# Patient Record
Sex: Male | Born: 1940
Health system: Southern US, Community
[De-identification: ages and names within clinical notes are randomized; demographics above are authoritative.]

## PROBLEM LIST (undated history)

## (undated) DIAGNOSIS — I509 Heart failure, unspecified: Secondary | ICD-10-CM

## (undated) DIAGNOSIS — F329 Major depressive disorder, single episode, unspecified: Secondary | ICD-10-CM

## (undated) DIAGNOSIS — B019 Varicella without complication: Secondary | ICD-10-CM

## (undated) DIAGNOSIS — I10 Essential (primary) hypertension: Secondary | ICD-10-CM

## (undated) DIAGNOSIS — Z9189 Other specified personal risk factors, not elsewhere classified: Secondary | ICD-10-CM

## (undated) DIAGNOSIS — R32 Unspecified urinary incontinence: Secondary | ICD-10-CM

## (undated) DIAGNOSIS — E785 Hyperlipidemia, unspecified: Secondary | ICD-10-CM

## (undated) DIAGNOSIS — M1990 Unspecified osteoarthritis, unspecified site: Secondary | ICD-10-CM

## (undated) DIAGNOSIS — G709 Myoneural disorder, unspecified: Secondary | ICD-10-CM

## (undated) DIAGNOSIS — R011 Cardiac murmur, unspecified: Secondary | ICD-10-CM

## (undated) DIAGNOSIS — I350 Nonrheumatic aortic (valve) stenosis: Secondary | ICD-10-CM

## (undated) DIAGNOSIS — E119 Type 2 diabetes mellitus without complications: Secondary | ICD-10-CM

## (undated) DIAGNOSIS — F32A Depression, unspecified: Secondary | ICD-10-CM

## (undated) HISTORY — DX: Major depressive disorder, single episode, unspecified: F32.9

## (undated) HISTORY — DX: Unspecified urinary incontinence: R32

## (undated) HISTORY — DX: Depression, unspecified: F32.A

## (undated) HISTORY — DX: Myoneural disorder, unspecified: G70.9

## (undated) HISTORY — DX: Type 2 diabetes mellitus without complications: E11.9

## (undated) HISTORY — DX: Unspecified osteoarthritis, unspecified site: M19.90

## (undated) HISTORY — DX: Hyperlipidemia, unspecified: E78.5

## (undated) HISTORY — DX: Other specified personal risk factors, not elsewhere classified: Z91.89

## (undated) HISTORY — DX: Essential (primary) hypertension: I10

## (undated) HISTORY — DX: Varicella without complication: B01.9

## (undated) HISTORY — DX: Cardiac murmur, unspecified: R01.1

---

## 2006-03-15 HISTORY — PX: OTHER SURGICAL HISTORY: SHX169

## 2013-03-09 ENCOUNTER — Emergency Department: Payer: Self-pay | Admitting: Emergency Medicine

## 2013-05-31 ENCOUNTER — Telehealth: Payer: Self-pay

## 2013-05-31 NOTE — Telephone Encounter (Signed)
I typically don't prescribe oxycodone. I would refer him to pain management if he wanted to see me

## 2013-05-31 NOTE — Telephone Encounter (Signed)
Pt left v/m; pt has scheduled new pt appt on 06/19/13 with Nicki Reaperegina Baity NP and pt wants to make sure that she will prescribe oxycodone; pt has been on oxycodone for 7 years for ruptured disc in back, pain in lt leg and the doctor that used to prescribe med is too far for pt to travel to. Pt  Request cb.

## 2013-06-01 NOTE — Telephone Encounter (Signed)
I have to see him before I can refer him

## 2013-06-01 NOTE — Telephone Encounter (Signed)
Pt states that he would like a referral and I did let pt know that someone will be calling him to make appt

## 2013-06-01 NOTE — Telephone Encounter (Signed)
Left message on voicemail for pt to return call  

## 2013-06-16 ENCOUNTER — Emergency Department: Payer: Self-pay | Admitting: Emergency Medicine

## 2013-06-19 ENCOUNTER — Ambulatory Visit: Payer: Self-pay | Admitting: Internal Medicine

## 2013-09-12 ENCOUNTER — Emergency Department: Payer: Self-pay | Admitting: Emergency Medicine

## 2013-09-12 LAB — URINALYSIS, COMPLETE
BILIRUBIN, UR: NEGATIVE
Bacteria: NONE SEEN
Blood: NEGATIVE
Glucose,UR: NEGATIVE mg/dL (ref 0–75)
Hyaline Cast: 2
Leukocyte Esterase: NEGATIVE
Nitrite: NEGATIVE
PH: 5 (ref 4.5–8.0)
Protein: 30
SPECIFIC GRAVITY: 1.026 (ref 1.003–1.030)
Squamous Epithelial: 1

## 2013-09-12 LAB — COMPREHENSIVE METABOLIC PANEL
Albumin: 4.2 g/dL (ref 3.4–5.0)
Alkaline Phosphatase: 75 U/L
Anion Gap: 8 (ref 7–16)
BUN: 15 mg/dL (ref 7–18)
Bilirubin,Total: 0.6 mg/dL (ref 0.2–1.0)
CO2: 27 mmol/L (ref 21–32)
Calcium, Total: 9.4 mg/dL (ref 8.5–10.1)
Chloride: 105 mmol/L (ref 98–107)
Creatinine: 1.01 mg/dL (ref 0.60–1.30)
EGFR (Non-African Amer.): >60
Glucose: 144 mg/dL — ABNORMAL HIGH (ref 65–99)
Osmolality: 283 (ref 275–301)
Potassium: 3.6 mmol/L (ref 3.5–5.1)
SGOT(AST): 35 U/L (ref 15–37)
SGPT (ALT): 26 U/L (ref 12–78)
SODIUM: 140 mmol/L (ref 136–145)
TOTAL PROTEIN: 8.2 g/dL (ref 6.4–8.2)

## 2013-09-12 LAB — CBC
HCT: 38 % — ABNORMAL LOW (ref 40.0–52.0)
HGB: 12.8 g/dL — AB (ref 13.0–18.0)
MCH: 30.6 pg (ref 26.0–34.0)
MCHC: 33.7 g/dL (ref 32.0–36.0)
MCV: 91 fL (ref 80–100)
Platelet: 160 10*3/uL (ref 150–440)
RBC: 4.19 10*6/uL — ABNORMAL LOW (ref 4.40–5.90)
RDW: 14 % (ref 11.5–14.5)
WBC: 8.5 10*3/uL (ref 3.8–10.6)

## 2013-09-12 LAB — TROPONIN I: Troponin-I: 0.05 ng/mL

## 2013-09-12 LAB — PROTIME-INR
INR: 0.9
PROTHROMBIN TIME: 12.5 s (ref 11.5–14.7)

## 2013-09-12 LAB — CK: CK, TOTAL: 270 U/L

## 2013-09-28 ENCOUNTER — Encounter: Payer: Self-pay | Admitting: Internal Medicine

## 2013-09-28 ENCOUNTER — Ambulatory Visit (INDEPENDENT_AMBULATORY_CARE_PROVIDER_SITE_OTHER): Payer: Medicare HMO | Admitting: Internal Medicine

## 2013-09-28 VITALS — BP 132/84 | HR 76 | Temp 97.5°F | Ht 70.0 in | Wt 256.2 lb

## 2013-09-28 DIAGNOSIS — G3184 Mild cognitive impairment, so stated: Secondary | ICD-10-CM | POA: Insufficient documentation

## 2013-09-28 DIAGNOSIS — I10 Essential (primary) hypertension: Secondary | ICD-10-CM | POA: Insufficient documentation

## 2013-09-28 DIAGNOSIS — E785 Hyperlipidemia, unspecified: Secondary | ICD-10-CM | POA: Insufficient documentation

## 2013-09-28 DIAGNOSIS — K219 Gastro-esophageal reflux disease without esophagitis: Secondary | ICD-10-CM | POA: Insufficient documentation

## 2013-09-28 DIAGNOSIS — G8929 Other chronic pain: Secondary | ICD-10-CM | POA: Insufficient documentation

## 2013-09-28 DIAGNOSIS — M549 Dorsalgia, unspecified: Secondary | ICD-10-CM

## 2013-09-28 DIAGNOSIS — E1129 Type 2 diabetes mellitus with other diabetic kidney complication: Secondary | ICD-10-CM | POA: Insufficient documentation

## 2013-09-28 DIAGNOSIS — E119 Type 2 diabetes mellitus without complications: Secondary | ICD-10-CM

## 2013-09-28 MED ORDER — OXYCODONE HCL 10 MG PO TABS: 10.0000 mg | ORAL_TABLET | Freq: Three times a day (TID) | ORAL | Status: DC

## 2013-09-28 NOTE — Assessment & Plan Note (Signed)
On aricept Would like to get records from previous provider to review before changing medications

## 2013-09-28 NOTE — Assessment & Plan Note (Signed)
No issues on prilosec 

## 2013-09-28 NOTE — Progress Notes (Signed)
HPI                            Pt presents to the clinic today to establish care. He is transferring care but can't remember the name of the practice. He reports that he is transferring care because he no longer drives and he needs to be closer to the place where he gets his pain medication from. He reports he has been taking the oxycodone for the last 7 years for his back and his knees. He is out and does need a refill today.  Additionally, he reports to me that he does have diabetes but that it is diet controlled. However, he is on Metformin BID. He also reports that no one has ever told him that he has high blood pressure or high cholesterol, despite taking multiple medications for the two above conditions.   His biggest concern today is his memory impairment. It seems to be getting worse. He is aricept daily. He reports one doctor told him he had alzheimer's, one doctor told him he did not. He is not sure why his memory is so bad at his age. He would like some clearer answers.   Past Medical History  Diagnosis Date  . Arthritis   . Chicken pox   . Diabetes mellitus without complication   . Heart murmur   . Hypertension   . Hyperlipidemia   . Urinary incontinence     Current Outpatient Prescriptions  Medication Sig Dispense Refill  . amLODipine (NORVASC) 5 MG tablet Take 5 mg by mouth daily.      Marland Kitchen B-Complex CAPS Take 1 capsule by mouth daily.      Marland Kitchen donepezil (ARICEPT) 10 MG tablet Take 10 mg by mouth at bedtime.      . gabapentin (NEURONTIN) 400 MG capsule Take 400 mg by mouth 3 (three) times daily.      Marland Kitchen losartan (COZAAR) 50 MG tablet Take 50 mg by mouth daily.      . metFORMIN (GLUCOPHAGE) 1000 MG tablet Take 1,000 mg by mouth 2 (two) times daily with a meal.      . metoprolol succinate (TOPROL-XL) 50 MG 24 hr tablet Take 50 mg by mouth daily. Take with or immediately following a meal.      . Multiple Vitamin (MULTIVITAMIN) capsule Take 1 capsule by mouth daily.      Marland Kitchen  omeprazole (PRILOSEC) 20 MG capsule Take 20 mg by mouth daily.      . Oxycodone HCl 10 MG TABS Take 10 mg by mouth 3 (three) times daily.      . simvastatin (ZOCOR) 20 MG tablet Take 20 mg by mouth daily.      Marland Kitchen venlafaxine XR (EFFEXOR-XR) 150 MG 24 hr capsule Take 150 mg by mouth daily with breakfast.      . furosemide (LASIX) 40 MG tablet Take 40 mg by mouth.       No current facility-administered medications for this visit.    No Known Allergies  Family History  Problem Relation Age of Onset  . Alcohol abuse Mother   . Cancer Mother   . Heart disease Mother   . Diabetes Mother   . Alcohol abuse Father   . Cancer Father   . Heart disease Father   . Diabetes Father     History   Social History  . Marital Status: Single    Spouse Name: N/A    Number  of Children: N/A  . Years of Education: N/A   Occupational History  . Not on file.   Social History Main Topics  . Smoking status: Never Smoker   . Smokeless tobacco: Not on file  . Alcohol Use: No  . Drug Use: No  . Sexual Activity: No   Other Topics Concern  . Not on file   Social History Narrative  . No narrative on file    ROS:  Constitutional: Pt reports fatigue. Denies fever, malaise,  headache or abrupt weight changes.  Respiratory: Denies difficulty breathing, shortness of breath, cough or sputum production.   Cardiovascular: Denies chest pain, chest tightness, palpitations or swelling in the hands or feet.  Musculoskeletal: Pt reports chronic back pain, left knee pain. Denies decrease in range of motion, difficulty with gait, muscle pain.   Neurological: Pt reports difficulty with memory. Denies dizziness, difficulty with speech or problems with balance and coordination.   No other specific complaints in a complete review of systems (except as listed in HPI above).  PE:  BP 132/84  Pulse 76  Temp(Src) 97.5 F (36.4 C) (Oral)  Ht 5\' 10"  (1.778 m)  Wt 256 lb 4 oz (116.234 kg)  BMI 36.77 kg/m2  SpO2  93% Wt Readings from Last 3 Encounters:  09/28/13 256 lb 4 oz (116.234 kg)    General: Appears his stated age,  in NAD. Cardiovascular: Normal rate and rhythm. S1,S2 noted.  No murmur, rubs or gallops noted. No JVD or BLE edema. No carotid bruits noted. Pulmonary/Chest: Normal effort and positive vesicular breath sounds. No respiratory distress. No wheezes, rales or ronchi noted.  Neurological: Alert and oriented.  Psychiatric: Mood and affect normal. Behavior is normal. Judgment and thought content normal.     Assessment and Plan:

## 2013-09-28 NOTE — Assessment & Plan Note (Signed)
He reports this is diet controlled He recently had labs drawn by previous provider Will request records

## 2013-09-28 NOTE — Assessment & Plan Note (Signed)
We spent most of the visit talking about this I explained to him that I do not do chronic pain management I would be happy to refer him to pain managament. He reports that he does not have any transportation to get there. He would like to see if any other provider in this office would take him on as a patient- I advised him that I would try to facilitate this.

## 2013-09-28 NOTE — Assessment & Plan Note (Signed)
Well controlled on current therapy Will request records from previous provider

## 2013-09-28 NOTE — Progress Notes (Signed)
Pre visit review using our clinic review tool, if applicable. No additional management support is needed unless otherwise documented below in the visit note. 

## 2013-09-28 NOTE — Patient Instructions (Addendum)

## 2013-09-28 NOTE — Assessment & Plan Note (Signed)
No issues on zocor He reports that he recently had blood work- will request from previous provider

## 2013-10-03 ENCOUNTER — Other Ambulatory Visit: Payer: Self-pay | Admitting: Internal Medicine

## 2013-10-03 ENCOUNTER — Telehealth: Payer: Self-pay | Admitting: Internal Medicine

## 2013-10-03 DIAGNOSIS — M549 Dorsalgia, unspecified: Principal | ICD-10-CM

## 2013-10-03 DIAGNOSIS — G8929 Other chronic pain: Secondary | ICD-10-CM

## 2013-10-03 NOTE — Telephone Encounter (Signed)
Pt is aware that he will need to be referred to pain mgmt--pt is okay with that and states he would like to stay as close to this area as possible due to him not having transportation

## 2013-10-03 NOTE — Telephone Encounter (Signed)
Pt says you were trying to find a dr w/in our practice that would prescribe his Oxycodone since you normally wouldn't prescribe them.  He's calling to check the status of that. Could you please call patient back to advise? Thank you.

## 2013-10-03 NOTE — Telephone Encounter (Signed)
Referral placed.

## 2013-10-04 ENCOUNTER — Telehealth: Payer: Self-pay | Admitting: Internal Medicine

## 2013-10-04 NOTE — Telephone Encounter (Signed)
Pt called back and states that he is out of all the rest of his medications and was unclear why he had to be referred to a pain management clinic.  He wants to only go to a PCP for his medication refills.  Pt was also questioning whether we had received his records from previous PCP.  Best number to call pt is [phone]

## 2013-10-04 NOTE — Telephone Encounter (Signed)
Spoke to pt letting him know referral had been placed for pain mgmt--pt asked why he had to have more than 1 doctor--i explained to pt prescribing controlled substances are up to the discretion of provider-- pt asked about refills on his other medications and what he needed to do to get those filled--I said no problem and asked what he would like refilled--he started to ask if he needed to even be on the medications--i advised pt that i was not qualified to give that information and he would need to f/u with Rene Kocheregina to discuss--pt also was inquiring about his medical records--i told patient i would look into that and get back to him--in the middle of me explaining what i will check on and getting back to him, he interrupted and said "Will you just shut up and listen". I went on to tell pt that i would request for his medications to be filled and take care of that and give him a call--pt stated he was not sure what he needed refills on--I asked patient if he would call back and let me know what was needed--pt proceeded to interrupt and said "shut up and listen, do not interrupt me" pt then went on to tell me that i better call [facility] confirm, i told pt that i would be able to do that but suggested he maybe able to get to it sooner than i would if he was needing medications soon as i still had patients at this time--pt said "that is your job to do that and i will not, you can do it now and call me back" i told patient that i currently had a pt and that i would get to it as soon as i could and call him back--pt then went on to say "you are ridiculous, and Dr Sampson SiBaity has not been helpful. I cannot have all of these doctors" i then told patient that i would check into all we had discussed and call him back as soon as i could get all of the information requested and refill medications

## 2013-10-04 NOTE — Telephone Encounter (Signed)
Peter Starlingdrienne- This is the patient that I was talking to you about the pain management referral. No one in the office accepted him as a patient. He is being very rude to the staff. I don't what your thoughts are about handling his behavior.

## 2013-10-05 NOTE — Telephone Encounter (Signed)
Pt called back this morning around 11:00 asking if his medications have been refilled yet. After looking in chart, reading over notes, and asking Shawna OrleansMelanie if she has had time to call [employer] yet, I reccommended pt call [facility] and have them request any medications. I gave pt our fax number and explained any refills may take 48-72 hours to be completed. Pt asked if he needed to make a follow up with Rene Kocheregina because his oxycodone needs to be refilled at the beginning of every month. Pt was very frustrated because at his initial visit he gave all of the names of his medications to us and why should he have to tell us what he needs when we have it on file. At the end of the conversation he stated this was a "crappy company" and he will just find another company to deal with then hung up.

## 2013-10-05 NOTE — Telephone Encounter (Signed)
Great... I agree that he should be seen elsewhere

## 2013-10-08 ENCOUNTER — Encounter: Payer: Self-pay | Admitting: Internal Medicine

## 2013-10-08 NOTE — Telephone Encounter (Signed)
Pt called asking about his referral to pain management. I told him when I spoke with him on Friday, before he hung up he stated he was going to find another doctors office that would handle his medications so I was confused to why he was calling back. He replied "Oh, so you're the one I don't like b/c you never called me back?" I told him I wasn't sure if he didn't like me but we would call him soon about everything b/c I wasn't sure about his plan of care here at [facility]. All the patient wants is a general practitioner who can prescribe him all of his meds. He didn't want to go back to [facility] b/c it is 25 minutes away and he doesn't have a vehicle and   [phone]

## 2013-10-09 ENCOUNTER — Telehealth: Payer: Self-pay | Admitting: Internal Medicine

## 2013-10-09 NOTE — Telephone Encounter (Signed)
Patient dismissed [facility]C, effective 10/08/13. Dismissal letter sent out by certified / registered mail. rmf

## 2013-10-11 ENCOUNTER — Ambulatory Visit: Payer: Self-pay | Admitting: Internal Medicine

## 2013-10-11 ENCOUNTER — Ambulatory Visit: Payer: Self-pay | Admitting: Adult Health

## 2013-10-15 NOTE — Telephone Encounter (Signed)
°  Received signed domestic return receipt verifying delivery of certified letter on October 15, 2013. Article number [id number]

## 2014-03-15 HISTORY — PX: CARDIAC CATHETERIZATION: SHX172

## 2014-03-19 ENCOUNTER — Inpatient Hospital Stay: Payer: Self-pay | Admitting: Specialist

## 2014-03-19 DIAGNOSIS — F329 Major depressive disorder, single episode, unspecified: Secondary | ICD-10-CM | POA: Diagnosis not present

## 2014-03-19 DIAGNOSIS — M6281 Muscle weakness (generalized): Secondary | ICD-10-CM | POA: Diagnosis not present

## 2014-03-19 DIAGNOSIS — R296 Repeated falls: Secondary | ICD-10-CM | POA: Diagnosis not present

## 2014-03-19 DIAGNOSIS — I517 Cardiomegaly: Secondary | ICD-10-CM | POA: Diagnosis not present

## 2014-03-19 DIAGNOSIS — G3184 Mild cognitive impairment, so stated: Secondary | ICD-10-CM | POA: Diagnosis not present

## 2014-03-19 DIAGNOSIS — R4182 Altered mental status, unspecified: Secondary | ICD-10-CM | POA: Diagnosis not present

## 2014-03-19 DIAGNOSIS — I482 Chronic atrial fibrillation: Secondary | ICD-10-CM | POA: Diagnosis not present

## 2014-03-19 DIAGNOSIS — I10 Essential (primary) hypertension: Secondary | ICD-10-CM | POA: Diagnosis not present

## 2014-03-19 DIAGNOSIS — I4891 Unspecified atrial fibrillation: Secondary | ICD-10-CM | POA: Diagnosis not present

## 2014-03-19 DIAGNOSIS — E785 Hyperlipidemia, unspecified: Secondary | ICD-10-CM | POA: Diagnosis not present

## 2014-03-19 DIAGNOSIS — F1910 Other psychoactive substance abuse, uncomplicated: Secondary | ICD-10-CM | POA: Diagnosis not present

## 2014-03-19 DIAGNOSIS — I214 Non-ST elevation (NSTEMI) myocardial infarction: Secondary | ICD-10-CM | POA: Diagnosis not present

## 2014-03-19 DIAGNOSIS — G8929 Other chronic pain: Secondary | ICD-10-CM | POA: Diagnosis not present

## 2014-03-19 DIAGNOSIS — R778 Other specified abnormalities of plasma proteins: Secondary | ICD-10-CM | POA: Diagnosis not present

## 2014-03-19 DIAGNOSIS — I248 Other forms of acute ischemic heart disease: Secondary | ICD-10-CM | POA: Diagnosis not present

## 2014-03-19 DIAGNOSIS — Z8249 Family history of ischemic heart disease and other diseases of the circulatory system: Secondary | ICD-10-CM | POA: Diagnosis not present

## 2014-03-19 DIAGNOSIS — I350 Nonrheumatic aortic (valve) stenosis: Secondary | ICD-10-CM | POA: Diagnosis not present

## 2014-03-19 DIAGNOSIS — Z9889 Other specified postprocedural states: Secondary | ICD-10-CM | POA: Diagnosis not present

## 2014-03-19 DIAGNOSIS — M545 Low back pain: Secondary | ICD-10-CM | POA: Diagnosis not present

## 2014-03-19 DIAGNOSIS — Z720 Tobacco use: Secondary | ICD-10-CM | POA: Diagnosis not present

## 2014-03-19 DIAGNOSIS — K219 Gastro-esophageal reflux disease without esophagitis: Secondary | ICD-10-CM | POA: Diagnosis not present

## 2014-03-19 DIAGNOSIS — E119 Type 2 diabetes mellitus without complications: Secondary | ICD-10-CM | POA: Diagnosis not present

## 2014-03-19 DIAGNOSIS — F0390 Unspecified dementia without behavioral disturbance: Secondary | ICD-10-CM | POA: Diagnosis not present

## 2014-03-19 DIAGNOSIS — Z79899 Other long term (current) drug therapy: Secondary | ICD-10-CM | POA: Diagnosis not present

## 2014-03-19 LAB — TROPONIN I
TROPONIN-I: 0.14 ng/mL — AB
TROPONIN-I: 0.11 ng/mL — AB
Troponin-I: 0.12 ng/mL — ABNORMAL HIGH

## 2014-03-19 LAB — COMPREHENSIVE METABOLIC PANEL
ALK PHOS: 71 U/L
ALT: 21 U/L
Albumin: 3.6 g/dL (ref 3.4–5.0)
Anion Gap: 4 — ABNORMAL LOW (ref 7–16)
BUN: 17 mg/dL (ref 7–18)
Bilirubin,Total: 0.6 mg/dL (ref 0.2–1.0)
CO2: 31 mmol/L (ref 21–32)
Calcium, Total: 8.8 mg/dL (ref 8.5–10.1)
Chloride: 102 mmol/L (ref 98–107)
Creatinine: 1.37 mg/dL — ABNORMAL HIGH (ref 0.60–1.30)
EGFR (African American): >60
GFR CALC NON AF AMER: 54 — AB
Glucose: 164 mg/dL — ABNORMAL HIGH (ref 65–99)
OSMOLALITY: 279 (ref 275–301)
Potassium: 4.3 mmol/L (ref 3.5–5.1)
SGOT(AST): 16 U/L (ref 15–37)
SODIUM: 137 mmol/L (ref 136–145)
Total Protein: 7 g/dL (ref 6.4–8.2)

## 2014-03-19 LAB — DRUG SCREEN, URINE
Amphetamines, Ur Screen: NEGATIVE (ref ?–1000)
BARBITURATES, UR SCREEN: NEGATIVE (ref ?–200)
Benzodiazepine, Ur Scrn: NEGATIVE (ref ?–200)
CANNABINOID 50 NG, UR ~~LOC~~: NEGATIVE (ref ?–50)
Cocaine Metabolite,Ur ~~LOC~~: NEGATIVE (ref ?–300)
MDMA (Ecstasy)Ur Screen: NEGATIVE (ref ?–500)
Methadone, Ur Screen: NEGATIVE (ref ?–300)
Opiate, Ur Screen: POSITIVE (ref ?–300)
Phencyclidine (PCP) Ur S: NEGATIVE (ref ?–25)
TRICYCLIC, UR SCREEN: NEGATIVE (ref ?–1000)

## 2014-03-19 LAB — CBC
HCT: 34.7 % — ABNORMAL LOW (ref 40.0–52.0)
HGB: 11.4 g/dL — ABNORMAL LOW (ref 13.0–18.0)
MCH: 30.3 pg (ref 26.0–34.0)
MCHC: 32.9 g/dL (ref 32.0–36.0)
MCV: 92 fL (ref 80–100)
PLATELETS: 191 10*3/uL (ref 150–440)
RBC: 3.76 10*6/uL — ABNORMAL LOW (ref 4.40–5.90)
RDW: 14.9 % — ABNORMAL HIGH (ref 11.5–14.5)
WBC: 7.7 10*3/uL (ref 3.8–10.6)

## 2014-03-19 LAB — URINALYSIS, COMPLETE
BACTERIA: NONE SEEN
Bilirubin,UR: NEGATIVE
GLUCOSE, UR: NEGATIVE mg/dL (ref 0–75)
Hyaline Cast: 17
Ketone: NEGATIVE
Leukocyte Esterase: NEGATIVE
Nitrite: NEGATIVE
PROTEIN: NEGATIVE
Ph: 5 (ref 4.5–8.0)
RBC,UR: 1 /HPF (ref 0–5)
SPECIFIC GRAVITY: 1.009 (ref 1.003–1.030)
Squamous Epithelial: <1
WBC UR: <1 /HPF (ref 0–5)

## 2014-03-19 LAB — PROTIME-INR
INR: 1
PROTHROMBIN TIME: 12.9 s (ref 11.5–14.7)

## 2014-03-19 LAB — CK TOTAL AND CKMB (NOT AT ARMC)
CK, TOTAL: 217 U/L (ref 39–308)
CK, Total: 249 U/L (ref 39–308)
CK-MB: 3.5 ng/mL (ref 0.5–3.6)
CK-MB: 3.9 ng/mL — AB (ref 0.5–3.6)

## 2014-03-19 LAB — HEMOGLOBIN A1C: Hemoglobin A1C: 6.7 % — ABNORMAL HIGH (ref 4.2–6.3)

## 2014-03-19 LAB — LIPID PANEL
Cholesterol: 164 mg/dL (ref 0–200)
HDL Cholesterol: 55 mg/dL (ref 40–60)
Ldl Cholesterol, Calc: 94 mg/dL (ref 0–100)
Triglycerides: 77 mg/dL (ref 0–200)
VLDL CHOLESTEROL, CALC: 15 mg/dL (ref 5–40)

## 2014-03-19 LAB — CK: CK, Total: 163 U/L (ref 39–308)

## 2014-03-19 LAB — CK-MB: CK-MB: 2.2 ng/mL (ref 0.5–3.6)

## 2014-03-19 LAB — MAGNESIUM: MAGNESIUM: 1.8 mg/dL

## 2014-03-19 LAB — TSH: THYROID STIMULATING HORM: 1.27 u[IU]/mL

## 2014-03-20 LAB — CBC WITH DIFFERENTIAL/PLATELET
BASOS ABS: 0 10*3/uL (ref 0.0–0.1)
BASOS PCT: 0.4 %
Eosinophil #: 0.1 10*3/uL (ref 0.0–0.7)
Eosinophil %: 1.5 %
HCT: 32.7 % — AB (ref 40.0–52.0)
HGB: 10.6 g/dL — AB (ref 13.0–18.0)
LYMPHS ABS: 0.7 10*3/uL — AB (ref 1.0–3.6)
Lymphocyte %: 7.9 %
MCH: 30.2 pg (ref 26.0–34.0)
MCHC: 32.5 g/dL (ref 32.0–36.0)
MCV: 93 fL (ref 80–100)
MONOS PCT: 7.3 %
Monocyte #: 0.6 x10 3/mm (ref 0.2–1.0)
NEUTROS ABS: 6.9 10*3/uL — AB (ref 1.4–6.5)
Neutrophil %: 82.9 %
Platelet: 162 10*3/uL (ref 150–440)
RBC: 3.52 10*6/uL — ABNORMAL LOW (ref 4.40–5.90)
RDW: 14.3 % (ref 11.5–14.5)
WBC: 8.3 10*3/uL (ref 3.8–10.6)

## 2014-03-20 LAB — BASIC METABOLIC PANEL
ANION GAP: 9 (ref 7–16)
BUN: 20 mg/dL — AB (ref 7–18)
CO2: 27 mmol/L (ref 21–32)
CREATININE: 0.97 mg/dL (ref 0.60–1.30)
Calcium, Total: 8.6 mg/dL (ref 8.5–10.1)
Chloride: 103 mmol/L (ref 98–107)
Glucose: 161 mg/dL — ABNORMAL HIGH (ref 65–99)
Osmolality: 284 (ref 275–301)
Potassium: 4.1 mmol/L (ref 3.5–5.1)
SODIUM: 139 mmol/L (ref 136–145)

## 2014-03-21 ENCOUNTER — Ambulatory Visit (INDEPENDENT_AMBULATORY_CARE_PROVIDER_SITE_OTHER): Payer: Medicare Other | Admitting: Nurse Practitioner

## 2014-03-21 ENCOUNTER — Encounter: Payer: Self-pay | Admitting: Nurse Practitioner

## 2014-03-21 ENCOUNTER — Inpatient Hospital Stay: Payer: Self-pay | Admitting: Specialist

## 2014-03-21 VITALS — BP 85/65 | HR 86 | Temp 97.9°F | Resp 14 | Ht 67.0 in | Wt 256.0 lb

## 2014-03-21 DIAGNOSIS — I4891 Unspecified atrial fibrillation: Secondary | ICD-10-CM | POA: Diagnosis not present

## 2014-03-21 DIAGNOSIS — F329 Major depressive disorder, single episode, unspecified: Secondary | ICD-10-CM | POA: Diagnosis not present

## 2014-03-21 DIAGNOSIS — I959 Hypotension, unspecified: Secondary | ICD-10-CM

## 2014-03-21 DIAGNOSIS — R0602 Shortness of breath: Secondary | ICD-10-CM | POA: Diagnosis not present

## 2014-03-21 DIAGNOSIS — R5383 Other fatigue: Secondary | ICD-10-CM | POA: Diagnosis not present

## 2014-03-21 DIAGNOSIS — G8929 Other chronic pain: Secondary | ICD-10-CM | POA: Diagnosis not present

## 2014-03-21 DIAGNOSIS — F4323 Adjustment disorder with mixed anxiety and depressed mood: Secondary | ICD-10-CM

## 2014-03-21 DIAGNOSIS — E785 Hyperlipidemia, unspecified: Secondary | ICD-10-CM | POA: Diagnosis not present

## 2014-03-21 DIAGNOSIS — F17290 Nicotine dependence, other tobacco product, uncomplicated: Secondary | ICD-10-CM | POA: Diagnosis not present

## 2014-03-21 DIAGNOSIS — R7989 Other specified abnormal findings of blood chemistry: Secondary | ICD-10-CM | POA: Diagnosis not present

## 2014-03-21 DIAGNOSIS — F0390 Unspecified dementia without behavioral disturbance: Secondary | ICD-10-CM | POA: Diagnosis not present

## 2014-03-21 DIAGNOSIS — R42 Dizziness and giddiness: Secondary | ICD-10-CM | POA: Diagnosis not present

## 2014-03-21 DIAGNOSIS — E860 Dehydration: Secondary | ICD-10-CM | POA: Diagnosis not present

## 2014-03-21 DIAGNOSIS — I9589 Other hypotension: Secondary | ICD-10-CM | POA: Diagnosis not present

## 2014-03-21 DIAGNOSIS — I350 Nonrheumatic aortic (valve) stenosis: Secondary | ICD-10-CM | POA: Diagnosis not present

## 2014-03-21 DIAGNOSIS — M545 Low back pain: Secondary | ICD-10-CM | POA: Diagnosis not present

## 2014-03-21 DIAGNOSIS — I10 Essential (primary) hypertension: Secondary | ICD-10-CM | POA: Diagnosis not present

## 2014-03-21 DIAGNOSIS — G3184 Mild cognitive impairment, so stated: Secondary | ICD-10-CM | POA: Diagnosis not present

## 2014-03-21 DIAGNOSIS — N179 Acute kidney failure, unspecified: Secondary | ICD-10-CM | POA: Diagnosis not present

## 2014-03-21 DIAGNOSIS — N289 Disorder of kidney and ureter, unspecified: Secondary | ICD-10-CM | POA: Diagnosis not present

## 2014-03-21 DIAGNOSIS — K219 Gastro-esophageal reflux disease without esophagitis: Secondary | ICD-10-CM | POA: Diagnosis not present

## 2014-03-21 DIAGNOSIS — I2510 Atherosclerotic heart disease of native coronary artery without angina pectoris: Secondary | ICD-10-CM | POA: Diagnosis not present

## 2014-03-21 DIAGNOSIS — E119 Type 2 diabetes mellitus without complications: Secondary | ICD-10-CM | POA: Diagnosis not present

## 2014-03-21 LAB — BASIC METABOLIC PANEL
Anion Gap: 8 (ref 7–16)
BUN: 26 mg/dL — AB (ref 7–18)
Calcium, Total: 8.1 mg/dL — ABNORMAL LOW (ref 8.5–10.1)
Chloride: 102 mmol/L (ref 98–107)
Co2: 27 mmol/L (ref 21–32)
Creatinine: 1.46 mg/dL — ABNORMAL HIGH (ref 0.60–1.30)
EGFR (African American): >60
GFR CALC NON AF AMER: 51 — AB
Glucose: 158 mg/dL — ABNORMAL HIGH (ref 65–99)
OSMOLALITY: 282 (ref 275–301)
POTASSIUM: 4 mmol/L (ref 3.5–5.1)
Sodium: 137 mmol/L (ref 136–145)

## 2014-03-21 LAB — CBC WITH DIFFERENTIAL/PLATELET
BASOS ABS: 0 10*3/uL (ref 0.0–0.1)
BASOS PCT: 0.7 %
EOS ABS: 0.2 10*3/uL (ref 0.0–0.7)
EOS PCT: 3.1 %
HCT: 31.2 % — AB (ref 40.0–52.0)
HGB: 9.9 g/dL — AB (ref 13.0–18.0)
LYMPHS PCT: 19.1 %
Lymphocyte #: 1.3 10*3/uL (ref 1.0–3.6)
MCH: 29.8 pg (ref 26.0–34.0)
MCHC: 31.7 g/dL — AB (ref 32.0–36.0)
MCV: 94 fL (ref 80–100)
MONO ABS: 0.5 x10 3/mm (ref 0.2–1.0)
Monocyte %: 8.2 %
NEUTROS ABS: 4.6 10*3/uL (ref 1.4–6.5)
Neutrophil %: 68.9 %
Platelet: 164 10*3/uL (ref 150–440)
RBC: 3.32 10*6/uL — AB (ref 4.40–5.90)
RDW: 14.4 % (ref 11.5–14.5)
WBC: 6.6 10*3/uL (ref 3.8–10.6)

## 2014-03-21 LAB — TROPONIN I: TROPONIN-I: 0.08 ng/mL — AB

## 2014-03-21 NOTE — Patient Instructions (Signed)
Please go to the Emergency room for care for you blood pressure.

## 2014-03-21 NOTE — Progress Notes (Signed)
Pre visit review using our clinic review tool, if applicable. No additional management support is needed unless otherwise documented below in the visit note. 

## 2014-03-21 NOTE — Progress Notes (Signed)
Subjective:    Patient ID: Peter Weiss, male    DOB: November 05, 1940, 74 y.o.   MRN: [id number]  HPI  Mr. Cordrey is a 74 yo male with a CC of depression and other concerns. Pt is a poor historian and falling asleep during interview.   1) Dr. Tyler Deis- Pain management   Dr. Brock Bad (spelling?)   45 years of pain, accident when a unit fell on him. Back issues  2) Heart murmurs- was told by ED  3) He has had thoughts of harming himself in past, no plan, no thoughts recently. He feels that he needs to live for his dogs and he misses his family.  4) Diabetic Type II  Light headed today and tired   5) BP does not check daily  Was low yesterday- was given IV drip at hospital   Stopped interview at this point because he was actively falling asleep during our talk. He is a poor historian and he is having flight of ideas.   Review of Systems  Constitutional: Positive for fatigue. Negative for fever, chills, diaphoresis and unexpected weight change.  Eyes: Negative for visual disturbance.  Respiratory: Negative for cough, chest tightness and wheezing.   Cardiovascular: Negative for chest pain, palpitations and leg swelling.  Skin: Negative for rash.  Psychiatric/Behavioral: Positive for sleep disturbance and decreased concentration. Negative for suicidal ideas. The patient is nervous/anxious.        Depressed    Past Medical History  Diagnosis Date  . History of fainting spells of unknown cause   . Diabetes mellitus without complication   . Depression     History   Social History  . Marital Status: Married    Spouse Name: N/A    Number of Children: N/A  . Years of Education: N/A   Occupational History  . Not on file.   Social History Main Topics  . Smoking status: Never Smoker   . Smokeless tobacco: Never Used  . Alcohol Use: No  . Drug Use: No  . Sexual Activity: Not Currently   Other Topics Concern  . Not on file   Social History Narrative  . No narrative on file     Past Surgical History  Procedure Laterality Date  . Goiter  2008    Family History  Problem Relation Age of Onset  . Cancer Mother   . Cancer Father   . Alcohol abuse Father   . Alcohol abuse Brother     No Known Allergies  No current outpatient prescriptions on file prior to visit.   No current facility-administered medications on file prior to visit.       Objective:   Physical Exam  Constitutional: He appears well-developed and well-nourished. No distress.  HENT:  Head: Normocephalic and atraumatic.  Cardiovascular: Normal rate and regular rhythm.   Pulmonary/Chest: Effort normal and breath sounds normal.  Neurological: Coordination normal.  Ambulating with cane, oriented to person, lethargic  Skin: Skin is warm and dry. No rash noted. He is not diaphoretic.  Psychiatric: His mood appears anxious. His affect is inappropriate. His affect is not angry, not blunt and not labile. His speech is delayed. His speech is not rapid and/or pressured, not tangential and not slurred. He is slowed. He is not agitated, not aggressive, not hyperactive, not withdrawn, not actively hallucinating and not combative. Thought content is not paranoid and not delusional. Cognition and memory are impaired. He does not express impulsivity or inappropriate judgment. He exhibits a depressed mood.  He expresses no homicidal and no suicidal ideation. He expresses no suicidal plans and no homicidal plans. He is communicative. He exhibits abnormal recent memory. He exhibits normal remote memory. He is attentive.    BP 85/65 mmHg  Pulse 86  Temp(Src) 97.9 F (36.6 C) (Oral)  Resp 14  Ht 5\' 7"  (1.702 m)  Wt 256 lb (116.121 kg)  BMI 40.09 kg/m2  SpO2 94%     Assessment & Plan:

## 2014-03-22 ENCOUNTER — Telehealth: Payer: Self-pay

## 2014-03-22 DIAGNOSIS — I2510 Atherosclerotic heart disease of native coronary artery without angina pectoris: Secondary | ICD-10-CM

## 2014-03-22 DIAGNOSIS — R7989 Other specified abnormal findings of blood chemistry: Secondary | ICD-10-CM

## 2014-03-22 DIAGNOSIS — I350 Nonrheumatic aortic (valve) stenosis: Secondary | ICD-10-CM

## 2014-03-22 DIAGNOSIS — I4891 Unspecified atrial fibrillation: Secondary | ICD-10-CM

## 2014-03-22 LAB — COMPREHENSIVE METABOLIC PANEL
ANION GAP: 6 — AB (ref 7–16)
Albumin: 2.9 g/dL — ABNORMAL LOW (ref 3.4–5.0)
Alkaline Phosphatase: 61 U/L
BUN: 26 mg/dL — ABNORMAL HIGH (ref 7–18)
Bilirubin,Total: 0.3 mg/dL (ref 0.2–1.0)
CALCIUM: 8.6 mg/dL (ref 8.5–10.1)
CHLORIDE: 106 mmol/L (ref 98–107)
Co2: 28 mmol/L (ref 21–32)
Creatinine: 1.21 mg/dL (ref 0.60–1.30)
EGFR (Non-African Amer.): >60
Glucose: 138 mg/dL — ABNORMAL HIGH (ref 65–99)
OSMOLALITY: 286 (ref 275–301)
POTASSIUM: 4.1 mmol/L (ref 3.5–5.1)
SGOT(AST): 22 U/L (ref 15–37)
SGPT (ALT): 20 U/L
SODIUM: 140 mmol/L (ref 136–145)
TOTAL PROTEIN: 6.2 g/dL — AB (ref 6.4–8.2)

## 2014-03-22 LAB — CBC WITH DIFFERENTIAL/PLATELET
BASOS ABS: 0 10*3/uL (ref 0.0–0.1)
Basophil %: 0.8 %
EOS ABS: 0.3 10*3/uL (ref 0.0–0.7)
Eosinophil %: 4.5 %
HCT: 30.6 % — ABNORMAL LOW (ref 40.0–52.0)
HGB: 10 g/dL — AB (ref 13.0–18.0)
LYMPHS PCT: 20.3 %
Lymphocyte #: 1.2 10*3/uL (ref 1.0–3.6)
MCH: 30.1 pg (ref 26.0–34.0)
MCHC: 32.5 g/dL (ref 32.0–36.0)
MCV: 93 fL (ref 80–100)
MONOS PCT: 9.3 %
Monocyte #: 0.6 x10 3/mm (ref 0.2–1.0)
NEUTROS PCT: 65.1 %
Neutrophil #: 4 10*3/uL (ref 1.4–6.5)
Platelet: 160 10*3/uL (ref 150–440)
RBC: 3.31 10*6/uL — ABNORMAL LOW (ref 4.40–5.90)
RDW: 14.4 % (ref 11.5–14.5)
WBC: 6.1 10*3/uL (ref 3.8–10.6)

## 2014-03-22 LAB — PROTIME-INR
INR: 1
Prothrombin Time: 13 secs (ref 11.5–14.7)

## 2014-03-22 NOTE — Telephone Encounter (Signed)
l mom to call and schedule hosp f./u (TCM./PH) Pt [facility]

## 2014-03-23 DIAGNOSIS — E119 Type 2 diabetes mellitus without complications: Secondary | ICD-10-CM | POA: Diagnosis not present

## 2014-03-23 DIAGNOSIS — M5136 Other intervertebral disc degeneration, lumbar region: Secondary | ICD-10-CM | POA: Diagnosis not present

## 2014-03-23 DIAGNOSIS — R413 Other amnesia: Secondary | ICD-10-CM | POA: Diagnosis not present

## 2014-03-23 DIAGNOSIS — I4891 Unspecified atrial fibrillation: Secondary | ICD-10-CM | POA: Diagnosis not present

## 2014-03-23 DIAGNOSIS — I10 Essential (primary) hypertension: Secondary | ICD-10-CM | POA: Diagnosis not present

## 2014-03-23 DIAGNOSIS — I350 Nonrheumatic aortic (valve) stenosis: Secondary | ICD-10-CM | POA: Diagnosis not present

## 2014-03-24 LAB — CULTURE, BLOOD (SINGLE)

## 2014-03-25 ENCOUNTER — Encounter: Payer: Self-pay | Admitting: Cardiovascular Disease

## 2014-03-25 DIAGNOSIS — F4323 Adjustment disorder with mixed anxiety and depressed mood: Secondary | ICD-10-CM | POA: Insufficient documentation

## 2014-03-25 DIAGNOSIS — I959 Hypotension, unspecified: Secondary | ICD-10-CM | POA: Insufficient documentation

## 2014-03-25 DIAGNOSIS — R5383 Other fatigue: Secondary | ICD-10-CM | POA: Insufficient documentation

## 2014-03-25 NOTE — Assessment & Plan Note (Signed)
Will follow. Stopped assessment for acute issues.

## 2014-03-25 NOTE — Assessment & Plan Note (Signed)
Pt was lethargic and hypotensive, could walk and was driven by a neighbor. He was instructed to be taken to the ED by this neighbor immediately and the neighbor stated that [name] [facility].

## 2014-03-25 NOTE — Assessment & Plan Note (Signed)
Worsening, pt actively nodding off during the interview. Sent to ED for hypotension.

## 2014-03-26 DIAGNOSIS — E119 Type 2 diabetes mellitus without complications: Secondary | ICD-10-CM | POA: Diagnosis not present

## 2014-03-26 DIAGNOSIS — I350 Nonrheumatic aortic (valve) stenosis: Secondary | ICD-10-CM | POA: Diagnosis not present

## 2014-03-26 DIAGNOSIS — R413 Other amnesia: Secondary | ICD-10-CM | POA: Diagnosis not present

## 2014-03-26 DIAGNOSIS — I10 Essential (primary) hypertension: Secondary | ICD-10-CM | POA: Diagnosis not present

## 2014-03-26 DIAGNOSIS — M5136 Other intervertebral disc degeneration, lumbar region: Secondary | ICD-10-CM | POA: Diagnosis not present

## 2014-03-26 DIAGNOSIS — I4891 Unspecified atrial fibrillation: Secondary | ICD-10-CM | POA: Diagnosis not present

## 2014-03-27 ENCOUNTER — Telehealth: Payer: Self-pay

## 2014-03-27 NOTE — Telephone Encounter (Signed)
Received voice mail from Peter Weiss with [facility] with a request for an order for Social Services for [employer].  Fax# [phone].

## 2014-03-27 NOTE — Progress Notes (Signed)
Script written. Please fax.

## 2014-03-27 NOTE — Progress Notes (Signed)
Script faxed.

## 2014-03-28 ENCOUNTER — Encounter: Payer: Self-pay | Admitting: Internal Medicine

## 2014-03-29 DIAGNOSIS — E119 Type 2 diabetes mellitus without complications: Secondary | ICD-10-CM | POA: Diagnosis not present

## 2014-03-29 DIAGNOSIS — I350 Nonrheumatic aortic (valve) stenosis: Secondary | ICD-10-CM | POA: Diagnosis not present

## 2014-03-29 DIAGNOSIS — R413 Other amnesia: Secondary | ICD-10-CM | POA: Diagnosis not present

## 2014-03-29 DIAGNOSIS — M5136 Other intervertebral disc degeneration, lumbar region: Secondary | ICD-10-CM | POA: Diagnosis not present

## 2014-03-29 DIAGNOSIS — I4891 Unspecified atrial fibrillation: Secondary | ICD-10-CM | POA: Diagnosis not present

## 2014-03-29 DIAGNOSIS — I10 Essential (primary) hypertension: Secondary | ICD-10-CM | POA: Diagnosis not present

## 2014-04-02 ENCOUNTER — Encounter: Payer: Self-pay | Admitting: Cardiovascular Disease

## 2014-04-02 DIAGNOSIS — M5136 Other intervertebral disc degeneration, lumbar region: Secondary | ICD-10-CM | POA: Diagnosis not present

## 2014-04-02 DIAGNOSIS — I4891 Unspecified atrial fibrillation: Secondary | ICD-10-CM | POA: Diagnosis not present

## 2014-04-02 DIAGNOSIS — E119 Type 2 diabetes mellitus without complications: Secondary | ICD-10-CM | POA: Diagnosis not present

## 2014-04-02 DIAGNOSIS — R413 Other amnesia: Secondary | ICD-10-CM | POA: Diagnosis not present

## 2014-04-02 DIAGNOSIS — I350 Nonrheumatic aortic (valve) stenosis: Secondary | ICD-10-CM | POA: Diagnosis not present

## 2014-04-02 DIAGNOSIS — I10 Essential (primary) hypertension: Secondary | ICD-10-CM | POA: Diagnosis not present

## 2014-04-04 DIAGNOSIS — I10 Essential (primary) hypertension: Secondary | ICD-10-CM | POA: Diagnosis not present

## 2014-04-04 DIAGNOSIS — M5136 Other intervertebral disc degeneration, lumbar region: Secondary | ICD-10-CM | POA: Diagnosis not present

## 2014-04-04 DIAGNOSIS — R413 Other amnesia: Secondary | ICD-10-CM | POA: Diagnosis not present

## 2014-04-04 DIAGNOSIS — I4891 Unspecified atrial fibrillation: Secondary | ICD-10-CM | POA: Diagnosis not present

## 2014-04-04 DIAGNOSIS — E119 Type 2 diabetes mellitus without complications: Secondary | ICD-10-CM | POA: Diagnosis not present

## 2014-04-04 DIAGNOSIS — I350 Nonrheumatic aortic (valve) stenosis: Secondary | ICD-10-CM | POA: Diagnosis not present

## 2014-04-09 DIAGNOSIS — I350 Nonrheumatic aortic (valve) stenosis: Secondary | ICD-10-CM | POA: Diagnosis not present

## 2014-04-09 DIAGNOSIS — M5136 Other intervertebral disc degeneration, lumbar region: Secondary | ICD-10-CM | POA: Diagnosis not present

## 2014-04-09 DIAGNOSIS — I10 Essential (primary) hypertension: Secondary | ICD-10-CM | POA: Diagnosis not present

## 2014-04-09 DIAGNOSIS — I4891 Unspecified atrial fibrillation: Secondary | ICD-10-CM | POA: Diagnosis not present

## 2014-04-09 DIAGNOSIS — E119 Type 2 diabetes mellitus without complications: Secondary | ICD-10-CM | POA: Diagnosis not present

## 2014-04-09 DIAGNOSIS — R413 Other amnesia: Secondary | ICD-10-CM | POA: Diagnosis not present

## 2014-04-17 ENCOUNTER — Encounter: Payer: Self-pay | Admitting: Cardiovascular Disease

## 2014-04-17 DIAGNOSIS — I10 Essential (primary) hypertension: Secondary | ICD-10-CM | POA: Diagnosis not present

## 2014-04-17 DIAGNOSIS — N400 Enlarged prostate without lower urinary tract symptoms: Secondary | ICD-10-CM | POA: Diagnosis not present

## 2014-04-17 DIAGNOSIS — E119 Type 2 diabetes mellitus without complications: Secondary | ICD-10-CM | POA: Diagnosis not present

## 2014-04-17 DIAGNOSIS — G8929 Other chronic pain: Secondary | ICD-10-CM | POA: Diagnosis not present

## 2014-04-18 ENCOUNTER — Emergency Department (HOSPITAL_COMMUNITY): Payer: Medicare Other

## 2014-04-18 ENCOUNTER — Inpatient Hospital Stay (HOSPITAL_COMMUNITY): Payer: Medicare Other

## 2014-04-18 ENCOUNTER — Encounter (HOSPITAL_COMMUNITY): Payer: Self-pay | Admitting: Emergency Medicine

## 2014-04-18 ENCOUNTER — Inpatient Hospital Stay (HOSPITAL_COMMUNITY)
Admission: EM | Admit: 2014-04-18 | Discharge: 2014-05-02 | DRG: 220 | Disposition: A | Discharge status: Home Health | Payer: Medicare Other | Attending: Thoracic Surgery (Cardiothoracic Vascular Surgery) | Admitting: Thoracic Surgery (Cardiothoracic Vascular Surgery)

## 2014-04-18 DIAGNOSIS — Z452 Encounter for adjustment and management of vascular access device: Secondary | ICD-10-CM | POA: Diagnosis not present

## 2014-04-18 DIAGNOSIS — R0600 Dyspnea, unspecified: Secondary | ICD-10-CM

## 2014-04-18 DIAGNOSIS — I959 Hypotension, unspecified: Secondary | ICD-10-CM

## 2014-04-18 DIAGNOSIS — R509 Fever, unspecified: Secondary | ICD-10-CM

## 2014-04-18 DIAGNOSIS — Z01818 Encounter for other preprocedural examination: Secondary | ICD-10-CM | POA: Diagnosis not present

## 2014-04-18 DIAGNOSIS — E119 Type 2 diabetes mellitus without complications: Secondary | ICD-10-CM | POA: Diagnosis present

## 2014-04-18 DIAGNOSIS — I4891 Unspecified atrial fibrillation: Secondary | ICD-10-CM | POA: Diagnosis not present

## 2014-04-18 DIAGNOSIS — I482 Chronic atrial fibrillation, unspecified: Secondary | ICD-10-CM | POA: Insufficient documentation

## 2014-04-18 DIAGNOSIS — Z9181 History of falling: Secondary | ICD-10-CM | POA: Diagnosis not present

## 2014-04-18 DIAGNOSIS — G8929 Other chronic pain: Secondary | ICD-10-CM | POA: Diagnosis present

## 2014-04-18 DIAGNOSIS — N179 Acute kidney failure, unspecified: Secondary | ICD-10-CM

## 2014-04-18 DIAGNOSIS — E1129 Type 2 diabetes mellitus with other diabetic kidney complication: Secondary | ICD-10-CM

## 2014-04-18 DIAGNOSIS — F0390 Unspecified dementia without behavioral disturbance: Secondary | ICD-10-CM | POA: Diagnosis not present

## 2014-04-18 DIAGNOSIS — I252 Old myocardial infarction: Secondary | ICD-10-CM

## 2014-04-18 DIAGNOSIS — E860 Dehydration: Secondary | ICD-10-CM | POA: Diagnosis not present

## 2014-04-18 DIAGNOSIS — I351 Nonrheumatic aortic (valve) insufficiency: Secondary | ICD-10-CM | POA: Diagnosis not present

## 2014-04-18 DIAGNOSIS — I350 Nonrheumatic aortic (valve) stenosis: Secondary | ICD-10-CM | POA: Diagnosis not present

## 2014-04-18 DIAGNOSIS — R011 Cardiac murmur, unspecified: Secondary | ICD-10-CM | POA: Diagnosis not present

## 2014-04-18 DIAGNOSIS — E785 Hyperlipidemia, unspecified: Secondary | ICD-10-CM | POA: Diagnosis present

## 2014-04-18 DIAGNOSIS — Z9889 Other specified postprocedural states: Secondary | ICD-10-CM | POA: Diagnosis not present

## 2014-04-18 DIAGNOSIS — E8770 Fluid overload, unspecified: Secondary | ICD-10-CM | POA: Diagnosis not present

## 2014-04-18 DIAGNOSIS — R0602 Shortness of breath: Secondary | ICD-10-CM | POA: Diagnosis not present

## 2014-04-18 DIAGNOSIS — K036 Deposits [accretions] on teeth: Secondary | ICD-10-CM | POA: Diagnosis not present

## 2014-04-18 DIAGNOSIS — M1990 Unspecified osteoarthritis, unspecified site: Secondary | ICD-10-CM | POA: Diagnosis not present

## 2014-04-18 DIAGNOSIS — Z952 Presence of prosthetic heart valve: Secondary | ICD-10-CM

## 2014-04-18 DIAGNOSIS — K045 Chronic apical periodontitis: Secondary | ICD-10-CM | POA: Diagnosis present

## 2014-04-18 DIAGNOSIS — R413 Other amnesia: Secondary | ICD-10-CM | POA: Diagnosis not present

## 2014-04-18 DIAGNOSIS — K029 Dental caries, unspecified: Secondary | ICD-10-CM | POA: Diagnosis not present

## 2014-04-18 DIAGNOSIS — R918 Other nonspecific abnormal finding of lung field: Secondary | ICD-10-CM | POA: Diagnosis not present

## 2014-04-18 DIAGNOSIS — J9811 Atelectasis: Secondary | ICD-10-CM | POA: Diagnosis not present

## 2014-04-18 DIAGNOSIS — K0530 Chronic periodontitis, unspecified: Secondary | ICD-10-CM | POA: Diagnosis not present

## 2014-04-18 DIAGNOSIS — Z954 Presence of other heart-valve replacement: Secondary | ICD-10-CM | POA: Diagnosis not present

## 2014-04-18 DIAGNOSIS — I10 Essential (primary) hypertension: Secondary | ICD-10-CM | POA: Diagnosis not present

## 2014-04-18 DIAGNOSIS — I481 Persistent atrial fibrillation: Secondary | ICD-10-CM | POA: Diagnosis not present

## 2014-04-18 DIAGNOSIS — I358 Other nonrheumatic aortic valve disorders: Secondary | ICD-10-CM | POA: Diagnosis not present

## 2014-04-18 DIAGNOSIS — R55 Syncope and collapse: Secondary | ICD-10-CM | POA: Diagnosis not present

## 2014-04-18 DIAGNOSIS — F329 Major depressive disorder, single episode, unspecified: Secondary | ICD-10-CM | POA: Diagnosis not present

## 2014-04-18 DIAGNOSIS — J90 Pleural effusion, not elsewhere classified: Secondary | ICD-10-CM | POA: Diagnosis not present

## 2014-04-18 DIAGNOSIS — I517 Cardiomegaly: Secondary | ICD-10-CM | POA: Diagnosis not present

## 2014-04-18 LAB — COMPREHENSIVE METABOLIC PANEL
ALK PHOS: 72 U/L (ref 39–117)
ALT: 16 U/L (ref 0–53)
AST: 25 U/L (ref 0–37)
Albumin: 4.1 g/dL (ref 3.5–5.2)
Anion gap: 11 (ref 5–15)
BUN: 30 mg/dL — ABNORMAL HIGH (ref 6–23)
CO2: 28 mmol/L (ref 19–32)
CREATININE: 2.49 mg/dL — AB (ref 0.50–1.35)
Calcium: 9.5 mg/dL (ref 8.4–10.5)
Chloride: 99 mmol/L (ref 96–112)
GFR calc Af Amer: 28 mL/min — ABNORMAL LOW (ref >90)
GFR, EST NON AFRICAN AMERICAN: 24 mL/min — AB (ref >90)
Glucose, Bld: 132 mg/dL — ABNORMAL HIGH (ref 70–99)
Potassium: 3.8 mmol/L (ref 3.5–5.1)
SODIUM: 138 mmol/L (ref 135–145)
TOTAL PROTEIN: 6.9 g/dL (ref 6.0–8.3)
Total Bilirubin: 0.6 mg/dL (ref 0.3–1.2)

## 2014-04-18 LAB — URINALYSIS, ROUTINE W REFLEX MICROSCOPIC
Glucose, UA: NEGATIVE mg/dL
Hgb urine dipstick: NEGATIVE
Ketones, ur: NEGATIVE mg/dL
Leukocytes, UA: NEGATIVE
Nitrite: NEGATIVE
PH: 5 (ref 5.0–8.0)
Protein, ur: NEGATIVE mg/dL
Specific Gravity, Urine: 1.021 (ref 1.005–1.030)
Urobilinogen, UA: 1 mg/dL (ref 0.0–1.0)

## 2014-04-18 LAB — CBC WITH DIFFERENTIAL/PLATELET
Basophils Absolute: 0 10*3/uL (ref 0.0–0.1)
Basophils Relative: 0 % (ref 0–1)
EOS ABS: 0.3 10*3/uL (ref 0.0–0.7)
Eosinophils Relative: 4 % (ref 0–5)
HEMATOCRIT: 38.2 % — AB (ref 39.0–52.0)
Hemoglobin: 11.8 g/dL — ABNORMAL LOW (ref 13.0–17.0)
LYMPHS ABS: 1.1 10*3/uL (ref 0.7–4.0)
LYMPHS PCT: 14 % (ref 12–46)
MCH: 28.8 pg (ref 26.0–34.0)
MCHC: 30.9 g/dL (ref 30.0–36.0)
MCV: 93.2 fL (ref 78.0–100.0)
MONOS PCT: 7 % (ref 3–12)
Monocytes Absolute: 0.6 10*3/uL (ref 0.1–1.0)
NEUTROS ABS: 5.9 10*3/uL (ref 1.7–7.7)
NEUTROS PCT: 75 % (ref 43–77)
Platelets: 203 10*3/uL (ref 150–400)
RBC: 4.1 MIL/uL — AB (ref 4.22–5.81)
RDW: 13.7 % (ref 11.5–15.5)
WBC: 8 10*3/uL (ref 4.0–10.5)

## 2014-04-18 LAB — CARBOXYHEMOGLOBIN
Carboxyhemoglobin: 1.6 % — ABNORMAL HIGH (ref 0.5–1.5)
METHEMOGLOBIN: 0.9 % (ref 0.0–1.5)
O2 Saturation: 67.5 %
TOTAL HEMOGLOBIN: 12.2 g/dL — AB (ref 13.5–18.0)

## 2014-04-18 LAB — AMMONIA: Ammonia: 16 umol/L (ref 11–32)

## 2014-04-18 LAB — BRAIN NATRIURETIC PEPTIDE: B NATRIURETIC PEPTIDE 5: 235.4 pg/mL — AB (ref 0.0–100.0)

## 2014-04-18 LAB — RAPID URINE DRUG SCREEN, HOSP PERFORMED
Amphetamines: NOT DETECTED
BENZODIAZEPINES: POSITIVE — AB
Barbiturates: NOT DETECTED
Cocaine: NOT DETECTED
Opiates: POSITIVE — AB
TETRAHYDROCANNABINOL: NOT DETECTED

## 2014-04-18 LAB — D-DIMER, QUANTITATIVE: D-Dimer, Quant: 0.73 ug/mL-FEU — ABNORMAL HIGH (ref 0.00–0.48)

## 2014-04-18 LAB — ETHANOL

## 2014-04-18 LAB — TSH: TSH: 0.897 u[IU]/mL (ref 0.350–4.500)

## 2014-04-18 LAB — GLUCOSE, CAPILLARY: GLUCOSE-CAPILLARY: 208 mg/dL — AB (ref 70–99)

## 2014-04-18 LAB — I-STAT TROPONIN, ED: TROPONIN I, POC: 0.03 ng/mL (ref 0.00–0.08)

## 2014-04-18 LAB — TROPONIN I: Troponin I: 0.06 ng/mL — ABNORMAL HIGH (ref <0.031)

## 2014-04-18 LAB — MRSA PCR SCREENING: MRSA by PCR: NEGATIVE

## 2014-04-18 LAB — CBG MONITORING, ED: Glucose-Capillary: 109 mg/dL — ABNORMAL HIGH (ref 70–99)

## 2014-04-18 LAB — I-STAT CG4 LACTIC ACID, ED: Lactic Acid, Venous: 1.24 mmol/L (ref 0.5–2.0)

## 2014-04-18 MED ORDER — ACETAMINOPHEN 650 MG RE SUPP: 650.0000 mg | Freq: Four times a day (QID) | RECTAL | Status: DC | PRN

## 2014-04-18 MED ORDER — FOLIC ACID 1 MG PO TABS
1.0000 mg | ORAL_TABLET | Freq: Every day | ORAL | Status: DC
  Administered 2014-04-18 – 2014-04-25 (×7): 1 mg via ORAL
  Filled 2014-04-18 (×9): qty 1

## 2014-04-18 MED ORDER — TECHNETIUM TO 99M ALBUMIN AGGREGATED
6.0000 | Freq: Once | INTRAVENOUS | Status: AC | PRN
  Administered 2014-04-18: 6 via INTRAVENOUS

## 2014-04-18 MED ORDER — IPRATROPIUM-ALBUTEROL 0.5-2.5 (3) MG/3ML IN SOLN
3.0000 mL | Freq: Once | RESPIRATORY_TRACT | Status: AC
  Administered 2014-04-18: 3 mL via RESPIRATORY_TRACT
  Filled 2014-04-18: qty 3

## 2014-04-18 MED ORDER — SODIUM CHLORIDE 0.9 % IV BOLUS (SEPSIS)
500.0000 mL | Freq: Once | INTRAVENOUS | Status: AC
  Administered 2014-04-18: 500 mL via INTRAVENOUS

## 2014-04-18 MED ORDER — ASPIRIN EC 325 MG PO TBEC
325.0000 mg | DELAYED_RELEASE_TABLET | Freq: Every day | ORAL | Status: DC
  Administered 2014-04-18 – 2014-04-25 (×7): 325 mg via ORAL
  Filled 2014-04-18 (×9): qty 1

## 2014-04-18 MED ORDER — ADULT MULTIVITAMIN W/MINERALS CH
1.0000 | ORAL_TABLET | Freq: Every day | ORAL | Status: DC
  Administered 2014-04-18 – 2014-04-25 (×7): 1 via ORAL
  Filled 2014-04-18 (×9): qty 1

## 2014-04-18 MED ORDER — ONDANSETRON HCL 4 MG/2ML IJ SOLN
4.0000 mg | Freq: Four times a day (QID) | INTRAMUSCULAR | Status: DC | PRN
  Filled 2014-04-18: qty 2

## 2014-04-18 MED ORDER — ONDANSETRON HCL 4 MG PO TABS: 4.0000 mg | ORAL_TABLET | Freq: Four times a day (QID) | ORAL | Status: DC | PRN

## 2014-04-18 MED ORDER — HEPARIN SODIUM (PORCINE) 5000 UNIT/ML IJ SOLN
5000.0000 [IU] | Freq: Three times a day (TID) | INTRAMUSCULAR | Status: DC
  Administered 2014-04-18 – 2014-04-22 (×13): 5000 [IU] via SUBCUTANEOUS
  Filled 2014-04-18 (×17): qty 1

## 2014-04-18 MED ORDER — DONEPEZIL HCL 10 MG PO TABS
10.0000 mg | ORAL_TABLET | Freq: Every day | ORAL | Status: DC
  Administered 2014-04-18 – 2014-04-29 (×11): 10 mg via ORAL
  Filled 2014-04-18 (×14): qty 1

## 2014-04-18 MED ORDER — SIMVASTATIN 20 MG PO TABS
20.0000 mg | ORAL_TABLET | Freq: Every day | ORAL | Status: DC
  Administered 2014-04-18 – 2014-05-02 (×13): 20 mg via ORAL
  Filled 2014-04-18 (×15): qty 1

## 2014-04-18 MED ORDER — OXYCODONE HCL 5 MG PO TABS: 5.0000 mg | ORAL_TABLET | ORAL | Status: DC | PRN

## 2014-04-18 MED ORDER — TECHNETIUM TC 99M DIETHYLENETRIAME-PENTAACETIC ACID: 40.0000 | Freq: Once | INTRAVENOUS | Status: AC | PRN

## 2014-04-18 MED ORDER — METHYLPREDNISOLONE SODIUM SUCC 125 MG IJ SOLR
125.0000 mg | Freq: Once | INTRAMUSCULAR | Status: AC
  Administered 2014-04-18: 125 mg via INTRAVENOUS
  Filled 2014-04-18: qty 2

## 2014-04-18 MED ORDER — ACETAMINOPHEN 325 MG PO TABS: 650.0000 mg | ORAL_TABLET | Freq: Four times a day (QID) | ORAL | Status: DC | PRN

## 2014-04-18 MED ORDER — PANTOPRAZOLE SODIUM 40 MG PO TBEC
40.0000 mg | DELAYED_RELEASE_TABLET | Freq: Every day | ORAL | Status: DC
  Administered 2014-04-18 – 2014-04-25 (×7): 40 mg via ORAL
  Filled 2014-04-18 (×7): qty 1

## 2014-04-18 MED ORDER — DEXTROSE 5 % IV SOLN
1.0000 g | INTRAVENOUS | Status: DC
  Administered 2014-04-18: 1 g via INTRAVENOUS
  Filled 2014-04-18 (×2): qty 10

## 2014-04-18 MED ORDER — VENLAFAXINE HCL ER 150 MG PO CP24
150.0000 mg | ORAL_CAPSULE | Freq: Every day | ORAL | Status: DC
  Administered 2014-04-19 – 2014-05-02 (×12): 150 mg via ORAL
  Filled 2014-04-18 (×16): qty 1

## 2014-04-18 MED ORDER — SODIUM CHLORIDE 0.9 % IV SOLN
INTRAVENOUS | Status: DC
  Administered 2014-04-18 – 2014-04-19 (×2): via INTRAVENOUS

## 2014-04-18 MED ORDER — INSULIN ASPART 100 UNIT/ML ~~LOC~~ SOLN
0.0000 [IU] | SUBCUTANEOUS | Status: DC
  Administered 2014-04-18: 5 [IU] via SUBCUTANEOUS
  Administered 2014-04-19 (×4): 2 [IU] via SUBCUTANEOUS

## 2014-04-18 MED ORDER — GABAPENTIN 400 MG PO CAPS
400.0000 mg | ORAL_CAPSULE | Freq: Three times a day (TID) | ORAL | Status: DC
Start: 2014-04-18 — End: 2014-05-02
  Administered 2014-04-18 – 2014-05-02 (×37): 400 mg via ORAL
  Filled 2014-04-18 (×44): qty 1

## 2014-04-18 MED ORDER — SODIUM CHLORIDE 0.9 % IJ SOLN
3.0000 mL | Freq: Two times a day (BID) | INTRAMUSCULAR | Status: DC
  Administered 2014-04-18 – 2014-04-25 (×12): 3 mL via INTRAVENOUS

## 2014-04-18 MED ORDER — VITAMIN B-1 100 MG PO TABS
100.0000 mg | ORAL_TABLET | Freq: Every day | ORAL | Status: DC
  Administered 2014-04-18 – 2014-04-25 (×7): 100 mg via ORAL
  Filled 2014-04-18 (×10): qty 1

## 2014-04-18 MED ORDER — SODIUM CHLORIDE 0.9 % IV SOLN
Freq: Once | INTRAVENOUS | Status: AC
  Administered 2014-04-18: 17:00:00 via INTRAVENOUS

## 2014-04-18 NOTE — H&P (Signed)
[facility] History and Physical  Layn Kye VWU:[id number] DOB: 1940-07-12 DOA: 04/18/2014  Referring physician: Arthor Captain, PA PCP: Carollee Leitz, NP   Chief Complaint: Syncope  HPI: Peter Weiss is a 74 y.o. male presents with a syncopal episode. He is a little confused with his days. He is not sure if he passed out today or yesterday but friends noted he had a syncopal episode today. He states that his memory has been poor. Patient states that he was going to stand up and he passed out. He had no chest pain. He states that he has noted dizziness when he stands up. He states that he had a recent cardiac cath which he thinks was a few days ago but in reality he had it done in January. He has noted some edema. He states he was at [facility] when he had the initial cardiac event. Patient also has a significant history of aortic stenosis. Patient is on multiple medications for his blood pressure and has been noted to have a low BP in the ED. He has not had orthostatics checked   Review of Systems:  Constitutional:  No weight loss, night sweats, ++Fevers, chills, ++fatigue.  HEENT:  No headaches, Difficulty swallowing,Tooth/dental problems,Sore throat Cardio-vascular:  No chest pain, Orthopnea, PND, swelling in lower extremities, anasarca, ++dizziness, ++palpitations  GI:  No heartburn, indigestion, abdominal pain, nausea, vomiting Resp:  No shortness of breath with exertion or at rest. No excess mucus, no productive cough, No non-productive cough, No coughing up of blood Skin:  no rash or lesions.  GU:  no dysuria, change in color of urine, no urgency or frequency. No flank pain.  Musculoskeletal:  No joint pain or swelling. No decreased range of motion Psych:  No change in mood or affect. No depression or anxiety. ++memory loss.   Past Medical History  Diagnosis Date  . Arthritis   . Chicken pox   . Heart murmur   . Hypertension   . Hyperlipidemia   .  Urinary incontinence   . History of fainting spells of unknown cause   . Diabetes mellitus without complication   . Depression    Past Surgical History  Procedure Laterality Date  . Goiter  2008   Social History:  reports that he has never smoked. He has never used smokeless tobacco. He reports that he does not drink alcohol or use illicit drugs.  No Known Allergies  Family History  Problem Relation Age of Onset  . Alcohol abuse Mother   . Heart disease Mother   . Diabetes Mother   . Heart disease Father   . Diabetes Father   . Cancer Mother   . Cancer Father   . Alcohol abuse Father   . Alcohol abuse Brother      Prior to Admission medications   Medication Sig Start Date End Date Taking? Authorizing Provider  amLODipine (NORVASC) 5 MG tablet Take 5 mg by mouth daily.    Historical Provider, MD  B-Complex CAPS Take 1 capsule by mouth daily.    Historical Provider, MD  donepezil (ARICEPT) 10 MG tablet Take 10 mg by mouth at bedtime.    Historical Provider, MD  donepezil (ARICEPT) 10 MG tablet Take 10 mg by mouth at bedtime.    Historical Provider, MD  furosemide (LASIX) 40 MG tablet Take 40 mg by mouth.    Historical Provider, MD  furosemide (LASIX) 40 MG tablet Take 40 mg by mouth.    Historical Provider,  MD  gabapentin (NEURONTIN) 400 MG capsule Take 400 mg by mouth 3 (three) times daily.    Historical Provider, MD  gabapentin (NEURONTIN) 400 MG capsule Take 400 mg by mouth 3 (three) times daily.    Historical Provider, MD  losartan (COZAAR) 50 MG tablet Take 50 mg by mouth daily.    Historical Provider, MD  losartan (COZAAR) 50 MG tablet Take 50 mg by mouth daily.    Historical Provider, MD  metFORMIN (GLUCOPHAGE) 1000 MG tablet Take 1,000 mg by mouth 2 (two) times daily with a meal.    Historical Provider, MD  metoprolol succinate (TOPROL-XL) 50 MG 24 hr tablet Take 50 mg by mouth daily. Take with or immediately following a meal.    Historical Provider, MD  metoprolol  succinate (TOPROL-XL) 50 MG 24 hr tablet Take 50 mg by mouth daily. Take with or immediately following a meal.    Historical Provider, MD  Multiple Vitamin (MULTIVITAMIN) capsule Take 1 capsule by mouth daily.    Historical Provider, MD  omeprazole (PRILOSEC) 20 MG capsule Take 20 mg by mouth daily.    Historical Provider, MD  omeprazole (PRILOSEC) 20 MG capsule Take 20 mg by mouth daily.    Historical Provider, MD  Oxycodone HCl 10 MG TABS Take 1 tablet (10 mg total) by mouth 3 (three) times daily. 09/28/13   Lorre Munroeegina W Baity, NP  simvastatin (ZOCOR) 20 MG tablet Take 20 mg by mouth daily.    Historical Provider, MD  venlafaxine XR (EFFEXOR-XR) 150 MG 24 hr capsule Take 150 mg by mouth daily with breakfast.    Historical Provider, MD  venlafaxine XR (EFFEXOR-XR) 150 MG 24 hr capsule Take 150 mg by mouth daily with breakfast.    Historical Provider, MD   Physical Exam: Filed Vitals:   04/18/14 1505 04/18/14 1645 04/18/14 1730 04/18/14 1800  BP:  94/52 94/60 100/55  Pulse:  90 87 93  Temp:      TempSrc:      Resp:  18 13 17   Height: 5\' 11"  (1.803 m)     Weight: 110.224 kg (243 lb)     SpO2:  99% 99% 97%    Wt Readings from Last 3 Encounters:  04/18/14 110.224 kg (243 lb)  03/21/14 116.121 kg (256 lb)  09/28/13 116.234 kg (256 lb 4 oz)    General:  Appears calm and comfortable confused Eyes: PERRL, normal lids, irises & conjunctiva ENT: grossly normal hearing, lips & tongue Neck: no LAD, masses or thyromegaly Cardiovascular: IRR, no m/r/g. ++ LE edema. Telemetry: atrial Fibrillation Respiratory: CTA bilaterally, no w/r/r. Normal respiratory effort. Abdomen: soft, ntnd Skin: ++erythema noted on anterior shins with edema Musculoskeletal: grossly normal tone BUE/BLE Psychiatric: grossly normal mood and affect, speech fluent and appropriate Neurologic: grossly non-focal.          Labs on Admission:  Basic Metabolic Panel:  Recent Labs Lab 04/18/14 1404  NA 138  K 3.8  CL 99    CO2 28  GLUCOSE 132*  BUN 30*  CREATININE 2.49*  CALCIUM 9.5   Liver Function Tests:  Recent Labs Lab 04/18/14 1404  AST 25  ALT 16  ALKPHOS 72  BILITOT 0.6  PROT 6.9  ALBUMIN 4.1   No results for input(s): LIPASE, AMYLASE in the last 168 hours.  Recent Labs Lab 04/18/14 1522  AMMONIA 16   CBC:  Recent Labs Lab 04/18/14 1404  WBC 8.0  NEUTROABS 5.9  HGB 11.8*  HCT 38.2*  MCV  93.2  PLT 203   Cardiac Enzymes: No results for input(s): CKTOTAL, CKMB, CKMBINDEX, TROPONINI in the last 168 hours.  BNP (last 3 results)  Recent Labs  04/18/14 1404  BNP 235.4*    ProBNP (last 3 results) No results for input(s): PROBNP in the last 8760 hours.  CBG:  Recent Labs Lab 04/18/14 1421  GLUCAP 109*    Radiological Exams on Admission: Dg Chest 2 View  04/18/2014   CLINICAL DATA:  Shortness of breath for 1 day  EXAM: CHEST  2 VIEW  COMPARISON:  Chest x-ray of 03/21/2014  FINDINGS: No active infiltrate or effusion is seen. Cardiomegaly is stable. Mild eventration at the right lung base is unchanged. The bones are osteopenic.  IMPRESSION: Stable cardiomegaly.  No active lung disease.   Electronically Signed   By: Dwyane Dee M.D.   On: 04/18/2014 17:14    EKG: Independently reviewed. Atrial fib with RBBB  Assessment/Plan Active Problems:   DM2 (diabetes mellitus, type 2)   Syncope and collapse   Dehydration   Hypotension   Syncope   1. Syncope -he is hypotensive in the ED not clear if this is just due to dehydration or combination with his medications -will hold all BP medications for now -monitor pressures -will check serial enzymes -check carotid doppler -he has been orderd and VQ scan to look for PE by the ED -also ordered a D-Dimer  2. DM2 -will hold metformin due to renal function -will check FSBS -will place on sliding scale  3. Hypotension -hydrate now -hold BP medications -will monitor pressures  4. AKI -likely due to hypotension  dehydration -monior labs  5. Fever -emperically started on antibiotics   Code Status: Full Code (must indicate code status--if unknown or must be presumed, indicate so) DVT Prophylaxis:Heparin Family Communication: None (indicate person spoken with, if applicable, with phone number if by telephone) Disposition Plan: Home (indicate anticipated LOS)  Time spent:62min  [facility] A [facility] Pager [phone]

## 2014-04-18 NOTE — Progress Notes (Signed)
Pt arrived from ED via stretcher. CHG bath given and MRSA swab sent to lab. Pt oriented to room and is resting comfortably. Will continue to monitor.  Alba DestineMisty L Ennis, RN, BSN

## 2014-04-18 NOTE — Progress Notes (Signed)
ED CM reviewed record, Patient gives history of being at [facility] 3 weeks ago and receiving possible stent according to A. Paragon PA- C  Note.  PCP listed as Lorane Gell and [facility] Medicare insurance. ED CM met with patient  at bedside to confirm information in record, patient very SOB and dyspneic unable to answer questions. ED SW assisting with securing care for patient's animals.  Explained to patient that CM/SW  will f/u with patient later.

## 2014-04-18 NOTE — ED Notes (Signed)
Per EMS: 3 weeks ago went to [place] [facility], went to have cath, may or may not have been stented.  Today started feeling sob, weakness, dizziness, lungs clear, sats in the low 90s, 96% on 2 L.  ECG afib, hx of the same, cbg 169.  House has 3-4 heaters with propane tanks with black coats of black carbon covering everything in the house included a white dog that is now darker.  Also has a bed ridden cancer patient in the house.

## 2014-04-18 NOTE — Progress Notes (Signed)
ANTIBIOTIC CONSULT NOTE - INITIAL  Pharmacy Consult for Ceftriaxone Indication: Fever?  No Known Allergies  Patient Measurements: Height: 5\' 11"  (180.3 cm) Weight: 243 lb (110.224 kg) IBW/kg (Calculated) : 75.3  Vital Signs: Temp: 100.4 F (38 C) (02/04 1433) Temp Source: Rectal (02/04 1433) BP: 100/55 mmHg (02/04 1800) Pulse Rate: 93 (02/04 1800) Intake/Output from previous day:   Intake/Output from this shift:    Labs:  Recent Labs  04/18/14 1404  WBC 8.0  HGB 11.8*  PLT 203  CREATININE 2.49*   Estimated Creatinine Clearance: 34.4 mL/min (by C-G formula based on Cr of 2.49). No results for input(s): VANCOTROUGH, VANCOPEAK, VANCORANDOM, GENTTROUGH, GENTPEAK, GENTRANDOM, TOBRATROUGH, TOBRAPEAK, TOBRARND, AMIKACINPEAK, AMIKACINTROU, AMIKACIN in the last 72 hours.   Microbiology: No results found for this or any previous visit (from the past 720 hour(s)).  Medical History: Past Medical History  Diagnosis Date  . Arthritis   . Chicken pox   . Heart murmur   . Hypertension   . Hyperlipidemia   . Urinary incontinence   . History of fainting spells of unknown cause   . Diabetes mellitus without complication   . Depression    Assessment: 74 yo M presents on 2/4 with SOB. Pt had a fever on admission and pharmacy was asked to dose Rocephin.  Goal of Therapy:  Resolution of infection  Plan:  Start ceftriaxone 1g IV Q24 Monitor clinical picture F/U need for abx?  Rx will sign off as ceftriaxone does not require any renal dose adjustments. Thanks.  Peter Weiss,Peter Weiss 04/18/2014,6:31 PM

## 2014-04-18 NOTE — ED Notes (Signed)
States he fell down last night.   Alert and oriented x4

## 2014-04-18 NOTE — ED Notes (Signed)
Patient transported to X-ray 

## 2014-04-18 NOTE — Progress Notes (Signed)
Asked by PA to assist patient with securing care for his animals while he is in hospital.  CSW called pt's roommate, June, who confirms that she has been, and will continue to, care for pt's dogs.  June also confirmed that the dogs have enough food for this pm and that she would have money to buy extra food tomorrow, should the dogs run out.

## 2014-04-18 NOTE — ED Provider Notes (Signed)
CSN: [id number]     Arrival date & time 04/18/14  1326 History   First MD Initiated Contact with Patient 04/18/14 1351     Chief Complaint  Patient presents with  . Shortness of Breath     (Consider location/radiation/quality/duration/timing/severity/associated sxs/prior Treatment) HPI Sherron Mummert is a 74 y.o. male with history of diabetes, depression, hypertension, coronary disease, syncopal episodes, presents to emergency department after a syncopal episode. Patient states he was feeling dizzy, and [place], states he passed out. The incident happened at home, friends in his house called EMS. Patient admits to shortness of breath. Admits to weakness. Patient gives history of being at [facility] 3 weeks ago and receiving possible stent. He is unable to give [place] more history. EMS concerned about multiple propane tanks in the house with black coating over walls and furniture. Patient denies any pain at this time. He denies any headache, nausea, vomiting, chest pain, abdominal pain, pain in his extremities. He does admit taking his "pain pill" after his syncopal episode. States pain is generalized and chronic   Past Medical History  Diagnosis Date  . Arthritis   . Chicken pox   . Heart murmur   . Hypertension   . Hyperlipidemia   . Urinary incontinence   . History of fainting spells of unknown cause   . Diabetes mellitus without complication   . Depression    Past Surgical History  Procedure Laterality Date  . Goiter  2008   Family History  Problem Relation Age of Onset  . Alcohol abuse Mother   . Heart disease Mother   . Diabetes Mother   . Heart disease Father   . Diabetes Father   . Cancer Mother   . Cancer Father   . Alcohol abuse Father   . Alcohol abuse Brother    History  Substance Use Topics  . Smoking status: Never Smoker   . Smokeless tobacco: Never Used  . Alcohol Use: No    Review of Systems  Constitutional: Negative for fever and chills.   Respiratory: Positive for cough and shortness of breath. Negative for chest tightness.   Cardiovascular: Negative for chest pain, palpitations and leg swelling.  Gastrointestinal: Negative for nausea, vomiting, abdominal pain, diarrhea and abdominal distention.  Genitourinary: Negative for dysuria, urgency, frequency and hematuria.  Musculoskeletal: Negative for myalgias, arthralgias, neck pain and neck stiffness.  Skin: Negative for rash.  Allergic/Immunologic: Negative for immunocompromised state.  Neurological: Positive for dizziness, syncope and light-headedness. Negative for weakness, numbness and headaches.      Allergies  Review of patient's allergies indicates no known allergies.  Home Medications   Prior to Admission medications   Medication Sig Start Date End Date Taking? Authorizing Provider  amLODipine (NORVASC) 5 MG tablet Take 5 mg by mouth daily.    Historical Provider, MD  B-Complex CAPS Take 1 capsule by mouth daily.    Historical Provider, MD  donepezil (ARICEPT) 10 MG tablet Take 10 mg by mouth at bedtime.    Historical Provider, MD  donepezil (ARICEPT) 10 MG tablet Take 10 mg by mouth at bedtime.    Historical Provider, MD  furosemide (LASIX) 40 MG tablet Take 40 mg by mouth.    Historical Provider, MD  furosemide (LASIX) 40 MG tablet Take 40 mg by mouth.    Historical Provider, MD  gabapentin (NEURONTIN) 400 MG capsule Take 400 mg by mouth 3 (three) times daily.    Historical Provider, MD  gabapentin (NEURONTIN) 400 MG capsule  Take 400 mg by mouth 3 (three) times daily.    Historical Provider, MD  losartan (COZAAR) 50 MG tablet Take 50 mg by mouth daily.    Historical Provider, MD  losartan (COZAAR) 50 MG tablet Take 50 mg by mouth daily.    Historical Provider, MD  metFORMIN (GLUCOPHAGE) 1000 MG tablet Take 1,000 mg by mouth 2 (two) times daily with a meal.    Historical Provider, MD  metoprolol succinate (TOPROL-XL) 50 MG 24 hr tablet Take 50 mg by mouth daily.  Take with or immediately following a meal.    Historical Provider, MD  metoprolol succinate (TOPROL-XL) 50 MG 24 hr tablet Take 50 mg by mouth daily. Take with or immediately following a meal.    Historical Provider, MD  Multiple Vitamin (MULTIVITAMIN) capsule Take 1 capsule by mouth daily.    Historical Provider, MD  omeprazole (PRILOSEC) 20 MG capsule Take 20 mg by mouth daily.    Historical Provider, MD  omeprazole (PRILOSEC) 20 MG capsule Take 20 mg by mouth daily.    Historical Provider, MD  Oxycodone HCl 10 MG TABS Take 1 tablet (10 mg total) by mouth 3 (three) times daily. 09/28/13   Lorre Munroeegina W Baity, NP  simvastatin (ZOCOR) 20 MG tablet Take 20 mg by mouth daily.    Historical Provider, MD  venlafaxine XR (EFFEXOR-XR) 150 MG 24 hr capsule Take 150 mg by mouth daily with breakfast.    Historical Provider, MD  venlafaxine XR (EFFEXOR-XR) 150 MG 24 hr capsule Take 150 mg by mouth daily with breakfast.    Historical Provider, MD   Pulse 91  Temp(Src) 98.9 F (37.2 C) (Oral)  Resp 20  Ht 5\' 11"  (1.803 m)  Wt 250 lb (113.399 kg)  BMI 34.88 kg/m2  SpO2 92% Physical Exam  Constitutional: He is oriented to person, place, and time. He appears well-developed and well-nourished. No distress.  HENT:  Head: Normocephalic and atraumatic.  Eyes: Conjunctivae and EOM are normal. Pupils are equal, round, and reactive to light.  Neck: Normal range of motion. Neck supple.  Cardiovascular: Normal rate, regular rhythm and normal heart sounds.   Pulmonary/Chest: Effort normal. No respiratory distress. He has wheezes. He has rales.  Abdominal: Soft. Bowel sounds are normal. He exhibits no distension. There is no tenderness. There is no rebound and no guarding.  Musculoskeletal: He exhibits no edema.  Neurological: He is alert and oriented to person, place, and time. No cranial nerve deficit. Coordination normal.  5/5 and equal upper and lower extremity strength bilaterally. Equal grip strength bilaterally.  Normal finger to nose and heel to shin. No pronator drift.   Skin: Skin is warm and dry. There is pallor.  Psychiatric: His affect is inappropriate. His speech is slurred. He expresses inappropriate judgment.  Nursing note and vitals reviewed.   ED Course  Procedures (including critical care time) Labs Review Labs Reviewed  CBC WITH DIFFERENTIAL/PLATELET  COMPREHENSIVE METABOLIC PANEL  BRAIN NATRIURETIC PEPTIDE  CARBOXYHEMOGLOBIN  I-STAT TROPOININ, ED    Imaging Review No results found.   EKG Interpretation None      MDM   Final diagnoses:  None    Patient seen and examined, patient with a syncopal episode at home, confusion although he is alert and oriented 3, weakness, shortness of breath. On exam patient is pale-appearing, rales or wheezes auscultated. There was some concern about CO poisoning, labs drawn. Patient's blood pressure is low, will start IV fluids, check rectal temperature, check lactic acid.  Pt's rectal temp 100.4. Waiting on outside records from prior hospitalization. BPup to 90s systolic. Pt in no distress. Labs and CXR pending. At this time, normal lactic acid, normal WBC. ecg showing afib, hx of the same.   Pt signed out at shift change to PA  Harris. New renal insufficiency. Records received, patient admitted a month ago for similar presentation. Patient was admitted for an NSTEMI,   had cardiac cath, no stenting. Patient was in A. fib at that time, is not anticoagulated due to history of multiple falls. He was also hypotensive at time of the admission, and they believe that was from dehydration and A. fib. Today patient has new renal insufficiency. Still awaiting on the chest x-ray. PE considered, PA Tiburcio Pea will order VQ scan, and admitted to the hospital.    Lottie Mussel, PA-C 04/19/14 1610  Hilario Quarry, MD 04/19/14 [phone]

## 2014-04-18 NOTE — ED Notes (Signed)
Attempted to call report on pt. Floor will call.

## 2014-04-18 NOTE — ED Provider Notes (Signed)
4:12 PM BP 96/57 mmHg  Pulse 84  Temp(Src) 100.4 F (38 C) (Rectal)  Resp 13  Ht 5\' 11"  (1.803 m)  Wt 243 lb (110.224 kg)  BMI 33.91 kg/m2  SpO2 93%   Syncope today. House full of soot/ Kerosene heaters Hypotension, rectal temp 104. +febrile, syncope, AKI, (last CR 1.2 on 1/8)  +Afib today. Fluids, - lactate. Patient c/o SOB.      [facility]  Admitted forNstemi,Hypotension,severe aortic stensosis Cardiac cath- after NSTEMI,  no sig CAD on cath. Patient + afib. No anticoagulation b/c of frequent falls.  also thought the tropnin elevation due to afib and demand ischemia b/c of his aortic stenosis.  Awaiting cxr.   6:18 PM Filed Vitals:   04/18/14 1500 04/18/14 1505 04/18/14 1645 04/18/14 1730  BP: 96/57  94/52 94/60  Pulse:   90 87  Temp:      TempSrc:      Resp: 13  18 13   Height:  5\' 11"  (1.803 m)    Weight:  243 lb (110.224 kg)    SpO2:   99% 99%   Chest x-ray is without acute abnormality. He has a large hiatal hernia which appears to have made his heart shaped irregular. The patient has a persistent soft systolic pressure. I believe his syncopal events, shortness of breath are likely secondary to his dehydration and severe aortic stenosis. The patient is clearly dehydrated today with an acute kidney injury. As the patient is also tachycardic and complaining of shortness of breath along with atrial fibrillation and his lack of anticoagulation. Pulmonary embolus is also on the differential. I ordered a VQ scan as the patient's kidney function is poor today secondary to his dehydration. I spoken with Dr. Park BreedKahn, who will admit the patient. I have asked the case managers to speak with the patient's roommates about caring for his dogs as he was very concerned about this and did not want to be admitted.  I explained the seriousness of his condition and he agrees to admission. Pt stable in ED with no significant deterioration in condition.  The  patient appears reasonably stabilized for admission considering the current resources, flow, and capabilities available in the ED at this time, and I doubt any other [facility] requiring further screening and/or treatment in the ED prior to admission.   Arthor Captainbigail Candia Kingsbury, PA-C 04/19/14 1401  Richardean Canalavid H Yao, MD 04/21/14 2012

## 2014-04-18 NOTE — Progress Notes (Signed)
Attempted report X1

## 2014-04-18 NOTE — ED Notes (Signed)
Pt. Transported to nuclear med.

## 2014-04-18 NOTE — ED Notes (Signed)
Notified RN of CBG 109

## 2014-04-19 DIAGNOSIS — R55 Syncope and collapse: Secondary | ICD-10-CM

## 2014-04-19 LAB — COMPREHENSIVE METABOLIC PANEL
ALK PHOS: 54 U/L (ref 39–117)
ALT: 14 U/L (ref 0–53)
AST: 22 U/L (ref 0–37)
Albumin: 3.3 g/dL — ABNORMAL LOW (ref 3.5–5.2)
Anion gap: 10 (ref 5–15)
BUN: 34 mg/dL — ABNORMAL HIGH (ref 6–23)
CALCIUM: 8.5 mg/dL (ref 8.4–10.5)
CO2: 25 mmol/L (ref 19–32)
CREATININE: 1.69 mg/dL — AB (ref 0.50–1.35)
Chloride: 104 mmol/L (ref 96–112)
GFR calc Af Amer: 45 mL/min — ABNORMAL LOW (ref >90)
GFR calc non Af Amer: 39 mL/min — ABNORMAL LOW (ref >90)
GLUCOSE: 127 mg/dL — AB (ref 70–99)
POTASSIUM: 3.8 mmol/L (ref 3.5–5.1)
Sodium: 139 mmol/L (ref 135–145)
Total Bilirubin: 0.7 mg/dL (ref 0.3–1.2)
Total Protein: 6 g/dL (ref 6.0–8.3)

## 2014-04-19 LAB — CBC
HCT: 31.8 % — ABNORMAL LOW (ref 39.0–52.0)
HEMOGLOBIN: 10 g/dL — AB (ref 13.0–17.0)
MCH: 29.1 pg (ref 26.0–34.0)
MCHC: 31.4 g/dL (ref 30.0–36.0)
MCV: 92.4 fL (ref 78.0–100.0)
Platelets: 182 10*3/uL (ref 150–400)
RBC: 3.44 MIL/uL — ABNORMAL LOW (ref 4.22–5.81)
RDW: 13.6 % (ref 11.5–15.5)
WBC: 5.9 10*3/uL (ref 4.0–10.5)

## 2014-04-19 LAB — GLUCOSE, CAPILLARY
GLUCOSE-CAPILLARY: 125 mg/dL — AB (ref 70–99)
GLUCOSE-CAPILLARY: 122 mg/dL — AB (ref 70–99)
GLUCOSE-CAPILLARY: 122 mg/dL — AB (ref 70–99)
Glucose-Capillary: 146 mg/dL — ABNORMAL HIGH (ref 70–99)
Glucose-Capillary: 139 mg/dL — ABNORMAL HIGH (ref 70–99)
Glucose-Capillary: 100 mg/dL — ABNORMAL HIGH (ref 70–99)

## 2014-04-19 LAB — PROTIME-INR
INR: 1.2 (ref 0.00–1.49)
PROTHROMBIN TIME: 15.3 s — AB (ref 11.6–15.2)

## 2014-04-19 LAB — TROPONIN I
TROPONIN I: 0.06 ng/mL — AB (ref <0.031)
Troponin I: 0.05 ng/mL — ABNORMAL HIGH (ref <0.031)

## 2014-04-19 MED ORDER — TAMSULOSIN HCL 0.4 MG PO CAPS
0.4000 mg | ORAL_CAPSULE | Freq: Every day | ORAL | Status: DC
  Administered 2014-04-19 – 2014-05-02 (×12): 0.4 mg via ORAL
  Filled 2014-04-19 (×14): qty 1

## 2014-04-19 MED ORDER — OXYCODONE HCL 5 MG PO TABS: 5.0000 mg | ORAL_TABLET | ORAL | Status: DC | PRN

## 2014-04-19 MED ORDER — OXYCODONE HCL 5 MG PO TABS: 15.0000 mg | ORAL_TABLET | Freq: Four times a day (QID) | ORAL | Status: DC | PRN

## 2014-04-19 MED ORDER — INSULIN ASPART 100 UNIT/ML ~~LOC~~ SOLN
0.0000 [IU] | Freq: Three times a day (TID) | SUBCUTANEOUS | Status: DC
  Administered 2014-04-19 – 2014-04-21 (×3): 2 [IU] via SUBCUTANEOUS
  Administered 2014-04-21 – 2014-04-22 (×2): 3 [IU] via SUBCUTANEOUS
  Administered 2014-04-23: 5 [IU] via SUBCUTANEOUS
  Administered 2014-04-24 – 2014-04-25 (×3): 2 [IU] via SUBCUTANEOUS

## 2014-04-19 NOTE — Progress Notes (Signed)
VASCULAR LAB PRELIMINARY  PRELIMINARY  PRELIMINARY  PRELIMINARY  Carotid duplex  completed.    Preliminary report:  Right:  40-59% internal carotid artery stenosis by velocity.  Plaque morphology suggests a higher degree of stenosis. Cardiac issues may be a factor.   Left:  1-39% ICA stenosis.  Bilateral:  Vertebral artery flow is antegrade.     Peter Weiss, RVT 04/19/2014, 12:27 PM

## 2014-04-19 NOTE — Progress Notes (Signed)
Patient refused CPAP for tonight.  He said that he was resting comfortably and would try tomorrow night.

## 2014-04-19 NOTE — Progress Notes (Signed)
Peter Peter  Peter Peter:[id number] DOB: [birth year] DOA: 04/18/2014 PCP: Peter Leitz, NP  Admit HPI / Brief Narrative: 74 y.o. male who presented after a syncopal episode. He was confused with his days. He stated his memory has been poor. Patient stated he was going to stand up and he passed out. He had no chest pain. He had noted dizziness when he stands up. He stated he had a recent cardiac cath which he thought was "a few days ago" but in reality he had it done in January. He was at [facility] when he had the initial cardiac event. Patient has a significant history of aortic stenosis. Patient was noted to have a low BP in the ED.   HPI/Subjective: Pt is alert, but slightly confused.  He is conversant, but at times disoriented to place or time.  He c/o his chronic back pain, but denies cp, sob, n/v, or abdom pain.    Assessment/Plan:  Syncope  Appears to at least have been due to [facility] + BP meds - need to determine if AoS playing a role as well  AoS Unclear how severe, but cardiac cath report from [facility] Jan 2016 suggests very tight valve w/ ?need for TAVR - TTE pending - will consult Cards if results warrant   Hypotension w/ a hx of HTN Suspect this is due to [facility] + AoS   Acute kidney injury  Hydrate and follow - appears to be pre-renal azotemia   Modestly elevated temp Tmax 100.4 since admit - no leukocytosis - UA unrevealing - CXR w/o infiltrate - no clear indication for abx at this time   HLD  DM CBG well controlled - follow trend   Code Status: FULL Family Communication: no family present at time of exam Disposition Plan: SDU   Consultants: none  Procedures: 2/5 carotid dopplers - 40-59% R ICA stenosis - Peter-39% L ICA stenosis - vertebral flow antegrade  2/5 TTE - pending   Antibiotics: Rocephin 2/4 > 2/5  DVT prophylaxis: SQ heparin  Objective: Blood pressure 106/81, pulse 88, temperature 98.3 F (36.8 C), temperature  source Oral, resp. rate 12, height  (Peter.803 m), weight 116.4 kg (256 lb 9.9 oz), SpO2 99 %.  Intake/Output Summary (Last 24 hours) at 04/19/14 1340 Last data filed at 04/19/14 1300  Gross per 24 hour  Intake 948.75 ml  Output    401 ml  Net 547.75 ml   Exam: General: No acute respiratory distress Lungs: Clear to auscultation bilaterally without wheezes or crackles Cardiovascular: frequent ectopic beats - prominent blowing 4/6 holosystolic M Abdomen: Nontender, nondistended, soft, bowel sounds positive, no rebound, no ascites, no appreciable mass Extremities: No significant cyanosis, clubbing, or edema bilateral lower extremities  Data Reviewed: Basic Metabolic Panel:  Recent Labs Lab 04/18/14 1404 04/19/14 0750  NA 138 139  K 3.8 3.8  CL 99 104  CO2 28 25  GLUCOSE 132* 127*  BUN 30* 34*  CREATININE 2.49* Peter.69*  CALCIUM 9.5 8.5    Liver Function Tests:  Recent Labs Lab 04/18/14 1404 04/19/14 0750  AST 25 22  ALT 16 14  ALKPHOS 72 54  BILITOT 0.6 0.7  PROT 6.9 6.0  ALBUMIN 4.Peter 3.3*    Recent Labs Lab 04/18/14 1522  AMMONIA 16   Coags:  Recent Labs Lab 04/19/14 0750  INR Peter.20   CBC:  Recent Labs Lab 04/18/14 1404 04/19/14 0750  WBC 8.0 5.9  NEUTROABS 5.9  --  HGB 11.8* 10.0*  HCT 38.2* 31.8*  MCV 93.2 92.4  PLT 203 182    Cardiac Enzymes:  Recent Labs Lab 04/18/14 2114 04/19/14 0327 04/19/14 0750  TROPONINI 0.06* 0.06* 0.05*   BNP (last 3 results)  Recent Labs  04/18/14 1404  BNP 235.4*    CBG:  Recent Labs Lab 04/18/14 2041 04/19/14 0129 04/19/14 0420 04/19/14 0752 04/19/14 1306  GLUCAP 208* 146* 125* 122* 122*    Recent Results (from the past 240 hour(s))  Blood Culture (routine x 2)     Status: None (Preliminary result)   Collection Time: 04/18/14  3:20 PM  Result Value Ref Range Status   Specimen Description BLOOD LEFT ANTECUBITAL  Final   Special Requests BOTTLES DRAWN AEROBIC AND ANAEROBIC 10MLS  Final    Culture   Final           BLOOD CULTURE RECEIVED NO GROWTH TO DATE CULTURE WILL BE HELD FOR 5 DAYS BEFORE ISSUING A FINAL NEGATIVE REPORT Performed at [facility]    Report Status PENDING  Incomplete  Blood Culture (routine x 2)     Status: None (Preliminary result)   Collection Time: 04/18/14  3:30 PM  Result Value Ref Range Status   Specimen Description BLOOD LEFT ARM  Final   Special Requests BOTTLES DRAWN AEROBIC AND ANAEROBIC 10MLS  Final   Culture   Final           BLOOD CULTURE RECEIVED NO GROWTH TO DATE CULTURE WILL BE HELD FOR 5 DAYS BEFORE ISSUING A FINAL NEGATIVE REPORT Performed at [facility]    Report Status PENDING  Incomplete  MRSA PCR Screening     Status: None   Collection Time: 04/18/14  8:51 PM  Result Value Ref Range Status   MRSA by PCR NEGATIVE NEGATIVE Final    Comment:        The GeneXpert MRSA Assay (FDA approved for NASAL specimens only), is one component of a comprehensive MRSA colonization surveillance program. It is not intended to diagnose MRSA infection nor to guide or monitor treatment for MRSA infections.      Studies:  Recent x-ray studies have been reviewed in detail by the Attending Physician  Scheduled Meds:  Scheduled Meds: . aspirin EC  325 mg Oral Daily  . cefTRIAXone (ROCEPHIN)  IV  Peter Peter Intravenous Q24H  . donepezil  10 mg Oral QHS  . folic acid  Peter mg Oral Daily  . gabapentin  400 mg Oral TID  . heparin  5,000 Units Subcutaneous 3 times per day  . insulin aspart  0-15 Units Subcutaneous 6 times per day  . multivitamin with minerals  Peter tablet Oral Daily  . pantoprazole  40 mg Oral Daily  . simvastatin  20 mg Oral Daily  . sodium chloride  3 mL Intravenous Q12H  . thiamine  100 mg Oral Daily  . venlafaxine XR  150 mg Oral Q breakfast    Time spent on care of this patient: 35 mins   Tayonna Bacha T , MD   [facility] Office  [phone] Pager - Text Page per Loretha StaplerAmion as per  below:  On-Call/Text Page:      [email or web address]      password [email or web address]  If 7PM-7AM, please contact night-coverage [email or web address] Password [email or web address] 04/19/2014, Peter:40 PM   LOS: Peter day

## 2014-04-19 NOTE — Care Management Note (Signed)
    Page 1 of 1   04/19/2014     10:49:17 AM CARE MANAGEMENT NOTE 04/19/2014  Patient:  Peter Weiss,Peter Weiss   Account Number:  [id number]  Date Initiated:  04/19/2014  Documentation initiated by:  Tylena Prisk  Subjective/Objective Assessment:   dx syncope/hypotension; lives with friend    PCP  Naomie Deanarrie Doss     DC Planning Services  CM consult      Status of service:  In process, will continue to follow  Per UR Regulation:  Reviewed for med. necessity/level of care/duration of stay

## 2014-04-19 NOTE — Progress Notes (Signed)
  Echocardiogram 2D Echocardiogram has been performed.  Janalyn HarderWest, Sybel Standish R 04/19/2014, 1:05 PM

## 2014-04-20 ENCOUNTER — Inpatient Hospital Stay (HOSPITAL_COMMUNITY): Payer: Medicare Other

## 2014-04-20 DIAGNOSIS — I350 Nonrheumatic aortic (valve) stenosis: Secondary | ICD-10-CM

## 2014-04-20 LAB — URINE CULTURE

## 2014-04-20 LAB — CBC
HCT: 30.4 % — ABNORMAL LOW (ref 39.0–52.0)
Hemoglobin: 9.7 g/dL — ABNORMAL LOW (ref 13.0–17.0)
MCH: 29.7 pg (ref 26.0–34.0)
MCHC: 31.9 g/dL (ref 30.0–36.0)
MCV: 93 fL (ref 78.0–100.0)
Platelets: 159 10*3/uL (ref 150–400)
RBC: 3.27 MIL/uL — AB (ref 4.22–5.81)
RDW: 13.6 % (ref 11.5–15.5)
WBC: 7.7 10*3/uL (ref 4.0–10.5)

## 2014-04-20 LAB — BASIC METABOLIC PANEL
Anion gap: 6 (ref 5–15)
BUN: 27 mg/dL — ABNORMAL HIGH (ref 6–23)
CALCIUM: 8.5 mg/dL (ref 8.4–10.5)
CO2: 25 mmol/L (ref 19–32)
CREATININE: 1.22 mg/dL (ref 0.50–1.35)
Chloride: 109 mmol/L (ref 96–112)
GFR calc Af Amer: 67 mL/min — ABNORMAL LOW (ref >90)
GFR, EST NON AFRICAN AMERICAN: 58 mL/min — AB (ref >90)
Glucose, Bld: 139 mg/dL — ABNORMAL HIGH (ref 70–99)
POTASSIUM: 3.4 mmol/L — AB (ref 3.5–5.1)
Sodium: 140 mmol/L (ref 135–145)

## 2014-04-20 LAB — GLUCOSE, CAPILLARY
Glucose-Capillary: 113 mg/dL — ABNORMAL HIGH (ref 70–99)
Glucose-Capillary: 142 mg/dL — ABNORMAL HIGH (ref 70–99)
Glucose-Capillary: 118 mg/dL — ABNORMAL HIGH (ref 70–99)
Glucose-Capillary: 175 mg/dL — ABNORMAL HIGH (ref 70–99)

## 2014-04-20 LAB — HEMOGLOBIN A1C
Hgb A1c MFr Bld: 7 % — ABNORMAL HIGH (ref 4.8–5.6)
Mean Plasma Glucose: 154 mg/dL

## 2014-04-20 LAB — OCCULT BLOOD X 1 CARD TO LAB, STOOL: Fecal Occult Bld: NEGATIVE

## 2014-04-20 MED ORDER — OXYCODONE HCL 5 MG PO TABS
10.0000 mg | ORAL_TABLET | ORAL | Status: DC | PRN
  Administered 2014-04-20 – 2014-04-22 (×5): 15 mg via ORAL
  Administered 2014-04-22: 10 mg via ORAL
  Administered 2014-04-22 – 2014-04-24 (×5): 15 mg via ORAL
  Administered 2014-04-24 – 2014-04-25 (×3): 10 mg via ORAL
  Filled 2014-04-20 (×4): qty 3
  Filled 2014-04-20: qty 2
  Filled 2014-04-20 (×2): qty 3
  Filled 2014-04-20 (×3): qty 2
  Filled 2014-04-20: qty 3
  Filled 2014-04-20: qty 2
  Filled 2014-04-20 (×2): qty 3

## 2014-04-20 MED ORDER — PNEUMOCOCCAL VAC POLYVALENT 25 MCG/0.5ML IJ INJ
0.5000 mL | INJECTION | INTRAMUSCULAR | Status: AC
  Administered 2014-04-21: 0.5 mL via INTRAMUSCULAR
  Filled 2014-04-20: qty 0.5

## 2014-04-20 MED ORDER — POLYETHYLENE GLYCOL 3350 17 G PO PACK
17.0000 g | PACK | Freq: Every day | ORAL | Status: DC
  Administered 2014-04-20 – 2014-04-22 (×4): 17 g via ORAL
  Filled 2014-04-20 (×8): qty 1

## 2014-04-20 MED ORDER — CETYLPYRIDINIUM CHLORIDE 0.05 % MT LIQD
7.0000 mL | Freq: Two times a day (BID) | OROMUCOSAL | Status: DC
  Administered 2014-04-20 – 2014-04-25 (×8): 7 mL via OROMUCOSAL

## 2014-04-20 MED ORDER — POTASSIUM CHLORIDE CRYS ER 20 MEQ PO TBCR
40.0000 meq | EXTENDED_RELEASE_TABLET | Freq: Once | ORAL | Status: AC
  Administered 2014-04-20: 40 meq via ORAL
  Filled 2014-04-20: qty 2

## 2014-04-20 MED ORDER — INFLUENZA VAC SPLIT QUAD 0.5 ML IM SUSY
0.5000 mL | PREFILLED_SYRINGE | INTRAMUSCULAR | Status: AC
  Administered 2014-04-21: 0.5 mL via INTRAMUSCULAR
  Filled 2014-04-20: qty 0.5

## 2014-04-20 NOTE — Consult Note (Signed)
Reason for Consult:Aortic stenosis Referring Physician: Dr. Thereasa Solo, Dr. Gollan(Cardiology)  Peter Weiss is an 74 y.o. male.  HPI: 74 yo man admitted after a syncopal spell  Peter Weiss is a 74 yo man with multiple medical problems, including known AS. He has memory loss and is a poor historian. Apparently he has been having issues with falls for some time. He "blacked out" on the day of admission. He was brought to the ED where he was noted to altered mental status, was febrile to 100.4, and was dehydrated. Troponin was elevated. He was also noted to be in atrial fibrillation.  He had an echo which showed critical aortic stenosis. He had catheterization in January which showed no significant coronary artery disease.  Past Medical History  Diagnosis Date  . Arthritis   . Chicken pox   . Heart murmur   . Hypertension   . Hyperlipidemia   . Urinary incontinence   . History of fainting spells of unknown cause   . Diabetes mellitus without complication   . Depression     Past Surgical History  Procedure Laterality Date  . Goiter  2008    Family History  Problem Relation Age of Onset  . Alcohol abuse Mother   . Heart disease Mother   . Diabetes Mother   . Heart disease Father   . Diabetes Father   . Cancer Mother   . Cancer Father   . Alcohol abuse Father   . Alcohol abuse Brother     Social History:  reports that he has never smoked. He has never used smokeless tobacco. He reports that he does not drink alcohol or use illicit drugs.  Allergies: No Known Allergies  Medications:  Scheduled: . antiseptic oral rinse  7 mL Mouth Rinse BID  . aspirin EC  325 mg Oral Daily  . donepezil  10 mg Oral QHS  . folic acid  1 mg Oral Daily  . gabapentin  400 mg Oral TID  . heparin  5,000 Units Subcutaneous 3 times per day  . [START ON 04/21/2014] Influenza vac split quadrivalent PF  0.5 mL Intramuscular Tomorrow-1000  . insulin aspart  0-15 Units Subcutaneous TID WC  .  multivitamin with minerals  1 tablet Oral Daily  . pantoprazole  40 mg Oral Daily  . [START ON 04/21/2014] pneumococcal 23 valent vaccine  0.5 mL Intramuscular Tomorrow-1000  . polyethylene glycol  17 g Oral Daily  . potassium chloride  40 mEq Oral Once  . simvastatin  20 mg Oral Daily  . sodium chloride  3 mL Intravenous Q12H  . tamsulosin  0.4 mg Oral Daily  . thiamine  100 mg Oral Daily  . venlafaxine XR  150 mg Oral Q breakfast    Results for orders placed or performed during the hospital encounter of 04/18/14 (from the past 48 hour(s))  Blood Culture (routine x 2)     Status: None (Preliminary result)   Collection Time: 04/18/14  3:20 PM  Result Value Ref Range   Specimen Description BLOOD LEFT ANTECUBITAL    Special Requests BOTTLES DRAWN AEROBIC AND ANAEROBIC 10MLS    Culture             BLOOD CULTURE RECEIVED NO GROWTH TO DATE CULTURE WILL BE HELD FOR 5 DAYS BEFORE ISSUING A FINAL NEGATIVE REPORT Performed at [facility]    Report Status PENDING   Ammonia     Status: None   Collection Time: 04/18/14  3:22 PM  Result Value Ref Range   Ammonia 16 11 - 32 umol/L  Blood Culture (routine x 2)     Status: None (Preliminary result)   Collection Time: 04/18/14  3:30 PM  Result Value Ref Range   Specimen Description BLOOD LEFT ARM    Special Requests BOTTLES DRAWN AEROBIC AND ANAEROBIC 10MLS    Culture             BLOOD CULTURE RECEIVED NO GROWTH TO DATE CULTURE WILL BE HELD FOR 5 DAYS BEFORE ISSUING A FINAL NEGATIVE REPORT Performed at [facility]    Report Status PENDING   Ethanol     Status: None   Collection Time: 04/18/14  3:30 PM  Result Value Ref Range   Alcohol, Ethyl (B) <5 0 - 9 mg/dL    Comment:        LOWEST DETECTABLE LIMIT FOR SERUM ALCOHOL IS 11 mg/dL FOR MEDICAL PURPOSES ONLY   Urinalysis, Routine w reflex microscopic     Status: Abnormal   Collection Time: 04/18/14  4:00 PM  Result Value Ref Range   Color, Urine YELLOW YELLOW    APPearance CLEAR CLEAR   Specific Gravity, Urine 1.021 1.005 - 1.030   pH 5.0 5.0 - 8.0   Glucose, UA NEGATIVE NEGATIVE mg/dL   Hgb urine dipstick NEGATIVE NEGATIVE   Bilirubin Urine SMALL (A) NEGATIVE   Ketones, ur NEGATIVE NEGATIVE mg/dL   Protein, ur NEGATIVE NEGATIVE mg/dL   Urobilinogen, UA 1.0 0.0 - 1.0 mg/dL   Nitrite NEGATIVE NEGATIVE   Leukocytes, UA NEGATIVE NEGATIVE    Comment: MICROSCOPIC NOT DONE ON URINES WITH NEGATIVE PROTEIN, BLOOD, LEUKOCYTES, NITRITE, OR GLUCOSE <1000 mg/dL.  Urine culture     Status: None   Collection Time: 04/18/14  4:00 PM  Result Value Ref Range   Specimen Description URINE, CLEAN CATCH    Special Requests NONE    Colony Count      6,000 COLONIES/ML Performed at [facility]    Culture      INSIGNIFICANT GROWTH Performed at [facility]    Report Status 04/20/2014 FINAL   Drug screen panel, emergency     Status: Abnormal   Collection Time: 04/18/14  4:00 PM  Result Value Ref Range   Opiates POSITIVE (A) NONE DETECTED   Cocaine NONE DETECTED NONE DETECTED   Benzodiazepines POSITIVE (A) NONE DETECTED   Amphetamines NONE DETECTED NONE DETECTED   Tetrahydrocannabinol NONE DETECTED NONE DETECTED   Barbiturates NONE DETECTED NONE DETECTED    Comment:        DRUG SCREEN FOR MEDICAL PURPOSES ONLY.  IF CONFIRMATION IS NEEDED FOR ANY PURPOSE, NOTIFY LAB WITHIN 5 DAYS.        LOWEST DETECTABLE LIMITS FOR URINE DRUG SCREEN Drug Class       Cutoff (ng/mL) Amphetamine      1000 Barbiturate      200 Benzodiazepine   902 Tricyclics       409 Opiates          300 Cocaine          300 THC              50   Glucose, capillary     Status: Abnormal   Collection Time: 04/18/14  8:41 PM  Result Value Ref Range   Glucose-Capillary 208 (H) 70 - 99 mg/dL  MRSA PCR Screening     Status: None   Collection Time: 04/18/14  8:51 PM  Result Value  Ref Range   MRSA by PCR NEGATIVE NEGATIVE    Comment:        The GeneXpert MRSA Assay  (FDA approved for NASAL specimens only), is one component of a comprehensive MRSA colonization surveillance program. It is not intended to diagnose MRSA infection nor to guide or monitor treatment for MRSA infections.   TSH     Status: None   Collection Time: 04/18/14  9:14 PM  Result Value Ref Range   TSH 0.897 0.350 - 4.500 uIU/mL  Troponin I     Status: Abnormal   Collection Time: 04/18/14  9:14 PM  Result Value Ref Range   Troponin I 0.06 (H) <0.031 ng/mL    Comment:        PERSISTENTLY INCREASED TROPONIN VALUES IN THE RANGE OF 0.04-0.49 ng/mL CAN BE SEEN IN:       -UNSTABLE ANGINA       -CONGESTIVE HEART FAILURE       -MYOCARDITIS       -CHEST TRAUMA       -ARRYHTHMIAS       -LATE PRESENTING MYOCARDIAL INFARCTION       -COPD   CLINICAL FOLLOW-UP RECOMMENDED.   Hemoglobin A1c     Status: Abnormal   Collection Time: 04/18/14  9:14 PM  Result Value Ref Range   Hgb A1c MFr Bld 7.0 (H) 4.8 - 5.6 %    Comment: (NOTE)         Pre-diabetes: 5.7 - 6.4         Diabetes: >6.4         Glycemic control for adults with diabetes: <7.0    Mean Plasma Glucose 154 mg/dL    Comment: (NOTE) Performed At: [facility] [place], [place] [id number] Lindon Romp MD VZ:[id number]   Glucose, capillary     Status: Abnormal   Collection Time: 04/19/14  1:29 AM  Result Value Ref Range   Glucose-Capillary 146 (H) 70 - 99 mg/dL  Troponin I     Status: Abnormal   Collection Time: 04/19/14  3:27 AM  Result Value Ref Range   Troponin I 0.06 (H) <0.031 ng/mL    Comment:        PERSISTENTLY INCREASED TROPONIN VALUES IN THE RANGE OF 0.04-0.49 ng/mL CAN BE SEEN IN:       -UNSTABLE ANGINA       -CONGESTIVE HEART FAILURE       -MYOCARDITIS       -CHEST TRAUMA       -ARRYHTHMIAS       -LATE PRESENTING MYOCARDIAL INFARCTION       -COPD   CLINICAL FOLLOW-UP RECOMMENDED.   Glucose, capillary     Status: Abnormal   Collection Time: 04/19/14  4:20 AM  Result  Value Ref Range   Glucose-Capillary 125 (H) 70 - 99 mg/dL  Troponin I     Status: Abnormal   Collection Time: 04/19/14  7:50 AM  Result Value Ref Range   Troponin I 0.05 (H) <0.031 ng/mL    Comment:        PERSISTENTLY INCREASED TROPONIN VALUES IN THE RANGE OF 0.04-0.49 ng/mL CAN BE SEEN IN:       -UNSTABLE ANGINA       -CONGESTIVE HEART FAILURE       -MYOCARDITIS       -CHEST TRAUMA       -ARRYHTHMIAS       -LATE PRESENTING MYOCARDIAL INFARCTION       -  COPD   CLINICAL FOLLOW-UP RECOMMENDED.   Comprehensive metabolic panel     Status: Abnormal   Collection Time: 04/19/14  7:50 AM  Result Value Ref Range   Sodium 139 135 - 145 mmol/L   Potassium 3.8 3.5 - 5.1 mmol/L   Chloride 104 96 - 112 mmol/L   CO2 25 19 - 32 mmol/L   Glucose, Bld 127 (H) 70 - 99 mg/dL   BUN 34 (H) 6 - 23 mg/dL   Creatinine, Ser 1.69 (H) 0.50 - 1.35 mg/dL   Calcium 8.5 8.4 - 10.5 mg/dL   Total Protein 6.0 6.0 - 8.3 g/dL   Albumin 3.3 (L) 3.5 - 5.2 g/dL   AST 22 0 - 37 U/L   ALT 14 0 - 53 U/L   Alkaline Phosphatase 54 39 - 117 U/L   Total Bilirubin 0.7 0.3 - 1.2 mg/dL   GFR calc non Af Amer 39 (L) >90 mL/min   GFR calc Af Amer 45 (L) >90 mL/min    Comment: (NOTE) The eGFR has been calculated using the CKD EPI equation. This calculation has not been validated in all clinical situations. eGFR's persistently <90 mL/min signify possible Chronic Kidney Disease.    Anion gap 10 5 - 15  CBC     Status: Abnormal   Collection Time: 04/19/14  7:50 AM  Result Value Ref Range   WBC 5.9 4.0 - 10.5 K/uL   RBC 3.44 (L) 4.22 - 5.81 MIL/uL   Hemoglobin 10.0 (L) 13.0 - 17.0 g/dL   HCT 31.8 (L) 39.0 - 52.0 %   MCV 92.4 78.0 - 100.0 fL   MCH 29.1 26.0 - 34.0 pg   MCHC 31.4 30.0 - 36.0 g/dL   RDW 13.6 11.5 - 15.5 %   Platelets 182 150 - 400 K/uL  Protime-INR     Status: Abnormal   Collection Time: 04/19/14  7:50 AM  Result Value Ref Range   Prothrombin Time 15.3 (H) 11.6 - 15.2 seconds   INR 1.20 0.00 - 1.49   Glucose, capillary     Status: Abnormal   Collection Time: 04/19/14  7:52 AM  Result Value Ref Range   Glucose-Capillary 122 (H) 70 - 99 mg/dL  Glucose, capillary     Status: Abnormal   Collection Time: 04/19/14  1:06 PM  Result Value Ref Range   Glucose-Capillary 122 (H) 70 - 99 mg/dL   Comment 1 Notify RN   Glucose, capillary     Status: Abnormal   Collection Time: 04/19/14  5:22 PM  Result Value Ref Range   Glucose-Capillary 139 (H) 70 - 99 mg/dL  Glucose, capillary     Status: Abnormal   Collection Time: 04/19/14  8:09 PM  Result Value Ref Range   Glucose-Capillary 100 (H) 70 - 99 mg/dL  Basic metabolic panel     Status: Abnormal   Collection Time: 04/20/14  3:27 AM  Result Value Ref Range   Sodium 140 135 - 145 mmol/L   Potassium 3.4 (L) 3.5 - 5.1 mmol/L   Chloride 109 96 - 112 mmol/L   CO2 25 19 - 32 mmol/L   Glucose, Bld 139 (H) 70 - 99 mg/dL   BUN 27 (H) 6 - 23 mg/dL   Creatinine, Ser 1.22 0.50 - 1.35 mg/dL   Calcium 8.5 8.4 - 10.5 mg/dL   GFR calc non Af Amer 58 (L) >90 mL/min   GFR calc Af Amer 67 (L) >90 mL/min    Comment: (NOTE) The  eGFR has been calculated using the CKD EPI equation. This calculation has not been validated in all clinical situations. eGFR's persistently <90 mL/min signify possible Chronic Kidney Disease.    Anion gap 6 5 - 15  CBC     Status: Abnormal   Collection Time: 04/20/14  3:27 AM  Result Value Ref Range   WBC 7.7 4.0 - 10.5 K/uL   RBC 3.27 (L) 4.22 - 5.81 MIL/uL   Hemoglobin 9.7 (L) 13.0 - 17.0 g/dL   HCT 30.4 (L) 39.0 - 52.0 %   MCV 93.0 78.0 - 100.0 fL   MCH 29.7 26.0 - 34.0 pg   MCHC 31.9 30.0 - 36.0 g/dL   RDW 13.6 11.5 - 15.5 %   Platelets 159 150 - 400 K/uL  Glucose, capillary     Status: Abnormal   Collection Time: 04/20/14  8:04 AM  Result Value Ref Range   Glucose-Capillary 113 (H) 70 - 99 mg/dL  Occult blood card to lab, stool     Status: None   Collection Time: 04/20/14  1:03 PM  Result Value Ref Range   Fecal  Occult Bld NEGATIVE NEGATIVE  Glucose, capillary     Status: Abnormal   Collection Time: 04/20/14  1:12 PM  Result Value Ref Range   Glucose-Capillary 142 (H) 70 - 99 mg/dL    Dg Chest 2 View  04/18/2014   CLINICAL DATA:  Shortness of breath for 1 day  EXAM: CHEST  2 VIEW  COMPARISON:  Chest x-ray of 03/21/2014  FINDINGS: No active infiltrate or effusion is seen. Cardiomegaly is stable. Mild eventration at the right lung base is unchanged. The bones are osteopenic.  IMPRESSION: Stable cardiomegaly.  No active lung disease.   Electronically Signed   By: Ivar Drape M.D.   On: 04/18/2014 17:14   Nm Pulmonary Perf And Vent  04/18/2014   CLINICAL DATA:  Dyspnea, history hypertension, diabetes, heart murmur  EXAM: NUCLEAR MEDICINE VENTILATION - PERFUSION LUNG SCAN  TECHNIQUE: Ventilation images were obtained in multiple projections using inhaled aerosol technetium 99 M DTPA. Perfusion images were obtained in multiple projections after intravenous injection of Tc-25mMAA.  RADIOPHARMACEUTICALS:  40 mCi Tc-950mTPA aerosol and 6 mCi Tc-9961mA  COMPARISON:  None; correlation chest radiograph 04/18/2014  FINDINGS: Ventilation: Enlargement of cardiac silhouette.  Otherwise normal.  Perfusion: Enlargement of cardiac silhouette.  Otherwise normal.  Chest radiograph: Enlargement of cardiac silhouette  IMPRESSION: Cardiomegaly.  Otherwise normal ventilation and perfusion lung scan.   Electronically Signed   By: MarLavonia DanaD.   On: 04/18/2014 19:55    Review of Systems  Constitutional: Positive for malaise/fatigue. Negative for fever.  HENT: Positive for congestion and sore throat.        Poor dentition  Respiratory: Positive for shortness of breath.   Cardiovascular: Positive for orthopnea and leg swelling. Negative for chest pain.  Musculoskeletal: Positive for back pain and joint pain.       Chronic pain requiring narcotics  Neurological: Positive for dizziness (+syncope) and loss of consciousness.        Memory loss  Psychiatric/Behavioral: The patient is nervous/anxious.   All other systems reviewed and are negative.  Blood pressure 141/92, pulse 92, temperature 98.7 F (37.1 C), temperature source Oral, resp. rate 17, height 5' 11"  (1.803 m), weight 256 lb 9.9 oz (116.4 kg), SpO2 94 %. Physical Exam  Vitals reviewed. Constitutional:  obese  HENT:  Head: Normocephalic and atraumatic.  Poor dentition with multiple  missing teeth  Eyes: EOM are normal.  Neck: Neck supple. No thyromegaly present.  Transmitted murmur B carotid  Cardiovascular:  Murmur (3/6 crescendo/ decrescendo) heard. Irregularly irregular  Respiratory: He has no wheezes. He has rales.  Diminished BS both bases  GI: Soft. There is no tenderness.  Musculoskeletal: He exhibits edema (1+ bilateral).  Lymphadenopathy:    He has no cervical adenopathy.  Neurological: He is alert. No cranial nerve deficit.  Oriented to person,place, not date  Skin: Skin is warm and dry. Rash (left ankle) noted.   Echocardiogram Study Conclusions  - Left ventricle: The cavity size was normal. Wall thickness was increased in a pattern of moderate LVH. Systolic function was normal. The estimated ejection fraction was in the range of 55% to 60%. Wall motion was normal; there were no regional wall motion abnormalities. Doppler parameters are consistent with high ventricular filling pressure. - Aortic valve: Valve mobility was restricted. There was severe stenosis. There was trivial regurgitation. Valve area (VTI): 0.56 cm^2. Valve area (Vmax): 0.61 cm^2. Valve area (Vmean): 0.56 cm^2. - Aortic root: The aortic root was moderately dilated. - Mitral valve: Calcified annulus. - Left atrium: The atrium was severely dilated. - Right atrium: The atrium was mildly dilated.  I reviewed his  Echo personally   Assessment/Plan: 74 yo man with aortic stenosis who presented after a syncopal spell. He has symptomatic critical  aortic stenosis and aortic valve replacement is indicated for survival benefit and relief of symptoms. In addition, he is in atrial fibrillation. He is a poor candidate for anticoagulation given his memory issues and falls.  He has multiple other medical problems including diabetes and early dementia(? Underlying cause). He also has chronic pain for which he takes high doses of oxycodone. He apparently has some difficulties with his gait, which I was not able to evaluate today. His creatinine was elevated on admission but improved with hydration.  He also has very poor dental hygiene. He has not seen a dentist in recent memory. He needs an orthopantogram and a dental consult. I will order those.  I did discuss possible aortic valve replacement +/- maze procedure with Peter Weiss. I reviewed the indications, risks, benefits and alternatives. I informed him of the risks of death, MI, stroke, worsened memory loss, bleeding, possible need for transfusion, infection, heart block, renal or respiratory failure, as well as the possibility of other unforeseeable complications. He is not a candidate for a mechanical valve but a tissue valve is appropriate given his age.  Will follow with you.  Revonda Standard Roxan Hockey, [place] C 04/20/2014, 2:56 PM

## 2014-04-20 NOTE — Progress Notes (Signed)
[place] TEAM 1 - Stepdown/ICU TEAM Progress Note  Peter Weiss [id number]:[id number] DOB: [birth year] DOA: 04/18/2014 PCP: Carollee Leitzoss, Carrie M, NP  Admit HPI / Brief Narrative: 74 y.o. male who presented after a syncopal episode. He was confused with his days. He stated his memory has been poor. Patient stated he was going to stand up and he passed out. He had no chest pain. He had noted dizziness when he stands up. He stated he had a recent cardiac cath which he thought was "a few days ago" but in reality he had it done in January. [facility] he had the initial cardiac event. Patient has a significant history of aortic stenosis. Patient was noted to have a low BP in the ED.   HPI/Subjective: Pt c/o ongoing chronic pain in his legs and back.  Asks that his pain meds be increased "I was taking 20mg  4x a day at home."  He denies cp, n/v, sob, or dizziness.    Assessment/Plan:  Syncope  Due to critical AoS + DH + BP meds   Critical AoS [facility] suggests very tight valve / inability to cross w/ catheter - TTE notes valve area of ~0.6cm2 and gradient of ~6860mmHg - I discussed w/ Dr. Excell Seltzerooper, who states he is likely a better candidate for open surgical valve than TAVR - consult called to TCTS for evaluation   Hypot[facility] w/ a h[facility]p meds in the setting of critical AoS   Acute kidney injury  Resolved w/ volume resuscitation - [facility]   Modestly elevated temp Tmax 100.4 since admit - no leukocytosis - UA unrevealing - CXR w/o infiltrate - no clear indication for abx at this time   HLD Cont Zocor   DM CBG well controlled - follow trend  Dementia  Appears to cary a diagnosis of dementia as he is on aricept chronically -    Code Status: FULL Family Communication: no family present at time of exam Disposition Plan: SDU   Consultants: TCTS  Procedures: 2/5 carotid dopplers - 40-59% R ICA stenosis - 1-39% L ICA  stenosis - vertebral flow antegrade  2/5 TTE - EF 55-60% - moderate LVH - severe AS (gradient 62mmHg)  Antibiotics: Rocephin 2/4 > 2/5  DVT prophylaxis: SQ heparin  Objective: Blood pressure 141/92, pulse 92, temperature 98.7 F (37.1 C), temperature source Oral, resp. rate 17, height 5\' 11"  (1.803 m), weight 116.4 kg (256 lb 9.9 oz), SpO2 94 %.  Intake/Output Summary (Last 24 hours) at 04/20/14 1411 Last data filed at 04/20/14 1400  Gross per 24 hour  Intake   1800 ml  Output   1201 ml  Net    599 ml   Exam: General: No acute respiratory distress Lungs: Clear to auscultation bilaterally without wheezes or crackles Cardiovascular: frequent ectopic beats - 4/6 holosystolic M Abdomen: Nontender, nondistended, soft, bowel sounds positive, no rebound, no ascites, no appreciable mass Extremities: No significant cyanosis, clubbing, or edema bilateral lower extremities  Data Reviewed: Basic Metabolic Panel:  Recent Labs Lab 04/18/14 1404 04/19/14 0750 04/20/14 0327  NA 138 139 140  K 3.8 3.8 3.4*  CL 99 104 109  CO2 28 25 25   GLUCOSE 132* 127* 139*  BUN 30* 34* 27*  CREATININE 2.49* 1.69* 1.22  CALCIUM 9.5 8.5 8.5    Liver Function Tests:  Recent Labs Lab 04/18/14 1404 04/19/14 0750  AST 25 22  ALT 16 14  ALKPHOS 72 54  BILITOT 0.6 0.7  PROT 6.9 6.0  ALBUMIN 4.1 3.3*    Recent Labs Lab 04/18/14 1522  AMMONIA 16   Coags:  Recent Labs Lab 04/19/14 0750  INR 1.20   CBC:  Recent Labs Lab 04/18/14 1404 04/19/14 0750 04/20/14 0327  WBC 8.0 5.9 7.7  NEUTROABS 5.9  --   --   HGB 11.8* 10.0* 9.7*  HCT 38.2* 31.8* 30.4*  MCV 93.2 92.4 93.0  PLT 203 182 159    Cardiac Enzymes:  Recent Labs Lab 04/18/14 2114 04/19/14 0327 04/19/14 0750  TROPONINI 0.06* 0.06* 0.05*   BNP (last 3 results)  Recent Labs  04/18/14 1404  BNP 235.4*    CBG:  Recent Labs Lab 04/19/14 1306 04/19/14 1722 04/19/14 2009 04/20/14 0804 04/20/14 1312    GLUCAP 122* 139* 100* 113* 142*    Recent Results (from the past 240 hour(s))  Blood Culture (routine x 2)     Status: None (Preliminary result)   Collection Time: 04/18/14  3:20 PM  Result Value Ref Range Status   Specimen Description BLOOD LEFT ANTECUBITAL  Final   Special Requests BOTTLES DRAWN AEROBIC AND ANAEROBIC  Final   Culture   Final           BLOOD CULTURE RECEIVED NO GROWTH TO DATE CULTURE WILL BE HELD FOR 5 DAYS BEFORE ISSUING A FINAL NEGATIVE REPORT Performed at [facility]    Report Status PENDING  Incomplete  Blood Culture (routine x 2)     Status: None (Preliminary result)   Collection Time: 04/18/14  3:30 PM  Result Value Ref Range Status   Specimen Description BLOOD LEFT ARM  Final   Special Requests BOTTLES DRAWN AEROBIC AND ANAEROBIC  Final   Culture   Final           BLOOD CULTURE RECEIVED NO GROWTH TO DATE CULTURE WILL BE HELD FOR 5 DAYS BEFORE ISSUING A FINAL NEGATIVE REPORT Performed at [facility]    Report Status PENDING  Incomplete  Urine culture     Status: None   Collection Time: 04/18/14  4:00 PM  Result Value Ref Range Status   Specimen Description URINE, CLEAN CATCH  Final   Special Requests NONE  Final   Colony Count   Final    6,000 COLONIES/ML Performed at [facility]    Culture   Final    INSIGNIFICANT GROWTH Performed at [facility]    Report Status 04/20/2014 FINAL  Final  MRSA PCR Screening     Status: None   Collection Time: 04/18/14  8:51 PM  Result Value Ref Range Status   MRSA by PCR NEGATIVE NEGATIVE Final    Comment:        The GeneXpert MRSA Assay (FDA approved for NASAL specimens only), is one component of a comprehensive MRSA colonization surveillance program. It is not intended to diagnose MRSA infection nor to guide or monitor treatment for MRSA infections.      Studies:  Recent x-ray studies have been reviewed in detail by the Attending Physician  Scheduled  Meds:  Scheduled Meds: . antiseptic oral rinse  7 mL Mouth Rinse BID  . aspirin EC  325 mg Oral Daily  . donepezil  10 mg Oral QHS  . folic acid  1 mg Oral Daily  . gabapentin  400 mg Oral TID  . heparin  5,000 Units Subcutaneous  3 times per day  . [START ON 04/21/2014] Influenza vac split quadrivalent PF  0.5 mL Intramuscular Tomorrow-1000  . insulin aspart  0-15 Units Subcutaneous TID WC  . multivitamin with minerals  1 tablet Oral Daily  . pantoprazole  40 mg Oral Daily  . [START ON 04/21/2014] pneumococcal 23 valent vaccine  0.5 mL Intramuscular Tomorrow-1000  . polyethylene glycol  17 g Oral Daily  . simvastatin  20 mg Oral Daily  . sodium chloride  3 mL Intravenous Q12H  . tamsulosin  0.4 mg Oral Daily  . thiamine  100 mg Oral Daily  . venlafaxine XR  150 mg Oral Q breakfast    Time spent on care of this patient: 35 mins   Diem Dicocco T , MD   [facility] Office  [phone] Pager - Text Page per Loretha Stapler as per below:  On-Call/Text Page:      [email or web address]      password [email or web address]  If 7PM-7AM, please contact night-coverage [email or web address] Password [email or web address] 04/20/2014, 2:11 PM   LOS: 2 days

## 2014-04-20 NOTE — Progress Notes (Signed)
Patient is refusing to wear CPAP at this time. RT informed patient that if he changed his mind to have nurse contact RT and we would place him on it.

## 2014-04-21 DIAGNOSIS — E860 Dehydration: Secondary | ICD-10-CM | POA: Diagnosis not present

## 2014-04-21 DIAGNOSIS — F0390 Unspecified dementia without behavioral disturbance: Secondary | ICD-10-CM | POA: Diagnosis not present

## 2014-04-21 DIAGNOSIS — I252 Old myocardial infarction: Secondary | ICD-10-CM | POA: Diagnosis not present

## 2014-04-21 DIAGNOSIS — E8770 Fluid overload, unspecified: Secondary | ICD-10-CM | POA: Diagnosis not present

## 2014-04-21 DIAGNOSIS — E785 Hyperlipidemia, unspecified: Secondary | ICD-10-CM | POA: Diagnosis not present

## 2014-04-21 DIAGNOSIS — N179 Acute kidney failure, unspecified: Secondary | ICD-10-CM | POA: Diagnosis not present

## 2014-04-21 DIAGNOSIS — G8929 Other chronic pain: Secondary | ICD-10-CM | POA: Diagnosis not present

## 2014-04-21 DIAGNOSIS — K045 Chronic apical periodontitis: Secondary | ICD-10-CM | POA: Diagnosis not present

## 2014-04-21 DIAGNOSIS — I10 Essential (primary) hypertension: Secondary | ICD-10-CM | POA: Diagnosis not present

## 2014-04-21 DIAGNOSIS — I482 Chronic atrial fibrillation: Secondary | ICD-10-CM | POA: Diagnosis not present

## 2014-04-21 DIAGNOSIS — F329 Major depressive disorder, single episode, unspecified: Secondary | ICD-10-CM | POA: Diagnosis not present

## 2014-04-21 DIAGNOSIS — E119 Type 2 diabetes mellitus without complications: Secondary | ICD-10-CM | POA: Diagnosis not present

## 2014-04-21 DIAGNOSIS — Z9181 History of falling: Secondary | ICD-10-CM | POA: Diagnosis not present

## 2014-04-21 DIAGNOSIS — K029 Dental caries, unspecified: Secondary | ICD-10-CM | POA: Diagnosis not present

## 2014-04-21 DIAGNOSIS — I350 Nonrheumatic aortic (valve) stenosis: Secondary | ICD-10-CM | POA: Diagnosis not present

## 2014-04-21 LAB — BASIC METABOLIC PANEL
Anion gap: 4 — ABNORMAL LOW (ref 5–15)
BUN: 18 mg/dL (ref 6–23)
CO2: 27 mmol/L (ref 19–32)
Calcium: 8.5 mg/dL (ref 8.4–10.5)
Chloride: 109 mmol/L (ref 96–112)
Creatinine, Ser: 1.1 mg/dL (ref 0.50–1.35)
GFR calc Af Amer: 76 mL/min — ABNORMAL LOW (ref >90)
GFR calc non Af Amer: 66 mL/min — ABNORMAL LOW (ref >90)
GLUCOSE: 128 mg/dL — AB (ref 70–99)
POTASSIUM: 4.2 mmol/L (ref 3.5–5.1)
Sodium: 140 mmol/L (ref 135–145)

## 2014-04-21 LAB — CBC
HEMATOCRIT: 31 % — AB (ref 39.0–52.0)
HEMOGLOBIN: 9.7 g/dL — AB (ref 13.0–17.0)
MCH: 29.2 pg (ref 26.0–34.0)
MCHC: 31.3 g/dL (ref 30.0–36.0)
MCV: 93.4 fL (ref 78.0–100.0)
Platelets: 138 10*3/uL — ABNORMAL LOW (ref 150–400)
RBC: 3.32 MIL/uL — ABNORMAL LOW (ref 4.22–5.81)
RDW: 13.5 % (ref 11.5–15.5)
WBC: 5.9 10*3/uL (ref 4.0–10.5)

## 2014-04-21 LAB — GLUCOSE, CAPILLARY
GLUCOSE-CAPILLARY: 129 mg/dL — AB (ref 70–99)
Glucose-Capillary: 111 mg/dL — ABNORMAL HIGH (ref 70–99)
Glucose-Capillary: 159 mg/dL — ABNORMAL HIGH (ref 70–99)
Glucose-Capillary: 130 mg/dL — ABNORMAL HIGH (ref 70–99)

## 2014-04-21 NOTE — Progress Notes (Signed)
Peter Weiss  Peter Weiss ZOX:[id number] DOB: June 15, 1940 DOA: 04/18/2014 PCP: Carollee Leitz, NP  Admit HPI / Brief Narrative: 74 y.o. male who presented after a syncopal episode. He was confused with his days. He stated his memory has been poor. Patient stated he was going to stand up and he passed out. He had no chest pain. He had noted dizziness when he stands up. He stated he had a recent cardiac cath which he thought was "a few days ago" but in reality he had it done in January. He was at [facility] when he had the initial cardiac event. Patient has a significant history of aortic stenosis. Patient was noted to have a low BP in the ED.   HPI/Subjective: Pt has no new complaints today.  He tells me he lives with a roommate, but that his roommate is in very poor health and battling CA.  He denies cp, sob, n/v, or abdom pain.  No HA or dizziness.      Assessment/Plan:  Syncope  Due to critical AoS + DH + BP meds   Critical AoS cardiac cath report from [facility] Jan 2016 suggests very tight valve / inability to cross w/ catheter - TTE notes valve area of ~0.6cm2 and gradient of ~64mmHg - TCTS following and planning for probable tissue AoVR  Newly diagnosed but likely subacute or chronic Afib Appears to date to at least January 2016 as is mentioned in an EKG from [facility] - agree w/ TCTS that he is not safe for anticoag given his apparent chronic cognitive impairment and high fall risk   Hypotension w/ a hx of HTN due to [facility] + BP meds in the setting of critical AoS - BP has now normalized   Acute kidney injury  Resolved w/ volume resuscitation - pre-renal due to [facility] and low CO   Modestly elevated temp Tmax 100.4 since admit - no leukocytosis - UA unrevealing - CXR w/o infiltrate - no clear indication for abx at this time - no recurrence   HLD Cont Zocor   DM CBG well controlled - follow trend  Dementia  Appears to cary a diagnosis of  dementia as he is on aricept chronically - unclear extent of prior w/u - will attempt to obtain records from his PCP   Code Status: FULL Family Communication: no family present at time of exam Disposition Plan: SDU   Consultants: TCTS  Procedures: 2/5 carotid dopplers - 40-59% R ICA stenosis - 1-39% L ICA stenosis - vertebral flow antegrade  2/5 TTE - EF 55-60% - moderate LVH - severe AS (gradient )  Antibiotics: Rocephin 2/4 > 2/5  DVT prophylaxis: SQ heparin  Objective: Blood pressure 128/98, pulse 72, temperature 97.8 F (36.6 C), temperature source Oral, resp. rate 12, height  (1.803 m), weight 117.4 kg (258 lb 13.1 oz), SpO2 98 %.  Intake/Output Summary (Last 24 hours) at 04/21/14 1306 Last data filed at 04/21/14 1200  Gross per 24 hour  Intake 1354.25 ml  Output    800 ml  Net 554.25 ml   Exam: General: No acute respiratory distress Lungs: Clear to auscultation bilaterally without wheezes or crackles Cardiovascular: irreg irreg - rate controlled - 4/6 holosystolic M Abdomen: Nontender, nondistended, soft, bowel sounds positive, no rebound, no ascites, no appreciable mass Extremities: No significant cyanosis, clubbing, or edema bilateral lower extremities  Data Reviewed: Basic Metabolic Panel:  Recent Labs Lab 04/18/14 1404 04/19/14 0750 04/20/14  0327 04/21/14 0405  NA 138 139 140 140  K 3.8 3.8 3.4* 4.2  CL 99 104 109 109  CO2 28 25 25 27   GLUCOSE 132* 127* 139* 128*  BUN 30* 34* 27* 18  CREATININE 2.49* 1.69* 1.22 1.10  CALCIUM 9.5 8.5 8.5 8.5    Liver Function Tests:  Recent Labs Lab 04/18/14 1404 04/19/14 0750  AST 25 22  ALT 16 14  ALKPHOS 72 54  BILITOT 0.6 0.7  PROT 6.9 6.0  ALBUMIN 4.1 3.3*    Recent Labs Lab 04/18/14 1522  AMMONIA 16   Coags:  Recent Labs Lab 04/19/14 0750  INR 1.20   CBC:  Recent Labs Lab 04/18/14 1404 04/19/14 0750 04/20/14 0327 04/21/14 0405  WBC 8.0 5.9 7.7 5.9  NEUTROABS 5.9  --    --   --   HGB 11.8* 10.0* 9.7* 9.7*  HCT 38.2* 31.8* 30.4* 31.0*  MCV 93.2 92.4 93.0 93.4  PLT 203 182 159 138*    Cardiac Enzymes:  Recent Labs Lab 04/18/14 2114 04/19/14 0327 04/19/14 0750  TROPONINI 0.06* 0.06* 0.05*   BNP (last 3 results)  Recent Labs  04/18/14 1404  BNP 235.4*    CBG:  Recent Labs Lab 04/20/14 1312 04/20/14 1624 04/20/14 2154 04/21/14 0729 04/21/14 1240  GLUCAP 142* 118* 175* 111* 129*    Recent Results (from the past 240 hour(s))  Blood Culture (routine x 2)     Status: None (Preliminary result)   Collection Time: 04/18/14  3:20 PM  Result Value Ref Range Status   Specimen Description BLOOD LEFT ANTECUBITAL  Final   Special Requests BOTTLES DRAWN AEROBIC AND ANAEROBIC 10MLS  Final   Culture   Final           BLOOD CULTURE RECEIVED NO GROWTH TO DATE CULTURE WILL BE HELD FOR 5 DAYS BEFORE ISSUING A FINAL NEGATIVE REPORT Performed at [facility]    Report Status PENDING  Incomplete  Blood Culture (routine x 2)     Status: None (Preliminary result)   Collection Time: 04/18/14  3:30 PM  Result Value Ref Range Status   Specimen Description BLOOD LEFT ARM  Final   Special Requests BOTTLES DRAWN AEROBIC AND ANAEROBIC 10MLS  Final   Culture   Final           BLOOD CULTURE RECEIVED NO GROWTH TO DATE CULTURE WILL BE HELD FOR 5 DAYS BEFORE ISSUING A FINAL NEGATIVE REPORT Performed at [facility]    Report Status PENDING  Incomplete  Urine culture     Status: None   Collection Time: 04/18/14  4:00 PM  Result Value Ref Range Status   Specimen Description URINE, CLEAN CATCH  Final   Special Requests NONE  Final   Colony Count   Final    6,000 COLONIES/ML Performed at [facility]    Culture   Final    INSIGNIFICANT GROWTH Performed at [facility]    Report Status 04/20/2014 FINAL  Final  MRSA PCR Screening     Status: None   Collection Time: 04/18/14  8:51 PM  Result Value Ref Range Status   MRSA by  PCR NEGATIVE NEGATIVE Final    Comment:        The GeneXpert MRSA Assay (FDA approved for NASAL specimens only), is one component of a comprehensive MRSA colonization surveillance program. It is not intended to diagnose MRSA infection nor to guide or monitor treatment for MRSA infections.  Studies:  Recent x-ray studies have been reviewed in detail by the Attending Physician  Scheduled Meds:  Scheduled Meds: . antiseptic oral rinse  7 mL Mouth Rinse BID  . aspirin EC  325 mg Oral Daily  . donepezil  10 mg Oral QHS  . folic acid  1 mg Oral Daily  . gabapentin  400 mg Oral TID  . heparin  5,000 Units Subcutaneous 3 times per day  . insulin aspart  0-15 Units Subcutaneous TID WC  . multivitamin with minerals  1 tablet Oral Daily  . pantoprazole  40 mg Oral Daily  . polyethylene glycol  17 g Oral Daily  . simvastatin  20 mg Oral Daily  . sodium chloride  3 mL Intravenous Q12H  . tamsulosin  0.4 mg Oral Daily  . thiamine  100 mg Oral Daily  . venlafaxine XR  150 mg Oral Q breakfast    Time spent on care of this patient: 35 mins   Lemarcus Baggerly T , MD   [facility] Office  [phone] Pager - Text Page per Loretha Stapler as per below:  On-Call/Text Page:      [email or web address]      password [email or web address]  If 7PM-7AM, please contact night-coverage [email or web address] Password [email or web address] 04/21/2014, 1:06 PM   LOS: 3 days

## 2014-04-21 NOTE — Progress Notes (Signed)
Patient continues to refuse wearing CPAP at this time. RT informed patient to let us know if he changes his mind.

## 2014-04-22 ENCOUNTER — Encounter (HOSPITAL_COMMUNITY): Payer: Self-pay

## 2014-04-22 ENCOUNTER — Other Ambulatory Visit: Payer: Self-pay | Admitting: *Deleted

## 2014-04-22 DIAGNOSIS — I350 Nonrheumatic aortic (valve) stenosis: Secondary | ICD-10-CM

## 2014-04-22 NOTE — Progress Notes (Signed)
[place] TEAM 1 - Stepdown/ICU TEAM Progress Note  Trisha Morandi ZOX:[id number] DOB: 03/23/1940 DOA: 04/18/2014 PCP: Carollee Leitz, NP  Admit HPI / Brief Narrative: 74 y.o. male who presented after a syncopal episode. He was confused with his days. He stated his memory has been poor. Patient stated he was going to stand up and he passed out. He had no chest pain. He had noted dizziness when he stands up. He stated he had a recent cardiac cath which he thought was "a few days ago" but in reality he had it done in January. He was at [facility] when he had the initial cardiac event. Patient has a significant history of aortic stenosis. Patient was noted to have a low BP in the ED.   His inpatient w/u has revealed critical AoS.  He is currently undergoing evaluation/preop planning for an AoVR.  He will require the extraction of multiple teeth before this procedure, for which TCTS has consulted Dental Medicine.    HPI/Subjective: Pt has no new complaints today.  He denies cp, sob, n/v, or abdom pain.  No HA or dizziness.      Assessment/Plan:  Critical AoS cardiac cath report from [facility] Jan 2016 suggests very tight valve / inability to cross w/ catheter - TTE notes valve area of ~0.6cm2 and gradient of ~42mmHg - TCTS following and planning for probable tissue AoVR  Syncope  Due to critical AoS + DH + BP meds - no recurrence since admit - begin PT/OT and follow  Multiple carious teeth  Dental Medicine to see pt  Newly diagnosed but likely subacute or chronic Afib Appears to date to at least January 2016 as is mentioned in an EKG from [facility] - agree w/ TCTS that he is not safe for anticoag given his apparent chronic cognitive impairment and high fall risk - rate controlled w/o difficulty   Hypotension w/ a hx of HTN due to [facility] + BP meds in the setting of critical AoS - BP has now normalized   Acute kidney injury  Resolved w/ volume resuscitation - pre-renal due to [facility] and low CO    Modestly elevated temp Tmax 100.4 since admit - no leukocytosis - UA unrevealing - CXR w/o infiltrate - no clear indication for abx at this time - no recurrence   HLD Cont Zocor   DM CBG well controlled - follow trend  Dementia  Appears to cary a diagnosis of dementia as he is on aricept chronically - unclear extent of prior w/u - will attempt to obtain records from his PCP   Code Status: FULL Family Communication: no family present at time of exam Disposition Plan: stable for transfer to cardiac tele bed (unit 2000 pref) - awaiting dental extraction, w/ eventual AoVR   Consultants: TCTS Dental Medicine   Procedures: 2/5 carotid dopplers - 40-59% R ICA stenosis - 1-39% L ICA stenosis - vertebral flow antegrade  2/5 TTE - EF 55-60% - moderate LVH - severe AS (gradient )  Antibiotics: Rocephin 2/4 > 2/5  DVT prophylaxis: SQ heparin  Objective: Blood pressure 136/100, pulse 93, temperature 98.6 F (37 C), temperature source Oral, resp. rate 17, height  (1.803 m), weight 118 kg (260 lb 2.3 oz), SpO2 98 %.  Intake/Output Summary (Last 24 hours) at 04/22/14 1343 Last data filed at 04/22/14 0806  Gross per 24 hour  Intake    920 ml  Output   1550 ml  Net   -630 ml  Exam: General: No acute respiratory distress Lungs: Clear to auscultation bilaterally without wheezes or crackles Cardiovascular: irreg irreg - rate controlled - 4/6 holosystolic M w/o rub  Abdomen: Nontender, nondistended, soft, bowel sounds positive, no rebound, no ascites, no appreciable mass Extremities: No significant cyanosis, clubbing, or edema bilateral lower extremities  Data Reviewed: Basic Metabolic Panel:  Recent Labs Lab 04/18/14 1404 04/19/14 0750 04/20/14 0327 04/21/14 0405  NA 138 139 140 140  K 3.8 3.8 3.4* 4.2  CL 99 104 109 109  CO2 GLUCOSE 132* 127* 139* 128*  BUN 30* 34* 27* 18  CREATININE 2.49* 1.69* 1.22 1.10  CALCIUM 9.5 8.5 8.5 8.5    Liver  Function Tests:  Recent Labs Lab 04/18/14 1404 04/19/14 0750  AST 25 22  ALT 16 14  ALKPHOS 72 54  BILITOT 0.6 0.7  PROT 6.9 6.0  ALBUMIN 4.1 3.3*    Recent Labs Lab 04/18/14 1522  AMMONIA 16   Coags:  Recent Labs Lab 04/19/14 0750  INR 1.20   CBC:  Recent Labs Lab 04/18/14 1404 04/19/14 0750 04/20/14 0327 04/21/14 0405  WBC 8.0 5.9 7.7 5.9  NEUTROABS 5.9  --   --   --   HGB 11.8* 10.0* 9.7* 9.7*  HCT 38.2* 31.8* 30.4* 31.0*  MCV 93.2 92.4 93.0 93.4  PLT 203 182 159 138*    Cardiac Enzymes:  Recent Labs Lab 04/18/14 2114 04/19/14 0327 04/19/14 0750  TROPONINI 0.06* 0.06* 0.05*   BNP (last 3 results)  Recent Labs  04/18/14 1404  BNP 235.4*    CBG:  Recent Labs Lab 04/20/14 2154 04/21/14 0729 04/21/14 1240 04/21/14 1754 04/21/14 2304  GLUCAP 175* 111* 129* 159* 130*    Recent Results (from the past 240 hour(s))  Blood Culture (routine x 2)     Status: None (Preliminary result)   Collection Time: 04/18/14  3:20 PM  Result Value Ref Range Status   Specimen Description BLOOD LEFT ANTECUBITAL  Final   Special Requests BOTTLES DRAWN AEROBIC AND ANAEROBIC  Final   Culture   Final           BLOOD CULTURE RECEIVED NO GROWTH TO DATE CULTURE WILL BE HELD FOR 5 DAYS BEFORE ISSUING A FINAL NEGATIVE REPORT Performed at [facility]    Report Status PENDING  Incomplete  Blood Culture (routine x 2)     Status: None (Preliminary result)   Collection Time: 04/18/14  3:30 PM  Result Value Ref Range Status   Specimen Description BLOOD LEFT ARM  Final   Special Requests BOTTLES DRAWN AEROBIC AND ANAEROBIC  Final   Culture   Final           BLOOD CULTURE RECEIVED NO GROWTH TO DATE CULTURE WILL BE HELD FOR 5 DAYS BEFORE ISSUING A FINAL NEGATIVE REPORT Performed at [facility]    Report Status PENDING  Incomplete  Urine culture     Status: None   Collection Time: 04/18/14  4:00 PM  Result Value Ref Range Status    Specimen Description URINE, CLEAN CATCH  Final   Special Requests NONE  Final   Colony Count   Final    6,000 COLONIES/ML Performed at [facility]    Culture   Final    INSIGNIFICANT GROWTH Performed at [facility]    Report Status 04/20/2014 FINAL  Final  MRSA PCR Screening     Status: None   Collection Time:  04/18/14  8:51 PM  Result Value Ref Range Status   MRSA by PCR NEGATIVE NEGATIVE Final    Comment:        The GeneXpert MRSA Assay (FDA approved for NASAL specimens only), is one component of a comprehensive MRSA colonization surveillance program. It is not intended to diagnose MRSA infection nor to guide or monitor treatment for MRSA infections.      Studies:  Recent x-ray studies have been reviewed in detail by the Attending Physician  Scheduled Meds:  Scheduled Meds: . antiseptic oral rinse  7 mL Mouth Rinse BID  . aspirin EC  325 mg Oral Daily  . donepezil  10 mg Oral QHS  . folic acid  1 mg Oral Daily  . gabapentin  400 mg Oral TID  . heparin  5,000 Units Subcutaneous 3 times per day  . insulin aspart  0-15 Units Subcutaneous TID WC  . multivitamin with minerals  1 tablet Oral Daily  . pantoprazole  40 mg Oral Daily  . polyethylene glycol  17 g Oral Daily  . simvastatin  20 mg Oral Daily  . sodium chloride  3 mL Intravenous Q12H  . tamsulosin  0.4 mg Oral Daily  . thiamine  100 mg Oral Daily  . venlafaxine XR  150 mg Oral Q breakfast    Time spent on care of this patient: 35 mins   Tamani Durney T , MD   [facility] Office  [phone] Pager - Text Page per Loretha StaplerAmion as per below:  On-Call/Text Page:      [email or web address]      password [email or web address]  If 7PM-7AM, please contact night-coverage [email or web address] Password [email or web address] 04/22/2014, 1:43 PM   LOS: 4 days

## 2014-04-22 NOTE — Progress Notes (Signed)
No complaints at present  He wants to get out of bed  BP 144/97 mmHg  Pulse 85  Temp(Src) 97.6 F (36.4 C) (Oral)  Resp 18  Ht 5\' 11"  (1.803 m)  Wt 260 lb 2.3 oz (118 kg)  BMI 36.30 kg/m2  SpO2 97%   Intake/Output Summary (Last 24 hours) at 04/22/14 1824 Last data filed at 04/22/14 1737  Gross per 24 hour  Intake    580 ml  Output   1300 ml  Net   -720 ml    Dr. Kristin BruinsKulinski called to evaluate for dental extractions

## 2014-04-22 NOTE — Progress Notes (Signed)
Pt placed on cpap for the night with nasal mask. tol well at this time.

## 2014-04-23 ENCOUNTER — Inpatient Hospital Stay (HOSPITAL_COMMUNITY): Payer: Medicare Other

## 2014-04-23 ENCOUNTER — Encounter (HOSPITAL_COMMUNITY): Payer: Self-pay | Admitting: Dentistry

## 2014-04-23 ENCOUNTER — Encounter (HOSPITAL_COMMUNITY): Payer: Self-pay

## 2014-04-23 DIAGNOSIS — I4891 Unspecified atrial fibrillation: Secondary | ICD-10-CM

## 2014-04-23 DIAGNOSIS — R55 Syncope and collapse: Secondary | ICD-10-CM

## 2014-04-23 DIAGNOSIS — R413 Other amnesia: Secondary | ICD-10-CM

## 2014-04-23 DIAGNOSIS — N179 Acute kidney failure, unspecified: Secondary | ICD-10-CM

## 2014-04-23 DIAGNOSIS — I350 Nonrheumatic aortic (valve) stenosis: Principal | ICD-10-CM

## 2014-04-23 DIAGNOSIS — Z01818 Encounter for other preprocedural examination: Secondary | ICD-10-CM

## 2014-04-23 LAB — GLUCOSE, CAPILLARY
GLUCOSE-CAPILLARY: 115 mg/dL — AB (ref 70–99)
GLUCOSE-CAPILLARY: 203 mg/dL — AB (ref 70–99)
Glucose-Capillary: 111 mg/dL — ABNORMAL HIGH (ref 70–99)
Glucose-Capillary: 161 mg/dL — ABNORMAL HIGH (ref 70–99)
Glucose-Capillary: 132 mg/dL — ABNORMAL HIGH (ref 70–99)
Glucose-Capillary: 109 mg/dL — ABNORMAL HIGH (ref 70–99)
Glucose-Capillary: 146 mg/dL — ABNORMAL HIGH (ref 70–99)

## 2014-04-23 LAB — PULMONARY FUNCTION TEST
DL/VA % PRED: 110 %
DL/VA: 5.15 ml/min/mmHg/L
DLCO cor % pred: 74 %
DLCO cor: 25.06 ml/min/mmHg
DLCO unc % pred: 61 %
DLCO unc: 20.75 ml/min/mmHg
FEF 25-75 PRE: 2.58 L/s
FEF 25-75 Post: 2.48 L/sec
FEF2575-%CHANGE-POST: -4 %
FEF2575-%PRED-POST: 100 %
FEF2575-%Pred-Pre: 104 %
FEV1-%Change-Post: 0 %
FEV1-%PRED-PRE: 83 %
FEV1-%Pred-Post: 83 %
FEV1-Post: 2.76 L
FEV1-Pre: 2.77 L
FEV1FVC-%Change-Post: 3 %
FEV1FVC-%Pred-Pre: 108 %
FEV6-%CHANGE-POST: -5 %
FEV6-%PRED-POST: 77 %
FEV6-%PRED-PRE: 82 %
FEV6-PRE: 3.5 L
FEV6-Post: 3.32 L
FEV6FVC-%Change-Post: 0 %
FEV6FVC-%PRED-PRE: 106 %
FEV6FVC-%Pred-Post: 106 %
FVC-%Change-Post: -3 %
FVC-%Pred-Post: 74 %
FVC-%Pred-Pre: 77 %
FVC-POST: 3.38 L
FVC-PRE: 3.5 L
POST FEV1/FVC RATIO: 81 %
Post FEV6/FVC ratio: 100 %
Pre FEV1/FVC ratio: 79 %
Pre FEV6/FVC Ratio: 100 %
RV % pred: 108 %
RV: 2.76 L
TLC % pred: 87 %
TLC: 6.33 L

## 2014-04-23 MED ORDER — ALBUTEROL SULFATE (2.5 MG/3ML) 0.083% IN NEBU
INHALATION_SOLUTION | RESPIRATORY_TRACT | Status: AC
  Filled 2014-04-23: qty 3

## 2014-04-23 MED ORDER — ALBUTEROL SULFATE (2.5 MG/3ML) 0.083% IN NEBU
2.5000 mg | INHALATION_SOLUTION | Freq: Once | RESPIRATORY_TRACT | Status: AC
  Administered 2014-04-23: 2.5 mg via RESPIRATORY_TRACT

## 2014-04-23 MED ORDER — CEFAZOLIN SODIUM-DEXTROSE 2-3 GM-% IV SOLR
2.0000 g | Freq: Once | INTRAVENOUS | Status: AC
  Administered 2014-04-24: 2 g via INTRAVENOUS
  Filled 2014-04-23: qty 50

## 2014-04-23 MED ORDER — HEPARIN SODIUM (PORCINE) 5000 UNIT/ML IJ SOLN: 5000.0000 [IU] | Freq: Three times a day (TID) | INTRAMUSCULAR | Status: DC

## 2014-04-23 NOTE — Progress Notes (Signed)
PT Cancellation Note  Patient Details Name: Peter Weiss MRN: [id number] DOB: 1940-06-01   Cancelled Treatment:     PT evaluation not completed. Pt was unavailable due to testing or procedure. Will attempt again at a later time today or tomorrow.    [name], Beuford Garcilazo Kerstine 04/23/2014, 8:21 AM

## 2014-04-23 NOTE — Progress Notes (Signed)
Progress Note  Peter Weiss ZOX:[id number] DOB: 09/27/1940 DOA: 04/18/2014 PCP: Carollee Leitz, NP  Admit HPI / Brief Narrative: 75 y.o. male who presented after a syncopal episode. He was confused with his days. He stated his memory has been poor. Patient stated he was going to stand up and he passed out. He had no chest pain. He had noted dizziness when he stands up. He stated he had a recent cardiac cath which he thought was "a few days ago" but in reality he had it done in January. He was at [facility] when he had the initial cardiac event. Patient has a significant history of aortic stenosis. Patient was noted to have a low BP in the ED.   His inpatient w/u has revealed critical AoS.  He is currently undergoing evaluation/preop planning for an AoVR.  Patient was seen by dental medicine for extraction of multiple teeth before this procedure.  HPI/Subjective: Pt has no new complaints today.  He denies cp, sob, n/v, or abdom pain.  No HA or dizziness.      Assessment/Plan:  Critical AoS cardiac cath report from [facility] Jan 2016 suggests very tight valve / inability to cross w/ catheter - TTE notes valve area of ~0.6cm2 and gradient of ~76mmHg - TCTS following and planning for probable tissue AoVR  Syncope  Due to critical AoS + DH + BP meds - no recurrence since admit - begin PT/OT and follow  Multiple carious teeth  Seen by dental medicine  Newly diagnosed but likely subacute or chronic Afib Appears to date to at least January 2016 as is mentioned in an EKG from [facility] - agree w/ TCTS that he is not safe for anticoag given his apparent chronic cognitive impairment and high fall risk - rate controlled w/o difficulty  - Will consult cardiology  Hypotension w/ a hx of HTN due to [facility] + BP meds in the setting of critical AoS - BP has now normalized   Acute kidney injury  Resolved w/ volume resuscitation - pre-renal due to [facility] and low CO   Modestly elevated temp Tmax 100.4  since admit - no leukocytosis - UA unrevealing - CXR w/o infiltrate - no clear indication for abx at this time - no recurrence   HLD Cont Zocor   DM CBG well controlled - follow trend  Dementia  Appears to cary a diagnosis of dementia as he is on aricept chronically - unclear extent of prior w/u - will attempt to obtain records from his PCP   Code Status: FULL Family Communication: no family present at time of exam Disposition Plan: Continue on telemetry floor - awaiting dental extraction, w/ eventual AoVR   Consultants: TCTS Dental Medicine   Procedures: 2/5 carotid dopplers - 40-59% R ICA stenosis - 1-39% L ICA stenosis - vertebral flow antegrade  2/5 TTE - EF 55-60% - moderate LVH - severe AS (gradient )  Antibiotics: Rocephin 2/4 > 2/5  DVT prophylaxis: SQ heparin  Objective: Blood pressure 121/78, pulse 93, temperature 97.3 F (36.3 C), temperature source Oral, resp. rate 19, height  (1.803 m), weight 118 kg (260 lb 2.3 oz), SpO2 97 %.  Intake/Output Summary (Last 24 hours) at 04/23/14 1339 Last data filed at 04/23/14 1311  Gross per 24 hour  Intake    995 ml  Output   1800 ml  Net   -805 ml   Exam: General: No acute respiratory distress Lungs: Clear to auscultation bilaterally without wheezes or  crackles Cardiovascular: irreg irreg - rate controlled - 4/6 holosystolic M w/o rub  Abdomen: Nontender, nondistended, soft, bowel sounds positive, no rebound, no ascites, no appreciable mass Extremities: No significant cyanosis, clubbing, or edema bilateral lower extremities  Data Reviewed: Basic Metabolic Panel:  Recent Labs Lab 04/18/14 1404 04/19/14 0750 04/20/14 0327 04/21/14 0405  NA 138 139 140 140  K 3.8 3.8 3.4* 4.2  CL 99 104 109 109  CO2 28 25 25 27   GLUCOSE 132* 127* 139* 128*  BUN 30* 34* 27* 18  CREATININE 2.49* 1.69* 1.22 1.10  CALCIUM 9.5 8.5 8.5 8.5    Liver Function Tests:  Recent Labs Lab 04/18/14 1404 04/19/14 0750    AST 25 22  ALT 16 14  ALKPHOS 72 54  BILITOT 0.6 0.7  PROT 6.9 6.0  ALBUMIN 4.1 3.3*    Recent Labs Lab 04/18/14 1522  AMMONIA 16   Coags:  Recent Labs Lab 04/19/14 0750  INR 1.20   CBC:  Recent Labs Lab 04/18/14 1404 04/19/14 0750 04/20/14 0327 04/21/14 0405  WBC 8.0 5.9 7.7 5.9  NEUTROABS 5.9  --   --   --   HGB 11.8* 10.0* 9.7* 9.7*  HCT 38.2* 31.8* 30.4* 31.0*  MCV 93.2 92.4 93.0 93.4  PLT 203 182 159 138*    Cardiac Enzymes:  Recent Labs Lab 04/18/14 2114 04/19/14 0327 04/19/14 0750  TROPONINI 0.06* 0.06* 0.05*   BNP (last 3 results)  Recent Labs  04/18/14 1404  BNP 235.4*    CBG:  Recent Labs Lab 04/21/14 0729 04/21/14 1240 04/21/14 1754 04/21/14 2304 04/23/14 1129  GLUCAP 111* 129* 159* 130* 203*    Recent Results (from the past 240 hour(s))  Blood Culture (routine x 2)     Status: None (Preliminary result)   Collection Time: 04/18/14  3:20 PM  Result Value Ref Range Status   Specimen Description BLOOD LEFT ANTECUBITAL  Final   Special Requests BOTTLES DRAWN AEROBIC AND ANAEROBIC 10MLS  Final   Culture   Final           BLOOD CULTURE RECEIVED NO GROWTH TO DATE CULTURE WILL BE HELD FOR 5 DAYS BEFORE ISSUING A FINAL NEGATIVE REPORT Performed at [facility]    Report Status PENDING  Incomplete  Blood Culture (routine x 2)     Status: None (Preliminary result)   Collection Time: 04/18/14  3:30 PM  Result Value Ref Range Status   Specimen Description BLOOD LEFT ARM  Final   Special Requests BOTTLES DRAWN AEROBIC AND ANAEROBIC 10MLS  Final   Culture   Final           BLOOD CULTURE RECEIVED NO GROWTH TO DATE CULTURE WILL BE HELD FOR 5 DAYS BEFORE ISSUING A FINAL NEGATIVE REPORT Performed at [facility]    Report Status PENDING  Incomplete  Urine culture     Status: None   Collection Time: 04/18/14  4:00 PM  Result Value Ref Range Status   Specimen Description URINE, CLEAN CATCH  Final   Special Requests NONE   Final   Colony Count   Final    6,000 COLONIES/ML Performed at [facility]    Culture   Final    INSIGNIFICANT GROWTH Performed at [facility]    Report Status 04/20/2014 FINAL  Final  MRSA PCR Screening     Status: None   Collection Time: 04/18/14  8:51 PM  Result Value Ref Range Status   MRSA  by PCR NEGATIVE NEGATIVE Final    Comment:        The GeneXpert MRSA Assay (FDA approved for NASAL specimens only), is one component of a comprehensive MRSA colonization surveillance program. It is not intended to diagnose MRSA infection nor to guide or monitor treatment for MRSA infections.      Studies:  Recent x-ray studies have been reviewed in detail by the Attending Physician  Scheduled Meds:  Scheduled Meds: . albuterol      . antiseptic oral rinse  7 mL Mouth Rinse BID  . aspirin EC  325 mg Oral Daily  . donepezil  10 mg Oral QHS  . folic acid  1 mg Oral Daily  . gabapentin  400 mg Oral TID  . heparin  5,000 Units Subcutaneous 3 times per day  . insulin aspart  0-15 Units Subcutaneous TID WC  . multivitamin with minerals  1 tablet Oral Daily  . pantoprazole  40 mg Oral Daily  . polyethylene glycol  17 g Oral Daily  . simvastatin  20 mg Oral Daily  . sodium chloride  3 mL Intravenous Q12H  . tamsulosin  0.4 mg Oral Daily  . thiamine  100 mg Oral Daily  . venlafaxine XR  150 mg Oral Q breakfast    Time spent on care of this patient: 30 mins   ELGERGAWY, DAWOOD , MD  [phone]  [facility] Pager - Text Page per Loretha Stapler as per below:   If 7PM-7AM, please contact night-coverage [email or web address] Password [email or web address] 04/23/2014, 1:39 PM   LOS: 5 days

## 2014-04-23 NOTE — Progress Notes (Signed)
Medicare Important Message given? YES  (If response is "NO", the following Medicare IM given date fields will be blank)  Date Medicare IM given: 04/23/14 Medicare IM given by:  Hajime Asfaw  

## 2014-04-23 NOTE — Progress Notes (Signed)
Placed patient on CPAP with nasal mask.   Patient is tolerating well at this time.

## 2014-04-23 NOTE — Evaluation (Signed)
Occupational Therapy Evaluation Patient Details Name: Peter Weiss MRN: [id number] DOB: 07-Mar-1941 Today's Date: 04/23/2014    History of Present Illness Peter Weiss is a 74 y.o. male presents with a syncopal episode. He is a little confused with his days. He is not sure if he passed out today or yesterday but friends noted he had a syncopal episode today. He states that his memory has been poor. Patient states that he was going to stand up and he passed out. He had no chest pain. He states that he has noted dizziness when he stands up. He states that he had a recent cardiac cath which he thinks was a few days ago but in reality he had it done in January. He has noted some edema. He states he was at [facility] when he had the initial cardiac event. Patient also has a significant history of aortic stenosis. Patient is on multiple medications for his blood pressure and has been noted to have a low BP in the ED.    Clinical Impression   Pt was independent in self care and ambulated with a cane prior to admission.  He has a hx of dementia. Pt frustrated with decline in his overall general health and mentation.  Required supervision for ADL and ADL transfers assessed today.  Could benefit from DME for showering and toileting, but not initially agreeable to these recommendations. Will follow.    Follow Up Recommendations  Home health OT    Equipment Recommendations  3 in 1 bedside comode (if pt is agreeable)    Recommendations for Other Services       Precautions / Restrictions Precautions Precautions: Fall Restrictions Weight Bearing Restrictions: No      Mobility Bed Mobility             General bed mobility comments: pt received in chair  Transfers Overall transfer level: Needs assistance Equipment used: Straight cane Transfers: Sit to/from Stand Sit to Stand: Supervision         General transfer comment: wide BOS, use of UE to assist with rise up, increased time  and unsteady although was able to complete transfer without therapist assisting.    Balance Overall balance assessment: Needs assistance Sitting-balance support: Bilateral upper extremity supported Sitting balance-Leahy Scale: Good     Standing balance support: During functional activity Standing balance-Leahy Scale: Fair                              ADL Overall ADL's : Needs assistance/impaired Eating/Feeding: Independent;Sitting   Grooming: Min guard;Standing;Wash/dry hands   Upper Body Bathing: Set up;Sitting   Lower Body Bathing: Min guard;Sit to/from stand   Upper Body Dressing : Set up;Sitting   Lower Body Dressing: Sit to/from stand;Supervision/safety   Toilet Transfer: Ambulation;Regular Toilet;Grab bars;Supervision/safety   Toileting- Clothing Manipulation and Hygiene: Sit to/from stand;Supervision/safety       Functional mobility during ADLs: Cane;Rolling walker;Supervision/safety General ADL Comments: Able to cross foot over opposite knee to donn and doff socks.     Vision                     Perception     Praxis      Pertinent Vitals/Pain Pain Assessment: Faces Faces Pain Scale: Hurts a little bit Pain Location: L knee--chronic Pain Intervention(s): Repositioned;Monitored during session;Limited activity within patient's tolerance     Hand Dominance Right   Extremity/Trunk Assessment Upper Extremity Assessment  Upper Extremity Assessment: Overall WFL for tasks assessed   Lower Extremity Assessment Lower Extremity Assessment: Defer to PT evaluation       Communication Communication Communication: No difficulties   Cognition Arousal/Alertness: Awake/alert Behavior During Therapy: WFL for tasks assessed/performed Overall Cognitive Status: No family/caregiver present to determine baseline cognitive functioning (hx of dementia)       Memory: Decreased short-term memory             General Comments        Exercises       Shoulder Instructions      Home Living Family/patient expects to be discharged to:: Private residence Living Arrangements: Non-relatives/Friends Available Help at Discharge: Friend(s) Type of Home: Mobile home Home Access: Stairs to enter Entrance Stairs-Number of Steps: 5 Entrance Stairs-Rails: Can reach both;Right;Left Home Layout: One level     Bathroom Shower/Tub: Producer, television/film/videoWalk-in shower   Bathroom Toilet: Standard     Home Equipment: Environmental consultantWalker - 2 wheels;Cane - single point          Prior Functioning/Environment Level of Independence: Independent with assistive device(s)             OT Diagnosis: Generalized weakness;Cognitive deficits   OT Problem List: Decreased strength;Decreased activity tolerance;Impaired balance (sitting and/or standing);Decreased cognition;Decreased coordination;Decreased safety awareness;Decreased knowledge of use of DME or AE;Obesity;Pain   OT Treatment/Interventions: Self-care/ADL training;DME and/or AE instruction;Therapeutic activities;Patient/family education;Balance training    OT Goals(Current goals can be found in the care plan section) Acute Rehab OT Goals Patient Stated Goal: none stated OT Goal Formulation: With patient Time For Goal Achievement: 05/07/14 Potential to Achieve Goals: Good ADL Goals Pt Will Perform Grooming: with modified independence;standing Pt Will Perform Lower Body Bathing: with modified independence;sit to/from stand Pt Will Perform Lower Body Dressing: with modified independence;sit to/from stand Pt Will Transfer to Toilet: with modified independence;ambulating;bedside commode (over toilet) Pt Will Perform Toileting - Clothing Manipulation and hygiene: with modified independence;sit to/from stand Pt Will Perform Tub/Shower Transfer: Shower transfer;ambulating;shower seat;rolling walker Additional ADL Goal #1: Pt will be knowledgeable in use of DME to enhance safety with showering.  OT Frequency:  Min 2X/week   Barriers to D/C:            Co-evaluation              End of Session Equipment Utilized During Treatment: Gait belt;Rolling walker  Activity Tolerance: Patient tolerated treatment well Patient left: Other (comment) (in w/c for test)   Time: 1155-1222 OT Time Calculation (min): 27 min Charges:  OT General Charges $OT Visit: 1 Procedure OT Evaluation $Initial OT Evaluation Tier I: 1 Procedure OT Treatments $Self Care/Home Management : 8-22 mins G-Codes:    Evern BioMayberry, Naveen Lorusso Lynn 04/23/2014, 1:09 PM

## 2014-04-23 NOTE — Evaluation (Signed)
Physical Therapy Evaluation Patient Details Name: Peter Weiss MRN: [id number] DOB: 1941-02-04 Today's Date: 04/23/2014   History of Present Illness  Peter Weiss is a 74 y.o. male presents with a syncopal episode. He is a little confused with his days. He is not sure if he passed out today or yesterday but friends noted he had a syncopal episode today. He states that his memory has been poor. Patient states that he was going to stand up and he passed out. He had no chest pain. He states that he has noted dizziness when he stands up. He states that he had a recent cardiac cath which he thinks was a few days ago but in reality he had it done in January. He has noted some edema. He states he was at [facility] when he had the initial cardiac event. Patient also has a significant history of aortic stenosis. Patient is on multiple medications for his blood pressure and has been noted to have a low BP in the ED.   Clinical Impression  Pt presents with dependencies in mobility and balance. Pt was not oriented at times and became agitated one minute and then joking the next. Pt does have a history of dementia. Pt was a little resistant to my therapy recommendations to improve his balance and gait. Pt did agree to use a RW for improved safety and balance. Pt reports he lives with roommates who can assist as needed. I would recommend 24 hr Supervision at this time due to decreased balance without a RW and a history of dementia. Orthostats checked prior to walking. Pt would benefit from continued skilled PT until d/c to improve safety and independence.     Follow Up Recommendations Supervision/Assistance - 24 hour;No PT follow up    Equipment Recommendations  None recommended by PT    Recommendations for Other Services       Precautions / Restrictions Precautions Precautions: Fall Restrictions Weight Bearing Restrictions: No      Mobility  Bed Mobility Overal bed mobility: Modified  Independent             General bed mobility comments: use of bed rails and increased time  Transfers Overall transfer level: Needs assistance Equipment used: Straight cane Transfers: Sit to/from Stand Sit to Stand: Supervision         General transfer comment: wide BOS, use of UE to assist with rise up, increased time and unsteady although was able to complete transfer without therapist assisting.  Ambulation/Gait Ambulation/Gait assistance: Min guard Ambulation Distance (Feet): 110 Feet Assistive device: Rolling walker (2 wheeled) Gait Pattern/deviations: Step-through pattern;Decreased stride length;Trunk flexed Gait velocity: decreased   General Gait Details: Pt was min assist with cane and c/o knee pain. Pt then ambulated with RW for improved safety and balance. I tried to cue him on increasing his step length and posture, but the pt was resistant to listening.  Stairs            Wheelchair Mobility    Modified Rankin (Stroke Patients Only)       Balance Overall balance assessment: Needs assistance Sitting-balance support: Bilateral upper extremity supported Sitting balance-Leahy Scale: Good     Standing balance support: During functional activity Standing balance-Leahy Scale: Fair                               Pertinent Vitals/Pain Pain Assessment: Faces Faces Pain Scale: Hurts a little bit  Pain Location: left knee Pain Intervention(s): Limited activity within patient's tolerance    Home Living Family/patient expects to be discharged to:: Private residence Living Arrangements: Non-relatives/Friends Available Help at Discharge: Friend(s) Type of Home: Mobile home Home Access: Stairs to enter Entrance Stairs-Rails: Can reach both;Right;Left Entrance Stairs-Number of Steps: 5 Home Layout: One level Home Equipment: Walker - 2 wheels;Cane - single point      Prior Function Level of Independence: Independent with assistive device(s)                Hand Dominance        Extremity/Trunk Assessment   Upper Extremity Assessment: Defer to OT evaluation           Lower Extremity Assessment: Overall WFL for tasks assessed         Communication   Communication: No difficulties  Cognition Arousal/Alertness: Awake/alert Behavior During Therapy: Agitated Overall Cognitive Status: No family/caregiver present to determine baseline cognitive functioning (pt joked at times and then became agitated)                      General Comments      Exercises        Assessment/Plan    PT Assessment Patient needs continued PT services  PT Diagnosis Difficulty walking   PT Problem List Decreased activity tolerance;Decreased strength;Decreased mobility;Decreased knowledge of use of DME;Decreased safety awareness;Decreased knowledge of precautions;Decreased cognition;Decreased balance  PT Treatment Interventions DME instruction;Gait training;Functional mobility training;Therapeutic activities;Therapeutic exercise;Balance training;Cognitive remediation;Patient/family education   PT Goals (Current goals can be found in the Care Plan section) Acute Rehab PT Goals Patient Stated Goal: none stated PT Goal Formulation: With patient Time For Goal Achievement: 05/07/14 Potential to Achieve Goals: Good    Frequency Min 3X/week   Barriers to discharge        Co-evaluation               End of Session Equipment Utilized During Treatment: Gait belt Activity Tolerance: Patient tolerated treatment well Patient left: in chair;with chair alarm set Nurse Communication: Mobility status         Time: 1136-1201 PT Time Calculation (min) (ACUTE ONLY): 25 min   Charges:   PT Evaluation $Initial PT Evaluation Tier I: 1 Procedure PT Treatments $Gait Training: 8-22 mins   PT G Codes:        Greggory Stallion 04/23/2014, 12:12 PM

## 2014-04-23 NOTE — Progress Notes (Signed)
1000 We were ordered pre op . Will let PT evaluate and we will continue to follow. Luetta Nuttingharlene Ayerim Berquist RN BSN 04/23/2014 9:59 AM

## 2014-04-23 NOTE — Consult Note (Signed)
DENTAL CONSULTATION  Date of Consultation:  04/23/2014 Patient Name:   Peter Weiss Date of Birth:   23-Dec-1940 Medical Record Number: [id number]  VITALS: BP 147/92 mmHg  Pulse 99  Temp(Src) 99.8 F (37.7 C) (Oral)  Resp 18  Ht 5\' 11"  (1.803 m)  Wt 260 lb 2.3 oz (118 kg)  BMI 36.30 kg/m2  SpO2 98%  CHIEF COMPLAINT: Patient referred by Dr. Dorris FetchHendrickson for a pre-heart valve surgery dental protocol examination.   HPI: Peter Weiss is a 74 year old male recently diagnosed with critical aortic stenosis. Patient with anticipated aortic valve replacement with Dr. Dorris FetchHendrickson. Patient is now seen as part of a medically necessary pre-heart valve dental protocol examination to rule out dental infection that may affect the patient's systemic health and anticipated heart valve surgery.  The patient currently denies acute toothaches, swellings, or abscesses. The patient indicates that  he has not seen a dentist for at least 30 years. The patient denies having any partial dentures.  PROBLEM LIST: Patient Active Problem List   Diagnosis Date Noted  . Aortic valve stenosis, critical 04/20/2014    Priority: High  . Syncope and collapse 04/18/2014  . Dehydration 04/18/2014  . Hypotension 04/18/2014  . Syncope 04/18/2014  . Arterial hypotension 03/25/2014  . Lethargic 03/25/2014  . Adjustment disorder with mixed anxiety and depressed mood 03/25/2014  . Chronic back pain 09/28/2013  . Mild cognitive impairment 09/28/2013  . DM2 (diabetes mellitus, type 2) 09/28/2013  . HLD (hyperlipidemia) 09/28/2013  . HTN (hypertension) 09/28/2013  . GERD (gastroesophageal reflux disease) 09/28/2013    PMH: Past Medical History  Diagnosis Date  . Arthritis   . Chicken pox   . Heart murmur   . Hypertension   . Hyperlipidemia   . Urinary incontinence   . History of fainting spells of unknown cause   . Diabetes mellitus without complication   . Depression     PSH: Past Surgical  History  Procedure Laterality Date  . Goiter  2008    ALLERGIES: No Known Allergies  MEDICATIONS: Current Facility-Administered Medications  Medication Dose Route Frequency Provider Last Rate Last Dose  . acetaminophen (TYLENOL) tablet 650 mg  650 mg Oral Q6H PRN Yevonne PaxSaadat A Khan, MD       Or  . acetaminophen (TYLENOL) suppository 650 mg  650 mg Rectal Q6H PRN Yevonne PaxSaadat A Khan, MD      . antiseptic oral rinse (CPC / CETYLPYRIDINIUM CHLORIDE 0.05%) solution 7 mL  7 mL Mouth Rinse BID Lonia BloodJeffrey T McClung, MD   7 mL at 04/22/14 2138  . aspirin EC tablet 325 mg  325 mg Oral Daily Yevonne PaxSaadat A Khan, MD   325 mg at 04/22/14 0943  . donepezil (ARICEPT) tablet 10 mg  10 mg Oral QHS Yevonne PaxSaadat A Khan, MD   10 mg at 04/22/14 2133  . folic acid (FOLVITE) tablet 1 mg  1 mg Oral Daily Yevonne PaxSaadat A Khan, MD   1 mg at 04/22/14 0943  . gabapentin (NEURONTIN) capsule 400 mg  400 mg Oral TID Yevonne PaxSaadat A Khan, MD   400 mg at 04/22/14 2133  . heparin injection 5,000 Units  5,000 Units Subcutaneous 3 times per day Yevonne PaxSaadat A Khan, MD   5,000 Units at 04/22/14 2134  . insulin aspart (novoLOG) injection 0-15 Units  0-15 Units Subcutaneous TID WC Lonia BloodJeffrey T McClung, MD   3 Units at 04/22/14 1307  . multivitamin with minerals tablet 1 tablet  1 tablet Oral Daily Yevonne PaxSaadat A Khan,  MD   1 tablet at 04/22/14 0943  . ondansetron (ZOFRAN) tablet 4 mg  4 mg Oral Q6H PRN Yevonne Pax, MD       Or  . ondansetron [facility]) injection 4 mg  4 mg Intravenous Q6H PRN Yevonne Pax, MD      . oxyCODONE (Oxy IR/ROXICODONE) immediate release tablet 10-15 mg  10-15 mg Oral Q4H PRN Lonia Blood, MD   10 mg at 04/22/14 2139  . pantoprazole (PROTONIX) EC tablet 40 mg  40 mg Oral Daily Yevonne Pax, MD   40 mg at 04/22/14 0944  . polyethylene glycol (MIRALAX / GLYCOLAX) packet 17 g  17 g Oral Daily Rolan Lipa, NP   17 g at 04/22/14 0943  . simvastatin (ZOCOR) tablet 20 mg  20 mg Oral Daily Yevonne Pax, MD   20 mg at 04/22/14 0943  . sodium  chloride 0.9 % injection 3 mL  3 mL Intravenous Q12H Yevonne Pax, MD   3 mL at 04/22/14 2138  . tamsulosin (FLOMAX) capsule 0.4 mg  0.4 mg Oral Daily Lonia Blood, MD   0.4 mg at 04/22/14 0943  . thiamine (VITAMIN B-1) tablet 100 mg  100 mg Oral Daily Yevonne Pax, MD   100 mg at 04/22/14 0943  . venlafaxine XR (EFFEXOR-XR) 24 hr capsule 150 mg  150 mg Oral Q breakfast Yevonne Pax, MD   150 mg at 04/22/14 1610    LABS: Lab Results  Component Value Date   WBC 5.9 04/21/2014   HGB 9.7* 04/21/2014   HCT 31.0* 04/21/2014   MCV 93.4 04/21/2014   PLT 138* 04/21/2014      Component Value Date/Time   NA 140 04/21/2014 0405   K 4.2 04/21/2014 0405   CL 109 04/21/2014 0405   CO2 27 04/21/2014 0405   GLUCOSE 128* 04/21/2014 0405   BUN 18 04/21/2014 0405   CREATININE 1.10 04/21/2014 0405   CALCIUM 8.5 04/21/2014 0405   GFRNONAA 66* 04/21/2014 0405   GFRAA 76* 04/21/2014 0405   Lab Results  Component Value Date   INR 1.20 04/19/2014   No results found for: PTT  SOCIAL HISTORY: History   Social History  . Marital Status: Married    Spouse Name: N/A    Number of Children: N/A  . Years of Education: N/A   Occupational History  . Not on file.   Social History Main Topics  . Smoking status: Never Smoker   . Smokeless tobacco: Never Used  . Alcohol Use: No  . Drug Use: No  . Sexual Activity: Not Currently   Other Topics Concern  . Not on file   Social History Narrative   ** Merged History Encounter **        FAMILY HISTORY: Family History  Problem Relation Age of Onset  . Alcohol abuse Mother   . Heart disease Mother   . Diabetes Mother   . Heart disease Father   . Diabetes Father   . Cancer Mother   . Cancer Father   . Alcohol abuse Father   . Alcohol abuse Brother     REVIEW OF SYSTEMS: Reviewed from chart for this admission.   DENTAL HISTORY: CHIEF COMPLAINT: Patient referred by Dr. Dorris Fetch for a pre-heart valve surgery dental protocol  examination.   HPI: Peter Weiss is a 74 year old male recently diagnosed with critical aortic stenosis. Patient with anticipated aortic valve replacement with Dr. Dorris Fetch. Patient is  now seen as part of a medically necessary pre-heart valve dental protocol examination to rule out dental infection that may affect the patient's systemic health and anticipated heart valve surgery.  The patient currently denies acute toothaches, swellings, or abscesses. The patient indicates that  he has not seen a dentist for at least 30 years. The patient denies having any partial dentures.  DENTAL EXAMINATION: GENERAL: Patient is a well-nourished, well-developed male in no acute distress. HEAD AND NECK: There is no submandibular lymphadenopathy. Patient denies acute TMJ symptoms. INTRAORAL EXAM: The patient has normal saliva. I do not see any evidence of intraoral abscess formation. DENTITION: The patient has multiple missing teeth. The patient has multiple retained root segments. PERIODONTAL: Patient has chronic periodontal disease with plaque and calculus accumulations, gingival recession, and incipient tooth mobility. DENTAL CARIES/SUBOPTIMAL RESTORATIONS: Multiple dental caries are noted. ENDODONTIC: The patient denies acute pulpitis symptoms. The patient does have periapical radiolucency associated with retained roots numbers 12, 14, and 15. CROWN AND BRIDGE: Patient has multiple crown qand bridge restorations. The patient has a bridge involving abutment tooth #31 with pontics 30 and 29. This will benefit from sectioning at the distal abutment. PROSTHODONTIC: Patient denies having any partial dentures. OCCLUSION: Patient has a poor occlusal scheme secondary to multiple missing teeth, multiple retained root segments, and lack of replacement of the missing teeth with clinically acceptable dental prostheses  RADIOGRAPHIC INTERPRETATION: Orthopantogram was taken on 04/22/14. The orthopantogram a  suboptimal secondary to lack of patient cooperation and positioning problems. There are multiple missing teeth. There are multiple retained root segments. Dental caries are noted. There is incipient to moderate bone loss. Multiple crown and bridge restorations are noted.   ASSESSMENTS: 1. Aortic stenosis with anticipated aortic valve replacement 2. Pre-heart valve surgery dental protocol examination 3. Chronic periodontitis of bone loss 4. Accretions 5. Multiple retained root segments 6. Dental caries and suboptimal dental restorations 7. Multiple missing teeth 8. Poor occlusal scheme and malocclusion 9. Risk for bleeding with invasive dental procedures 10. Potential for cardiovascular complications up to and including death with anticipated invasive dental procedures. 11. Chronic apical periodontitis  PLAN/RECOMMENDATIONS: 1. I discussed the risks, benefits, and complications of various treatment options with the patient in relationship to the medical and dental conditions, anticipated heart valve surgery, and risk for endocarditis. We discussed various treatment options to include no treatment, multiple extractions with alveoloplasty, pre-prosthetic surgery as indicated, periodontal therapy, dental restorations, root canal therapy, crown and bridge therapy, implant therapy, and replacement of missing teeth as indicated. The patient currently wishes to proceed with extraction of indicated teeth with alveoloplasty, gross debridement of remaining dentition, and sectioning of the bridge at the distal abutment in the operating room with general anesthesia.  The patient will then follow-up with a dentist of his choice for future dental care as indicated once he is medically stable. Patient should be acceptable to proceed with aortic valve replacement on this Friday barring any complications from that dental procedures.  2. Discussion of findings with medical team and coordination of future medical  and dental ca in the operating or general anesthesia. re as needed.  Charlynne Pander, DDS

## 2014-04-23 NOTE — Progress Notes (Addendum)
VASCULAR LAB PRELIMINARY  PRELIMINARY  PRELIMINARY  PRELIMINARY  Pre-op Cardiac Surgery  Carotid Findings:  Bilateral carotid artery duplex completed 04-19-14.  Results are 40-59% ICA stenosis on the right and 1-39% on the left.  Upper Extremity Right Left  Brachial Pressures 131 triphasic 134 triphasic  Radial Waveforms triphasic triphasic  Ulnar Waveforms triphasic triphasic  Palmar Arch (Allen's Test) WNL WNL   Findings:  Doppler waveforms remain normal with ulnar and radial compressions bilaterally.    Artesia Berkey, RVT 04/23/2014, 12:59 PM

## 2014-04-23 NOTE — Progress Notes (Signed)
OT Cancellation Note  Patient Details Name: Peter Weiss MRN: [id number] DOB: Jun 21, 1940   Cancelled Treatment:    Reason Eval/Treat Not Completed: Patient at procedure or test/ unavailable (Will continue to follow.)  Evern BioMayberry, Mehak Roskelley Lynn 04/23/2014, 9:46 AM

## 2014-04-23 NOTE — Consult Note (Signed)
Admit date: 04/18/2014 Referring Physician: Dr. Randol Kern Primary Physician Doss, Oleh Genin, NP Primary Cardiologist  Dr. Julien Nordmann Reason for Consultation  atrial fibrillation, critical aortic stenosis  HPI: 74 year old male with severe aortic stenosis, valve area 0.6 cm with a gradient of 60 mmHg currently being followed by cardiothoracic surgery for possible aortic valve replacement who had syncope likely secondary to critical aortic stenosis, dehydration as well as blood pressure medication. Since adjusting his medications he has not had any recurrent syncope. Atrial fibrillation has also been discovered but apparently this dates back to at least January 2016 as mentioned in EKG from [facility]. It was felt as though he was not safe for anticoagulation given his chronic cognitive impairment and high fall risk. Currently rate controlled.  He also had a temperature of 100.4 with no clear etiology.  Dr. Andrey Spearman has seen patient during this admission on 04/22/14. Dental consult activated.  He's going to undergo dental procedure tomorrow. He states that possible surgery time would be Friday for aortic valve replacement.  Currently he is not having any chest pain, no shortness of breath, no palpitations. Atrial fibrillation is under good control from a rate perspective.    PMH:   Past Medical History  Diagnosis Date  . Arthritis   . Chicken pox   . Heart murmur   . Hypertension   . Hyperlipidemia   . Urinary incontinence   . History of fainting spells of unknown cause   . Diabetes mellitus without complication   . Depression     PSH:   Past Surgical History  Procedure Laterality Date  . Goiter  2008   Allergies:  Review of patient's allergies indicates no known allergies. Prior to Admit Meds:   Prior to Admission medications   Medication Sig Start Date End Date Taking? Authorizing Provider  donepezil (ARICEPT) 10 MG tablet Take 10 mg by mouth at bedtime.    Yes Historical Provider, MD  furosemide (LASIX) 20 MG tablet Take 20 mg by mouth daily.   Yes Historical Provider, MD  gabapentin (NEURONTIN) 400 MG capsule Take 400 mg by mouth 3 (three) times daily.   Yes Historical Provider, MD  losartan (COZAAR) 50 MG tablet Take 50 mg by mouth daily.   Yes Historical Provider, MD  metoprolol succinate (TOPROL-XL) 50 MG 24 hr tablet Take 50 mg by mouth daily. Take with or immediately following a meal.   Yes Historical Provider, MD  Multiple Vitamin (MULTIVITAMIN) capsule Take 1 capsule by mouth daily.   Yes Historical Provider, MD  omeprazole (PRILOSEC) 20 MG capsule Take 20 mg by mouth daily.   Yes Historical Provider, MD  oxycodone (ROXICODONE) 30 MG immediate release tablet Take 30 mg by mouth every 6 (six) hours as needed for pain.   Yes Historical Provider, MD  simvastatin (ZOCOR) 20 MG tablet Take 20 mg by mouth daily.   Yes Historical Provider, MD  tamsulosin (FLOMAX) 0.4 MG CAPS capsule Take 0.4 mg by mouth daily.   Yes Historical Provider, MD  venlafaxine XR (EFFEXOR-XR) 150 MG 24 hr capsule Take 150 mg by mouth daily with breakfast.   Yes Historical Provider, MD  Oxycodone HCl 10 MG TABS Take 1 tablet (10 mg total) by mouth 3 (three) times daily. Patient not taking: Reported on 04/19/2014 09/28/13   Lorre Munroe, NP   Fam HX:    Family History  Problem Relation Age of Onset  . Alcohol abuse Mother   . Heart disease Mother   .  Diabetes Mother   . Heart disease Father   . Diabetes Father   . Cancer Mother   . Cancer Father   . Alcohol abuse Father   . Alcohol abuse Brother    Social HX:    History   Social History  . Marital Status: Married    Spouse Name: N/A    Number of Children: N/A  . Years of Education: N/A   Occupational History  . Not on file.   Social History Main Topics  . Smoking status: Never Smoker   . Smokeless tobacco: Never Used  . Alcohol Use: No  . Drug Use: No  . Sexual Activity: Not Currently   Other  Topics Concern  . Not on file   Social History Narrative   ** Merged History Encounter **         ROS:  Positive for syncope. Difficulty with memory. No orthopnea. All 11 ROS were addressed and are negative except what is stated in the HPI   Physical Exam: Blood pressure 121/78, pulse 93, temperature 97.3 F (36.3 C), temperature source Oral, resp. rate 19, height  (1.803 m), weight 260 lb 2.3 oz (118 kg), SpO2 97 %.   General: Well developed, well nourished, in no acute distress Head: Eyes PERRLA, No xanthomas.   Normal cephalic and atramatic  Lungs:   Clear bilaterally to auscultation and percussion. Normal respiratory effort. No wheezes, no rales. Heart:   HRRR S1 S2 Pulses are 2+ & equal. 3/6 crescendo decrescendo late peaking systolic murmur, no rubs, gallops. Distant heart sounds Radiation of murmur to carotids. No JVD.  No abdominal bruits.  Abdomen: Bowel sounds are positive, abdomen soft and non-tender without masses. No hepatosplenomegaly. Obese Msk:  Back normal. Normal strength and tone for age. Extremities:  No clubbing, cyanosis or edema.  DP +1 Neuro: Alert, non-focal, MAE x 4 GU: Deferred Rectal: Deferred Psych:  Good affect, responds appropriately      Labs: Lab Results  Component Value Date   WBC 5.9 04/21/2014   HGB 9.7* 04/21/2014   HCT 31.0* 04/21/2014   MCV 93.4 04/21/2014   PLT 138* 04/21/2014     Recent Labs Lab 04/19/14 0750  04/21/14 0405  NA 139  < > 140  K 3.8  < > 4.2  CL 104  < > 109  CO2 25  < > 27  BUN 34*  < > 18  CREATININE 1.69*  < > 1.10  CALCIUM 8.5  < > 8.5  PROT 6.0  --   --   BILITOT 0.7  --   --   ALKPHOS 54  --   --   ALT 14  --   --   AST 22  --   --   GLUCOSE 127*  < > 128*  < > = values in this interval not displayed. No results for input(s): CKTOTAL, CKMB, TROPONINI in the last 72 hours. No results found for: CHOL, HDL, LDLCALC, TRIG Lab Results  Component Value Date   DDIMER 0.73* 04/18/2014       EKG: 04/20/14- Atrial fibrillation, 80 bpm, right bundle branch block, inferior infarct pattern Personally viewed.   Cardiac catheterization: 04/02/14-mild proximal LAD disease of 30%-unable to cross aortic valve due to severe aortic stenosis.  Echocardiogram: 04/19/14 - Technically difficult; normal LV function; moderate LVH; severe LAE; mild RAE; severe AS (mean gradient 62 mmHg); mild to moderately dilated aortic root; suggest CTA or MRA to further assess.  VQ scan  04/18/14-normal   Scheduled Meds: . albuterol      . antiseptic oral rinse  7 mL Mouth Rinse BID  . aspirin EC  325 mg Oral Daily  . [START ON 04/24/2014]  ceFAZolin (ANCEF) IV  2 g Intravenous Once  . donepezil  10 mg Oral QHS  . folic acid  1 mg Oral Daily  . gabapentin  400 mg Oral TID  . heparin  5,000 Units Subcutaneous 3 times per day  . insulin aspart  0-15 Units Subcutaneous TID WC  . multivitamin with minerals  1 tablet Oral Daily  . pantoprazole  40 mg Oral Daily  . polyethylene glycol  17 g Oral Daily  . simvastatin  20 mg Oral Daily  . sodium chloride  3 mL Intravenous Q12H  . tamsulosin  0.4 mg Oral Daily  . thiamine  100 mg Oral Daily  . venlafaxine XR  150 mg Oral Q breakfast   Continuous Infusions:  PRN Meds:.acetaminophen **OR** acetaminophen, ondansetron **OR** ondansetron (ZOFRAN) IV, oxyCODONE   ASSESSMENT/PLAN:    74 year old male with severe aortic stenosis, symptomatic with syncopal episode.  1. Aortic stenosis-severe by echocardiogram. Awaiting possible surgery. Cardiothoracic surgery has been present on this admission. Syncope with severe aortic stenosis increases likelihood of mortality over the next year if not sooner. He states that he is having dental work performed tomorrow. He may be going for surgery on Friday he states. 30% proximal LAD disease.  2. Atrial fibrillation-currently rate controlled however he does have periods of rapid ventricular response. If heart rate becomes  an issue, low-dose metoprolol may be helpful. Be careful with hypotension however.  3. Acute kidney injury-improved since admission. Current creatinine 1.1, previous 1.7  4. Syncope-severe aortic stenosis as culprit. Agree with stopping antihypertensives. Hydration has helped as well.  5. Memory impairment-continue with current medication.  6. Hyperlipidemia-on low-dose simvastatin. Continue.  Awaiting surgery. We will follow along with you.   Donato SchultzSKAINS, Narek Kniss, MD  04/23/2014  6:26 PM

## 2014-04-24 ENCOUNTER — Encounter (HOSPITAL_COMMUNITY)
Admission: EM | Disposition: A | Discharge status: Home Health | Payer: Self-pay | Source: Home / Self Care | Attending: Thoracic Surgery (Cardiothoracic Vascular Surgery)

## 2014-04-24 ENCOUNTER — Inpatient Hospital Stay (HOSPITAL_COMMUNITY): Payer: Medicare Other | Admitting: Anesthesiology

## 2014-04-24 DIAGNOSIS — I482 Chronic atrial fibrillation, unspecified: Secondary | ICD-10-CM | POA: Insufficient documentation

## 2014-04-24 DIAGNOSIS — N179 Acute kidney failure, unspecified: Secondary | ICD-10-CM | POA: Insufficient documentation

## 2014-04-24 DIAGNOSIS — I350 Nonrheumatic aortic (valve) stenosis: Secondary | ICD-10-CM | POA: Insufficient documentation

## 2014-04-24 DIAGNOSIS — R509 Fever, unspecified: Secondary | ICD-10-CM

## 2014-04-24 DIAGNOSIS — K0530 Chronic periodontitis, unspecified: Secondary | ICD-10-CM

## 2014-04-24 DIAGNOSIS — K083 Retained dental root: Secondary | ICD-10-CM

## 2014-04-24 DIAGNOSIS — K029 Dental caries, unspecified: Secondary | ICD-10-CM

## 2014-04-24 HISTORY — PX: MULTIPLE EXTRACTIONS WITH ALVEOLOPLASTY: SHX5342

## 2014-04-24 LAB — CBC
HEMATOCRIT: 32.8 % — AB (ref 39.0–52.0)
HEMATOCRIT: 34.8 % — AB (ref 39.0–52.0)
HEMOGLOBIN: 10.2 g/dL — AB (ref 13.0–17.0)
HEMOGLOBIN: 10.9 g/dL — AB (ref 13.0–17.0)
MCH: 29.3 pg (ref 26.0–34.0)
MCH: 29 pg (ref 26.0–34.0)
MCHC: 31.1 g/dL (ref 30.0–36.0)
MCHC: 31.3 g/dL (ref 30.0–36.0)
MCV: 94.3 fL (ref 78.0–100.0)
MCV: 92.6 fL (ref 78.0–100.0)
Platelets: 152 10*3/uL (ref 150–400)
Platelets: 163 10*3/uL (ref 150–400)
RBC: 3.48 MIL/uL — ABNORMAL LOW (ref 4.22–5.81)
RBC: 3.76 MIL/uL — ABNORMAL LOW (ref 4.22–5.81)
RDW: 13.6 % (ref 11.5–15.5)
RDW: 13.6 % (ref 11.5–15.5)
WBC: 6.2 10*3/uL (ref 4.0–10.5)
WBC: 8.9 10*3/uL (ref 4.0–10.5)

## 2014-04-24 LAB — CULTURE, BLOOD (ROUTINE X 2)
CULTURE: NO GROWTH
Culture: NO GROWTH

## 2014-04-24 LAB — BASIC METABOLIC PANEL
Anion gap: 6 (ref 5–15)
BUN: 15 mg/dL (ref 6–23)
CO2: 27 mmol/L (ref 19–32)
Calcium: 8.6 mg/dL (ref 8.4–10.5)
Chloride: 105 mmol/L (ref 96–112)
Creatinine, Ser: 0.96 mg/dL (ref 0.50–1.35)
GFR, EST NON AFRICAN AMERICAN: 81 mL/min — AB (ref >90)
Glucose, Bld: 115 mg/dL — ABNORMAL HIGH (ref 70–99)
POTASSIUM: 4.3 mmol/L (ref 3.5–5.1)
Sodium: 138 mmol/L (ref 135–145)

## 2014-04-24 LAB — GLUCOSE, CAPILLARY
GLUCOSE-CAPILLARY: 115 mg/dL — AB (ref 70–99)
GLUCOSE-CAPILLARY: 105 mg/dL — AB (ref 70–99)
GLUCOSE-CAPILLARY: 135 mg/dL — AB (ref 70–99)
Glucose-Capillary: 138 mg/dL — ABNORMAL HIGH (ref 70–99)
Glucose-Capillary: 117 mg/dL — ABNORMAL HIGH (ref 70–99)
Glucose-Capillary: 143 mg/dL — ABNORMAL HIGH (ref 70–99)

## 2014-04-24 SURGERY — MULTIPLE EXTRACTION WITH ALVEOLOPLASTY
Anesthesia: General | Site: Mouth

## 2014-04-24 MED ORDER — DEXTROSE 5 % IV SOLN
10.0000 mg | INTRAVENOUS | Status: DC | PRN
  Administered 2014-04-24: 20 ug/min via INTRAVENOUS

## 2014-04-24 MED ORDER — SODIUM CHLORIDE 0.9 % IR SOLN
200.0000 mL | Status: DC
  Administered 2014-04-24 – 2014-04-25 (×2): 200 mL

## 2014-04-24 MED ORDER — 0.9 % SODIUM CHLORIDE (POUR BTL) OPTIME
TOPICAL | Status: DC | PRN
Start: 2014-04-24 — End: 2014-04-24
  Administered 2014-04-24: 1000 mL

## 2014-04-24 MED ORDER — PROMETHAZINE HCL 25 MG/ML IJ SOLN: 6.2500 mg | INTRAMUSCULAR | Status: DC | PRN

## 2014-04-24 MED ORDER — FENTANYL CITRATE 0.05 MG/ML IJ SOLN
INTRAMUSCULAR | Status: AC
  Filled 2014-04-24: qty 5

## 2014-04-24 MED ORDER — HEMOSTATIC AGENTS (NO CHARGE) OPTIME
TOPICAL | Status: DC | PRN
  Administered 2014-04-24: 1 via TOPICAL

## 2014-04-24 MED ORDER — LIDOCAINE-EPINEPHRINE 2 %-1:100000 IJ SOLN
INTRAMUSCULAR | Status: AC
  Filled 2014-04-24: qty 10.2

## 2014-04-24 MED ORDER — LIDOCAINE-EPINEPHRINE 2 %-1:100000 IJ SOLN
INTRAMUSCULAR | Status: DC | PRN
  Administered 2014-04-24: 7.2 mL

## 2014-04-24 MED ORDER — LIDOCAINE HCL 4 % MT SOLN
OROMUCOSAL | Status: DC | PRN
  Administered 2014-04-24: 4 mL via TOPICAL

## 2014-04-24 MED ORDER — PROPOFOL 10 MG/ML IV BOLUS
INTRAVENOUS | Status: AC
  Filled 2014-04-24: qty 20

## 2014-04-24 MED ORDER — HYDROMORPHONE HCL 1 MG/ML IJ SOLN: 0.2500 mg | INTRAMUSCULAR | Status: DC | PRN

## 2014-04-24 MED ORDER — OXYCODONE HCL 5 MG PO TABS
ORAL_TABLET | ORAL | Status: AC
  Filled 2014-04-24: qty 3

## 2014-04-24 MED ORDER — BUPIVACAINE-EPINEPHRINE 0.5% -1:200000 IJ SOLN
INTRAMUSCULAR | Status: DC | PRN
Start: 2014-04-24 — End: 2014-04-24
  Administered 2014-04-24: 1.8 mL

## 2014-04-24 MED ORDER — LACTATED RINGERS IV SOLN
INTRAVENOUS | Status: DC | PRN
  Administered 2014-04-24: 08:00:00 via INTRAVENOUS

## 2014-04-24 MED ORDER — ONDANSETRON HCL 4 MG/2ML IJ SOLN
INTRAMUSCULAR | Status: DC | PRN
  Administered 2014-04-24: 4 mg via INTRAVENOUS

## 2014-04-24 MED ORDER — AMINOCAPROIC ACID SOLUTION 5% (50 MG/ML)
10.0000 mL | ORAL | Status: DC
  Filled 2014-04-24: qty 100

## 2014-04-24 MED ORDER — SUCCINYLCHOLINE CHLORIDE 20 MG/ML IJ SOLN
INTRAMUSCULAR | Status: DC | PRN
  Administered 2014-04-24: 120 mg via INTRAVENOUS

## 2014-04-24 MED ORDER — PROPOFOL 10 MG/ML IV BOLUS
INTRAVENOUS | Status: DC | PRN
  Administered 2014-04-24: 100 mg via INTRAVENOUS

## 2014-04-24 MED ORDER — FENTANYL CITRATE 0.05 MG/ML IJ SOLN
INTRAMUSCULAR | Status: DC | PRN
  Administered 2014-04-24: 50 ug via INTRAVENOUS
  Administered 2014-04-24: 100 ug via INTRAVENOUS
  Administered 2014-04-24: 50 ug via INTRAVENOUS

## 2014-04-24 MED ORDER — MORPHINE SULFATE 2 MG/ML IJ SOLN
2.0000 mg | INTRAMUSCULAR | Status: DC | PRN
  Administered 2014-04-24: 2 mg via INTRAVENOUS
  Filled 2014-04-24: qty 1

## 2014-04-24 MED ORDER — BUPIVACAINE-EPINEPHRINE (PF) 0.5% -1:200000 IJ SOLN
INTRAMUSCULAR | Status: AC
  Filled 2014-04-24: qty 3.6

## 2014-04-24 MED ORDER — LACTATED RINGERS IV SOLN
INTRAVENOUS | Status: DC
  Administered 2014-04-24: 14:00:00 via INTRAVENOUS

## 2014-04-24 SURGICAL SUPPLY — 33 items
ALCOHOL 70% 16 OZ (MISCELLANEOUS) ×2 IMPLANT
ATTRACTOMAT 16X20 MAGNETIC DRP (DRAPES) ×2 IMPLANT
BLADE SURG 15 STRL LF DISP TIS (BLADE) ×2 IMPLANT
BLADE SURG 15 STRL SS (BLADE) ×2
COVER SURGICAL LIGHT HANDLE (MISCELLANEOUS) ×2 IMPLANT
GAUZE PACKING FOLDED 2  STR (GAUZE/BANDAGES/DRESSINGS) ×1
GAUZE PACKING FOLDED 2 STR (GAUZE/BANDAGES/DRESSINGS) ×1 IMPLANT
GAUZE SPONGE 4X4 12PLY STRL (GAUZE/BANDAGES/DRESSINGS) ×2 IMPLANT
GAUZE SPONGE 4X4 16PLY XRAY LF (GAUZE/BANDAGES/DRESSINGS) ×2 IMPLANT
GLOVE BIOGEL PI IND STRL 6 (GLOVE) ×1 IMPLANT
GLOVE BIOGEL PI INDICATOR 6 (GLOVE) ×1
GLOVE SURG ORTHO 8.0 STRL STRW (GLOVE) ×2 IMPLANT
GLOVE SURG SS PI 6.0 STRL IVOR (GLOVE) ×2 IMPLANT
GOWN STRL REUS W/ TWL LRG LVL3 (GOWN DISPOSABLE) ×1 IMPLANT
GOWN STRL REUS W/TWL 2XL LVL3 (GOWN DISPOSABLE) ×2 IMPLANT
GOWN STRL REUS W/TWL LRG LVL3 (GOWN DISPOSABLE) ×1
HEMOSTAT SURGICEL 2X14 (HEMOSTASIS) IMPLANT
KIT BASIN OR (CUSTOM PROCEDURE TRAY) ×2 IMPLANT
KIT ROOM TURNOVER OR (KITS) ×2 IMPLANT
MANIFOLD NEPTUNE WASTE (CANNULA) ×2 IMPLANT
NEEDLE BLUNT 16X1.5 OR ONLY (NEEDLE) ×2 IMPLANT
NS IRRIG 1000ML POUR BTL (IV SOLUTION) ×2 IMPLANT
PACK EENT II TURBAN DRAPE (CUSTOM PROCEDURE TRAY) ×2 IMPLANT
PAD ARMBOARD 7.5X6 YLW CONV (MISCELLANEOUS) ×4 IMPLANT
SPONGE SURGIFOAM ABS GEL 100 (HEMOSTASIS) ×2 IMPLANT
SPONGE SURGIFOAM ABS GEL 12-7 (HEMOSTASIS) IMPLANT
SPONGE SURGIFOAM ABS GEL SZ50 (HEMOSTASIS) IMPLANT
SUCTION FRAZIER TIP 10 FR DISP (SUCTIONS) ×2 IMPLANT
SUT CHROMIC 3 0 PS 2 (SUTURE) ×6 IMPLANT
SYR 50ML SLIP (SYRINGE) ×2 IMPLANT
TOWEL OR 17X26 10 PK STRL BLUE (TOWEL DISPOSABLE) ×2 IMPLANT
TUBE CONNECTING 12X1/4 (SUCTIONS) ×2 IMPLANT
YANKAUER SUCT BULB TIP NO VENT (SUCTIONS) ×2 IMPLANT

## 2014-04-24 NOTE — Progress Notes (Signed)
PRE-OPERATIVE NOTE:  04/24/2014 Peter Weiss [id number]  VITALS: BP 122/75 mmHg  Pulse 86  Temp(Src) 98.3 F (36.8 C) (Oral)  Resp 18  Ht 5\' 11"  (1.803 m)  Wt 256 lb 11.2 oz (116.438 kg)  BMI 35.82 kg/m2  SpO2 95%  Lab Results  Component Value Date   WBC 6.2 04/24/2014   HGB 10.2* 04/24/2014   HCT 32.8* 04/24/2014   MCV 94.3 04/24/2014   PLT 152 04/24/2014   BMET    Component Value Date/Time   NA 138 04/24/2014 0446   K 4.3 04/24/2014 0446   CL 105 04/24/2014 0446   CO2 27 04/24/2014 0446   GLUCOSE 115* 04/24/2014 0446   BUN 15 04/24/2014 0446   CREATININE 0.96 04/24/2014 0446   CALCIUM 8.6 04/24/2014 0446   GFRNONAA 81* 04/24/2014 0446   GFRAA >90 04/24/2014 0446    Lab Results  Component Value Date   INR 1.20 04/19/2014   No results found for: PTT   Peter Weiss presents for multiple dental extractions with alveoloplasty and gross debridement of remaining dentition as well as sectioning of the lower right bridge from its distal abutment in the operating room.   SUBJECTIVE: The patient denies any acute medical or dental changes and agrees to proceed with treatment as planned.  EXAM: No sign of acute dental changes.  ASSESSMENT: Patient is affected by chronic apical periodontitis, multiple retained root segments, dental caries, chronic periodontitis, accretions, and suboptimal dental restorations.  PLAN: Patient agrees to proceed with treatment as planned in the operating room as previously discussed and accepts the risks, benefits, and complications of the proposed treatment. Patient is aware of the risk for bleeding, bruising, swelling, infection, pain, nerve damage, soft tissue damage, sinus involvement, root tip fracture, mandible fracture, and the risks of complications associated with the anesthesia. Patient is also aware of the potential for other complications up to and including death due to overall cardiovascular compromise.     Charlynne Panderonald F. Kulinski, DDS

## 2014-04-24 NOTE — Transfer of Care (Signed)
Immediate Anesthesia Transfer of Care Note  Patient: Peter Weiss  Procedure(s) Performed: Procedure(s): Extraction of tooth #'s 2,3,5,12,13,14,15,20 with allveoloplasty and gross debridement of remaining dentition and sectioning of bridge at mesial of abutment tooth #32. (N/A)  Patient Location: PACU  Anesthesia Type:General  Level of Consciousness: awake, alert  and oriented  Airway & Oxygen Therapy: Patient Spontanous Breathing and Patient connected to nasal cannula oxygen  Post-op Assessment: Report given to RN and Post -op Vital signs reviewed and stable  Post vital signs: Reviewed and stable  Last Vitals:  Filed Vitals:   04/24/14 1018  BP: 103/57  Pulse: 86  Temp:   Resp: 12    Complications: No apparent anesthesia complications

## 2014-04-24 NOTE — Anesthesia Postprocedure Evaluation (Signed)
  Anesthesia Post-op Note  Patient: Peter Weiss  Procedure(s) Performed: Procedure(s): Extraction of tooth #'s 2,3,5,12,13,14,15,20 with allveoloplasty and gross debridement of remaining dentition and sectioning of bridge at mesial of abutment tooth #32. (N/A)  Patient Location: PACU  Anesthesia Type:General  Level of Consciousness: awake and alert   Airway and Oxygen Therapy: Patient Spontanous Breathing  Post-op Pain: none  Post-op Assessment: Post-op Vital signs reviewed  Post-op Vital Signs: Reviewed  Last Vitals:  Filed Vitals:   04/24/14 1145  BP: 135/81  Pulse: 78  Temp:   Resp: 14    Complications: No apparent anesthesia complications

## 2014-04-24 NOTE — Progress Notes (Signed)
RT placed patient on CPAP via nasal mask with 3 L of O2 bled in.  Patient is tolerating at this time. RT will continue to monitor.

## 2014-04-24 NOTE — Anesthesia Preprocedure Evaluation (Addendum)
Anesthesia Evaluation  Patient identified by MRN, date of birth, ID band Patient awake    Reviewed: Allergy & Precautions, NPO status , Patient's Chart, lab work & pertinent test results  Airway Mallampati: III  TM Distance: >3 FB Neck ROM: Full    Dental   Pulmonary neg pulmonary ROS,  breath sounds clear to auscultation        Cardiovascular hypertension, Pt. on medications and Pt. on home beta blockers + Valvular Problems/Murmurs AS Rhythm:Irregular Rate:Normal + Systolic murmurs NL LV function. Critical Aortic stenosis. Moderate LVH   Neuro/Psych negative neurological ROS     GI/Hepatic Neg liver ROS, GERD-  ,  Endo/Other  diabetes, Type obesity  Renal/GU negative Renal ROS     Musculoskeletal  (+) Arthritis -,   Abdominal   Peds  Hematology  (+) anemia ,   Anesthesia Other Findings   Reproductive/Obstetrics                            Anesthesia Physical Anesthesia Plan  ASA: IV  Anesthesia Plan: General   Post-op Pain Management:    Induction: Intravenous  Airway Management Planned: Nasal ETT and Oral ETT  Additional Equipment: Arterial line  Intra-op Plan:   Post-operative Plan: Extubation in OR  Informed Consent: I have reviewed the patients History and Physical, chart, labs and discussed the procedure including the risks, benefits and alternatives for the proposed anesthesia with the patient or authorized representative who has indicated his/her understanding and acceptance.   Dental advisory given  Plan Discussed with: CRNA  Anesthesia Plan Comments:         Anesthesia Quick Evaluation

## 2014-04-24 NOTE — Progress Notes (Signed)
[facility]                                                                              Patient Demographics  Peter Weiss, is a 74 y.o. male, DOB - 1940/04/01, NWG:[id number]  Admit date - 04/18/2014   Admitting Physician Yevonne PaxSaadat A Khan, MD  Outpatient Primary MD for the patient is Doss, Oleh Geninarrie M, NP  LOS - 6   Chief Complaint  Patient presents with  . Shortness of Breath      Admit HPI/Brief Narrative 74 y.o. male who presented after a syncopal episode. He was confused with his days. He stated his memory has been poor. Patient stated he was going to stand up and he passed out. He had no chest pain. He had noted dizziness when he stands up. He stated he had a recent cardiac cath which he thought was "a few days ago" but in reality he had it done in January. [facility] had the initial cardiac event. Patient has a significant history of aortic stenosis. Patient was noted to have a low BP in the ED.   His inpatient w/u has revealed critical AoS. He is currently undergoing evaluation/preop planning for an AoVR. Patient was seen by dental medicine for extraction of multiple teeth before this procedure.  Assessment & Plan   Critical Aortic Stenosis -[facility]iac [facility] suggests very tight valve / inability to cross w/ catheter  -TTE notes valve area of ~0.6cm2 and gradient of ~8560mmHg -Cardiology and cardiothoracic surgery consulted and appreciated -Plan is for tissue AoVR on Friday, 04/26/2014  Syncope  -Likely secondary to aortic stenosis vs medications -PT and OT consulted  Multiple carious teeth  -Seen by dental medicine -patient had multiple teeth extractions today  Newly diagnosed but likely subacute or chronic Afib -Appears to date to at least January 2016 as is mentioned in an EKG from [facility] -agree w/ TCTS that he is not safe for anticoag given his apparent chronic cognitive impairment and high fall risk -Cardiology  consulted and appreciated, recommended low dose metoprolol if rate control becomes an issue  Hypotension w/ a hx of HTN -[facility] + BP meds in the setting of critical AoS - BP has now normalized   Acute kidney injury  -Resolved with IVF  Modestly elevated temp -Resolved, has been afebrile for over 24hours -No leukocytosis, UA/CXR no infection  Hyperlipidemia -Continue statin  Diabetes Mellitus, type 2 -Continue ISS with CBG monitoring -Hemoglobin A1c 7  Dementia  -Appears to cary a diagnosis of dementia as he is on aricept chronically  -unclear extent of prior workup  Code Status: Full   Family Communication: None at bedside  Disposition Plan: Admitted.  Will likely have surgery on 04/26/2014  Time Spent in minutes   30 minutes  Procedures  Procedures: 2/5 carotid dopplers - 40-59% R ICA stenosis - 1-39% L ICA stenosis - vertebral flow antegrade  2/5 TTE - EF 55-60% - moderate LVH - severe AS (gradient 62mmHg)  Consults   Cardiology Cardiothoracic surgery Dental medicine  DVT Prophylaxis  SCDs  Lab Results  Component Value Date   PLT 152 04/24/2014  Medications  Scheduled Meds: . antiseptic oral rinse  7 mL Mouth Rinse BID  . aspirin EC  325 mg Oral Daily  . donepezil  10 mg Oral QHS  . folic acid  1 mg Oral Daily  . gabapentin  400 mg Oral TID  . insulin aspart  0-15 Units Subcutaneous TID WC  . multivitamin with minerals  1 tablet Oral Daily  . pantoprazole  40 mg Oral Daily  . polyethylene glycol  17 g Oral Daily  . simvastatin  20 mg Oral Daily  . sodium chloride  3 mL Intravenous Q12H  . tamsulosin  0.4 mg Oral Daily  . thiamine  100 mg Oral Daily  . venlafaxine XR  150 mg Oral Q breakfast   Continuous Infusions: . lactated ringers 10 mL/hr at 04/24/14 1352  . sodium chloride irrigation     PRN Meds:.acetaminophen **OR** acetaminophen, ondansetron **OR** ondansetron (ZOFRAN) IV, oxyCODONE  Antibiotics   Anti-infectives    Start      Dose/Rate Route Frequency Ordered Stop   04/24/14 0600  ceFAZolin (ANCEF) IVPB 2 g/50 mL premix     2 g 100 mL/hr over 30 Minutes Intravenous  Once 04/23/14 1402 04/24/14 0837   04/18/14 1830  cefTRIAXone (ROCEPHIN) 1 g in dextrose 5 % 50 mL IVPB  Status:  Discontinued     1 g 100 mL/hr over 30 Minutes Intravenous Every 24 hours 04/18/14 1830 04/19/14 1432        Subjective:   Peter Old seen and examined today.  Patient recently returned from teeth extraction.  He states he is in pain.  Denies SOB, chest pain.    Objective:   Filed Vitals:   04/24/14 1102 04/24/14 1117 04/24/14 1124 04/24/14 1145  BP: 106/62 119/69  135/81  Pulse: 81 82  78  Temp:   97.9 F (36.6 C)   TempSrc:      Resp: Height:      Weight:      SpO2: 96% 99%  98%    Wt Readings from Last 3 Encounters:  04/24/14 116.438 kg (256 lb 11.2 oz)  03/21/14 116.121 kg (256 lb)  09/28/13 116.234 kg (256 lb 4 oz)     Intake/Output Summary (Last 24 hours) at 04/24/14 1518 Last data filed at 04/24/14 1500  Gross per 24 hour  Intake 1401.33 ml  Output    100 ml  Net 1301.33 ml    Exam  General: Well developed, well nourished, NAD, appears stated age  HEENT: NCAT,  mucous membranes moist. Mouth packed with gauze  Cardiovascular: S1 S2 auscultated, 3/6 SEM  Respiratory: Clear to auscultation bilaterally with equal chest rise  Abdomen: Soft, obese, nontender, nondistended, + bowel sounds  Extremities: warm dry without cyanosis clubbing or edema  Neuro: AAOx3, nonfocal  Psych: Normal affect and demeanor with intact judgement and insight  Data Review   Micro Results Recent Results (from the past 240 hour(s))  Blood Culture (routine x 2)     Status: None   Collection Time: 04/18/14  3:20 PM  Result Value Ref Range Status   Specimen Description BLOOD LEFT ANTECUBITAL  Final   Special Requests BOTTLES DRAWN AEROBIC AND ANAEROBIC  Final   Culture   Final    NO GROWTH 5  DAYS Performed at [facility]    Report Status 04/24/2014 FINAL  Final  Blood Culture (routine x 2)     Status: None   Collection Time:  04/18/14  3:30 PM  Result Value Ref Range Status   Specimen Description BLOOD LEFT ARM  Final   Special Requests BOTTLES DRAWN AEROBIC AND ANAEROBIC  Final   Culture   Final    NO GROWTH 5 DAYS Performed at [facility]    Report Status 04/24/2014 FINAL  Final  Urine culture     Status: None   Collection Time: 04/18/14  4:00 PM  Result Value Ref Range Status   Specimen Description URINE, CLEAN CATCH  Final   Special Requests NONE  Final   Colony Count   Final    6,000 COLONIES/ML Performed at [facility]    Culture   Final    INSIGNIFICANT GROWTH Performed at [facility]    Report Status 04/20/2014 FINAL  Final  MRSA PCR Screening     Status: None   Collection Time: 04/18/14  8:51 PM  Result Value Ref Range Status   MRSA by PCR NEGATIVE NEGATIVE Final    Comment:        The GeneXpert MRSA Assay (FDA approved for NASAL specimens only), is one component of a comprehensive MRSA colonization surveillance program. It is not intended to diagnose MRSA infection nor to guide or monitor treatment for MRSA infections.     Radiology Reports Dg Orthopantogram  04/22/2014   ADDENDUM REPORT: 04/22/2014 14:33  ADDENDUM: An additional view has been performed. The right mandible is better demonstrated. Left-sided dental caries are again demonstrated. No obvious bone abscess. The right mandible appears normal. No bone abscess. Most of the maxillary teeth are missing. The right maxillary teeth medially demonstrate dental caries.   Electronically Signed   By: Loralie Champagne M.D.   On: 04/22/2014 14:33   04/22/2014   CLINICAL DATA:  Dental caries.  EXAM: ORTHOPANTOGRAM/PANORAMIC  COMPARISON:  None.  FINDINGS: The mandibular condyles are normally located. No mandible fracture. Left-sided dental caries are noted. The  right-sided maxillary and mandibular teeth are not well seen due to blurring.  IMPRESSION: Left-sided dental caries.  Electronically Signed: By: Loralie Champagne M.D. On: 04/20/2014 22:44   Dg Chest 2 View  04/18/2014   CLINICAL DATA:  Shortness of breath for 1 day  EXAM: CHEST  2 VIEW  COMPARISON:  Chest x-ray of 03/21/2014  FINDINGS: No active infiltrate or effusion is seen. Cardiomegaly is stable. Mild eventration at the right lung base is unchanged. The bones are osteopenic.  IMPRESSION: Stable cardiomegaly.  No active lung disease.   Electronically Signed   By: Dwyane Dee M.D.   On: 04/18/2014 17:14   Nm Pulmonary Perf And Vent  04/18/2014   CLINICAL DATA:  Dyspnea, history hypertension, diabetes, heart murmur  EXAM: NUCLEAR MEDICINE VENTILATION - PERFUSION LUNG SCAN  TECHNIQUE: Ventilation images were obtained in multiple projections using inhaled aerosol technetium 99 M DTPA. Perfusion images were obtained in multiple projections after intravenous injection of Tc-73m MAA.  RADIOPHARMACEUTICALS:  40 mCi Tc-93m DTPA aerosol and 6 mCi Tc-71m MAA  COMPARISON:  None; correlation chest radiograph 04/18/2014  FINDINGS: Ventilation: Enlargement of cardiac silhouette.  Otherwise normal.  Perfusion: Enlargement of cardiac silhouette.  Otherwise normal.  Chest radiograph: Enlargement of cardiac silhouette  IMPRESSION: Cardiomegaly.  Otherwise normal ventilation and perfusion lung scan.   Electronically Signed   By: Ulyses Southward M.D.   On: 04/18/2014 19:55    CBC  Recent Labs Lab 04/18/14 1404 04/19/14 0750 04/20/14 0327 04/21/14 0405 04/24/14 0446  WBC 8.0 5.9 7.7 5.9  6.2  HGB 11.8* 10.0* 9.7* 9.7* 10.2*  HCT 38.2* 31.8* 30.4* 31.0* 32.8*  PLT 203 182 159 138* 152  MCV 93.2 92.4 93.0 93.4 94.3  MCH 28.8 29.1 29.7 29.2 29.3  MCHC 30.9 31.4 31.9 31.3 31.1  RDW 13.7 13.6 13.6 13.5 13.6  LYMPHSABS 1.1  --   --   --   --   MONOABS 0.6  --   --   --   --   EOSABS 0.3  --   --   --   --   BASOSABS 0.0   --   --   --   --     Chemistries   Recent Labs Lab 04/18/14 1404 04/19/14 0750 04/20/14 0327 04/21/14 0405 04/24/14 0446  NA 138 139 140 140 138  K 3.8 3.8 3.4* 4.2 4.3  CL 99 104 109 109 105  CO2 28 25 25 27 27   GLUCOSE 132* 127* 139* 128* 115*  BUN 30* 34* 27* 18 15  CREATININE 2.49* 1.69* 1.22 1.10 0.96  CALCIUM 9.5 8.5 8.5 8.5 8.6  AST 25 22  --   --   --   ALT 16 14  --   --   --   ALKPHOS 72 54  --   --   --   BILITOT 0.6 0.7  --   --   --    ------------------------------------------------------------------------------------------------------------------ estimated creatinine clearance is 91.5 mL/min (by C-G formula based on Cr of 0.96). ------------------------------------------------------------------------------------------------------------------ No results for input(s): HGBA1C in the last 72 hours. ------------------------------------------------------------------------------------------------------------------ No results for input(s): CHOL, HDL, LDLCALC, TRIG, CHOLHDL, LDLDIRECT in the last 72 hours. ------------------------------------------------------------------------------------------------------------------ No results for input(s): TSH, T4TOTAL, T3FREE, THYROIDAB in the last 72 hours.  Invalid input(s): FREET3 ------------------------------------------------------------------------------------------------------------------ No results for input(s): VITAMINB12, FOLATE, FERRITIN, TIBC, IRON, RETICCTPCT in the last 72 hours.  Coagulation profile  Recent Labs Lab 04/19/14 0750  INR 1.20    No results for input(s): DDIMER in the last 72 hours.  Cardiac Enzymes  Recent Labs Lab 04/18/14 2114 04/19/14 0327 04/19/14 0750  TROPONINI 0.06* 0.06* 0.05*   ------------------------------------------------------------------------------------------------------------------ Invalid input(s): POCBNP    Alea Ryer D.O. on 04/24/2014 at 3:18  PM  Between 7am to 7pm - Pager - [phone]  After 7pm go to [email or web address] - password [email or web address]  And look for the night coverage person covering for me after hours  [facility] Office  [phone]

## 2014-04-24 NOTE — Op Note (Signed)
OPERATIVE REPORT  Patient:            Peter Weiss Date of Birth:  September 07, 1940 MRN:                [id number]   DATE OF PROCEDURE:  04/24/2014  PREOPERATIVE DIAGNOSES: 1. Aortic stenosis 2. Pre-aortic valve replacement dental protocol 3. Chronic apical periodontitis 4. Multiple retained root segments 5. Dental caries 6. Suboptimal dental restorations 7. Chronic periodontitis 8. Accretions   POSTOPERATIVE DIAGNOSES: 1. Aortic stenosis 2. Pre-aortic valve replacement dental protocol 3. Chronic apical periodontitis 4. Multiple retained root segments 5. Dental caries 6. Suboptimal dental restorations 7. Chronic periodontitis 8. Accretions  OPERATIONS: 1. Multiple extraction of tooth numbers 2, 3, 5, 12, 13, 14, 15, 20 2. 3 Quadrants of alveoloplasty 3. Gross debridement of remaining dentition 4. Sectioning of bridge at mesial of abutment teeth #32    SURGEON: Charlynne Pander, DDS  ASSISTANT: Rory Percy, (dental assistant)  ANESTHESIA: General anesthesia via oral endotracheal tube.  MEDICATIONS: 1. Ancef 2 g IV prior to invasive dental procedures. 2. Local anesthesia with a total utilization of 4 carpules each containing 34 mg of lidocaine with 0.017 mg of epinephrine as well as 1 carpules each containing 9 mg of bupivacaine with 0.009 mg of epinephrine.  SPECIMENS: There are 8 teeth that were discarded.  DRAINS: None  CULTURES: None  COMPLICATIONS: None   ESTIMATED BLOOD LOSS: 100  mLs.  INTRAVENOUS FLUIDS: 900 mLs of Lactated ringers solution.  INDICATIONS: The patient was recently diagnosed with  severe aortic stenosis.  A dental consultation was then requested to evaluate poor dentition as part of a pre-heart valve surgery dental protocol.  The patient was examined and treatment planned for multiple extractions with alveoloplasty and gross debridement of remaining dentition with sectioning of the bridge at abutment teeth #32 to remove pontics  30 and 31.  This treatment plan was formulated to decrease the risks and complications associated with dental infection from affecting the patient's systemic health and the anticipated aortic valve replacement .  OPERATIVE FINDINGS: Patient was examined operating room number 7.  The teeth were identified for extraction.  Please note: Retained roots in the area of tooth numbers 2, 3, 5, 12, 14, 15 were noted at this time. Defective restoration on associated tooth numbers 14 and 20 were noted and also had 1+ tooth mobility. Therefore, tooth numbers 2, 3, 5, 12, 13, 14, 15, 20 were planned for extraction at this time. The patient was noted to be affected by chronic periodontitis,  accretions, dental caries, chronic apical periodontitis, multiple retained root segments, suboptimal dental restorations, and tooth mobility.   DESCRIPTION OF PROCEDURE: Patient was brought to the main operating room number 7. Patient was then placed in the supine position on the operating table.  General anesthesia was then induced per the anesthesia team. The patient was then prepped and draped in the usual manner for dental medicine procedure. A timeout was performed. The patient was identified and procedures were verified. A throat pack was placed at this time. The oral cavity was then thoroughly examined with the findings noted above. The patient was then ready for dental medicine procedure as follows:  Local anesthesia was then administered sequentially with a total utilization of 4 carpules each containing 34 mg of lidocaine with 0.017 mg of epinephrine as well as 1 carpules  each containing 9 mg bupivacaine with 0.009 mg of epinephrine.  The Maxillary left and right quadrants first approached. Anesthesia  was then delivered utilizing infiltration with lidocaine with epinephrine. A #15 blade incision was then made from the mesial of #1 and extended to the distal of #6.  A  surgical flap was then carefully reflected. The teeth  were then subluxated with a series of straight elevators. Retained roots in the area of numbers 2, 3, 5  were then removed with a 150 forceps and rongeur as indicated without complications. Alveoloplasty was then performed utilizing a ronguers and bone file. The surgical site was then irrigated with copious amounts of sterile saline. The tissues were approximated and trimmed appropriately. Surgifoam was placed the extraction socket 2-3 and #5. The surgical site was then closed from the mesial #1 and extended to the distal of #4 utilizing 3-0 chromic gut suture in a continuous interrupted suture technique 1. One figure-of-eight suture was then used to close the surgical site in the area of #5 .  At this point time, the maxillary left quadrant was approached. A 15 blade incision was made from the maxillary left tuberosity and extended to the distal of #11. A surgical flap was then carefully reflected. Appropriate amounts of buccal and interseptal bone was removed at this time with a rongeur. The teeth were then subluxated with a series of straight elevators. Tooth numbers 12, 13, 14, 15 were then removed with a 150 forceps and rongeur without complications. Alveoloplasty was performed utilizing a rongeur and bone file. The tissues were approximated and trimmed appropriately. The surgical site was then irrigated with copious amounts of sterile saline. The surgical site was then closed from the maxillary left tuberosity and extended to the distal of #11 utilizing 3-0 chromic gut suture in a continuous interrupted suture technique 1. A piece of Surgifoam was then placed in the extraction socket area numbers 15.  At this point time the mandibular left quadrant was approached. The patient was given a left inferior alveolar nerve blocks and long buccal nerve blocks utilizing the bupivacaine with epinephrine. Further infiltration was then achieved utilizing the lidocaine with epinephrine. A 15 blade incision was then  made from the distal of number 18 and extended to the mesial of #20.  A surgical flap was then carefully reflected. Appropriate amounts of buccal and interseptal bone were then removed utilizing a surgical handpiece and copious amount of sterile water around tooth #20. Tooth number 20 was then subluxated with a series of straight elevators. Tooth number 20 was then removed utilizing a 151 forceps. Alveoloplasty was then performed utilizing a rongeurs and bone file. The tissues were approximated and trimmed appropriately. The surgical sites were then irrigated with copious amounts of sterile saline. A piece of Surgifoam was placed the extraction socket #20. The mandibular left surgical site was then closed from the distal of  18 and extended to the distal of #21 utilizing 3-0 chromic gut suture in a continuous interrupted suture technique 1.  At this point time the remaining dentition was approached. A gross debridement procedure was performed utilizing a sonic scaler and a series of hand curettes. A sonic scaler was then again used to refine removal of accretions as needed. At this point time the bridge on the lower right quadrant was approached. The patient was sectioned on the mesial of abutment tooth #32 with surgical burs.. This allowed for removal of pontics in the area of tooth numbers 30 and 31. The metal on the mesial of #32 was then smoothened and polished appropriately. No retained root tips weres noted in the area of #  30.  At this point time, the entire mouth was irrigated with copious amounts of sterile saline. The patient was examined for complications, seeing none, the dental medicine procedure was deemed to be complete. The throat pack was removed at this time. An oral airway was then placed at the request of the anesthesia team. A series of 4 x 4 gauze were placed in the mouth to aid hemostasis. The patient was then handed over to the anesthesia team for final disposition. After an appropriate  amount of time, the patient was extubated and taken to the postanesthsia care unit in good condition. All counts were correct for the dental medicine procedure. The patient is to use Amicar 5% oral rinse. Patient is rinse with 10 mL area of the next 10 hours to aid hemostasis. Heparin subcutaneously is to be withheld until tomorrow. SCDs will be used in the interim as indicated. Patient will proceed with aortic valve replacement with Dr. Dorris FetchHendrickson as per his discretion.   Charlynne Panderonald F. Kulinski, DDS.

## 2014-04-24 NOTE — Progress Notes (Signed)
TELEMETRY: Reviewed telemetry pt in Atrial fibrillation with rate 86 bpm.: Filed Vitals:   04/24/14 1102 04/24/14 1117 04/24/14 1124 04/24/14 1145  BP: 106/62 119/69  135/81  Pulse: 81 82  78  Temp:   97.9 F (36.6 C)   TempSrc:      Resp: Height:      Weight:      SpO2: 96% 99%  98%    Intake/Output Summary (Last 24 hours) at 04/24/14 1251 Last data filed at 04/24/14 1013  Gross per 24 hour  Intake   1582 ml  Output    100 ml  Net   1482 ml   Filed Weights   04/21/14 0500 04/22/14 0500 04/24/14 0519  Weight: 258 lb 13.1 oz (117.4 kg) 260 lb 2.3 oz (118 kg) 256 lb 11.2 oz (116.438 kg)    Subjective Mouth packed with gauze. Sleepy from sedation and pain meds. No cardiac complaints.  Marland Kitchen antiseptic oral rinse  7 mL Mouth Rinse BID  . aspirin EC  325 mg Oral Daily  . donepezil  10 mg Oral QHS  . folic acid  1 mg Oral Daily  . gabapentin  400 mg Oral TID  . insulin aspart  0-15 Units Subcutaneous TID WC  . multivitamin with minerals  1 tablet Oral Daily  . pantoprazole  40 mg Oral Daily  . polyethylene glycol  17 g Oral Daily  . simvastatin  20 mg Oral Daily  . sodium chloride  3 mL Intravenous Q12H  . tamsulosin  0.4 mg Oral Daily  . thiamine  100 mg Oral Daily  . venlafaxine XR  150 mg Oral Q breakfast   . lactated ringers    . sodium chloride irrigation      LABS: Basic Metabolic Panel:  Recent Labs  50/27/74 0446  NA 138  K 4.3  CL 105  CO2 27  GLUCOSE 115*  BUN 15  CREATININE 0.96  CALCIUM 8.6   Liver Function Tests: No results for input(s): AST, ALT, ALKPHOS, BILITOT, PROT, ALBUMIN in the last 72 hours. No results for input(s): LIPASE, AMYLASE in the last 72 hours. CBC:  Recent Labs  04/24/14 0446  WBC 6.2  HGB 10.2*  HCT 32.8*  MCV 94.3  PLT 152   Cardiac Enzymes: No results for input(s): CKTOTAL, CKMB, CKMBINDEX, TROPONINI in the last 72 hours. BNP: No results for input(s): PROBNP in the last 72 hours. D-Dimer: No  results for input(s): DDIMER in the last 72 hours. Hemoglobin A1C: No results for input(s): HGBA1C in the last 72 hours. Fasting Lipid Panel: No results for input(s): CHOL, HDL, LDLCALC, TRIG, CHOLHDL, LDLDIRECT in the last 72 hours. Thyroid Function Tests: No results for input(s): TSH, T4TOTAL, T3FREE, THYROIDAB in the last 72 hours.  Invalid input(s): FREET3   Radiology/Studies:  No results found.  PHYSICAL EXAM General: Well developed, well nourished, in no acute distress. Head: Normocephalic, atraumatic, dental work done earlier today with gauze packing.  Neck: Negative for carotid bruits. JVD not elevated. No adenopathy Lungs: Clear bilaterally to auscultation without wheezes, rales, or rhonchi. Breathing is unlabored. Heart: RRR S1 S2 with 3/6 murmur of severe AS Abdomen: Soft, non-tender, non-distended with normoactive bowel sounds. No hepatomegaly. No rebound/guarding. No obvious abdominal masses. Msk:  Strength and tone appears normal for age. Extremities: No clubbing, cyanosis or edema.  Distal pedal pulses are 2+ and equal bilaterally. Neuro: Alert and oriented X 3. Moves all extremities spontaneously. Psych:  Responds to questions appropriately with a normal affect.  ASSESSMENT AND PLAN: 74 year old male with severe aortic stenosis, symptomatic with syncopal episode.  1. Aortic stenosis-severe by echocardiogram. Awaiting possible surgery. Cardiothoracic surgery- Dr. Dorris FetchHendrickson has been present on this admission. Syncope with severe aortic stenosis increases likelihood of mortality over the next year if not sooner. S/p dental work/surgery today.  2. Atrial fibrillation-currently rate controlled. If heart rate becomes an issue, low-dose metoprolol may be helpful. Be careful with hypotension however.  3. Acute kidney injury-improved since admission. Current creatinine .96, previous 1.7  4. Syncope-severe aortic stenosis as culprit. Agree with stopping antihypertensives.  Hydration has helped as well.  5. Memory impairment-continue with current medication.  6. Hyperlipidemia-on low-dose simvastatin. Continue.  Awaiting surgery. We will follow along with you.   Present on Admission:  . Syncope  Signed, Rushawn Capshaw [place], MDFACC 04/24/2014 12:51 PM

## 2014-04-24 NOTE — Discharge Instructions (Signed)

## 2014-04-24 NOTE — Progress Notes (Signed)
He is asleep after dental extraction  I did no wake him  Plan AVR Friday 2/12. I will discuss possibility of a maze procedure with him tomorrow

## 2014-04-24 NOTE — Progress Notes (Signed)
OT Cancellation Note  Patient Details Name: Elly Modenahomas Patrick Raker MRN: [id number] DOB: [birth year]   Cancelled Treatment:    Reason Eval/Treat Not Completed: Medical issues which prohibited therapy (Pt recovering from extensive dental procedure.)  Evern BioMayberry, Kermit Arnette Lynn 04/24/2014, 2:13 PM

## 2014-04-25 LAB — URINALYSIS, ROUTINE W REFLEX MICROSCOPIC
Bilirubin Urine: NEGATIVE
GLUCOSE, UA: NEGATIVE mg/dL
HGB URINE DIPSTICK: NEGATIVE
Ketones, ur: NEGATIVE mg/dL
Leukocytes, UA: NEGATIVE
Nitrite: NEGATIVE
PROTEIN: NEGATIVE mg/dL
Specific Gravity, Urine: 1.01 (ref 1.005–1.030)
UROBILINOGEN UA: 4 mg/dL — AB (ref 0.0–1.0)
pH: 6 (ref 5.0–8.0)

## 2014-04-25 LAB — GLUCOSE, CAPILLARY
GLUCOSE-CAPILLARY: 108 mg/dL — AB (ref 70–99)
Glucose-Capillary: 124 mg/dL — ABNORMAL HIGH (ref 70–99)
Glucose-Capillary: 137 mg/dL — ABNORMAL HIGH (ref 70–99)
Glucose-Capillary: 160 mg/dL — ABNORMAL HIGH (ref 70–99)

## 2014-04-25 LAB — BASIC METABOLIC PANEL
Anion gap: 8 (ref 5–15)
BUN: 11 mg/dL (ref 6–23)
CALCIUM: 8.5 mg/dL (ref 8.4–10.5)
CO2: 27 mmol/L (ref 19–32)
CREATININE: 0.92 mg/dL (ref 0.50–1.35)
Chloride: 105 mmol/L (ref 96–112)
GFR, EST NON AFRICAN AMERICAN: 83 mL/min — AB (ref >90)
GLUCOSE: 111 mg/dL — AB (ref 70–99)
Potassium: 4.1 mmol/L (ref 3.5–5.1)
SODIUM: 140 mmol/L (ref 135–145)

## 2014-04-25 LAB — PROTIME-INR
INR: 1.09 (ref 0.00–1.49)
Prothrombin Time: 14.2 seconds (ref 11.6–15.2)

## 2014-04-25 LAB — ABO/RH: ABO/RH(D): O POS

## 2014-04-25 MED ORDER — DEXMEDETOMIDINE HCL IN NACL 400 MCG/100ML IV SOLN
0.1000 ug/kg/h | INTRAVENOUS | Status: AC
  Administered 2014-04-26: 0.3 ug/kg/h via INTRAVENOUS
  Filled 2014-04-25: qty 100

## 2014-04-25 MED ORDER — AMINOCAPROIC ACID 250 MG/ML IV SOLN
INTRAVENOUS | Status: AC
  Administered 2014-04-26: 69.8 mL/h via INTRAVENOUS
  Filled 2014-04-25: qty 40

## 2014-04-25 MED ORDER — POTASSIUM CHLORIDE 2 MEQ/ML IV SOLN
80.0000 meq | INTRAVENOUS | Status: DC
  Filled 2014-04-25: qty 40

## 2014-04-25 MED ORDER — SODIUM CHLORIDE 0.9 % IV SOLN
INTRAVENOUS | Status: DC
  Filled 2014-04-25: qty 30

## 2014-04-25 MED ORDER — DEXTROSE 5 % IV SOLN
0.0000 ug/min | INTRAVENOUS | Status: DC
  Filled 2014-04-25: qty 4

## 2014-04-25 MED ORDER — DEXTROSE 5 % IV SOLN
750.0000 mg | INTRAVENOUS | Status: DC
  Filled 2014-04-25 (×2): qty 750

## 2014-04-25 MED ORDER — DIAZEPAM 2 MG PO TABS
2.0000 mg | ORAL_TABLET | Freq: Once | ORAL | Status: AC
  Administered 2014-04-26: 2 mg via ORAL
  Filled 2014-04-25: qty 1

## 2014-04-25 MED ORDER — MAGNESIUM SULFATE 50 % IJ SOLN
40.0000 meq | INTRAMUSCULAR | Status: DC
  Filled 2014-04-25: qty 10

## 2014-04-25 MED ORDER — SODIUM CHLORIDE 0.9 % IV SOLN
INTRAVENOUS | Status: AC
  Administered 2014-04-26: 1.5 [IU]/h via INTRAVENOUS
  Filled 2014-04-25: qty 2.5

## 2014-04-25 MED ORDER — DEXTROSE 5 % IV SOLN
1.5000 g | INTRAVENOUS | Status: AC
  Administered 2014-04-26: .75 g via INTRAVENOUS
  Administered 2014-04-26: 1.5 g via INTRAVENOUS
  Filled 2014-04-25: qty 1.5

## 2014-04-25 MED ORDER — PLASMA-LYTE 148 IV SOLN
INTRAVENOUS | Status: DC
  Filled 2014-04-25: qty 2.5

## 2014-04-25 MED ORDER — VANCOMYCIN HCL 10 G IV SOLR
1500.0000 mg | INTRAVENOUS | Status: AC
  Administered 2014-04-26: 1500 mg via INTRAVENOUS
  Filled 2014-04-25: qty 1500

## 2014-04-25 MED ORDER — NITROGLYCERIN IN D5W 200-5 MCG/ML-% IV SOLN
2.0000 ug/min | INTRAVENOUS | Status: AC
  Administered 2014-04-26: 5 ug/min via INTRAVENOUS
  Filled 2014-04-25: qty 250

## 2014-04-25 MED ORDER — DEXTROSE 5 % IV SOLN
30.0000 ug/min | INTRAVENOUS | Status: DC
  Administered 2014-04-26: 20 ug/min via INTRAVENOUS
  Filled 2014-04-25: qty 2

## 2014-04-25 MED ORDER — CHLORHEXIDINE GLUCONATE CLOTH 2 % EX PADS
6.0000 | MEDICATED_PAD | Freq: Once | CUTANEOUS | Status: AC
  Administered 2014-04-26: 6 via TOPICAL

## 2014-04-25 MED ORDER — DOPAMINE-DEXTROSE 3.2-5 MG/ML-% IV SOLN
0.0000 ug/kg/min | INTRAVENOUS | Status: DC
  Administered 2014-04-26: 3 ug/kg/min via INTRAVENOUS
  Filled 2014-04-25: qty 250

## 2014-04-25 MED ORDER — ALPRAZOLAM 0.25 MG PO TABS: 0.2500 mg | ORAL_TABLET | ORAL | Status: DC | PRN

## 2014-04-25 MED ORDER — CHLORHEXIDINE GLUCONATE CLOTH 2 % EX PADS
6.0000 | MEDICATED_PAD | Freq: Once | CUTANEOUS | Status: AC
  Administered 2014-04-25: 6 via TOPICAL

## 2014-04-25 MED ORDER — BISACODYL 5 MG PO TBEC
5.0000 mg | DELAYED_RELEASE_TABLET | Freq: Once | ORAL | Status: AC
  Administered 2014-04-25: 5 mg via ORAL
  Filled 2014-04-25: qty 1

## 2014-04-25 MED ORDER — TEMAZEPAM 7.5 MG PO CAPS: 15.0000 mg | ORAL_CAPSULE | Freq: Once | ORAL | Status: AC | PRN

## 2014-04-25 MED ORDER — ENSURE COMPLETE PO LIQD
237.0000 mL | Freq: Three times a day (TID) | ORAL | Status: DC
  Administered 2014-04-25: 237 mL via ORAL

## 2014-04-25 MED ORDER — METOPROLOL TARTRATE 12.5 MG HALF TABLET
12.5000 mg | ORAL_TABLET | Freq: Once | ORAL | Status: AC
  Administered 2014-04-26: 12.5 mg via ORAL
  Filled 2014-04-25: qty 1

## 2014-04-25 NOTE — Progress Notes (Signed)
[facility]                                                                              Patient Demographics  Peter Weiss, is a 74 y.o. male, DOB - 09/05/1940, ZOX:[id number]  Admit date - 04/18/2014   Admitting Physician Yevonne Pax, MD  Outpatient Primary MD for the patient is Doss, Oleh Genin, NP  LOS - 7   Chief Complaint  Patient presents with  . Shortness of Breath      Admit HPI/Brief Narrative 74 y.o. male who presented after a syncopal episode. He was confused with his days. He stated his memory has been poor. Patient stated he was going to stand up and he passed out. He had no chest pain. He had noted dizziness when he stands up. He stated he had a recent cardiac cath which he thought was "a few days ago" but in reality he had it done in January. He was at [facility] when he had the initial cardiac event. Patient has a significant history of aortic stenosis. Patient was noted to have a low BP in the ED.   His inpatient w/u has revealed critical AoS. He is currently undergoing evaluation/preop planning for an AoVR. Patient was seen by dental medicine for extraction of multiple teeth before this procedure.  Assessment & Plan   Critical Aortic Stenosis -cardiac cath report from [facility] Jan 2016 suggests very tight valve / inability to cross w/ catheter  -TTE notes valve area of ~0.6cm2 and gradient of ~87mmHg -Cardiology and cardiothoracic surgery consulted and appreciated -Plan is for tissue AoVR on Friday, 04/26/2014  Syncope  -Likely secondary to aortic stenosis vs medications -PT and OT consulted  Multiple carious teeth  -Seen by dental medicine -patient had multiple teeth extractions 04/24/2014  Newly diagnosed but likely subacute or chronic Afib -Appears to date to at least January 2016 as is mentioned in an EKG from [facility] -agree w/ TCTS that he is not safe for anticoag given his apparent chronic cognitive impairment and high fall risk -Cardiology  consulted and appreciated, recommended low dose metoprolol if rate control becomes an issue  Hypotension w/ a hx of HTN -Due to [facility] + BP meds in the setting of critical AoS - BP has now normalized   Acute kidney injury  -Resolved with IVF  Modestly elevated temp -Resolved, has been afebrile for over 24hours -No leukocytosis, UA/CXR no infection  Hyperlipidemia -Continue statin  Diabetes Mellitus, type 2 -Continue ISS with CBG monitoring -Hemoglobin A1c 7  Dementia  -Appears to cary a diagnosis of dementia as he is on aricept chronically  -unclear extent of prior workup  Code Status: Full   Family Communication: None at bedside  Disposition Plan: Admitted.  Will likely have surgery on 04/26/2014  Time Spent in minutes   25 minutes  Procedures  Procedures: 2/5 carotid dopplers - 40-59% R ICA stenosis - 1-39% L ICA stenosis - vertebral flow antegrade  2/5 TTE - EF 55-60% - moderate LVH - severe AS (gradient )  Consults   Cardiology Cardiothoracic surgery Dental medicine  DVT Prophylaxis  SCDs  Lab Results  Component Value Date   PLT 163 04/24/2014  Medications  Scheduled Meds: . antiseptic oral rinse  7 mL Mouth Rinse BID  . aspirin EC  325 mg Oral Daily  . donepezil  10 mg Oral QHS  . feeding supplement (ENSURE COMPLETE)  237 mL Oral TID BM  . folic acid  1 mg Oral Daily  . gabapentin  400 mg Oral TID  . insulin aspart  0-15 Units Subcutaneous TID WC  . multivitamin with minerals  1 tablet Oral Daily  . pantoprazole  40 mg Oral Daily  . polyethylene glycol  17 g Oral Daily  . simvastatin  20 mg Oral Daily  . sodium chloride  3 mL Intravenous Q12H  . tamsulosin  0.4 mg Oral Daily  . thiamine  100 mg Oral Daily  . venlafaxine XR  150 mg Oral Q breakfast   Continuous Infusions: . lactated ringers 10 mL/hr at 04/24/14 1352  . sodium chloride irrigation     PRN Meds:.acetaminophen **OR** acetaminophen, morphine injection, ondansetron **OR**  ondansetron (ZOFRAN) IV, oxyCODONE  Antibiotics   Anti-infectives    Start     Dose/Rate Route Frequency Ordered Stop   04/24/14 0600  ceFAZolin (ANCEF) IVPB 2 g/50 mL premix     2 g 100 mL/hr over 30 Minutes Intravenous  Once 04/23/14 1402 04/24/14 0837   04/18/14 1830  cefTRIAXone (ROCEPHIN) 1 g in dextrose 5 % 50 mL IVPB  Status:  Discontinued     1 g 100 mL/hr over 30 Minutes Intravenous Every 24 hours 04/18/14 1830 04/19/14 1432        Subjective:   Peter Weiss seen and examined today.  Patient states he continues to have pain in his mouth.  Denies chest pain or SOB, abdominal pain.  Objective:   Filed Vitals:   04/24/14 1145 04/24/14 2009 04/25/14 0005 04/25/14 0356  BP: 135/81 135/88 107/70 136/74  Pulse: 78 89 88 92  Temp:  98.9 F (37.2 C) 98.8 F (37.1 C) 99.6 F (37.6 C)  TempSrc:  Oral Oral Oral  Resp: 14 16 15 14   Height:      Weight:    115.6 kg (254 lb 13.6 oz)  SpO2: 98% 95% 92% 95%    Wt Readings from Last 3 Encounters:  04/25/14 115.6 kg (254 lb 13.6 oz)  03/21/14 116.121 kg (256 lb)  09/28/13 116.234 kg (256 lb 4 oz)     Intake/Output Summary (Last 24 hours) at 04/25/14 1201 Last data filed at 04/25/14 1045  Gross per 24 hour  Intake 611.33 ml  Output   1600 ml  Net -988.67 ml    Exam  General: Well developed, well nourished, NAD, appears stated age  HEENT: NCAT,  mucous membranes moist.   Cardiovascular: S1 S2 auscultated, 3/6 SEM  Respiratory: Clear to auscultation bilaterally with equal chest rise  Abdomen: Soft, obese, nontender, nondistended, + bowel sounds  Extremities: warm dry without cyanosis clubbing or edema  Data Review   Micro Results Recent Results (from the past 240 hour(s))  Blood Culture (routine x 2)     Status: None   Collection Time: 04/18/14  3:20 PM  Result Value Ref Range Status   Specimen Description BLOOD LEFT ANTECUBITAL  Final   Special Requests BOTTLES DRAWN AEROBIC AND ANAEROBIC 10MLS  Final    Culture   Final    NO GROWTH 5 DAYS Performed at [facility]    Report Status 04/24/2014 FINAL  Final  Blood Culture (routine x 2)     Status: None  Collection Time: 04/18/14  3:30 PM  Result Value Ref Range Status   Specimen Description BLOOD LEFT ARM  Final   Special Requests BOTTLES DRAWN AEROBIC AND ANAEROBIC  Final   Culture   Final    NO GROWTH 5 DAYS Performed at [facility]    Report Status 04/24/2014 FINAL  Final  Urine culture     Status: None   Collection Time: 04/18/14  4:00 PM  Result Value Ref Range Status   Specimen Description URINE, CLEAN CATCH  Final   Special Requests NONE  Final   Colony Count   Final    6,000 COLONIES/ML Performed at [facility]    Culture   Final    INSIGNIFICANT GROWTH Performed at [facility]    Report Status 04/20/2014 FINAL  Final  MRSA PCR Screening     Status: None   Collection Time: 04/18/14  8:51 PM  Result Value Ref Range Status   MRSA by PCR NEGATIVE NEGATIVE Final    Comment:        The GeneXpert MRSA Assay (FDA approved for NASAL specimens only), is one component of a comprehensive MRSA colonization surveillance program. It is not intended to diagnose MRSA infection nor to guide or monitor treatment for MRSA infections.     Radiology Reports Dg Orthopantogram  04/22/2014   ADDENDUM REPORT: 04/22/2014 14:33  ADDENDUM: An additional view has been performed. The right mandible is better demonstrated. Left-sided dental caries are again demonstrated. No obvious bone abscess. The right mandible appears normal. No bone abscess. Most of the maxillary teeth are missing. The right maxillary teeth medially demonstrate dental caries.   Electronically Signed   By: Loralie Champagne M.D.   On: 04/22/2014 14:33   04/22/2014   CLINICAL DATA:  Dental caries.  EXAM: ORTHOPANTOGRAM/PANORAMIC  COMPARISON:  None.  FINDINGS: The mandibular condyles are normally located. No mandible fracture.  Left-sided dental caries are noted. The right-sided maxillary and mandibular teeth are not well seen due to blurring.  IMPRESSION: Left-sided dental caries.  Electronically Signed: By: Loralie Champagne M.D. On: 04/20/2014 22:44   Dg Chest 2 View  04/18/2014   CLINICAL DATA:  Shortness of breath for 1 day  EXAM: CHEST  2 VIEW  COMPARISON:  Chest x-ray of 03/21/2014  FINDINGS: No active infiltrate or effusion is seen. Cardiomegaly is stable. Mild eventration at the right lung base is unchanged. The bones are osteopenic.  IMPRESSION: Stable cardiomegaly.  No active lung disease.   Electronically Signed   By: Dwyane Dee M.D.   On: 04/18/2014 17:14   Nm Pulmonary Perf And Vent  04/18/2014   CLINICAL DATA:  Dyspnea, history hypertension, diabetes, heart murmur  EXAM: NUCLEAR MEDICINE VENTILATION - PERFUSION LUNG SCAN  TECHNIQUE: Ventilation images were obtained in multiple projections using inhaled aerosol technetium 99 M DTPA. Perfusion images were obtained in multiple projections after intravenous injection of Tc-75m MAA.  RADIOPHARMACEUTICALS:  40 mCi Tc-7m DTPA aerosol and 6 mCi Tc-35m MAA  COMPARISON:  None; correlation chest radiograph 04/18/2014  FINDINGS: Ventilation: Enlargement of cardiac silhouette.  Otherwise normal.  Perfusion: Enlargement of cardiac silhouette.  Otherwise normal.  Chest radiograph: Enlargement of cardiac silhouette  IMPRESSION: Cardiomegaly.  Otherwise normal ventilation and perfusion lung scan.   Electronically Signed   By: Ulyses Southward M.D.   On: 04/18/2014 19:55    CBC  Recent Labs Lab 04/18/14 1404 04/19/14 0750 04/20/14 0327 04/21/14 0405 04/24/14 0446 04/24/14 1600  WBC  8.0 5.9 7.7 5.9 6.2 8.9  HGB 11.8* 10.0* 9.7* 9.7* 10.2* 10.9*  HCT 38.2* 31.8* 30.4* 31.0* 32.8* 34.8*  PLT 203 182 159 138* 152 163  MCV 93.2 92.4 93.0 93.4 94.3 92.6  MCH 28.8 29.1 29.7 29.2 29.3 29.0  MCHC 30.9 31.4 31.9 31.3 31.1 31.3  RDW 13.7 13.6 13.6 13.5 13.6 13.6  LYMPHSABS 1.1  --    --   --   --   --   MONOABS 0.6  --   --   --   --   --   EOSABS 0.3  --   --   --   --   --   BASOSABS 0.0  --   --   --   --   --     Chemistries   Recent Labs Lab 04/18/14 1404 04/19/14 0750 04/20/14 0327 04/21/14 0405 04/24/14 0446 04/25/14 0519  NA 138 139 140 140 138 140  K 3.8 3.8 3.4* 4.2 4.3 4.1  CL 99 104 109 109 105 105  CO2 GLUCOSE 132* 127* 139* 128* 115* 111*  BUN 30* 34* 27* CREATININE 2.49* 1.69* 1.22 1.10 0.96 0.92  CALCIUM 9.5 8.5 8.5 8.5 8.6 8.5  AST 25 22  --   --   --   --   ALT 16 14  --   --   --   --   ALKPHOS 72 54  --   --   --   --   BILITOT 0.6 0.7  --   --   --   --    ------------------------------------------------------------------------------------------------------------------ estimated creatinine clearance is 95.2 mL/min (by C-G formula based on Cr of 0.92). ------------------------------------------------------------------------------------------------------------------ No results for input(s): HGBA1C in the last 72 hours. ------------------------------------------------------------------------------------------------------------------ No results for input(s): CHOL, HDL, LDLCALC, TRIG, CHOLHDL, LDLDIRECT in the last 72 hours. ------------------------------------------------------------------------------------------------------------------ No results for input(s): TSH, T4TOTAL, T3FREE, THYROIDAB in the last 72 hours.  Invalid input(s): FREET3 ------------------------------------------------------------------------------------------------------------------ No results for input(s): VITAMINB12, FOLATE, FERRITIN, TIBC, IRON, RETICCTPCT in the last 72 hours.  Coagulation profile  Recent Labs Lab 04/19/14 0750  INR 1.20    No results for input(s): DDIMER in the last 72 hours.  Cardiac Enzymes  Recent Labs Lab 04/18/14 2114 04/19/14 0327 04/19/14 0750  TROPONINI 0.06* 0.06* 0.05*    ------------------------------------------------------------------------------------------------------------------ Invalid input(s): POCBNP    Peter Weiss D.O. on 04/25/2014 at 12:01 PM  Between 7am to 7pm - Pager - [phone]  After 7pm go to [email or web address] - password [email or web address]  And look for the night coverage person covering for me after hours  [facility] Office  [phone]

## 2014-04-25 NOTE — Progress Notes (Signed)
Physical Therapy Treatment Patient Details Name: Peter Modenahomas Patrick Scales MRN: [id number] DOB: 1940-04-22 Today's Date: 04/25/2014    History of Present Illness Peter Weiss is a 74 y.o. male presents with a syncopal episode. He is a little confused with his days. He is not sure if he passed out today or yesterday but friends noted he had a syncopal episode today. He states that his memory has been poor. Patient states that he was going to stand up and he passed out. He had no chest pain. He states that he has noted dizziness when he stands up. He states that he had a recent cardiac cath which he thinks was a few days ago but in reality he had it done in January. He has noted some edema. He states he was [facility] when he had the initial cardiac event. Patient also has a significant history of aortic stenosis. Patient is on multiple medications for his blood pressure and has been noted to have a low BP in the ED.     PT Comments    Pt was seen for visit where he finally was more comfortable with L knee during gait.  He is up in chair when PT arrived and due to his difficulty with maneuvering at the beginning of PT will recommend HHPT to follow him and work on his L knee pain.  The limitation is creating some poor quality mobiltiy and may keep him from completely recovering independence.  Follow Up Recommendations  Home health PT;Supervision/Assistance - 24 hour     Equipment Recommendations  None recommended by PT    Recommendations for Other Services       Precautions / Restrictions Precautions Precautions: Fall Restrictions Weight Bearing Restrictions: No    Mobility  Bed Mobility               General bed mobility comments: Pt up in chair when PT arrived  Transfers Overall transfer level: Needs assistance Equipment used: Rolling walker (2 wheeled) Transfers: Sit to/from [employer];Stand Pivot Transfers Sit to Stand: Supervision Stand pivot transfers: Supervision        General transfer comment: cues for hand placmement and pt very careful in transition to walker  Ambulation/Gait Ambulation/Gait assistance: Min guard Ambulation Distance (Feet): 160 Feet Assistive device: Rolling walker (2 wheeled) Gait Pattern/deviations: Step-through pattern;Decreased stance time - left;Decreased dorsiflexion - left;Ataxic;Wide base of support;Trunk flexed Gait velocity: decreased Gait velocity interpretation: Below normal speed for age/gender General Gait Details: Pt was able to take controllled steps with RW and maintained some walking comfort with the support   Stairs            Wheelchair Mobility    Modified Rankin (Stroke Patients Only)       Balance Overall balance assessment: Needs assistance Sitting-balance support: Feet supported Sitting balance-Leahy Scale: Good   Postural control: Posterior lean Standing balance support: Bilateral upper extremity supported Standing balance-Leahy Scale: Fair                      Cognition Arousal/Alertness: Awake/alert Behavior During Therapy: WFL for tasks assessed/performed Overall Cognitive Status: No family/caregiver present to determine baseline cognitive functioning                      Exercises      General Comments        Pertinent Vitals/Pain Pain Assessment: Faces Pain Score: 4  Pain Location: L knee Pain Intervention(s): Monitored during session;Premedicated before session;Other (  comment) (used RW)    Home Living                      Prior Function            PT Goals (current goals can now be found in the care plan section) Acute Rehab PT Goals Patient Stated Goal: none stated Progress towards PT goals: Progressing toward goals    Frequency  Min 3X/week    PT Plan Current plan remains appropriate    Co-evaluation             End of Session Equipment Utilized During Treatment: Other (comment) (FWW) Activity Tolerance: Patient tolerated  treatment well Patient left: in chair;with call bell/phone within reach;with chair alarm set     Time: 1712-1730 PT Time Calculation (min) (ACUTE ONLY): 18 min  Charges:  $Gait Training: 8-22 mins                    G Codes:      Ivar Drape 05-16-2014, 5:58 PM   Samul Dada, PT MS Acute Rehab Dept. Number: [phone]

## 2014-04-25 NOTE — Progress Notes (Signed)
POST OPERATIVE NOTE:  04/25/2014 Peter Weiss [id number]  VITALS: BP 136/74 mmHg  Pulse 92  Temp(Src) 99.6 F (37.6 C) (Oral)  Resp 14  Ht 5\' 11"  (1.803 m)  Wt 254 lb 13.6 oz (115.6 kg)  BMI 35.56 kg/m2  SpO2 95%  LABS:  Lab Results  Component Value Date   WBC 8.9 04/24/2014   HGB 10.9* 04/24/2014   HCT 34.8* 04/24/2014   MCV 92.6 04/24/2014   PLT 163 04/24/2014   BMET    Component Value Date/Time   NA 140 04/25/2014 0519   K 4.1 04/25/2014 0519   CL 105 04/25/2014 0519   CO2 27 04/25/2014 0519   GLUCOSE 111* 04/25/2014 0519   BUN 11 04/25/2014 0519   CREATININE 0.92 04/25/2014 0519   CALCIUM 8.5 04/25/2014 0519   GFRNONAA 83* 04/25/2014 0519   GFRAA >90 04/25/2014 0519    Lab Results  Component Value Date   INR 1.20 04/19/2014   No results found for: PTT   Peter Weiss is status post multiple extractions with alveoloplasty and gross debridement of remaining dentition along with sectioning the bridge at the mesial of the abutment tooth #32 on 04/24/2014.  I informed the patient to be additional extractions of the upper right and upper left quadrants and lower left premolar #20. Patient expressed understanding and the reason for extraction.  SUBJECTIVE:  The patient has minimal complaints. Patient denies having any significant oral discomfort.  EXAM:  There is no sign of infection or bleeding. Sutures are intact. Clots are present. Some heme to the saliva is noted. Intra-Oral ecchymoses are noted. Some intraoral swelling is noted.   ASSESSMENT: Post operative course is consistent with dental procedures performed in the operating room.  PLAN: 1. Chlorhexidine gluconate oral rinses twice daily after breakfast and at bedtime to aid in disinfection of the oral cavity. 2. Salt water rinses every 2 hours while awake in between the chlorhexidine rinses as needed. 3. Advance diet as tolerated. 4. Proceed with aortic valve replacement with Dr.  Dorris FetchHendrickson as scheduled.  5. Follow-up with the primary dentist of his choice for exam, radiographs, and discussion of overall dental treatment needs once he is medically stable to proceed with dental treatment. 6. Patient will require antibiotic premedication prior to invasive dental procedures per American Heart Association guidelines after the aortic valve replacement.   Charlynne Panderonald F. Kulinski, DDS

## 2014-04-25 NOTE — Progress Notes (Signed)
INITIAL NUTRITION ASSESSMENT  DOCUMENTATION CODES Per approved criteria  -Obesity Unspecified   INTERVENTION:  Advance diet to soft diet. Patient thinks he is ready for soft solids today.  Ensure Complete PO TID, each supplement provides 350 kcal and 13 grams of protein  NUTRITION DIAGNOSIS: Inadequate oral intake related to difficulty chewing as evidenced by clear liquid diet.   Goal: Intake to meet >90% of estimated nutrition needs.  Monitor:  Diet advancement, PO intake, labs, weight trend.  Reason for Assessment: MD Consult   74 y.o. male  Admitting Dx: Syncope  ASSESSMENT: Patient presented to the hospital on 2/4 with a syncopal episode. Inpatient work-up revealed critical aortic stenosis. S/P multiple dental extractions with alveoloplasty on 2/10 in preparation for aortic valve replacement.  Per discussion with patient, he has been eating well up until his dental extractions yesterday. He does not like the clear liquids, he is ready for "real food." Also agreed to drink Ensure supplements between meals to increase protein and calorie intake.  Height: Ht Readings from Last 1 Encounters:  04/18/14  (1.803 m)    Weight: Wt Readings from Last 1 Encounters:  04/25/14 254 lb 13.6 oz (115.6 kg)    Ideal Body Weight: 78.2 kg  % Ideal Body Weight: 148%  Wt Readings from Last 10 Encounters:  04/25/14 254 lb 13.6 oz (115.6 kg)  03/21/14 256 lb (116.121 kg)  09/28/13 256 lb 4 oz (116.234 kg)    Usual Body Weight: 256 lbs  % Usual Body Weight: 99%  BMI:  Body mass index is 35.56 kg/(m^2). class 2 obesity  Estimated Nutritional Needs: Kcal: 2000-2200 Protein: 115-130 gm Fluid: 2-2.2 L  Skin: no issues  Diet Order: Diet clear liquid  EDUCATION NEEDS: -Education needs addressed   Intake/Output Summary (Last 24 hours) at 04/25/14 0856 Last data filed at 04/25/14 0300  Gross per 24 hour  Intake 1151.33 ml  Output   1100 ml  Net  51.33 ml     Last BM: 2/9   Labs:   Recent Labs Lab 04/21/14 0405 04/24/14 0446 04/25/14 0519  NA 140 138 140  K 4.2 4.3 4.1  CL 109 105 105  CO2 BUN CREATININE 1.10 0.96 0.92  CALCIUM 8.5 8.6 8.5  GLUCOSE 128* 115* 111*    CBG (last 3)   Recent Labs  04/24/14 1612 04/24/14 2115 04/25/14 0604  GLUCAP 143* 135* 108*    Scheduled Meds: . antiseptic oral rinse  7 mL Mouth Rinse BID  . aspirin EC  325 mg Oral Daily  . donepezil  10 mg Oral QHS  . folic acid  1 mg Oral Daily  . gabapentin  400 mg Oral TID  . insulin aspart  0-15 Units Subcutaneous TID WC  . multivitamin with minerals  1 tablet Oral Daily  . pantoprazole  40 mg Oral Daily  . polyethylene glycol  17 g Oral Daily  . simvastatin  20 mg Oral Daily  . sodium chloride  3 mL Intravenous Q12H  . tamsulosin  0.4 mg Oral Daily  . thiamine  100 mg Oral Daily  . venlafaxine XR  150 mg Oral Q breakfast    Continuous Infusions: . lactated ringers 10 mL/hr at 04/24/14 1352  . sodium chloride irrigation      Past Medical History  Diagnosis Date  . Arthritis   . Chicken pox   . Heart murmur   . Hypertension   . Hyperlipidemia   .  Urinary incontinence   . History of fainting spells of unknown cause   . Diabetes mellitus without complication   . Depression     Past Surgical History  Procedure Laterality Date  . Goiter  2008    Joaquin CourtsKimberly Eean Buss, RD, LDN, CNSC Pager [phone] After Hours Pager [phone]

## 2014-04-25 NOTE — Progress Notes (Signed)
1 Day Post-Op Procedure(s) (LRB): Extraction of tooth #'s 2,3,5,12,13,14,15,20 with allveoloplasty and gross debridement of remaining dentition and sectioning of bridge at mesial of abutment tooth #32. (N/A) Subjective: Some oral soreness No CP/ SOB  Objective: Vital signs in last 24 hours: Temp:  [98.8 F (37.1 C)-99.6 F (37.6 C)] 99.6 F (37.6 C) (02/11 0356) Pulse Rate:  [88-92] 92 (02/11 0356) Cardiac Rhythm:  [-] Atrial fibrillation (02/11 0825) Resp:  [14-16] 14 (02/11 0356) BP: (107-136)/(70-88) 136/74 mmHg (02/11 0356) SpO2:  [92 %-95 %] 95 % (02/11 0356) Weight:  [254 lb 13.6 oz (115.6 kg)] 254 lb 13.6 oz (115.6 kg) (02/11 0356)  Hemodynamic parameters for last 24 hours:    Intake/Output from previous day: 02/10 0701 - 02/11 0700 In: 1151.3 [P.O.:240; I.V.:911.3] Out: 1100 [Urine:1000; Blood:100] Intake/Output this shift: Total I/O In: 360 [P.O.:360] Out: 600 [Urine:600]  General appearance: alert and no distress Heart: irregularly irregular rhythm Lungs: diminished breath sounds bibasilar  Lab Results:  Recent Labs  04/24/14 0446 04/24/14 1600  WBC 6.2 8.9  HGB 10.2* 10.9*  HCT 32.8* 34.8*  PLT 152 163   BMET:  Recent Labs  04/24/14 0446 04/25/14 0519  NA 138 140  K 4.3 4.1  CL 105 105  CO2 27 27  GLUCOSE 115* 111*  BUN 15 11  CREATININE 0.96 0.92  CALCIUM 8.6 8.5    PT/INR: No results for input(s): LABPROT, INR in the last 72 hours. ABG    Component Value Date/Time   O2SAT 67.5 04/18/2014 1419   CBG (last 3)   Recent Labs  04/24/14 2115 04/25/14 0604 04/25/14 1136  GLUCAP 135* 108* 124*    Assessment/Plan: S/P Procedure(s) (LRB): Extraction of tooth #'s 2,3,5,12,13,14,15,20 with allveoloplasty and gross debridement of remaining dentition and sectioning of bridge at mesial of abutment tooth #32. (N/A) -  Critical AS- for AVR tomorrow  He remains in atrial fibrillation- I discussed doing a maze procedure at the time of AVR.  He understands this has about an 85% chance of relieving a fib. Does increase risk of having heart block and needing a pacemaker. He is aware of the risks as I discussed previously with him. We reviewed the risks of death, MI, stroke, bleeding, possible need for transfusion, infection, respiratory or renal failure, as well as the possibility of unforeseeable complications.  For AVR, maze tomorrow   LOS: 7 days    Peter Weiss C 04/25/2014

## 2014-04-25 NOTE — Progress Notes (Signed)
CARDIAC REHAB PHASE I   PRE:  Rate/Rhythm: 84 Afib  BP:  Supine:   Sitting: 120/87  Standing:    SaO2: 96 RA  MODE:  Ambulation: 180 ft   POST:  Rate/Rhythm: 82  BP:  Supine:   Sitting: 121/79  Standing:    SaO2: 97 RA 1205-1250 Assisted X 1 and used walker to ambulate. Gait steady with walker.Pt c/o of knee pain walking and leg discomfort that he has had for a year.He admits to not walking much prior to coming to the hospital due to his back and leg issues Completed pre-op surgery education with pt. I gave him education booklet and pt care guide. I placed going for heart surgery video on for him to watch. I discussed with him that he will need someone to stay with him 24/7 at discharge for the first week. He has very limited support and he is not sure if he will have someone. We did discuss rehab as a possibility. We will follow pt post-op as ordered.  Melina CopaLisa Malaky Tetrault RN 04/25/2014 12:57 PM

## 2014-04-25 NOTE — Progress Notes (Signed)
[facility].

## 2014-04-25 NOTE — Progress Notes (Signed)
Pt removed CPAP and requested he did not want to wear it.  Pt's O2 when checked was 85%, applied 3L [place] and pt's O2 went up to 95%.  Will continue to monitor closely.  Erenest Rasheraldwell,Treasure Ingrum B, RN

## 2014-04-25 NOTE — Progress Notes (Signed)
Occupational Therapy Treatment Patient Details Name: Peter Weiss MRN: [id number] DOB: 08-04-1940 Today's Date: 04/25/2014    History of present illness Peter Weiss is a 74 y.o. male presents with a syncopal episode. He is a little confused with his days. He is not sure if he passed out today or yesterday but friends noted he had a syncopal episode today. He states that his memory has been poor. Patient states that he was going to stand up and he passed out. He had no chest pain. He states that he has noted dizziness when he stands up. He states that he had a recent cardiac cath which he thinks was a few days ago but in reality he had it done in January. He has noted some edema. He states he was at [facility] when he had the initial cardiac event. Patient also has a significant history of aortic stenosis. Patient is on multiple medications for his blood pressure and has been noted to have a low BP in the ED.    OT comments  Pt performing toileting, LB dressing and grooming with supervision, but no physical assist. Much more stable with use of RW vs his cane. Instructed in multiple uses of 3 in 1 for home use.  Follow Up Recommendations  Home health OT    Equipment Recommendations  3 in 1 bedside comode (if pt is agreeable)    Recommendations for Other Services      Precautions / Restrictions Precautions Precautions: Fall       Mobility Bed Mobility               General bed mobility comments: pt received in chair  Transfers Overall transfer level: Needs assistance Equipment used: Rolling walker (2 wheeled) Transfers: Sit to/from Stand Sit to Stand: Supervision         General transfer comment: extra time and effort required, heavy reliance on UEs, verbal cues for hand placement    Balance             Standing balance-Leahy Scale: Fair                     ADL Overall ADL's : Needs assistance/impaired     Grooming:  Supervision/safety;Wash/dry hands               Lower Body Dressing: Sit to/from stand;Supervision/safety   Toilet Transfer: Ambulation;Regular Toilet;Grab bars;Supervision/safety;RW   Toileting- ArchitectClothing Manipulation and Hygiene: Sit to/from Oncologiststand;Supervision/safety     Tub/Shower Transfer Details (indicate cue type and reason): pt stated his shower at home would not accommodate a seat Functional mobility during ADLs: Lobbyistolling walker;Supervision/safety        Vision                     Perception     Praxis      Cognition   Behavior During Therapy: [facility] for tasks assessed/performed Overall Cognitive Status: No family/caregiver present to determine baseline cognitive functioning       Memory: Decreased short-term memory               Extremity/Trunk Assessment               Exercises     Shoulder Instructions       General Comments      Pertinent Vitals/ Pain       Pain Assessment: Faces Faces Pain Scale: Hurts a little bit Pain Location: L knee Pain Descriptors / Indicators:  Aching Pain Intervention(s): Monitored during session;Repositioned  Home Living                                          Prior Functioning/Environment              Frequency Min 2X/week     Progress Toward Goals  OT Goals(current goals can now be found in the care plan section)  Progress towards OT goals: Progressing toward goals  Acute Rehab OT Goals Patient Stated Goal: none stated  Plan Discharge plan remains appropriate    Co-evaluation                 End of Session Equipment Utilized During Treatment: Gait belt;Rolling walker   Activity Tolerance Patient tolerated treatment well   Patient Left in chair;with call bell/phone within reach (eating lunch)   Nurse Communication          Time: 7846-9629 OT Time Calculation (min): 16 min  Charges: OT General Charges $OT Visit: 1 Procedure OT Treatments $Self  Care/Home Management : 8-22 mins  Evern Bio 04/25/2014, 1:55 PM  [phone]

## 2014-04-25 NOTE — Progress Notes (Signed)
TELEMETRY: Reviewed telemetry pt in Atrial fibrillation with rate control   Filed Vitals:   04/24/14 1145 04/24/14 2009 04/25/14 0005 04/25/14 0356  BP: 135/81 135/88 107/70 136/74  Pulse: 78 89 88 92  Temp:  98.9 F (37.2 C) 98.8 F (37.1 C) 99.6 F (37.6 C)  TempSrc:  Oral Oral Oral  Resp: 14 16 15 14   Height:      Weight:    254 lb 13.6 oz (115.6 kg)  SpO2: 98% 95% 92% 95%    Intake/Output Summary (Last 24 hours) at 04/25/14 1013 Last data filed at 04/25/14 0300  Gross per 24 hour  Intake 251.33 ml  Output   1000 ml  Net -748.67 ml   Filed Weights   04/22/14 0500 04/24/14 0519 04/25/14 0356  Weight: 260 lb 2.3 oz (118 kg) 256 lb 11.2 oz (116.438 kg) 254 lb 13.6 oz (115.6 kg)      Subjective Feeling just okay. Does have some SOB. Eager to get procedure done and leave hospital.     . antiseptic oral rinse  7 mL Mouth Rinse BID  . aspirin EC  325 mg Oral Daily  . donepezil  10 mg Oral QHS  . folic acid  1 mg Oral Daily  . gabapentin  400 mg Oral TID  . insulin aspart  0-15 Units Subcutaneous TID WC  . multivitamin with minerals  1 tablet Oral Daily  . pantoprazole  40 mg Oral Daily  . polyethylene glycol  17 g Oral Daily  . simvastatin  20 mg Oral Daily  . sodium chloride  3 mL Intravenous Q12H  . tamsulosin  0.4 mg Oral Daily  . thiamine  100 mg Oral Daily  . venlafaxine XR  150 mg Oral Q breakfast   . lactated ringers 10 mL/hr at 04/24/14 1352  . sodium chloride irrigation      LABS: Basic Metabolic Panel:  Recent Labs  16/12/9600/10/16 0446 04/25/14 0519  NA 138 140  K 4.3 4.1  CL 105 105  CO2 27 27  GLUCOSE 115* 111*  BUN 15 11  CREATININE 0.96 0.92  CALCIUM 8.6 8.5   CBC:  Recent Labs  04/24/14 0446 04/24/14 1600  WBC 6.2 8.9  HGB 10.2* 10.9*  HCT 32.8* 34.8*  MCV 94.3 92.6  PLT 152 163     Radiology/Studies:  No results found.  PHYSICAL EXAM General: Well developed, well nourished, in no acute distress. Head: Normocephalic,  atraumatic, dental work done earlier today with gauze packing.  Neck: Negative for carotid bruits. JVD not elevated. No adenopathy Lungs: Clear bilaterally to auscultation without wheezes, rales, or rhonchi. Breathing is unlabored. Heart: irreg irreg. S1 S2 with 3/6 murmur of severe AS Abdomen: Soft, non-tender, non-distended with normoactive bowel sounds. No hepatomegaly. No rebound/guarding. No obvious abdominal masses. Msk:  Strength and tone appears normal for age. Extremities: No clubbing, cyanosis or edema.  Distal pedal pulses are 2+ and equal bilaterally. Neuro: Alert and oriented X 3. Moves all extremities spontaneously. Psych:  Responds to questions appropriately with a normal affect.  ASSESSMENT AND PLAN: 74 year old male with severe aortic stenosis, symptomatic with syncopal episode.  1. Aortic stenosis-severe by echocardiogram. Awaiting possible surgery. Cardiothoracic surgery- Dr. Dorris FetchHendrickson has been present on this admission. Syncope with severe aortic stenosis increases likelihood of mortality over the next year if not sooner. S/p dental work/surgery today.  2. Atrial fibrillation-currently rate controlled. If heart rate becomes an issue, low-dose metoprolol may be helpful. Be careful with  hypotension however.  3. Acute kidney injury-improved since admission. Current creatinine .92, previous 1.7  4. Syncope-severe aortic stenosis as culprit. Agree with stopping antihypertensives. Hydration has helped as well.  5. Memory impairment-continue with current medication.  6. Hyperlipidemia-on low-dose simvastatin. Continue.  Awaiting surgery. Planned for tomorrow +/- MAZE procedure. We will follow along with you.   Present on Admission:  . Syncope  Signed, [name], PA-C 04/25/2014 10:13 AM   Patient seen and examined and history reviewed. Agree with above findings and plan. Doing well today. No cardiac complaints. VSS. Awaiting AVR and MAZE tomorrow.    Peter Weiss, MDFACC 04/25/2014 1:02 PM

## 2014-04-26 ENCOUNTER — Encounter (HOSPITAL_COMMUNITY)
Admission: EM | Disposition: A | Discharge status: Home Health | Payer: Medicare Other | Source: Home / Self Care | Attending: Thoracic Surgery (Cardiothoracic Vascular Surgery)

## 2014-04-26 ENCOUNTER — Inpatient Hospital Stay (HOSPITAL_COMMUNITY): Payer: Medicare Other | Admitting: Anesthesiology

## 2014-04-26 ENCOUNTER — Inpatient Hospital Stay (HOSPITAL_COMMUNITY): Payer: Medicare Other

## 2014-04-26 ENCOUNTER — Encounter (HOSPITAL_COMMUNITY): Payer: Self-pay | Admitting: Dentistry

## 2014-04-26 DIAGNOSIS — I481 Persistent atrial fibrillation: Secondary | ICD-10-CM

## 2014-04-26 DIAGNOSIS — Z952 Presence of prosthetic heart valve: Secondary | ICD-10-CM

## 2014-04-26 DIAGNOSIS — I351 Nonrheumatic aortic (valve) insufficiency: Secondary | ICD-10-CM

## 2014-04-26 HISTORY — PX: AORTIC VALVE REPLACEMENT: SHX41

## 2014-04-26 HISTORY — PX: MAZE: SHX5063

## 2014-04-26 HISTORY — PX: TEE WITHOUT CARDIOVERSION: SHX5443

## 2014-04-26 LAB — GLUCOSE, CAPILLARY
GLUCOSE-CAPILLARY: 60 mg/dL — AB (ref 70–99)
GLUCOSE-CAPILLARY: 104 mg/dL — AB (ref 70–99)
GLUCOSE-CAPILLARY: 151 mg/dL — AB (ref 70–99)
Glucose-Capillary: 122 mg/dL — ABNORMAL HIGH (ref 70–99)
Glucose-Capillary: 83 mg/dL (ref 70–99)
Glucose-Capillary: 121 mg/dL — ABNORMAL HIGH (ref 70–99)
Glucose-Capillary: 154 mg/dL — ABNORMAL HIGH (ref 70–99)

## 2014-04-26 LAB — POCT I-STAT, CHEM 8
BUN: 14 mg/dL (ref 6–23)
BUN: 12 mg/dL (ref 6–23)
BUN: 11 mg/dL (ref 6–23)
BUN: 12 mg/dL (ref 6–23)
BUN: 12 mg/dL (ref 6–23)
BUN: 15 mg/dL (ref 6–23)
BUN: 14 mg/dL (ref 6–23)
BUN: 13 mg/dL (ref 6–23)
CALCIUM ION: 1.15 mmol/L (ref 1.13–1.30)
CALCIUM ION: 1.11 mmol/L — AB (ref 1.13–1.30)
CALCIUM ION: 1.28 mmol/L (ref 1.13–1.30)
CHLORIDE: 100 mmol/L (ref 96–112)
CREATININE: 0.8 mg/dL (ref 0.50–1.35)
CREATININE: 0.7 mg/dL (ref 0.50–1.35)
CREATININE: 0.6 mg/dL (ref 0.50–1.35)
Calcium, Ion: 1.21 mmol/L (ref 1.13–1.30)
Calcium, Ion: 1.03 mmol/L — ABNORMAL LOW (ref 1.13–1.30)
Calcium, Ion: 1.09 mmol/L — ABNORMAL LOW (ref 1.13–1.30)
Calcium, Ion: 1.1 mmol/L — ABNORMAL LOW (ref 1.13–1.30)
Calcium, Ion: 1.24 mmol/L (ref 1.13–1.30)
Chloride: 102 mmol/L (ref 96–112)
Chloride: 102 mmol/L (ref 96–112)
Chloride: 99 mmol/L (ref 96–112)
Chloride: 102 mmol/L (ref 96–112)
Chloride: 102 mmol/L (ref 96–112)
Chloride: 104 mmol/L (ref 96–112)
Chloride: 105 mmol/L (ref 96–112)
Creatinine, Ser: 0.8 mg/dL (ref 0.50–1.35)
Creatinine, Ser: 0.7 mg/dL (ref 0.50–1.35)
Creatinine, Ser: 0.7 mg/dL (ref 0.50–1.35)
Creatinine, Ser: 0.8 mg/dL (ref 0.50–1.35)
Creatinine, Ser: 0.7 mg/dL (ref 0.50–1.35)
GLUCOSE: 109 mg/dL — AB (ref 70–99)
GLUCOSE: 164 mg/dL — AB (ref 70–99)
Glucose, Bld: 110 mg/dL — ABNORMAL HIGH (ref 70–99)
Glucose, Bld: 132 mg/dL — ABNORMAL HIGH (ref 70–99)
Glucose, Bld: 96 mg/dL (ref 70–99)
Glucose, Bld: 126 mg/dL — ABNORMAL HIGH (ref 70–99)
Glucose, Bld: 160 mg/dL — ABNORMAL HIGH (ref 70–99)
Glucose, Bld: 152 mg/dL — ABNORMAL HIGH (ref 70–99)
HCT: 29 % — ABNORMAL LOW (ref 39.0–52.0)
HCT: 25 % — ABNORMAL LOW (ref 39.0–52.0)
HCT: 20 % — ABNORMAL LOW (ref 39.0–52.0)
HCT: 26 % — ABNORMAL LOW (ref 39.0–52.0)
HCT: 27 % — ABNORMAL LOW (ref 39.0–52.0)
HCT: 26 % — ABNORMAL LOW (ref 39.0–52.0)
HCT: 32 % — ABNORMAL LOW (ref 39.0–52.0)
HEMATOCRIT: 27 % — AB (ref 39.0–52.0)
HEMOGLOBIN: 9.9 g/dL — AB (ref 13.0–17.0)
HEMOGLOBIN: 8.5 g/dL — AB (ref 13.0–17.0)
HEMOGLOBIN: 8.8 g/dL — AB (ref 13.0–17.0)
Hemoglobin: 6.8 g/dL — CL (ref 13.0–17.0)
Hemoglobin: 8.8 g/dL — ABNORMAL LOW (ref 13.0–17.0)
Hemoglobin: 9.2 g/dL — ABNORMAL LOW (ref 13.0–17.0)
Hemoglobin: 9.2 g/dL — ABNORMAL LOW (ref 13.0–17.0)
Hemoglobin: 10.9 g/dL — ABNORMAL LOW (ref 13.0–17.0)
POTASSIUM: 4 mmol/L (ref 3.5–5.1)
Potassium: 3.7 mmol/L (ref 3.5–5.1)
Potassium: 3.3 mmol/L — ABNORMAL LOW (ref 3.5–5.1)
Potassium: 4.5 mmol/L (ref 3.5–5.1)
Potassium: 4.6 mmol/L (ref 3.5–5.1)
Potassium: 4.9 mmol/L (ref 3.5–5.1)
Potassium: 4 mmol/L (ref 3.5–5.1)
Potassium: 4.1 mmol/L (ref 3.5–5.1)
SODIUM: 140 mmol/L (ref 135–145)
SODIUM: 140 mmol/L (ref 135–145)
Sodium: 141 mmol/L (ref 135–145)
Sodium: 138 mmol/L (ref 135–145)
Sodium: 139 mmol/L (ref 135–145)
Sodium: 140 mmol/L (ref 135–145)
Sodium: 140 mmol/L (ref 135–145)
Sodium: 140 mmol/L (ref 135–145)
TCO2: 27 mmol/L (ref 0–100)
TCO2: 24 mmol/L (ref 0–100)
TCO2: 25 mmol/L (ref 0–100)
TCO2: 26 mmol/L (ref 0–100)
TCO2: 26 mmol/L (ref 0–100)
TCO2: 25 mmol/L (ref 0–100)
TCO2: 23 mmol/L (ref 0–100)
TCO2: 21 mmol/L (ref 0–100)

## 2014-04-26 LAB — CBC
HCT: 31.2 % — ABNORMAL LOW (ref 39.0–52.0)
HCT: 31.9 % — ABNORMAL LOW (ref 39.0–52.0)
HEMATOCRIT: 36.3 % — AB (ref 39.0–52.0)
HEMOGLOBIN: 11.9 g/dL — AB (ref 13.0–17.0)
HEMOGLOBIN: 10.7 g/dL — AB (ref 13.0–17.0)
Hemoglobin: 9.8 g/dL — ABNORMAL LOW (ref 13.0–17.0)
MCH: 29.5 pg (ref 26.0–34.0)
MCH: 29.8 pg (ref 26.0–34.0)
MCH: 29.7 pg (ref 26.0–34.0)
MCHC: 31.4 g/dL (ref 30.0–36.0)
MCHC: 32.8 g/dL (ref 30.0–36.0)
MCHC: 33.5 g/dL (ref 30.0–36.0)
MCV: 94 fL (ref 78.0–100.0)
MCV: 91 fL (ref 78.0–100.0)
MCV: 88.6 fL (ref 78.0–100.0)
PLATELETS: 171 10*3/uL (ref 150–400)
PLATELETS: 101 10*3/uL — AB (ref 150–400)
Platelets: 102 10*3/uL — ABNORMAL LOW (ref 150–400)
RBC: 3.32 MIL/uL — ABNORMAL LOW (ref 4.22–5.81)
RBC: 3.99 MIL/uL — ABNORMAL LOW (ref 4.22–5.81)
RBC: 3.6 MIL/uL — AB (ref 4.22–5.81)
RDW: 13.5 % (ref 11.5–15.5)
RDW: 13.6 % (ref 11.5–15.5)
RDW: 13.8 % (ref 11.5–15.5)
WBC: 5.6 10*3/uL (ref 4.0–10.5)
WBC: 11.7 10*3/uL — AB (ref 4.0–10.5)
WBC: 11.8 10*3/uL — ABNORMAL HIGH (ref 4.0–10.5)

## 2014-04-26 LAB — POCT I-STAT 4, (NA,K, GLUC, HGB,HCT)
Glucose, Bld: 122 mg/dL — ABNORMAL HIGH (ref 70–99)
HCT: 35 % — ABNORMAL LOW (ref 39.0–52.0)
Hemoglobin: 11.9 g/dL — ABNORMAL LOW (ref 13.0–17.0)
Potassium: 4.1 mmol/L (ref 3.5–5.1)
Sodium: 140 mmol/L (ref 135–145)

## 2014-04-26 LAB — POCT I-STAT 3, ART BLOOD GAS (G3+)
ACID-BASE EXCESS: 2 mmol/L (ref 0.0–2.0)
Acid-Base Excess: 2 mmol/L (ref 0.0–2.0)
Acid-Base Excess: 1 mmol/L (ref 0.0–2.0)
BICARBONATE: 27.1 meq/L — AB (ref 20.0–24.0)
Bicarbonate: 25.7 mEq/L — ABNORMAL HIGH (ref 20.0–24.0)
Bicarbonate: 24.9 meq/L — ABNORMAL HIGH (ref 20.0–24.0)
O2 SAT: 100 %
O2 Saturation: 100 %
O2 Saturation: 91 %
PCO2 ART: 33.7 mmHg — AB (ref 35.0–45.0)
PO2 ART: 373 mmHg — AB (ref 80.0–100.0)
Patient temperature: 37
Patient temperature: 38
TCO2: 27 mmol/L (ref 0–100)
TCO2: 28 mmol/L (ref 0–100)
TCO2: 26 mmol/L (ref 0–100)
pCO2 arterial: 44 mmHg (ref 35.0–45.0)
pCO2 arterial: 38.9 mmHg (ref 35.0–45.0)
pH, Arterial: 7.49 — ABNORMAL HIGH (ref 7.350–7.450)
pH, Arterial: 7.397 (ref 7.350–7.450)
pH, Arterial: 7.418 (ref 7.350–7.450)
pO2, Arterial: 315 mmHg — ABNORMAL HIGH (ref 80.0–100.0)
pO2, Arterial: 62 mmHg — ABNORMAL LOW (ref 80.0–100.0)

## 2014-04-26 LAB — PREPARE RBC (CROSSMATCH)

## 2014-04-26 LAB — BASIC METABOLIC PANEL
Anion gap: 7 (ref 5–15)
BUN: 14 mg/dL (ref 6–23)
CALCIUM: 8.5 mg/dL (ref 8.4–10.5)
CO2: 27 mmol/L (ref 19–32)
Chloride: 105 mmol/L (ref 96–112)
Creatinine, Ser: 0.89 mg/dL (ref 0.50–1.35)
GFR, EST NON AFRICAN AMERICAN: 84 mL/min — AB (ref >90)
GLUCOSE: 167 mg/dL — AB (ref 70–99)
POTASSIUM: 3.8 mmol/L (ref 3.5–5.1)
SODIUM: 139 mmol/L (ref 135–145)

## 2014-04-26 LAB — PROTIME-INR
INR: 1.46 (ref 0.00–1.49)
PROTHROMBIN TIME: 17.8 s — AB (ref 11.6–15.2)

## 2014-04-26 LAB — SURGICAL PCR SCREEN
MRSA, PCR: NEGATIVE
STAPHYLOCOCCUS AUREUS: NEGATIVE

## 2014-04-26 LAB — HEMOGLOBIN AND HEMATOCRIT, BLOOD
HEMATOCRIT: 26.6 % — AB (ref 39.0–52.0)
HEMOGLOBIN: 8.7 g/dL — AB (ref 13.0–17.0)

## 2014-04-26 LAB — PLATELET COUNT: PLATELETS: 107 10*3/uL — AB (ref 150–400)

## 2014-04-26 LAB — CREATININE, SERUM
Creatinine, Ser: 0.82 mg/dL (ref 0.50–1.35)
GFR calc non Af Amer: 87 mL/min — ABNORMAL LOW (ref >90)

## 2014-04-26 LAB — MAGNESIUM: Magnesium: 2.7 mg/dL — ABNORMAL HIGH (ref 1.5–2.5)

## 2014-04-26 LAB — APTT: APTT: 43 s — AB (ref 24–37)

## 2014-04-26 SURGERY — REPLACEMENT, AORTIC VALVE, OPEN
Anesthesia: General | Site: Chest

## 2014-04-26 MED ORDER — BISACODYL 10 MG RE SUPP: 10.0000 mg | Freq: Every day | RECTAL | Status: DC

## 2014-04-26 MED ORDER — HEPARIN SODIUM (PORCINE) 1000 UNIT/ML IJ SOLN
INTRAMUSCULAR | Status: DC | PRN
  Administered 2014-04-26: 37000 [IU] via INTRAVENOUS

## 2014-04-26 MED ORDER — NITROGLYCERIN IN D5W 200-5 MCG/ML-% IV SOLN
0.0000 ug/min | INTRAVENOUS | Status: DC
  Administered 2014-04-26: 5 ug/min via INTRAVENOUS

## 2014-04-26 MED ORDER — BISACODYL 5 MG PO TBEC
10.0000 mg | DELAYED_RELEASE_TABLET | Freq: Every day | ORAL | Status: DC
Start: 2014-04-27 — End: 2014-04-28
  Administered 2014-04-27: 10 mg via ORAL
  Filled 2014-04-26: qty 2

## 2014-04-26 MED ORDER — FENTANYL CITRATE 0.05 MG/ML IJ SOLN
INTRAMUSCULAR | Status: AC
  Filled 2014-04-26: qty 5

## 2014-04-26 MED ORDER — ALBUMIN HUMAN 5 % IV SOLN
250.0000 mL | INTRAVENOUS | Status: AC | PRN
  Administered 2014-04-26 (×3): 250 mL via INTRAVENOUS
  Filled 2014-04-26: qty 250

## 2014-04-26 MED ORDER — DOPAMINE-DEXTROSE 3.2-5 MG/ML-% IV SOLN
0.0000 ug/kg/min | INTRAVENOUS | Status: DC
  Administered 2014-04-26: 3 ug/kg/min via INTRAVENOUS

## 2014-04-26 MED ORDER — VANCOMYCIN HCL IN DEXTROSE 1-5 GM/200ML-% IV SOLN
1000.0000 mg | Freq: Once | INTRAVENOUS | Status: AC
  Administered 2014-04-26: 1000 mg via INTRAVENOUS
  Filled 2014-04-26: qty 200

## 2014-04-26 MED ORDER — LACTATED RINGERS IV SOLN: 500.0000 mL | Freq: Once | INTRAVENOUS | Status: AC | PRN

## 2014-04-26 MED ORDER — MIDAZOLAM HCL 10 MG/2ML IJ SOLN
INTRAMUSCULAR | Status: AC
  Filled 2014-04-26: qty 2

## 2014-04-26 MED ORDER — ROCURONIUM BROMIDE 50 MG/5ML IV SOLN
INTRAVENOUS | Status: AC
  Filled 2014-04-26: qty 1

## 2014-04-26 MED ORDER — OXYCODONE HCL 5 MG PO TABS
5.0000 mg | ORAL_TABLET | ORAL | Status: DC | PRN
  Administered 2014-04-27 – 2014-04-28 (×5): 10 mg via ORAL
  Filled 2014-04-26 (×4): qty 2
  Filled 2014-04-26: qty 1
  Filled 2014-04-26 (×2): qty 2

## 2014-04-26 MED ORDER — METOCLOPRAMIDE HCL 5 MG/ML IJ SOLN
10.0000 mg | Freq: Four times a day (QID) | INTRAMUSCULAR | Status: AC
  Administered 2014-04-26 – 2014-04-27 (×4): 10 mg via INTRAVENOUS
  Filled 2014-04-26 (×4): qty 2

## 2014-04-26 MED ORDER — SODIUM CHLORIDE 0.9 % IV SOLN
INTRAVENOUS | Status: DC
  Administered 2014-04-26: 20 mL/h via INTRAVENOUS

## 2014-04-26 MED ORDER — LACTATED RINGERS IV SOLN
INTRAVENOUS | Status: DC | PRN
  Administered 2014-04-26: 07:00:00 via INTRAVENOUS

## 2014-04-26 MED ORDER — DEXTROSE 50 % IV SOLN
INTRAVENOUS | Status: AC
  Administered 2014-04-26: 16 mL via INTRAVENOUS
  Filled 2014-04-26: qty 50

## 2014-04-26 MED ORDER — SODIUM CHLORIDE 0.9 % IJ SOLN: 3.0000 mL | INTRAMUSCULAR | Status: DC | PRN

## 2014-04-26 MED ORDER — METOPROLOL TARTRATE 12.5 MG HALF TABLET
12.5000 mg | ORAL_TABLET | Freq: Two times a day (BID) | ORAL | Status: DC
  Filled 2014-04-26 (×3): qty 1

## 2014-04-26 MED ORDER — SODIUM CHLORIDE 0.9 % IJ SOLN
INTRAMUSCULAR | Status: AC
  Filled 2014-04-26: qty 20

## 2014-04-26 MED ORDER — SUCCINYLCHOLINE CHLORIDE 20 MG/ML IJ SOLN
INTRAMUSCULAR | Status: DC | PRN
  Administered 2014-04-26: 60 mg via INTRAVENOUS

## 2014-04-26 MED ORDER — ACETAMINOPHEN 650 MG RE SUPP
650.0000 mg | Freq: Once | RECTAL | Status: AC
  Administered 2014-04-26: 650 mg via RECTAL

## 2014-04-26 MED ORDER — SUCCINYLCHOLINE CHLORIDE 20 MG/ML IJ SOLN
INTRAMUSCULAR | Status: AC
  Filled 2014-04-26: qty 1

## 2014-04-26 MED ORDER — CALCIUM CHLORIDE 10 % IV SOLN
INTRAVENOUS | Status: AC
  Filled 2014-04-26: qty 10

## 2014-04-26 MED ORDER — DEXMEDETOMIDINE HCL IN NACL 200 MCG/50ML IV SOLN
0.0000 ug/kg/h | INTRAVENOUS | Status: DC
  Administered 2014-04-26: 0.7 ug/kg/h via INTRAVENOUS
  Administered 2014-04-26: 0.2 ug/kg/h via INTRAVENOUS
  Filled 2014-04-26 (×2): qty 50

## 2014-04-26 MED ORDER — ALBUMIN HUMAN 5 % IV SOLN
INTRAVENOUS | Status: DC | PRN
  Administered 2014-04-26: 13:00:00 via INTRAVENOUS

## 2014-04-26 MED ORDER — SODIUM CHLORIDE 0.9 % IV SOLN
INTRAVENOUS | Status: DC
  Administered 2014-04-26: 3 [IU]/h via INTRAVENOUS
  Administered 2014-04-26: 1.4 [IU]/h via INTRAVENOUS
  Filled 2014-04-26 (×2): qty 2.5

## 2014-04-26 MED ORDER — ASPIRIN 81 MG PO CHEW
324.0000 mg | CHEWABLE_TABLET | Freq: Every day | ORAL | Status: DC
Start: 2014-04-27 — End: 2014-04-28

## 2014-04-26 MED ORDER — CALCIUM CHLORIDE 10 % IV SOLN
INTRAVENOUS | Status: DC | PRN
  Administered 2014-04-26: 200 mg via INTRAVENOUS

## 2014-04-26 MED ORDER — ACETAMINOPHEN 160 MG/5ML PO SOLN
1000.0000 mg | Freq: Four times a day (QID) | ORAL | Status: DC
  Filled 2014-04-26: qty 40

## 2014-04-26 MED ORDER — MIDAZOLAM HCL 5 MG/5ML IJ SOLN
INTRAMUSCULAR | Status: DC | PRN
  Administered 2014-04-26: 2 mg via INTRAVENOUS
  Administered 2014-04-26: 3 mg via INTRAVENOUS
  Administered 2014-04-26: 1 mg via INTRAVENOUS
  Administered 2014-04-26 (×2): 2 mg via INTRAVENOUS

## 2014-04-26 MED ORDER — ACETAMINOPHEN 500 MG PO TABS
1000.0000 mg | ORAL_TABLET | Freq: Four times a day (QID) | ORAL | Status: DC
Start: 2014-04-27 — End: 2014-04-28
  Administered 2014-04-27 – 2014-04-28 (×4): 1000 mg via ORAL
  Filled 2014-04-26 (×8): qty 2

## 2014-04-26 MED ORDER — CHLORHEXIDINE GLUCONATE 0.12 % MT SOLN
15.0000 mL | Freq: Two times a day (BID) | OROMUCOSAL | Status: DC
  Administered 2014-04-26 – 2014-04-27 (×2): 15 mL via OROMUCOSAL
  Filled 2014-04-26 (×2): qty 15

## 2014-04-26 MED ORDER — PROTAMINE SULFATE 10 MG/ML IV SOLN
INTRAVENOUS | Status: AC
Start: 2014-04-26 — End: 2014-04-26
  Filled 2014-04-26: qty 25

## 2014-04-26 MED ORDER — POTASSIUM CHLORIDE 10 MEQ/50ML IV SOLN: 10.0000 meq | INTRAVENOUS | Status: AC

## 2014-04-26 MED ORDER — SODIUM CHLORIDE 0.9 % IJ SOLN
OROMUCOSAL | Status: DC | PRN
  Administered 2014-04-26: 4 mL via TOPICAL

## 2014-04-26 MED ORDER — ROCURONIUM BROMIDE 100 MG/10ML IV SOLN
INTRAVENOUS | Status: DC | PRN
  Administered 2014-04-26: 50 mg via INTRAVENOUS
  Administered 2014-04-26: 20 mg via INTRAVENOUS
  Administered 2014-04-26: 30 mg via INTRAVENOUS
  Administered 2014-04-26: 60 mg via INTRAVENOUS
  Administered 2014-04-26: 50 mg via INTRAVENOUS
  Administered 2014-04-26: 40 mg via INTRAVENOUS

## 2014-04-26 MED ORDER — TRAMADOL HCL 50 MG PO TABS: 50.0000 mg | ORAL_TABLET | ORAL | Status: DC | PRN

## 2014-04-26 MED ORDER — PHENYLEPHRINE HCL 10 MG/ML IJ SOLN
0.0000 ug/min | INTRAVENOUS | Status: DC
  Administered 2014-04-26: 20 ug/min via INTRAVENOUS
  Filled 2014-04-26 (×2): qty 2

## 2014-04-26 MED ORDER — MORPHINE SULFATE 2 MG/ML IJ SOLN
1.0000 mg | INTRAMUSCULAR | Status: AC | PRN
  Filled 2014-04-26: qty 1

## 2014-04-26 MED ORDER — METOPROLOL TARTRATE 25 MG/10 ML ORAL SUSPENSION
12.5000 mg | Freq: Two times a day (BID) | ORAL | Status: DC
Start: 2014-04-26 — End: 2014-04-27
  Filled 2014-04-26 (×3): qty 5

## 2014-04-26 MED ORDER — LIDOCAINE HCL (CARDIAC) 20 MG/ML IV SOLN
INTRAVENOUS | Status: AC
  Filled 2014-04-26: qty 5

## 2014-04-26 MED ORDER — PANTOPRAZOLE SODIUM 40 MG PO TBEC
40.0000 mg | DELAYED_RELEASE_TABLET | Freq: Every day | ORAL | Status: DC
  Administered 2014-04-27 – 2014-04-28 (×2): 40 mg via ORAL
  Filled 2014-04-26 (×2): qty 1

## 2014-04-26 MED ORDER — PLASMA-LYTE 148 IV SOLN
INTRAVENOUS | Status: DC | PRN
  Administered 2014-04-26: 500 mL via INTRAVASCULAR

## 2014-04-26 MED ORDER — MAGNESIUM SULFATE 4 GM/100ML IV SOLN
4.0000 g | Freq: Once | INTRAVENOUS | Status: AC
  Administered 2014-04-26: 4 g via INTRAVENOUS
  Filled 2014-04-26: qty 100

## 2014-04-26 MED ORDER — ROCURONIUM BROMIDE 50 MG/5ML IV SOLN
INTRAVENOUS | Status: AC
  Filled 2014-04-26: qty 2

## 2014-04-26 MED ORDER — HEPARIN SODIUM (PORCINE) 1000 UNIT/ML IJ SOLN
INTRAMUSCULAR | Status: AC
  Filled 2014-04-26: qty 1

## 2014-04-26 MED ORDER — PROPOFOL 10 MG/ML IV BOLUS
INTRAVENOUS | Status: AC
  Filled 2014-04-26: qty 20

## 2014-04-26 MED ORDER — DEXTROSE 50 % IV SOLN
16.0000 mL | Freq: Once | INTRAVENOUS | Status: AC
  Administered 2014-04-26: 16 mL via INTRAVENOUS

## 2014-04-26 MED ORDER — DOCUSATE SODIUM 100 MG PO CAPS
200.0000 mg | ORAL_CAPSULE | Freq: Every day | ORAL | Status: DC
  Administered 2014-04-27: 200 mg via ORAL
  Filled 2014-04-26: qty 2

## 2014-04-26 MED ORDER — PHENYLEPHRINE 40 MCG/ML (10ML) SYRINGE FOR IV PUSH (FOR BLOOD PRESSURE SUPPORT)
PREFILLED_SYRINGE | INTRAVENOUS | Status: AC
  Filled 2014-04-26: qty 10

## 2014-04-26 MED ORDER — PROPOFOL 10 MG/ML IV BOLUS
INTRAVENOUS | Status: DC | PRN
  Administered 2014-04-26: 3 mg via INTRAVENOUS
  Administered 2014-04-26: 50 mg via INTRAVENOUS

## 2014-04-26 MED ORDER — FENTANYL CITRATE 0.05 MG/ML IJ SOLN
INTRAMUSCULAR | Status: DC | PRN
  Administered 2014-04-26: 100 ug via INTRAVENOUS
  Administered 2014-04-26 (×4): 250 ug via INTRAVENOUS
  Administered 2014-04-26: 150 ug via INTRAVENOUS
  Administered 2014-04-26: 250 ug via INTRAVENOUS

## 2014-04-26 MED ORDER — SODIUM CHLORIDE 0.9 % IV SOLN: 10.0000 mL/h | Freq: Once | INTRAVENOUS | Status: DC

## 2014-04-26 MED ORDER — ARTIFICIAL TEARS OP OINT
TOPICAL_OINTMENT | OPHTHALMIC | Status: AC
  Filled 2014-04-26: qty 3.5

## 2014-04-26 MED ORDER — SODIUM CHLORIDE 0.9 % IJ SOLN
3.0000 mL | Freq: Two times a day (BID) | INTRAMUSCULAR | Status: DC
  Administered 2014-04-27 – 2014-04-28 (×3): 3 mL via INTRAVENOUS

## 2014-04-26 MED ORDER — MORPHINE SULFATE 2 MG/ML IJ SOLN
2.0000 mg | INTRAMUSCULAR | Status: DC | PRN
  Administered 2014-04-26 – 2014-04-27 (×4): 4 mg via INTRAVENOUS
  Administered 2014-04-27: 2 mg via INTRAVENOUS
  Filled 2014-04-26 (×3): qty 2
  Filled 2014-04-26: qty 1
  Filled 2014-04-26: qty 2

## 2014-04-26 MED ORDER — DEXTROSE 5 % IV SOLN
1.5000 g | Freq: Two times a day (BID) | INTRAVENOUS | Status: AC
  Administered 2014-04-26 – 2014-04-28 (×4): 1.5 g via INTRAVENOUS
  Filled 2014-04-26 (×4): qty 1.5

## 2014-04-26 MED ORDER — FAMOTIDINE IN NACL 20-0.9 MG/50ML-% IV SOLN
20.0000 mg | Freq: Two times a day (BID) | INTRAVENOUS | Status: AC
  Administered 2014-04-26 (×2): 20 mg via INTRAVENOUS
  Filled 2014-04-26: qty 50

## 2014-04-26 MED ORDER — ACETAMINOPHEN 160 MG/5ML PO SOLN: 650.0000 mg | Freq: Once | ORAL | Status: AC

## 2014-04-26 MED ORDER — SODIUM CHLORIDE 0.45 % IV SOLN
INTRAVENOUS | Status: DC
  Administered 2014-04-26: 20 mL/h via INTRAVENOUS

## 2014-04-26 MED ORDER — MIDAZOLAM HCL 2 MG/2ML IJ SOLN
2.0000 mg | INTRAMUSCULAR | Status: DC | PRN
  Administered 2014-04-27 (×2): 2 mg via INTRAVENOUS
  Filled 2014-04-26 (×2): qty 2

## 2014-04-26 MED ORDER — ONDANSETRON HCL 4 MG/2ML IJ SOLN: 4.0000 mg | Freq: Four times a day (QID) | INTRAMUSCULAR | Status: DC | PRN

## 2014-04-26 MED ORDER — CETYLPYRIDINIUM CHLORIDE 0.05 % MT LIQD
7.0000 mL | Freq: Four times a day (QID) | OROMUCOSAL | Status: DC
  Administered 2014-04-26 – 2014-04-27 (×5): 7 mL via OROMUCOSAL

## 2014-04-26 MED ORDER — INSULIN REGULAR BOLUS VIA INFUSION
0.0000 [IU] | Freq: Three times a day (TID) | INTRAVENOUS | Status: DC
  Filled 2014-04-26: qty 10

## 2014-04-26 MED ORDER — LACTATED RINGERS IV SOLN
INTRAVENOUS | Status: DC
  Administered 2014-04-26: 20 mL/h via INTRAVENOUS

## 2014-04-26 MED ORDER — ASPIRIN EC 325 MG PO TBEC
325.0000 mg | DELAYED_RELEASE_TABLET | Freq: Every day | ORAL | Status: DC
  Administered 2014-04-27 – 2014-04-28 (×2): 325 mg via ORAL
  Filled 2014-04-26 (×2): qty 1

## 2014-04-26 MED ORDER — PROTAMINE SULFATE 10 MG/ML IV SOLN
INTRAVENOUS | Status: DC | PRN
  Administered 2014-04-26: 20 mg via INTRAVENOUS
  Administered 2014-04-26: 300 mg via INTRAVENOUS

## 2014-04-26 MED ORDER — METOPROLOL TARTRATE 1 MG/ML IV SOLN: 2.5000 mg | INTRAVENOUS | Status: DC | PRN

## 2014-04-26 MED ORDER — HEMOSTATIC AGENTS (NO CHARGE) OPTIME
TOPICAL | Status: DC | PRN
  Administered 2014-04-26: 1 via TOPICAL

## 2014-04-26 MED ORDER — SODIUM CHLORIDE 0.9 % IV SOLN: 250.0000 mL | INTRAVENOUS | Status: DC

## 2014-04-26 MED ORDER — DEXMEDETOMIDINE HCL IN NACL 400 MCG/100ML IV SOLN
0.1000 ug/kg/h | INTRAVENOUS | Status: DC
Start: 2014-04-26 — End: 2014-04-26
  Filled 2014-04-26: qty 100

## 2014-04-26 MED ORDER — SODIUM CHLORIDE 0.9 % IJ SOLN
INTRAMUSCULAR | Status: DC | PRN
  Administered 2014-04-26: 4 mL via TOPICAL

## 2014-04-26 MED FILL — Sodium Chloride IV Soln 0.9%: INTRAVENOUS | Qty: 3000 | Status: AC

## 2014-04-26 MED FILL — Sodium Bicarbonate IV Soln 8.4%: INTRAVENOUS | Qty: 50 | Status: AC

## 2014-04-26 MED FILL — Heparin Sodium (Porcine) Inj 1000 Unit/ML: INTRAMUSCULAR | Qty: 10 | Status: AC

## 2014-04-26 MED FILL — Mannitol IV Soln 20%: INTRAVENOUS | Qty: 500 | Status: AC

## 2014-04-26 MED FILL — Lidocaine HCl IV Inj 20 MG/ML: INTRAVENOUS | Qty: 5 | Status: AC

## 2014-04-26 MED FILL — Electrolyte-R (PH 7.4) Solution: INTRAVENOUS | Qty: 5000 | Status: AC

## 2014-04-26 SURGICAL SUPPLY — 91 items
ADAPTER CARDIO PERF ANTE/RETRO (ADAPTER) ×8 IMPLANT
APPLICATOR COTTON TIP 6IN STRL (MISCELLANEOUS) IMPLANT
ATRICLIP EXCLUSION 40 STD HAND (Clip) ×4 IMPLANT
ATTRACTOMAT 16X20 MAGNETIC DRP (DRAPES) ×4 IMPLANT
BAG DECANTER FOR FLEXI CONT (MISCELLANEOUS) ×4 IMPLANT
BLADE STERNUM SYSTEM 6 (BLADE) ×4 IMPLANT
BLADE SURG 11 STRL SS (BLADE) ×4 IMPLANT
BLADE SURG 15 STRL LF DISP TIS (BLADE) ×2 IMPLANT
BLADE SURG 15 STRL SS (BLADE) ×2
CANISTER SUCTION 2500CC (MISCELLANEOUS) ×4 IMPLANT
CANN PRFSN 3/8XRT ANG TPR 14 (MISCELLANEOUS) ×2
CANNULA EZ GLIDE 8.0 24FR (CANNULA) ×4 IMPLANT
CANNULA EZ GLIDE AORTIC 21FR (CANNULA) ×4 IMPLANT
CANNULA GUNDRY RCSP 15FR (MISCELLANEOUS) ×4 IMPLANT
CANNULA MALLEABLE SINGLE 40FR (CANNULA) ×4 IMPLANT
CANNULA PRFSN 3/8XRT ANG TPR14 (MISCELLANEOUS) ×2 IMPLANT
CANNULA SUMP PERICARDIAL (CANNULA) ×4 IMPLANT
CANNULA VEN MTL TIP RT (MISCELLANEOUS) ×2
CARDIOBLATE CARDIAC ABLATION (MISCELLANEOUS) ×4
CATH CPB KIT HENDRICKSON (MISCELLANEOUS) ×4 IMPLANT
CATH HEART VENT LEFT (CATHETERS) ×2 IMPLANT
CATH ROBINSON RED A/P 18FR (CATHETERS) ×12 IMPLANT
CATH THORACIC 36FR (CATHETERS) ×8 IMPLANT
CATH THORACIC 36FR RT ANG (CATHETERS) ×8 IMPLANT
CLIP FOGARTY SPRING 6M (CLIP) IMPLANT
CONN 1/2X1/2X1/2  BEN (MISCELLANEOUS) ×2
CONN 1/2X1/2X1/2 BEN (MISCELLANEOUS) ×2 IMPLANT
CONN 3/8X1/2 ST GISH (MISCELLANEOUS) ×8 IMPLANT
COVER SURGICAL LIGHT HANDLE (MISCELLANEOUS) ×4 IMPLANT
CRADLE DONUT ADULT HEAD (MISCELLANEOUS) ×4 IMPLANT
DEVICE CARDIOBLATE CARDIAC ABL (MISCELLANEOUS) ×2 IMPLANT
DRAPE SLUSH/WARMER DISC (DRAPES) ×4 IMPLANT
DRSG COVADERM 4X14 (GAUZE/BANDAGES/DRESSINGS) ×4 IMPLANT
ELECT REM PT RETURN 9FT ADLT (ELECTROSURGICAL) ×8
ELECTRODE REM PT RTRN 9FT ADLT (ELECTROSURGICAL) ×4 IMPLANT
GAUZE SPONGE 4X4 12PLY STRL (GAUZE/BANDAGES/DRESSINGS) ×4 IMPLANT
GLOVE SURG SIGNA 7.5 PF LTX (GLOVE) ×12 IMPLANT
GOWN STRL REUS W/ TWL LRG LVL3 (GOWN DISPOSABLE) ×8 IMPLANT
GOWN STRL REUS W/ TWL XL LVL3 (GOWN DISPOSABLE) ×2 IMPLANT
GOWN STRL REUS W/TWL LRG LVL3 (GOWN DISPOSABLE) ×8
GOWN STRL REUS W/TWL XL LVL3 (GOWN DISPOSABLE) ×2
HEMOSTAT POWDER SURGIFOAM 1G (HEMOSTASIS) ×12 IMPLANT
HEMOSTAT SURGICEL 2X14 (HEMOSTASIS) ×4 IMPLANT
INSERT FOGARTY XLG (MISCELLANEOUS) ×4 IMPLANT
KIT BASIN OR (CUSTOM PROCEDURE TRAY) ×4 IMPLANT
KIT ROOM TURNOVER OR (KITS) ×4 IMPLANT
KIT SUCTION CATH 14FR (SUCTIONS) ×8 IMPLANT
LEAD PACING MYOCARDI (MISCELLANEOUS) ×4 IMPLANT
LINE VENT (MISCELLANEOUS) ×4 IMPLANT
LOOP VESSEL SUPERMAXI WHITE (MISCELLANEOUS) ×4 IMPLANT
NS IRRIG 1000ML POUR BTL (IV SOLUTION) ×20 IMPLANT
PACK OPEN HEART (CUSTOM PROCEDURE TRAY) ×4 IMPLANT
PAD ARMBOARD 7.5X6 YLW CONV (MISCELLANEOUS) ×8 IMPLANT
PROBE CRYO2-ABLATION MALLABLE (MISCELLANEOUS) IMPLANT
SET CARDIOPLEGIA MPS 5001102 (MISCELLANEOUS) ×4 IMPLANT
SUT BONE WAX W31G (SUTURE) ×4 IMPLANT
SUT ETHIBON 2 0 V 52N 30 (SUTURE) ×8 IMPLANT
SUT ETHIBON EXCEL 2-0 V-5 (SUTURE) IMPLANT
SUT ETHIBOND 2 0 SH (SUTURE) ×6
SUT ETHIBOND 2 0 SH 36X2 (SUTURE) ×6 IMPLANT
SUT ETHIBOND 2 0 V4 (SUTURE) IMPLANT
SUT ETHIBOND 2 0V4 GREEN (SUTURE) IMPLANT
SUT ETHIBOND 4 0 RB 1 (SUTURE) IMPLANT
SUT ETHIBOND V-5 VALVE (SUTURE) IMPLANT
SUT PROLENE 3 0 SH 1 (SUTURE) IMPLANT
SUT PROLENE 3 0 SH DA (SUTURE) ×4 IMPLANT
SUT PROLENE 4 0 RB 1 (SUTURE) ×22
SUT PROLENE 4 0 SH DA (SUTURE) ×32 IMPLANT
SUT PROLENE 4-0 RB1 .5 CRCL 36 (SUTURE) ×22 IMPLANT
SUT SILK  1 MH (SUTURE) ×2
SUT SILK 1 MH (SUTURE) ×2 IMPLANT
SUT SILK 1 TIES 10X30 (SUTURE) ×4 IMPLANT
SUT STEEL 6MS V (SUTURE) IMPLANT
SUT STEEL SZ 6 DBL 3X14 BALL (SUTURE) IMPLANT
SUT VIC AB 1 CTX 36 (SUTURE) ×4
SUT VIC AB 1 CTX36XBRD ANBCTR (SUTURE) ×4 IMPLANT
SUT VIC AB 2-0 CTX 27 (SUTURE) IMPLANT
SUT VIC AB 3-0 X1 27 (SUTURE) IMPLANT
SUTURE E-PAK OPEN HEART (SUTURE) ×4 IMPLANT
SYR 10ML KIT SKIN ADHESIVE (MISCELLANEOUS) IMPLANT
SYS ATRICLIP LAA EXCLUSION 40 (Clip) ×4 IMPLANT
SYS ATRICLIP LAA EXCLUSION 45 (CLIP) IMPLANT
SYSTEM SAHARA CHEST DRAIN ATS (WOUND CARE) ×4 IMPLANT
TAPE CLOTH SURG 4X10 WHT LF (GAUZE/BANDAGES/DRESSINGS) ×4 IMPLANT
TOWEL OR 17X24 6PK STRL BLUE (TOWEL DISPOSABLE) ×8 IMPLANT
TOWEL OR 17X26 10 PK STRL BLUE (TOWEL DISPOSABLE) ×8 IMPLANT
TRAY FOLEY IC TEMP SENS 16FR (CATHETERS) ×4 IMPLANT
UNDERPAD 30X30 INCONTINENT (UNDERPADS AND DIAPERS) ×4 IMPLANT
VALVE CARP ED PERICARD 27MM (Prosthesis & Implant Heart) ×4 IMPLANT
VENT LEFT HEART 12002 (CATHETERS) ×4
WATER STERILE IRR 1000ML POUR (IV SOLUTION) ×8 IMPLANT

## 2014-04-26 NOTE — Interval H&P Note (Signed)
History and Physical Interval Note: Teeth extracted, for AVR maze today 04/26/2014 7:17 AM  Peter Weiss  has presented today for surgery, with the diagnosis of SEVERE AS  The various methods of treatment have been discussed with the patient and family. After consideration of risks, benefits and other options for treatment, the patient has consented to  Procedure(s): AORTIC VALVE REPLACEMENT (AVR) (N/A) POSSIBLE MAZE (N/A) TRANSESOPHAGEAL ECHOCARDIOGRAM (TEE) (N/A) as a surgical intervention .  The patient's history has been reviewed, patient examined, no change in status, stable for surgery.  I have reviewed the patient's chart and labs.  Questions were answered to the patient's satisfaction.     [name] C

## 2014-04-26 NOTE — Progress Notes (Signed)
UR Completed.  [phone]  

## 2014-04-26 NOTE — Progress Notes (Signed)
Patient failed wean PCO2:61

## 2014-04-26 NOTE — Anesthesia Procedure Notes (Addendum)
Procedure Name: Intubation Date/Time: 04/26/2014 8:13 AM Performed by: Coralee RudFLORES, Emanuelle Hammerstrom Pre-anesthesia Checklist: Patient identified, Emergency Drugs available, Suction available and Patient being monitored Patient Re-evaluated:Patient Re-evaluated prior to inductionOxygen Delivery Method: Circle system utilized Preoxygenation: Pre-oxygenation with 100% oxygen Intubation Type: IV induction Ventilation: Two handed mask ventilation required and Mask ventilation without difficulty Laryngoscope Size: Miller and 3 Grade View: Grade I Tube type: Oral Tube size: 8.0 mm Number of attempts: 1 Airway Equipment and Method: Stylet Placement Confirmation: ETT inserted through vocal cords under direct vision,  positive ETCO2 and breath sounds checked- equal and bilateral Secured at: 22 cm Tube secured with: Tape Dental Injury: Teeth and Oropharynx as per pre-operative assessment

## 2014-04-26 NOTE — Anesthesia Postprocedure Evaluation (Signed)
  Anesthesia Post-op Note  Patient: Peter Weiss  Procedure(s) Performed: Procedure(s): AORTIC VALVE REPLACEMENT (AVR) (N/A) MAZE (N/A) TRANSESOPHAGEAL ECHOCARDIOGRAM (TEE) (N/A)  Patient Location: PACU  Anesthesia Type:General  Level of Consciousness: sedated  Airway and Oxygen Therapy: Patient remains intubated per anesthesia plan  Post-op Pain: none  Post-op Assessment: Post-op Vital signs reviewed, Patient's Cardiovascular Status Stable, Respiratory Function Stable, Patent Airway, No signs of Nausea or vomiting and Pain level controlled  Post-op Vital Signs: Reviewed and stable  Last Vitals:  Filed Vitals:   04/26/14 1433  BP:   Pulse: 88  Temp: 37 C  Resp: 12    Complications: No apparent anesthesia complications

## 2014-04-26 NOTE — Transfer of Care (Signed)
Immediate Anesthesia Transfer of Care Note  Patient: Peter Weiss  Procedure(s) Performed: Procedure(s): AORTIC VALVE REPLACEMENT (AVR) (N/A) MAZE (N/A) TRANSESOPHAGEAL ECHOCARDIOGRAM (TEE) (N/A)  Patient Location: SICU  Anesthesia Type:General  Level of Consciousness: Patient remains intubated per anesthesia plan  Airway & Oxygen Therapy: Patient remains intubated per anesthesia plan and Patient placed on Ventilator (see vital sign flow sheet for setting)  Post-op Assessment: Report given to RN  Post vital signs: Reviewed and stable  Last Vitals:  Filed Vitals:   04/26/14 0449  BP: 145/74  Pulse: 90  Temp: 37.1 C  Resp: 18    Complications: No apparent anesthesia complications

## 2014-04-26 NOTE — H&P (View-Only) (Signed)
Reason for Consult:Aortic stenosis Referring Physician: Dr. Thereasa Solo, Dr. Gollan(Cardiology)  Peter Weiss is an 74 y.o. male.  HPI: 74 yo man admitted after a syncopal spell  Peter Weiss is a 74 yo man with multiple medical problems, including known AS. He has memory loss and is a poor historian. Apparently he has been having issues with falls for some time. He "blacked out" on the day of admission. He was brought to the ED where he was noted to altered mental status, was febrile to 100.4, and was dehydrated. Troponin was elevated. He was also noted to be in atrial fibrillation.  He had an echo which showed critical aortic stenosis. He had catheterization in January which showed no significant coronary artery disease.  Past Medical History  Diagnosis Date  . Arthritis   . Chicken pox   . Heart murmur   . Hypertension   . Hyperlipidemia   . Urinary incontinence   . History of fainting spells of unknown cause   . Diabetes mellitus without complication   . Depression     Past Surgical History  Procedure Laterality Date  . Goiter  2008    Family History  Problem Relation Age of Onset  . Alcohol abuse Mother   . Heart disease Mother   . Diabetes Mother   . Heart disease Father   . Diabetes Father   . Cancer Mother   . Cancer Father   . Alcohol abuse Father   . Alcohol abuse Brother     Social History:  reports that he has never smoked. He has never used smokeless tobacco. He reports that he does not drink alcohol or use illicit drugs.  Allergies: No Known Allergies  Medications:  Scheduled: . antiseptic oral rinse  7 mL Mouth Rinse BID  . aspirin EC  325 mg Oral Daily  . donepezil  10 mg Oral QHS  . folic acid  1 mg Oral Daily  . gabapentin  400 mg Oral TID  . heparin  5,000 Units Subcutaneous 3 times per day  . [START ON 04/21/2014] Influenza vac split quadrivalent PF  0.5 mL Intramuscular Tomorrow-1000  . insulin aspart  0-15 Units Subcutaneous TID WC  .  multivitamin with minerals  1 tablet Oral Daily  . pantoprazole  40 mg Oral Daily  . [START ON 04/21/2014] pneumococcal 23 valent vaccine  0.5 mL Intramuscular Tomorrow-1000  . polyethylene glycol  17 g Oral Daily  . potassium chloride  40 mEq Oral Once  . simvastatin  20 mg Oral Daily  . sodium chloride  3 mL Intravenous Q12H  . tamsulosin  0.4 mg Oral Daily  . thiamine  100 mg Oral Daily  . venlafaxine XR  150 mg Oral Q breakfast    Results for orders placed or performed during the hospital encounter of 04/18/14 (from the past 48 hour(s))  Blood Culture (routine x 2)     Status: None (Preliminary result)   Collection Time: 04/18/14  3:20 PM  Result Value Ref Range   Specimen Description BLOOD LEFT ANTECUBITAL    Special Requests BOTTLES DRAWN AEROBIC AND ANAEROBIC 10MLS    Culture             BLOOD CULTURE RECEIVED NO GROWTH TO DATE CULTURE WILL BE HELD FOR 5 DAYS BEFORE ISSUING A FINAL NEGATIVE REPORT Performed at [facility]    Report Status PENDING   Ammonia     Status: None   Collection Time: 04/18/14  3:22 PM  Result Value Ref Range   Ammonia 16 11 - 32 umol/L  Blood Culture (routine x 2)     Status: None (Preliminary result)   Collection Time: 04/18/14  3:30 PM  Result Value Ref Range   Specimen Description BLOOD LEFT ARM    Special Requests BOTTLES DRAWN AEROBIC AND ANAEROBIC 10MLS    Culture             BLOOD CULTURE RECEIVED NO GROWTH TO DATE CULTURE WILL BE HELD FOR 5 DAYS BEFORE ISSUING A FINAL NEGATIVE REPORT Performed at [facility]    Report Status PENDING   Ethanol     Status: None   Collection Time: 04/18/14  3:30 PM  Result Value Ref Range   Alcohol, Ethyl (B) <5 0 - 9 mg/dL    Comment:        LOWEST DETECTABLE LIMIT FOR SERUM ALCOHOL IS 11 mg/dL FOR MEDICAL PURPOSES ONLY   Urinalysis, Routine w reflex microscopic     Status: Abnormal   Collection Time: 04/18/14  4:00 PM  Result Value Ref Range   Color, Urine YELLOW YELLOW    APPearance CLEAR CLEAR   Specific Gravity, Urine 1.021 1.005 - 1.030   pH 5.0 5.0 - 8.0   Glucose, UA NEGATIVE NEGATIVE mg/dL   Hgb urine dipstick NEGATIVE NEGATIVE   Bilirubin Urine SMALL (A) NEGATIVE   Ketones, ur NEGATIVE NEGATIVE mg/dL   Protein, ur NEGATIVE NEGATIVE mg/dL   Urobilinogen, UA 1.0 0.0 - 1.0 mg/dL   Nitrite NEGATIVE NEGATIVE   Leukocytes, UA NEGATIVE NEGATIVE    Comment: MICROSCOPIC NOT DONE ON URINES WITH NEGATIVE PROTEIN, BLOOD, LEUKOCYTES, NITRITE, OR GLUCOSE <1000 mg/dL.  Urine culture     Status: None   Collection Time: 04/18/14  4:00 PM  Result Value Ref Range   Specimen Description URINE, CLEAN CATCH    Special Requests NONE    Colony Count      6,000 COLONIES/ML Performed at [facility]    Culture      INSIGNIFICANT GROWTH Performed at [facility]    Report Status 04/20/2014 FINAL   Drug screen panel, emergency     Status: Abnormal   Collection Time: 04/18/14  4:00 PM  Result Value Ref Range   Opiates POSITIVE (A) NONE DETECTED   Cocaine NONE DETECTED NONE DETECTED   Benzodiazepines POSITIVE (A) NONE DETECTED   Amphetamines NONE DETECTED NONE DETECTED   Tetrahydrocannabinol NONE DETECTED NONE DETECTED   Barbiturates NONE DETECTED NONE DETECTED    Comment:        DRUG SCREEN FOR MEDICAL PURPOSES ONLY.  IF CONFIRMATION IS NEEDED FOR ANY PURPOSE, NOTIFY LAB WITHIN 5 DAYS.        LOWEST DETECTABLE LIMITS FOR URINE DRUG SCREEN Drug Class       Cutoff (ng/mL) Amphetamine      1000 Barbiturate      200 Benzodiazepine   578 Tricyclics       469 Opiates          300 Cocaine          300 THC              50   Glucose, capillary     Status: Abnormal   Collection Time: 04/18/14  8:41 PM  Result Value Ref Range   Glucose-Capillary 208 (H) 70 - 99 mg/dL  MRSA PCR Screening     Status: None   Collection Time: 04/18/14  8:51 PM  Result Value  Ref Range   MRSA by PCR NEGATIVE NEGATIVE    Comment:        The GeneXpert MRSA Assay  (FDA approved for NASAL specimens only), is one component of a comprehensive MRSA colonization surveillance program. It is not intended to diagnose MRSA infection nor to guide or monitor treatment for MRSA infections.   TSH     Status: None   Collection Time: 04/18/14  9:14 PM  Result Value Ref Range   TSH 0.897 0.350 - 4.500 uIU/mL  Troponin I     Status: Abnormal   Collection Time: 04/18/14  9:14 PM  Result Value Ref Range   Troponin I 0.06 (H) <0.031 ng/mL    Comment:        PERSISTENTLY INCREASED TROPONIN VALUES IN THE RANGE OF 0.04-0.49 ng/mL CAN BE SEEN IN:       -UNSTABLE ANGINA       -CONGESTIVE HEART FAILURE       -MYOCARDITIS       -CHEST TRAUMA       -ARRYHTHMIAS       -LATE PRESENTING MYOCARDIAL INFARCTION       -COPD   CLINICAL FOLLOW-UP RECOMMENDED.   Hemoglobin A1c     Status: Abnormal   Collection Time: 04/18/14  9:14 PM  Result Value Ref Range   Hgb A1c MFr Bld 7.0 (H) 4.8 - 5.6 %    Comment: (NOTE)         Pre-diabetes: 5.7 - 6.4         Diabetes: >6.4         Glycemic control for adults with diabetes: <7.0    Mean Plasma Glucose 154 mg/dL    Comment: (NOTE) Performed At: [facility] [place], [place] [id number] Lindon Romp MD YP:[id number]   Glucose, capillary     Status: Abnormal   Collection Time: 04/19/14  1:29 AM  Result Value Ref Range   Glucose-Capillary 146 (H) 70 - 99 mg/dL  Troponin I     Status: Abnormal   Collection Time: 04/19/14  3:27 AM  Result Value Ref Range   Troponin I 0.06 (H) <0.031 ng/mL    Comment:        PERSISTENTLY INCREASED TROPONIN VALUES IN THE RANGE OF 0.04-0.49 ng/mL CAN BE SEEN IN:       -UNSTABLE ANGINA       -CONGESTIVE HEART FAILURE       -MYOCARDITIS       -CHEST TRAUMA       -ARRYHTHMIAS       -LATE PRESENTING MYOCARDIAL INFARCTION       -COPD   CLINICAL FOLLOW-UP RECOMMENDED.   Glucose, capillary     Status: Abnormal   Collection Time: 04/19/14  4:20 AM  Result  Value Ref Range   Glucose-Capillary 125 (H) 70 - 99 mg/dL  Troponin I     Status: Abnormal   Collection Time: 04/19/14  7:50 AM  Result Value Ref Range   Troponin I 0.05 (H) <0.031 ng/mL    Comment:        PERSISTENTLY INCREASED TROPONIN VALUES IN THE RANGE OF 0.04-0.49 ng/mL CAN BE SEEN IN:       -UNSTABLE ANGINA       -CONGESTIVE HEART FAILURE       -MYOCARDITIS       -CHEST TRAUMA       -ARRYHTHMIAS       -LATE PRESENTING MYOCARDIAL INFARCTION       -  COPD   CLINICAL FOLLOW-UP RECOMMENDED.   Comprehensive metabolic panel     Status: Abnormal   Collection Time: 04/19/14  7:50 AM  Result Value Ref Range   Sodium 139 135 - 145 mmol/L   Potassium 3.8 3.5 - 5.1 mmol/L   Chloride 104 96 - 112 mmol/L   CO2 25 19 - 32 mmol/L   Glucose, Bld 127 (H) 70 - 99 mg/dL   BUN 34 (H) 6 - 23 mg/dL   Creatinine, Ser 1.69 (H) 0.50 - 1.35 mg/dL   Calcium 8.5 8.4 - 10.5 mg/dL   Total Protein 6.0 6.0 - 8.3 g/dL   Albumin 3.3 (L) 3.5 - 5.2 g/dL   AST 22 0 - 37 U/L   ALT 14 0 - 53 U/L   Alkaline Phosphatase 54 39 - 117 U/L   Total Bilirubin 0.7 0.3 - 1.2 mg/dL   GFR calc non Af Amer 39 (L) >90 mL/min   GFR calc Af Amer 45 (L) >90 mL/min    Comment: (NOTE) The eGFR has been calculated using the CKD EPI equation. This calculation has not been validated in all clinical situations. eGFR's persistently <90 mL/min signify possible Chronic Kidney Disease.    Anion gap 10 5 - 15  CBC     Status: Abnormal   Collection Time: 04/19/14  7:50 AM  Result Value Ref Range   WBC 5.9 4.0 - 10.5 K/uL   RBC 3.44 (L) 4.22 - 5.81 MIL/uL   Hemoglobin 10.0 (L) 13.0 - 17.0 g/dL   HCT 31.8 (L) 39.0 - 52.0 %   MCV 92.4 78.0 - 100.0 fL   MCH 29.1 26.0 - 34.0 pg   MCHC 31.4 30.0 - 36.0 g/dL   RDW 13.6 11.5 - 15.5 %   Platelets 182 150 - 400 K/uL  Protime-INR     Status: Abnormal   Collection Time: 04/19/14  7:50 AM  Result Value Ref Range   Prothrombin Time 15.3 (H) 11.6 - 15.2 seconds   INR 1.20 0.00 - 1.49   Glucose, capillary     Status: Abnormal   Collection Time: 04/19/14  7:52 AM  Result Value Ref Range   Glucose-Capillary 122 (H) 70 - 99 mg/dL  Glucose, capillary     Status: Abnormal   Collection Time: 04/19/14  1:06 PM  Result Value Ref Range   Glucose-Capillary 122 (H) 70 - 99 mg/dL   Comment 1 Notify RN   Glucose, capillary     Status: Abnormal   Collection Time: 04/19/14  5:22 PM  Result Value Ref Range   Glucose-Capillary 139 (H) 70 - 99 mg/dL  Glucose, capillary     Status: Abnormal   Collection Time: 04/19/14  8:09 PM  Result Value Ref Range   Glucose-Capillary 100 (H) 70 - 99 mg/dL  Basic metabolic panel     Status: Abnormal   Collection Time: 04/20/14  3:27 AM  Result Value Ref Range   Sodium 140 135 - 145 mmol/L   Potassium 3.4 (L) 3.5 - 5.1 mmol/L   Chloride 109 96 - 112 mmol/L   CO2 25 19 - 32 mmol/L   Glucose, Bld 139 (H) 70 - 99 mg/dL   BUN 27 (H) 6 - 23 mg/dL   Creatinine, Ser 1.22 0.50 - 1.35 mg/dL   Calcium 8.5 8.4 - 10.5 mg/dL   GFR calc non Af Amer 58 (L) >90 mL/min   GFR calc Af Amer 67 (L) >90 mL/min    Comment: (NOTE) The  eGFR has been calculated using the CKD EPI equation. This calculation has not been validated in all clinical situations. eGFR's persistently <90 mL/min signify possible Chronic Kidney Disease.    Anion gap 6 5 - 15  CBC     Status: Abnormal   Collection Time: 04/20/14  3:27 AM  Result Value Ref Range   WBC 7.7 4.0 - 10.5 K/uL   RBC 3.27 (L) 4.22 - 5.81 MIL/uL   Hemoglobin 9.7 (L) 13.0 - 17.0 g/dL   HCT 30.4 (L) 39.0 - 52.0 %   MCV 93.0 78.0 - 100.0 fL   MCH 29.7 26.0 - 34.0 pg   MCHC 31.9 30.0 - 36.0 g/dL   RDW 13.6 11.5 - 15.5 %   Platelets 159 150 - 400 K/uL  Glucose, capillary     Status: Abnormal   Collection Time: 04/20/14  8:04 AM  Result Value Ref Range   Glucose-Capillary 113 (H) 70 - 99 mg/dL  Occult blood card to lab, stool     Status: None   Collection Time: 04/20/14  1:03 PM  Result Value Ref Range   Fecal  Occult Bld NEGATIVE NEGATIVE  Glucose, capillary     Status: Abnormal   Collection Time: 04/20/14  1:12 PM  Result Value Ref Range   Glucose-Capillary 142 (H) 70 - 99 mg/dL    Dg Chest 2 View  04/18/2014   CLINICAL DATA:  Shortness of breath for 1 day  EXAM: CHEST  2 VIEW  COMPARISON:  Chest x-ray of 03/21/2014  FINDINGS: No active infiltrate or effusion is seen. Cardiomegaly is stable. Mild eventration at the right lung base is unchanged. The bones are osteopenic.  IMPRESSION: Stable cardiomegaly.  No active lung disease.   Electronically Signed   By: Ivar Drape M.D.   On: 04/18/2014 17:14   Nm Pulmonary Perf And Vent  04/18/2014   CLINICAL DATA:  Dyspnea, history hypertension, diabetes, heart murmur  EXAM: NUCLEAR MEDICINE VENTILATION - PERFUSION LUNG SCAN  TECHNIQUE: Ventilation images were obtained in multiple projections using inhaled aerosol technetium 99 M DTPA. Perfusion images were obtained in multiple projections after intravenous injection of Tc-9mMAA.  RADIOPHARMACEUTICALS:  40 mCi Tc-928mTPA aerosol and 6 mCi Tc-9985mA  COMPARISON:  None; correlation chest radiograph 04/18/2014  FINDINGS: Ventilation: Enlargement of cardiac silhouette.  Otherwise normal.  Perfusion: Enlargement of cardiac silhouette.  Otherwise normal.  Chest radiograph: Enlargement of cardiac silhouette  IMPRESSION: Cardiomegaly.  Otherwise normal ventilation and perfusion lung scan.   Electronically Signed   By: MarLavonia DanaD.   On: 04/18/2014 19:55    Review of Systems  Constitutional: Positive for malaise/fatigue. Negative for fever.  HENT: Positive for congestion and sore throat.        Poor dentition  Respiratory: Positive for shortness of breath.   Cardiovascular: Positive for orthopnea and leg swelling. Negative for chest pain.  Musculoskeletal: Positive for back pain and joint pain.       Chronic pain requiring narcotics  Neurological: Positive for dizziness (+syncope) and loss of consciousness.        Memory loss  Psychiatric/Behavioral: The patient is nervous/anxious.   All other systems reviewed and are negative.  Blood pressure 141/92, pulse 92, temperature 98.7 F (37.1 C), temperature source Oral, resp. rate 17, height 5' 11"  (1.803 m), weight 256 lb 9.9 oz (116.4 kg), SpO2 94 %. Physical Exam  Vitals reviewed. Constitutional:  obese  HENT:  Head: Normocephalic and atraumatic.  Poor dentition with multiple  missing teeth  Eyes: EOM are normal.  Neck: Neck supple. No thyromegaly present.  Transmitted murmur B carotid  Cardiovascular:  Murmur (3/6 crescendo/ decrescendo) heard. Irregularly irregular  Respiratory: He has no wheezes. He has rales.  Diminished BS both bases  GI: Soft. There is no tenderness.  Musculoskeletal: He exhibits edema (1+ bilateral).  Lymphadenopathy:    He has no cervical adenopathy.  Neurological: He is alert. No cranial nerve deficit.  Oriented to person,place, not date  Skin: Skin is warm and dry. Rash (left ankle) noted.   Echocardiogram Study Conclusions  - Left ventricle: The cavity size was normal. Wall thickness was increased in a pattern of moderate LVH. Systolic function was normal. The estimated ejection fraction was in the range of 55% to 60%. Wall motion was normal; there were no regional wall motion abnormalities. Doppler parameters are consistent with high ventricular filling pressure. - Aortic valve: Valve mobility was restricted. There was severe stenosis. There was trivial regurgitation. Valve area (VTI): 0.56 cm^2. Valve area (Vmax): 0.61 cm^2. Valve area (Vmean): 0.56 cm^2. - Aortic root: The aortic root was moderately dilated. - Mitral valve: Calcified annulus. - Left atrium: The atrium was severely dilated. - Right atrium: The atrium was mildly dilated.  I reviewed his  Echo personally   Assessment/Plan: 74 yo man with aortic stenosis who presented after a syncopal spell. He has symptomatic critical  aortic stenosis and aortic valve replacement is indicated for survival benefit and relief of symptoms. In addition, he is in atrial fibrillation. He is a poor candidate for anticoagulation given his memory issues and falls.  He has multiple other medical problems including diabetes and early dementia(? Underlying cause). He also has chronic pain for which he takes high doses of oxycodone. He apparently has some difficulties with his gait, which I was not able to evaluate today. His creatinine was elevated on admission but improved with hydration.  He also has very poor dental hygiene. He has not seen a dentist in recent memory. He needs an orthopantogram and a dental consult. I will order those.  I did discuss possible aortic valve replacement +/- maze procedure with Peter Weiss. I reviewed the indications, risks, benefits and alternatives. I informed him of the risks of death, MI, stroke, worsened memory loss, bleeding, possible need for transfusion, infection, heart block, renal or respiratory failure, as well as the possibility of other unforeseeable complications. He is not a candidate for a mechanical valve but a tissue valve is appropriate given his age.  Will follow with you.  Revonda Standard Roxan Hockey, [place] C 04/20/2014, 2:56 PM

## 2014-04-26 NOTE — Progress Notes (Signed)
Patient ID: Peter Weiss, male   DOB: [birth year], 74 y.o.   MRN: [id number]  SICU Evening Rounds:  Hemodynamically stable  Close to extubation  Urine ouput good  CT output low.  BMET    Component Value Date/Time   NA 140 04/26/2014 1433   K 4.1 04/26/2014 1433   CL 104 04/26/2014 1316   CO2 27 04/26/2014 0424   GLUCOSE 122* 04/26/2014 1433   BUN 14 04/26/2014 1316   CREATININE 0.80 04/26/2014 1316   CALCIUM 8.5 04/26/2014 0424   GFRNONAA 84* 04/26/2014 0424   GFRAA >90 04/26/2014 0424    CBC    Component Value Date/Time   WBC 11.7* 04/26/2014 1430   RBC 3.99* 04/26/2014 1430   HGB 11.9* 04/26/2014 1433   HCT 35.0* 04/26/2014 1433   PLT 101* 04/26/2014 1430   MCV 91.0 04/26/2014 1430   MCH 29.8 04/26/2014 1430   MCHC 32.8 04/26/2014 1430   RDW 13.6 04/26/2014 1430   LYMPHSABS 1.1 04/18/2014 1404   MONOABS 0.6 04/18/2014 1404   EOSABS 0.3 04/18/2014 1404   BASOSABS 0.0 04/18/2014 1404

## 2014-04-26 NOTE — Progress Notes (Addendum)
Hypoglycemic Event  CBG:60  Treatment: dextrose 50 16ml  Symptoms: None  Follow-up CBG: Time 1630 CBG Result:83  Possible Reasons for Event: Other: glucostabilizer  Comments/MD notified: MD aware     Dell PontoShaw, Zorina Mallin Michael  Remember to initiate Hypoglycemia Order Set & complete

## 2014-04-26 NOTE — Progress Notes (Signed)
[facility]                                                                              Patient Demographics  Peter Weiss, is a 74 y.o. male, DOB - Dec 15, 1940, ZOX:[id number]  Admit date - 04/18/2014   Admitting Physician Yevonne Pax, MD  Outpatient Primary MD for the patient is Doss, Oleh Genin, NP  LOS - 8   Chief Complaint  Patient presents with  . Shortness of Breath      Admit HPI/Brief Narrative 74 y.o. male who presented after a syncopal episode. He was confused with his days. He stated his memory has been poor. Patient stated he was going to stand up and he passed out. He had no chest pain. He had noted dizziness when he stands up. He stated he had a recent cardiac cath which he thought was "a few days ago" but in reality he had it done in January. He was at [facility] when he had the initial cardiac event. Patient has a significant history of aortic stenosis. Patient was noted to have a low BP in the ED.   His inpatient w/u has revealed critical AoS. He is currently undergoing evaluation/preop planning for an AoVR. Patient was seen by dental medicine for extraction of multiple teeth before this procedure.  Interim history Had aortic valve replacement today by Dr. Dorris Fetch. Cardiothoracic surgery has graciously taken over care of the patient.  TRH will sign off.  Assessment & Plan   Critical Aortic Stenosis -cardiac cath report from [facility] Jan 2016 suggests very tight valve / inability to cross w/ catheter  -TTE notes valve area of ~0.6cm2 and gradient of ~78mmHg -Cardiology and cardiothoracic surgery consulted and appreciated -Plan is for tissue AoVR on Friday, 04/26/2014  Syncope  -Likely secondary to aortic stenosis vs medications -PT and OT consulted  Multiple carious teeth  -Seen by dental medicine -patient had multiple teeth extractions 04/24/2014  Newly diagnosed but likely subacute or chronic Afib -Appears to date to at least January 2016 as is mentioned  in an EKG from [facility] -agree w/ TCTS that he is not safe for anticoag given his apparent chronic cognitive impairment and high fall risk -Cardiology consulted and appreciated, recommended low dose metoprolol if rate control becomes an issue  Hypotension w/ a hx of HTN -Due to [facility] + BP meds in the setting of critical AoS - BP has now normalized   Acute kidney injury  -Resolved with IVF  Modestly elevated temp -Resolved, has been afebrile for over 24hours -No leukocytosis, UA/CXR no infection  Hyperlipidemia -Continue statin  Diabetes Mellitus, type 2 -Continue ISS with CBG monitoring -Hemoglobin A1c 7  Dementia  -Appears to cary a diagnosis of dementia as he is on aricept chronically  -unclear extent of prior workup  Code Status: Full   Family Communication: None at bedside  Disposition Plan: Admitted.  Patient underwent AVR, and is currently in ICU and being managed by cardiothoracic surgery.  TRH will sign off.    Time Spent in minutes   10 minutes  Procedures  2/5 carotid dopplers - 40-59% R ICA stenosis - 1-39% L ICA stenosis - vertebral flow antegrade  2/5 TTE - EF 55-60% - moderate LVH - severe AS (gradient 62mmHg) 2/12 AVR  Consults   Cardiology Cardiothoracic surgery Dental medicine  Peter Weiss, Peter Weiss D.O. on 04/26/2014 at 2:38 PM  Between 7am to 7pm - Pager - [phone]  After 7pm go to [email or web address] - password [email or web address]  And look for the night coverage person covering for me after hours  [facility] Office  [phone]

## 2014-04-26 NOTE — Progress Notes (Signed)
  Echocardiogram Echocardiogram Transesophageal has been performed.  Arvil ChacoFoster, Lawerance Matsuo 04/26/2014, 8:39 AM

## 2014-04-26 NOTE — Brief Op Note (Addendum)
04/18/2014 - 04/26/2014  12:28 PM  PATIENT:  Peter Weiss  74 y.o. male  PRE-OPERATIVE DIAGNOSIS:  SEVERE AS, CHRONIC ATRIAL FIBRILLATION  POST-OPERATIVE DIAGNOSIS:  SEVERE AS, CHRONIC ATRIAL FIBRILLATION  PROCEDURE:  Procedure(s):  AORTIC VALVE REPLACEMENT (AVR) (N/A) -27mm [employer] Per[employer]ount Pericardial Tissue Valve  MAZE PROCEDURE(LEFT and RIGHT)   CLIPPING OF LA APPENDAGE  TRANSESOPHAGEAL ECHOCARDIOGRAM (TEE) (N/A)  SURGEON:  Surgeon(s) and Role:    * Loreli SlotSteven C Domanique Huesman, MD - Primary  PHYSICIAN ASSISTANT: Lowella DandyErin Barrett PA-C  ANESTHESIA:   general  EBL:  Total I/O In: -  Out: 1200 [Urine:1200]  BLOOD ADMINISTERED: CELLSAVER  DRAINS: Mediastinal Chest drains   LOCAL MEDICATIONS USED:  NONE  SPECIMEN:  Source of Specimen:  Aortic Valve leaflets  DISPOSITION OF SPECIMEN:  PATHOLOGY  COUNTS:  YES  PLAN OF CARE: Admit to inpatient   PATIENT DISPOSITION:  ICU - intubated and hemodynamically stable.   Delay start of Pharmacological VTE agent (>24hrs) due to surgical blood loss or risk of bleeding: yes  Aortic Valve Etiology   Aortic Insufficiency:  Trivial/Trace  Aortic Valve Disease:  Yes.  Aortic Stenosis:  Yes. Smallest Aortic Valve Area: 0.56 cm2; Highest Mean Gradient: 62 mmHg.  Etiology (Choose at least one and up to  5 etiologies):  Degenerative - Calcified   Aortic Valve  Procedure Performed:  Replacement: Yes.  Bioprosthetic Valve. Implant Model Number:2700, Size:27, Unique Device Identifier:[id number]  Repair/Reconstruction: No.   Aortic Annular Enlargement: No.    Tricuspid calcific, stenotic aortic valve Severe annular calcification Cardiomegaly Maze procedure difficult secondary to body habitus

## 2014-04-26 NOTE — Addendum Note (Signed)
Addendum  created 04/26/14 1609 by Coralee Rudobert Johnesha Acheampong, CRNA   Modules edited: Anesthesia Medication Administration

## 2014-04-26 NOTE — Progress Notes (Signed)
Patient arrived with sats in the 80's. Recruitment maneuver performed for 2 minutes; PCV 35, rate 10, i-time 3.00, FiO2 100%, and PEEP 5. Sats at 100%. RN aware.

## 2014-04-26 NOTE — Progress Notes (Signed)
Switched to PS/CPAP per wean protocol.  10/5, 40% FIO2.  HR-90, RR-21, SPO2-94%, BBS-Clear, diminished

## 2014-04-26 NOTE — Anesthesia Preprocedure Evaluation (Addendum)
Anesthesia Evaluation  Patient identified by MRN, date of birth, ID band Patient awake    Reviewed: Allergy & Precautions, NPO status , Patient's Chart, lab work & pertinent test results  History of Anesthesia Complications Negative for: history of anesthetic complications  Airway Mallampati: III  TM Distance: >3 FB Neck ROM: Full    Dental  (+) Missing, Chipped, Poor Dentition,    Pulmonary neg pulmonary ROS,  breath sounds clear to auscultation        Cardiovascular hypertension, Pt. on medications and Pt. on home beta blockers + dysrhythmias Atrial Fibrillation + Valvular Problems/Murmurs AS Rhythm:Irregular + Systolic murmurs    Neuro/Psych PSYCHIATRIC DISORDERS Depression negative neurological ROS     GI/Hepatic Neg liver ROS, GERD-  Medicated and Controlled,  Endo/Other  diabetesMorbid obesity  Renal/GU negative Renal ROS     Musculoskeletal  (+) Arthritis -,   Abdominal   Peds  Hematology  (+) anemia ,   Anesthesia Other Findings   Reproductive/Obstetrics                           Anesthesia Physical Anesthesia Plan  ASA: IV  Anesthesia Plan: General   Post-op Pain Management:    Induction: Intravenous  Airway Management Planned: Oral ETT  Additional Equipment: Arterial line, CVP, PA Cath, TEE and Ultrasound Guidance Line Placement  Intra-op Plan:   Post-operative Plan: Post-operative intubation/ventilation  Informed Consent: I have reviewed the patients History and Physical, chart, labs and discussed the procedure including the risks, benefits and alternatives for the proposed anesthesia with the patient or authorized representative who has indicated his/her understanding and acceptance.   Dental advisory given  Plan Discussed with: CRNA and Surgeon  Anesthesia Plan Comments:         Anesthesia Quick Evaluation

## 2014-04-27 ENCOUNTER — Inpatient Hospital Stay (HOSPITAL_COMMUNITY): Payer: Medicare Other

## 2014-04-27 LAB — MAGNESIUM
Magnesium: 2.5 mg/dL (ref 1.5–2.5)
Magnesium: 2.4 mg/dL (ref 1.5–2.5)

## 2014-04-27 LAB — GLUCOSE, CAPILLARY
GLUCOSE-CAPILLARY: 117 mg/dL — AB (ref 70–99)
GLUCOSE-CAPILLARY: 124 mg/dL — AB (ref 70–99)
GLUCOSE-CAPILLARY: 165 mg/dL — AB (ref 70–99)
GLUCOSE-CAPILLARY: 95 mg/dL (ref 70–99)
Glucose-Capillary: 105 mg/dL — ABNORMAL HIGH (ref 70–99)
Glucose-Capillary: 99 mg/dL (ref 70–99)
Glucose-Capillary: 109 mg/dL — ABNORMAL HIGH (ref 70–99)
Glucose-Capillary: 136 mg/dL — ABNORMAL HIGH (ref 70–99)
Glucose-Capillary: 173 mg/dL — ABNORMAL HIGH (ref 70–99)
Glucose-Capillary: 153 mg/dL — ABNORMAL HIGH (ref 70–99)
Glucose-Capillary: 117 mg/dL — ABNORMAL HIGH (ref 70–99)
Glucose-Capillary: 108 mg/dL — ABNORMAL HIGH (ref 70–99)
Glucose-Capillary: 183 mg/dL — ABNORMAL HIGH (ref 70–99)
Glucose-Capillary: 99 mg/dL (ref 70–99)

## 2014-04-27 LAB — CBC
HEMATOCRIT: 30 % — AB (ref 39.0–52.0)
HEMATOCRIT: 27.5 % — AB (ref 39.0–52.0)
HEMOGLOBIN: 9.9 g/dL — AB (ref 13.0–17.0)
HEMOGLOBIN: 8.8 g/dL — AB (ref 13.0–17.0)
MCH: 29.9 pg (ref 26.0–34.0)
MCH: 29.6 pg (ref 26.0–34.0)
MCHC: 33 g/dL (ref 30.0–36.0)
MCHC: 32 g/dL (ref 30.0–36.0)
MCV: 90.6 fL (ref 78.0–100.0)
MCV: 92.6 fL (ref 78.0–100.0)
PLATELETS: 98 10*3/uL — AB (ref 150–400)
Platelets: 95 10*3/uL — ABNORMAL LOW (ref 150–400)
RBC: 3.31 MIL/uL — ABNORMAL LOW (ref 4.22–5.81)
RBC: 2.97 MIL/uL — ABNORMAL LOW (ref 4.22–5.81)
RDW: 14 % (ref 11.5–15.5)
RDW: 14.2 % (ref 11.5–15.5)
WBC: 9.6 10*3/uL (ref 4.0–10.5)
WBC: 9.3 10*3/uL (ref 4.0–10.5)

## 2014-04-27 LAB — POCT I-STAT 3, ART BLOOD GAS (G3+)
Acid-Base Excess: 5 mmol/L — ABNORMAL HIGH (ref 0.0–2.0)
Acid-base deficit: 2 mmol/L (ref 0.0–2.0)
BICARBONATE: 23 meq/L (ref 20.0–24.0)
Bicarbonate: 29.8 mEq/L — ABNORMAL HIGH (ref 20.0–24.0)
O2 SAT: 94 %
O2 Saturation: 94 %
PCO2 ART: 44.1 mmHg (ref 35.0–45.0)
PCO2 ART: 38.3 mmHg (ref 35.0–45.0)
PO2 ART: 72 mmHg — AB (ref 80.0–100.0)
Patient temperature: 100.2
Patient temperature: 37.6
TCO2: 31 mmol/L (ref 0–100)
TCO2: 24 mmol/L (ref 0–100)
pH, Arterial: 7.441 (ref 7.350–7.450)
pH, Arterial: 7.39 (ref 7.350–7.450)
pO2, Arterial: 71 mmHg — ABNORMAL LOW (ref 80.0–100.0)

## 2014-04-27 LAB — BASIC METABOLIC PANEL
ANION GAP: 7 (ref 5–15)
BUN: 14 mg/dL (ref 6–23)
CHLORIDE: 106 mmol/L (ref 96–112)
CO2: 27 mmol/L (ref 19–32)
Calcium: 7.8 mg/dL — ABNORMAL LOW (ref 8.4–10.5)
Creatinine, Ser: 0.84 mg/dL (ref 0.50–1.35)
GFR, EST NON AFRICAN AMERICAN: 86 mL/min — AB (ref >90)
Glucose, Bld: 138 mg/dL — ABNORMAL HIGH (ref 70–99)
Potassium: 4.3 mmol/L (ref 3.5–5.1)
Sodium: 140 mmol/L (ref 135–145)

## 2014-04-27 LAB — POCT I-STAT, CHEM 8
BUN: 20 mg/dL (ref 6–23)
CHLORIDE: 102 mmol/L (ref 96–112)
CREATININE: 1.1 mg/dL (ref 0.50–1.35)
Calcium, Ion: 1.22 mmol/L (ref 1.13–1.30)
GLUCOSE: 134 mg/dL — AB (ref 70–99)
HCT: 26 % — ABNORMAL LOW (ref 39.0–52.0)
HEMOGLOBIN: 8.8 g/dL — AB (ref 13.0–17.0)
Potassium: 4.1 mmol/L (ref 3.5–5.1)
Sodium: 136 mmol/L (ref 135–145)
TCO2: 22 mmol/L (ref 0–100)

## 2014-04-27 LAB — CREATININE, SERUM
CREATININE: 1.21 mg/dL (ref 0.50–1.35)
GFR calc Af Amer: 68 mL/min — ABNORMAL LOW (ref >90)
GFR calc non Af Amer: 58 mL/min — ABNORMAL LOW (ref >90)

## 2014-04-27 MED ORDER — ENOXAPARIN SODIUM 40 MG/0.4ML ~~LOC~~ SOLN
40.0000 mg | Freq: Every day | SUBCUTANEOUS | Status: DC
  Administered 2014-04-27 – 2014-05-01 (×5): 40 mg via SUBCUTANEOUS
  Filled 2014-04-27 (×6): qty 0.4

## 2014-04-27 MED ORDER — INSULIN DETEMIR 100 UNIT/ML ~~LOC~~ SOLN
20.0000 [IU] | Freq: Every day | SUBCUTANEOUS | Status: DC
  Administered 2014-04-27 – 2014-04-29 (×3): 20 [IU] via SUBCUTANEOUS
  Filled 2014-04-27 (×4): qty 0.2

## 2014-04-27 MED ORDER — INSULIN ASPART 100 UNIT/ML ~~LOC~~ SOLN
0.0000 [IU] | SUBCUTANEOUS | Status: DC
  Administered 2014-04-27 (×2): 4 [IU] via SUBCUTANEOUS
  Administered 2014-04-28 (×2): 2 [IU] via SUBCUTANEOUS

## 2014-04-27 MED ORDER — INSULIN DETEMIR 100 UNIT/ML ~~LOC~~ SOLN
20.0000 [IU] | Freq: Every day | SUBCUTANEOUS | Status: DC
Start: 2014-04-28 — End: 2014-04-27

## 2014-04-27 NOTE — Procedures (Signed)
Extubation Procedure Note  Patient Details:   Name: Elly Modenahomas Patrick Colledge DOB: 14-Apr-1940 MRN: [id number]   Airway Documentation:     Evaluation  O2 sats: stable throughout Complications: No apparent complications Patient did tolerate procedure well. Bilateral Breath Sounds: Clear Suctioning: Airway Yes  Placed on 4l/min Dearing Incentive spirometer instructed 500ml NIF-20, FVC 800ml Cuff leak  Newt LukesGroendal, Brandom Kerwin Ann 04/27/2014, 10:02 AM

## 2014-04-27 NOTE — Progress Notes (Signed)
Patient refused to perform wean protocol NIF and VC. Patient was speaking inappropriately and using rude hand gestures toward the RN and RT's in the room trying to help him. RT's explained to the patient, the breathing test that we were trying to have him perform were for his safety and part of the hospital protocol to remove him from the ventilator. Patient did not want to hear what we were saying and continued to refuse. RN is aware and patient was placed back on full support.

## 2014-04-27 NOTE — Op Note (Signed)
NAMEAAHAN, Peter NO.:  [id number]  MEDICAL RECORD NO.:  [id number]  LOCATION:  2S14C                        FACILITY:  [facility]  PHYSICIAN:  Peter Weiss, M.D.DATE OF BIRTH:  1941-01-20  DATE OF PROCEDURE:  04/26/2014 DATE OF DISCHARGE:                              OPERATIVE REPORT   PREOPERATIVE DIAGNOSIS:  Critical aortic stenosis and chronic atrial fibrillation.  POSTOPERATIVE DIAGNOSIS:  Critical aortic stenosis and chronic atrial fibrillation  PROCEDURE: 1. Aortic valve replacement with 27 mm Edwards Perimount     Bovine pericardial valve (model number 2700, serial number     [id number]). 2. Modified-Cox IV maze procedure using radiofrequency ablation. 3. Clipping of left atrial appendage.  SURGEON:  Peter Weiss, M.D.  ASSISTANT: Lowella Dandy, PA-C.  ANESTHESIA:  General.  FINDINGS:  Transesophageal echocardiography revealed critical aortic stenosis and mild mitral regurgitation.  There was cardiomegaly and left ventricular hypertrophy.    Procedure technically difficult due to patient's body habitus.  Aortic valve tricuspid calcific stenotic with severe annular calcification.    Postbypass transesophageal echocardiography revealed no change in left ventricular function and good function of the prosthetic valve with no perivalvular leaks.  CLINICAL NOTE:  Peter Weiss is a 74 year old gentleman with known aortic stenosis who presented following a syncopal spell.  Echocardiogram showed critical aortic stenosis with a valve area of 0.6 cm2.  He has previously had a cardiac catheterization, which showed no significant coronary disease.  He was also in atrial fibrillation on arrival and remained in atrial fibrillation throughout his hospital stay.  He was advised to undergo aortic valve replacement and maze procedure.  The indications, risks, benefits, and alternatives were discussed with the patient on multiple occasions.  He  understood and accepted the risks and agreed to proceed. Given his age a bioprosthetic valve was the valve of choice.  OPERATIVE NOTE:  Peter Weiss was brought to the preoperative holding area on April 26, 2014.  He had a Swan-Ganz catheter and an arterial line placed by Anesthesia.  He was taken to the operating room, anesthetized, and intubated.  A Foley catheter was placed. Intravenous antibiotics were given.  Transesophageal echocardiography was performed.  Please refer to Dr. Roxy Cedar separately dictated note for full details.  He did have critical aortic stenosis. There was mild mitral regurgitation.  There was left ventricular hypertrophy.  The chest, abdomen, and legs were prepped and draped in the usual sterile fashion.  After completing the preoperative checklist, including confirming that antibiotics had been given, a median sternotomy was performed and hemostasis was achieved.  A retractor was placed.  The pericardium was opened, there was massive cardiomegaly.  The aorta was large, but proportionate being about 4 cm in diameter.  The patient was given the full dose of heparin.  After confirming adequate anticoagulation with ACT measurement, the aorta was cannulated via concentric 2-0 Ethibond pledgeted pursestring sutures.  A 31-French metal tip cannula was placed via a pursestring suture in the superior vena cava.  Cardiopulmonary bypass was instituted, and the patient was cooled to 28 degrees Celsius. A 40-French malleable cannula was placed via a pursestring suture in the inferior aspect of the right atrium  and directed into the inferior vena cava.  It was connected to the bypass circuit as well.  Caval tapes were placed, but only tightened when the right atrium was opened.  The heart was elevated. The left-sided pulmonary veins were dissected out.  The Atricure bipolar radiofrequency ablation device was placed across the left atrium just proximal to the confluence of the  pulmonary veins.  Two parallel ablation lines were performed.  The base of the left atrial appendage also was ablated with 2 parallel lines.  A left ventricular vent was placed via a pursestring suture in the right superior pulmonary vein.  A retrograde cardioplegia cannula placed via pursestring suture in the right atrium and directed into the coronary sinus.  An antegrade cardioplegia cannula was placed in the ascending Aorta. Carbon dioxide was insufflated into the operative field beginning with the initiation of bypass and continuing until after the open portion of the procedure was completed.  The aorta was crossclamped, and the left ventricle was emptied via aortic root vent and left ventricular vent.  Cardiac arrest then was achieved with a combination of cold antegrade and retrograde blood cardioplegia and topical iced saline.  An initial 500 mL of cardioplegia was administered antegrade.  There was a diastolic arrest.  Then 500 mL of cardioplegia was administered retrograde, and there was continued septal cooling, but there started to be some electrical activity noted on the monitor.  An additional 500 mL of cardioplegia was given antegrade, ultimately there was a diastolic arrest with septal cooling to 10 degrees Celsius. Additional cardioplegia was given via the retrograde cannula at 20- minute intervals.  An aortotomy was performed.  The aortic valve was inspected.  There was tricuspid valve with heavily calcified leaflets. The left and right leaflets were partially fused at the commisure. The leaflets were excised. There was heavy annular calcification.  The annulus was debrided taking care to contain all calcific debris.  The annulus was copiously irrigated with iced saline.  The annulus sized for a 27 mm Edwards Perimount Bovine pericardial valve.  2-0 Ethibond horizontal mattress sutures with subannular pledgets were placed circumferentially around the Anulus. 15 sutures  were utilized.  They were then passed through the sewing ring of the valve.  The valve was lowered into place and the sutures were sequentially tied.  The valve was well seated.  There was no impingement on the coronary ostia.  The annulus was probed with a fine tip right angle and no gaps were noted.  The aortotomy then was closed in 2 layers with a running 4-0 Prolene horizontal mattress suture followed by running 4-0 Prolene simple suture.  The interatrial groove was dissected out.  A left atriotomy was performed.  The posterior ablation line for the right-sided pulmonary veins was done with the bipolar device.  The monopolar probe was used to create a connecting lesion to the 4 o'clock position on the mitral anulus. It was used to create connecting lesions between the pulmonary veins and from the left-sided pulmonary veins to the base of the appendage as well.  The left atriotomy was closed in 2 layers, a running horizontal mattress suture followed by a running 4-0 Prolene simple suture.  Rewarming was begun.  An atriotomy was made in the lateral wall of the right atrium and the tip of the right atrial appendage was excised.  The bipolar probe was used to create lesions extending to the junction of the right atrium to the inferior vena cava and superior vena  cava as well as a lesion connecting from the appendage to the tricuspid anulus.  Finally, the isthmus lesion to the coronary sinus was completed with the monopolar device.  The right atriotomy was closed with 2 layers of running 4-0 Prolene suture.  The caval tapes were released, and the right atrium was de-aired through the right atrial appendage.  This then was oversewn with a 4-0 Prolene suture as well.  The patient was placed in Trendelenburg position.  The aortic crossclamp was removed.  Total crossclamp time was 129 minutes.  He required a single defibrillation with 20 joules and then was in a junctional rhythm thereafter.  While  rewarming was completed, the aortotomy and atriotomies were inspect for hemostasis.  The superior vena cava cannula was repositioned into the right atrium and the inferior caval cannula was removed. Epicardial pacing wires were placed on the right atrium and right ventricle. When the core temperature reached 37 degrees Celsius, a low-dose dopamine infusion was initiated.  He was weaned for cardiopulmonary bypass on the first attempt.  Total bypass time was 194 minutes.  The initial cardiac index was greater than 2 L/minute/m2, and he remained hemodynamically stable throughout postbypass period.  Postbypass transesophageal echocardiography revealed septal dyskinesis, consistent with pacing, but no other change in left ventricular function.  The prosthetic valve was functioning well with no perivalvular leaks.  A test dose of protamine was administered and was well tolerated.  The superior vena caval and aortic cannulae were removed.  The cardioplegia cannulae had been removed prior to weaning from bypass.  The chest was copiously irrigated with warm saline.  Hemostasis was achieved.  The pericardium was reapproximated over the aorta with interrupted 3-0 silk sutures.  Two mediastinal chest tubes were placed via separate subcostal incisions.  The sternum was closed with a combination of single and double heavy gauge stainless steel wires.  The pectoralis fascia, subcutaneous tissue, and skin were closed in standard fashion.  All sponge, needle, and instrument counts were correct at the end of the procedure.  The patient was taken from the operating room to the surgical intensive care unit in good condition.     Peter DecentSteven C. Dorris FetchHendrickson, M.D.     SCH/MEDQ  D:  04/26/2014  T:  04/27/2014  Job:  [id number]

## 2014-04-27 NOTE — Progress Notes (Signed)
Patient ID: Peter Weiss, male   DOB: 08-06-1940, 74 y.o.   MRN: [id number]  SICU Evening Rounds:  Hemodynamically stable but borderline BP.  Urine output 20/hr.  BMET    Component Value Date/Time   NA 136 04/27/2014 1831   K 4.1 04/27/2014 1831   CL 102 04/27/2014 1831   CO2 27 04/27/2014 0400   GLUCOSE 134* 04/27/2014 1831   BUN 20 04/27/2014 1831   CREATININE 1.10 04/27/2014 1831   CALCIUM 7.8* 04/27/2014 0400   GFRNONAA 58* 04/27/2014 1828   GFRAA 68* 04/27/2014 1828     Up in chair.

## 2014-04-27 NOTE — Progress Notes (Signed)
RT note- wean started again, patient is awake and follows commands, ABG in 20 min. FVC-800, good cuff leak

## 2014-04-27 NOTE — Progress Notes (Signed)
RT note-patient refuses to work through process for extubation, waiting on MD to proceed.

## 2014-04-27 NOTE — Progress Notes (Signed)
Pt c/o difficulty breathing through  ETT.  I explained that this is a normal feeling that almost everyone goes through. He passed the vital capacity part of the weaning process, but became distracted with some condensation that had collected in a dip in the ETT and refused to due anything else until I provided some paper so he could tell us some concerns he had.  I did this and he said we where going to give him pnuemonia by leaving water in the tube.  I explained that this is also normal and if he just would complete the final NIF test he could in fact have the ETT removed.He said he would do absolutely nothing else that was asked of him until Dr. Dorris FetchHendrickson was at the bedside to pull the ETT himself.  I explained that he wasn't on call, and the only reason the on call Dr. was coming in was d/t an emergency and this was not one.  He shot me the middle finger right in my face and said he wanted to stay on the vent until morning.  His wishes shall be carried out.  I will continue to monitor.

## 2014-04-27 NOTE — Progress Notes (Signed)
Patient placed back on full support due to adamantly refusing wean protocol.  He would not perform the NIFM and requested that I deflate and pull the ET tube.  I explained to the patient that we could not pull the tube without wean protocol first.  He asked for a clipboard where he requested a second RT.  Eartha InchArielle Fowler, RRT assisted and he wrote that he was "pissed" and he would like to wait until the morning to speak with a physician before extubation.  He then preceded to use his finger in an obscene way towards the RT's and RN's in room.

## 2014-04-27 NOTE — Progress Notes (Signed)
1 Day Post-Op Procedure(s) (LRB): AORTIC VALVE REPLACEMENT (AVR) (N/A) MAZE (N/A) TRANSESOPHAGEAL ECHOCARDIOGRAM (TEE) (N/A) Subjective:  Just extubated. He refused extubation overnight as documented in the nurses note.  Objective: Vital signs in last 24 hours: Temp:  [98.4 F (36.9 C)-100.6 F (38.1 C)] 99.7 F (37.6 C) (02/13 0900) Pulse Rate:  [88-92] 90 (02/13 0900) Cardiac Rhythm:  [-] Atrial paced (02/13 0800) Resp:  [12-21] 12 (02/13 0900) BP: (87-142)/(54-73) 101/60 mmHg (02/13 0900) SpO2:  [88 %-100 %] 97 % (02/13 0900) Arterial Line BP: (110-275)/(54-268) 113/54 mmHg (02/13 0900) FiO2 (%):  [40 %-100 %] 40 % (02/13 0926) Weight:  [115.6 kg (254 lb 13.6 oz)-118.1 kg (260 lb 5.8 oz)] 118.1 kg (260 lb 5.8 oz) (02/13 0500)  Hemodynamic parameters for last 24 hours: PAP: (23-35)/(14-23) 23/15 mmHg CO:  [4 L/min-5.5 L/min] 5.5 L/min CI:  [1.7 L/min/m2-2.4 L/min/m2] 2.4 L/min/m2  Intake/Output from previous day: 02/12 0701 - 02/13 0700 In: 5552.7 [I.V.:4098.7; Blood:934; NG/GT:120; IV Piggyback:400] Out: 4975 [Urine:3015; Emesis/NG output:50; Blood:1400; Chest Tube:510] Intake/Output this shift: Total I/O In: 100.5 [I.V.:100.5] Out: 105 [Urine:75; Chest Tube:30]  General appearance: slowed mentation Heart: regular rate and rhythm, S1, S2 normal, no murmur, click, rub or gallop Lungs: clear to auscultation bilaterally Extremities: edema moderate Wound: dressing dry  Lab Results:  Recent Labs  04/26/14 2120 04/26/14 2135 04/27/14 0400  WBC 11.8*  --  9.6  HGB 10.7* 10.9* 9.9*  HCT 31.9* 32.0* 30.0*  PLT 102*  --  98*   BMET:  Recent Labs  04/26/14 0424  04/26/14 2135 04/27/14 0400  NA 139  < > 140 140  K 3.8  < > 4.1 4.3  CL 105  < > 105 106  CO2 27  --   --  27  GLUCOSE 167*  < > 152* 138*  BUN 14  < > 13 14  CREATININE 0.89  < > 0.70 0.84  CALCIUM 8.5  --   --  7.8*  < > = values in this interval not displayed.  PT/INR:  Recent Labs   04/26/14 1430  LABPROT 17.8*  INR 1.46   ABG    Component Value Date/Time   PHART 7.441 04/27/2014 0104   HCO3 29.8* 04/27/2014 0104   TCO2 31 04/27/2014 0104   O2SAT 94.0 04/27/2014 0104   CBG (last 3)   Recent Labs  04/27/14 0305 04/27/14 0407 04/27/14 0514  GLUCAP 109* 136* 117*    Assessment/Plan: S/P Procedure(s) (LRB): AORTIC VALVE REPLACEMENT (AVR) (N/A) MAZE (N/A) TRANSESOPHAGEAL ECHOCARDIOGRAM (TEE) (N/A)  Hemodynamically stable. He is being atrial paced at 80 since underlying rhythm is junctional in the 30's. Hold beta blocker. Mobilize Diuresis once BP stable off dopamine Diabetes control: Preop Hgb A1c was 7 on no medication. Start Levemir and SSI d/c tubes/lines Continue foley due to patient in ICU and urinary output monitoring See progression orders   LOS: 9 days    Peter Weiss K 04/27/2014

## 2014-04-28 ENCOUNTER — Inpatient Hospital Stay (HOSPITAL_COMMUNITY): Payer: Medicare Other

## 2014-04-28 LAB — CBC
HEMATOCRIT: 28.2 % — AB (ref 39.0–52.0)
HEMOGLOBIN: 9 g/dL — AB (ref 13.0–17.0)
MCH: 29.8 pg (ref 26.0–34.0)
MCHC: 31.9 g/dL (ref 30.0–36.0)
MCV: 93.4 fL (ref 78.0–100.0)
Platelets: 100 10*3/uL — ABNORMAL LOW (ref 150–400)
RBC: 3.02 MIL/uL — AB (ref 4.22–5.81)
RDW: 14.1 % (ref 11.5–15.5)
WBC: 10.1 10*3/uL (ref 4.0–10.5)

## 2014-04-28 LAB — BASIC METABOLIC PANEL
Anion gap: 3 — ABNORMAL LOW (ref 5–15)
BUN: 25 mg/dL — ABNORMAL HIGH (ref 6–23)
CO2: 28 mmol/L (ref 19–32)
CREATININE: 1.42 mg/dL — AB (ref 0.50–1.35)
Calcium: 8 mg/dL — ABNORMAL LOW (ref 8.4–10.5)
Chloride: 104 mmol/L (ref 96–112)
GFR calc non Af Amer: 48 mL/min — ABNORMAL LOW (ref >90)
GFR, EST AFRICAN AMERICAN: 56 mL/min — AB (ref >90)
Glucose, Bld: 156 mg/dL — ABNORMAL HIGH (ref 70–99)
Potassium: 3.9 mmol/L (ref 3.5–5.1)
SODIUM: 135 mmol/L (ref 135–145)

## 2014-04-28 LAB — GLUCOSE, CAPILLARY
GLUCOSE-CAPILLARY: 156 mg/dL — AB (ref 70–99)
GLUCOSE-CAPILLARY: 158 mg/dL — AB (ref 70–99)
Glucose-Capillary: 83 mg/dL (ref 70–99)
Glucose-Capillary: 87 mg/dL (ref 70–99)
Glucose-Capillary: 141 mg/dL — ABNORMAL HIGH (ref 70–99)
Glucose-Capillary: 117 mg/dL — ABNORMAL HIGH (ref 70–99)

## 2014-04-28 MED ORDER — POTASSIUM CHLORIDE CRYS ER 20 MEQ PO TBCR
40.0000 meq | EXTENDED_RELEASE_TABLET | Freq: Once | ORAL | Status: AC
  Administered 2014-04-28: 40 meq via ORAL
  Filled 2014-04-28: qty 2

## 2014-04-28 MED ORDER — ONDANSETRON HCL 4 MG PO TABS: 4.0000 mg | ORAL_TABLET | Freq: Four times a day (QID) | ORAL | Status: DC | PRN

## 2014-04-28 MED ORDER — DOCUSATE SODIUM 100 MG PO CAPS
200.0000 mg | ORAL_CAPSULE | Freq: Every day | ORAL | Status: DC
  Administered 2014-04-29 – 2014-05-02 (×2): 200 mg via ORAL
  Filled 2014-04-28 (×4): qty 2

## 2014-04-28 MED ORDER — FUROSEMIDE 10 MG/ML IJ SOLN
80.0000 mg | Freq: Once | INTRAMUSCULAR | Status: AC
  Administered 2014-04-28: 80 mg via INTRAVENOUS
  Filled 2014-04-28: qty 8

## 2014-04-28 MED ORDER — FUROSEMIDE 40 MG PO TABS
40.0000 mg | ORAL_TABLET | Freq: Every day | ORAL | Status: DC
  Administered 2014-04-29: 40 mg via ORAL
  Filled 2014-04-28 (×2): qty 1

## 2014-04-28 MED ORDER — INSULIN ASPART 100 UNIT/ML ~~LOC~~ SOLN
0.0000 [IU] | Freq: Three times a day (TID) | SUBCUTANEOUS | Status: DC
  Administered 2014-04-28 – 2014-05-01 (×5): 2 [IU] via SUBCUTANEOUS

## 2014-04-28 MED ORDER — TRAMADOL HCL 50 MG PO TABS
50.0000 mg | ORAL_TABLET | ORAL | Status: DC | PRN
  Administered 2014-04-28 – 2014-04-30 (×4): 100 mg via ORAL
  Filled 2014-04-28 (×4): qty 2

## 2014-04-28 MED ORDER — ASPIRIN EC 325 MG PO TBEC
325.0000 mg | DELAYED_RELEASE_TABLET | Freq: Every day | ORAL | Status: DC
  Administered 2014-04-29 – 2014-05-02 (×4): 325 mg via ORAL
  Filled 2014-04-28 (×4): qty 1

## 2014-04-28 MED ORDER — OXYCODONE HCL 5 MG PO TABS
5.0000 mg | ORAL_TABLET | ORAL | Status: DC | PRN
  Administered 2014-04-28 – 2014-04-29 (×3): 10 mg via ORAL
  Filled 2014-04-28 (×3): qty 2

## 2014-04-28 MED ORDER — ONDANSETRON HCL 4 MG/2ML IJ SOLN: 4.0000 mg | Freq: Four times a day (QID) | INTRAMUSCULAR | Status: DC | PRN

## 2014-04-28 MED ORDER — BISACODYL 10 MG RE SUPP: 10.0000 mg | Freq: Every day | RECTAL | Status: DC | PRN

## 2014-04-28 MED ORDER — POTASSIUM CHLORIDE CRYS ER 20 MEQ PO TBCR
20.0000 meq | EXTENDED_RELEASE_TABLET | Freq: Every day | ORAL | Status: DC
  Administered 2014-04-29 – 2014-05-02 (×4): 20 meq via ORAL
  Filled 2014-04-28 (×6): qty 1

## 2014-04-28 MED ORDER — MOVING RIGHT ALONG BOOK
Freq: Once | Status: DC
  Filled 2014-04-28: qty 1

## 2014-04-28 MED ORDER — BISACODYL 5 MG PO TBEC: 10.0000 mg | DELAYED_RELEASE_TABLET | Freq: Every day | ORAL | Status: DC | PRN

## 2014-04-28 MED ORDER — SODIUM CHLORIDE 0.9 % IJ SOLN: 3.0000 mL | INTRAMUSCULAR | Status: DC | PRN

## 2014-04-28 MED ORDER — SODIUM CHLORIDE 0.9 % IV SOLN: 250.0000 mL | INTRAVENOUS | Status: DC | PRN

## 2014-04-28 MED ORDER — SODIUM CHLORIDE 0.9 % IJ SOLN
3.0000 mL | Freq: Two times a day (BID) | INTRAMUSCULAR | Status: DC
  Administered 2014-04-28 – 2014-05-02 (×8): 3 mL via INTRAVENOUS

## 2014-04-28 MED ORDER — PANTOPRAZOLE SODIUM 40 MG PO TBEC
40.0000 mg | DELAYED_RELEASE_TABLET | Freq: Every day | ORAL | Status: DC
  Administered 2014-04-29 – 2014-05-02 (×4): 40 mg via ORAL
  Filled 2014-04-28 (×3): qty 1

## 2014-04-28 MED ORDER — ACETAMINOPHEN 325 MG PO TABS
650.0000 mg | ORAL_TABLET | Freq: Four times a day (QID) | ORAL | Status: DC | PRN
  Administered 2014-04-28: 650 mg via ORAL
  Filled 2014-04-28: qty 2

## 2014-04-28 NOTE — Progress Notes (Signed)
2 Days Post-Op Procedure(s) (LRB): AORTIC VALVE REPLACEMENT (AVR) (N/A) MAZE (N/A) TRANSESOPHAGEAL ECHOCARDIOGRAM (TEE) (N/A) Subjective:  No complaints  Objective: Vital signs in last 24 hours: Temp:  [96.8 F (36 C)-99.5 F (37.5 C)] 96.8 F (36 C) (02/14 0700) Pulse Rate:  [72-101] 73 (02/14 0900) Cardiac Rhythm:  [-] Normal sinus rhythm (02/14 0800) Resp:  [13-23] 14 (02/14 0900) BP: (83-139)/(48-88) 119/70 mmHg (02/14 0900) SpO2:  [92 %-100 %] 100 % (02/14 0900) Arterial Line BP: (97-126)/(44-62) 108/44 mmHg (02/13 1500) Weight:  [121.8 kg (268 lb 8.3 oz)] 121.8 kg (268 lb 8.3 oz) (02/14 0400)  Hemodynamic parameters for last 24 hours: PAP: (31-35)/(19-22) 31/22 mmHg CO:  [4.2 L/min-4.4 L/min] 4.2 L/min CI:  [1.8 L/min/m2-1.9 L/min/m2] 1.8 L/min/m2  Intake/Output from previous day: 02/13 0701 - 02/14 0700 In: 1494.5 [P.O.:960; I.V.:434.5; IV Piggyback:100] Out: 535 [Urine:455; Chest Tube:80] Intake/Output this shift: Total I/O In: 20 [I.V.:20] Out: 80 [Urine:80]  General appearance: alert and cooperative Neurologic: intact Heart: regular rate and rhythm, S1, S2 normal, no murmur, click, rub or gallop Lungs: diminished breath sounds bibasilar Abdomen: soft, non-tender; bowel sounds normal; no masses,  no organomegaly Extremities: edema moderate Wound: dressings dry  Lab Results:  Recent Labs  04/27/14 1828 04/27/14 1831 04/28/14 0533  WBC 9.3  --  10.1  HGB 8.8* 8.8* 9.0*  HCT 27.5* 26.0* 28.2*  PLT 95*  --  100*   BMET:  Recent Labs  04/27/14 0400  04/27/14 1831 04/28/14 0533  NA 140  --  136 135  K 4.3  --  4.1 3.9  CL 106  --  102 104  CO2 27  --   --  28  GLUCOSE 138*  --  134* 156*  BUN 14  --  20 25*  CREATININE 0.84  < > 1.10 1.42*  CALCIUM 7.8*  --   --  8.0*  < > = values in this interval not displayed.  PT/INR:  Recent Labs  04/26/14 1430  LABPROT 17.8*  INR 1.46   ABG    Component Value Date/Time   PHART 7.390 04/27/2014  0947   HCO3 23.0 04/27/2014 0947   TCO2 22 04/27/2014 1831   ACIDBASEDEF 2.0 04/27/2014 0947   O2SAT 94.0 04/27/2014 0947   CBG (last 3)   Recent Labs  04/27/14 2358 04/28/14 0441 04/28/14 0808  GLUCAP 158* 156* 83   CXR: bibasilar atelectasis  Assessment/Plan: S/P Procedure(s) (LRB): AORTIC VALVE REPLACEMENT (AVR) (N/A) MAZE (N/A) TRANSESOPHAGEAL ECHOCARDIOGRAM (TEE) (N/A)  He is hemodynamically stable Rhythm looks junctional in the 80's. He is not a good coumadin candidate but may be a candidate for Eliquis at discharge. Mobilize, IS Diuresis: creatinine is up a little postop. Follow up tomorrow. Diabetes control Plan for transfer to step-down: see transfer orders   LOS: 10 days    Phylisha Dix K 04/28/2014

## 2014-04-29 LAB — BASIC METABOLIC PANEL
Anion gap: 4 — ABNORMAL LOW (ref 5–15)
BUN: 25 mg/dL — ABNORMAL HIGH (ref 6–23)
CO2: 30 mmol/L (ref 19–32)
CREATININE: 1.13 mg/dL (ref 0.50–1.35)
Calcium: 8.1 mg/dL — ABNORMAL LOW (ref 8.4–10.5)
Chloride: 102 mmol/L (ref 96–112)
GFR calc Af Amer: 74 mL/min — ABNORMAL LOW (ref >90)
GFR, EST NON AFRICAN AMERICAN: 64 mL/min — AB (ref >90)
Glucose, Bld: 99 mg/dL (ref 70–99)
POTASSIUM: 4.2 mmol/L (ref 3.5–5.1)
Sodium: 136 mmol/L (ref 135–145)

## 2014-04-29 LAB — TYPE AND SCREEN
ABO/RH(D): O POS
ANTIBODY SCREEN: NEGATIVE
Unit division: 0
Unit division: 0
Unit division: 0
Unit division: 0

## 2014-04-29 LAB — CBC
HCT: 27 % — ABNORMAL LOW (ref 39.0–52.0)
HEMOGLOBIN: 8.7 g/dL — AB (ref 13.0–17.0)
MCH: 29.4 pg (ref 26.0–34.0)
MCHC: 32.2 g/dL (ref 30.0–36.0)
MCV: 91.2 fL (ref 78.0–100.0)
Platelets: 118 10*3/uL — ABNORMAL LOW (ref 150–400)
RBC: 2.96 MIL/uL — ABNORMAL LOW (ref 4.22–5.81)
RDW: 13.9 % (ref 11.5–15.5)
WBC: 11.2 10*3/uL — ABNORMAL HIGH (ref 4.0–10.5)

## 2014-04-29 LAB — GLUCOSE, CAPILLARY
GLUCOSE-CAPILLARY: 170 mg/dL — AB (ref 70–99)
Glucose-Capillary: 78 mg/dL (ref 70–99)
Glucose-Capillary: 153 mg/dL — ABNORMAL HIGH (ref 70–99)
Glucose-Capillary: 99 mg/dL (ref 70–99)
Glucose-Capillary: 83 mg/dL (ref 70–99)

## 2014-04-29 MED ORDER — OXYCODONE HCL 5 MG PO TABS
5.0000 mg | ORAL_TABLET | ORAL | Status: DC | PRN
  Administered 2014-04-29 – 2014-04-30 (×4): 10 mg via ORAL
  Administered 2014-05-01 – 2014-05-02 (×5): 15 mg via ORAL
  Filled 2014-04-29: qty 3
  Filled 2014-04-29 (×4): qty 2
  Filled 2014-04-29 (×4): qty 3

## 2014-04-29 MED ORDER — OXYCODONE HCL ER 20 MG PO T12A: 30.0000 mg | EXTENDED_RELEASE_TABLET | Freq: Two times a day (BID) | ORAL | Status: DC

## 2014-04-29 MED ORDER — OXYCODONE HCL ER 10 MG PO T12A
30.0000 mg | EXTENDED_RELEASE_TABLET | Freq: Two times a day (BID) | ORAL | Status: DC
  Administered 2014-04-29 – 2014-05-02 (×7): 30 mg via ORAL
  Filled 2014-04-29 (×7): qty 3

## 2014-04-29 MED FILL — Heparin Sodium (Porcine) Inj 1000 Unit/ML: INTRAMUSCULAR | Qty: 30 | Status: AC

## 2014-04-29 MED FILL — Magnesium Sulfate Inj 50%: INTRAMUSCULAR | Qty: 10 | Status: AC

## 2014-04-29 MED FILL — Potassium Chloride Inj 2 mEq/ML: INTRAVENOUS | Qty: 40 | Status: AC

## 2014-04-29 NOTE — Clinical Social Work Psychosocial (Signed)
Clinical Social Work Department BRIEF PSYCHOSOCIAL ASSESSMENT 04/29/2014  Patient:  Peter Weiss,Peter Weiss     Account Number:  [id number]     Admit date:  04/18/2014  Clinical Social Worker:  Peter Weiss,Peter Weiss, CLINICAL SOCIAL WORKER  Date/Time:  04/29/2014 03:11 PM  Referred by:  Physician  Date Referred:  04/29/2014 Referred for  SNF Placement   Other Referral:   Interview type:  Patient Other interview type:    PSYCHOSOCIAL DATA Living Status:  OTHER Admitted from facility:   Level of care:   Primary support name:  Peter Weiss Primary support relationship to patient:  FRIEND Degree of support available:   patient states that he has high level of support from his friend Peter MaduroRobert who lives with him- pt also states that he has a son who is flying in from [place] to help take care of him.    CURRENT CONCERNS Current Concerns  Post-Acute Placement   Other Concerns:    SOCIAL WORK ASSESSMENT / PLAN CSW spoke with patient at bedside to discuss PT Weiss for SNF- patient is not agreeable to SNF placement and states that he has too much going on at home that he needs to take care of.  Patient states that his son is flying in from [place] this week to stay with him while he recovers.   Assessment/plan status:  Psychosocial Support/Ongoing Assessment of Needs Other assessment/ plan:   none   Information/referral to community resources:    PATIENT'S/FAMILY'S RESPONSE TO PLAN OF CARE: Patient is not agreeable to [facility] Weiss for SNF due to his current lack of mobility- patient is ready to get back to his friend who has cancer and who is staying with him and his two dogs.       Peter LotJenna Weiss, LCSWA Clinical Social Worker [phone]

## 2014-04-29 NOTE — Progress Notes (Signed)
CARDIAC REHAB PHASE I   PRE:  Rate/Rhythm: 82 JR  BP:  Supine:   Sitting: 121/69  Standing:    SaO2: 91 RA  MODE:  Ambulation: 112 ft   POST:  Rate/Rhythm: 111  BP:  Supine:   Sitting: 140/71  Standing:    SaO2: 93 RA 0850-0930 On arrival pt in recliner. I was unable to get pt to standing position with his help. I went out to get assistance and on my return to room, pt was standing.He states that he did not put much weight on his arms to get up. Assisted X 1 and used walker to ambulate. Pt c/o of left knee pain and is slightly unsteady. Would recommend Physical Therapy consult to help with strengthening and also to assess discharge needs. He was only able to walk 112 feet. Pt back to recliner after walk with call light in reach.  Melina CopaLisa Emmery Seiler RN 04/29/2014 9:26 AM

## 2014-04-29 NOTE — Evaluation (Signed)
Physical Therapy Re-Evaluation Patient Details Name: Peter Weiss MRN: [id number] DOB: 12-17-1940 Today's Date: 04/29/2014   History of Present Illness  Peter Weiss is a 74 y.o. male presents with a syncopal episode. S/p AVR 04/26/14.  Clinical Impression  Patient is seen for PT re-evaluation following the above surgery resulting in functional limitations due to the deficits listed below (see PT Problem List). Prior to operation patient required supervision to min guard assist for mobility. Post-op patient currently requires mod assist for transfers with frequent cues to safely maintain sternal precautions. Ambulates very slowly up to 150 feet with min guard assist. Pt agrees he would benefit from short term SNF for further rehab prior to returning home as he has decreased caregiver support at home. Patient will benefit from skilled PT to increase their independence and safety with mobility to allow discharge to the venue listed below.       Follow Up Recommendations SNF    Equipment Recommendations  3in1 (PT)    Recommendations for Other Services OT consult     Precautions / Restrictions Precautions Precautions: Fall;Sternal Precaution Comments: Reviewed sternal precaution handout Restrictions Weight Bearing Restrictions: Yes Other Position/Activity Restrictions: Sternal      Mobility  Bed Mobility               General bed mobility comments: Pt up in chair when PT arrived  Transfers Overall transfer level: Needs assistance Equipment used: None Transfers: Sit to/from Stand Sit to Stand: Mod assist         General transfer comment: Mod assist for boost to stand from reclining chair. Requires extra time to scoot forward, and max cues to maintain sternal precautions. VC for technique.  Ambulation/Gait Ambulation/Gait assistance: Min guard Ambulation Distance (Feet): 150 Feet Assistive device: Rolling walker (2 wheeled) Gait Pattern/deviations:  Step-through pattern;Decreased stride length;Trunk flexed;Antalgic Gait velocity: decreased   General Gait Details: Very slow gait speed. Cues for increased stride length and upright posture throughout ambulatory bout. Mildy antalgic and complains of Lt knee pain frequently in weight bearing.  Stairs            Wheelchair Mobility    Modified Rankin (Stroke Patients Only)       Balance Overall balance assessment: Needs assistance Sitting-balance support: No upper extremity supported;Feet supported Sitting balance-Leahy Scale: Good     Standing balance support: No upper extremity supported;During functional activity Standing balance-Leahy Scale: Fair Standing balance comment: Briefly while preparing to sit, and initially upon standing.                             Pertinent Vitals/Pain Pain Assessment: 0-10 Pain Score: 5  Pain Location: Lt knee Pain Descriptors / Indicators: Aching Pain Intervention(s): Monitored during session;Repositioned  HR 80 at rest, 128 ambulating. SpO2 93% and higher on room air.    Home Living Family/patient expects to be discharged to:: Unsure Living Arrangements: Non-relatives/Friends Available Help at Discharge: Friend(s) Type of Home: Mobile home Home Access: Stairs to enter Entrance Stairs-Rails: Can reach both;Right;Left Entrance Stairs-Number of Steps: 5 Home Layout: One level Home Equipment: Walker - 2 wheels;Cane - single point      Prior Function Level of Independence: Independent with assistive device(s)               Hand Dominance   Dominant Hand: Right    Extremity/Trunk Assessment   Upper Extremity Assessment: Defer to OT evaluation  Lower Extremity Assessment: Generalized weakness         Communication   Communication: No difficulties  Cognition Arousal/Alertness: Awake/alert Behavior During Therapy: WFL for tasks assessed/performed Overall Cognitive Status: No  family/caregiver present to determine baseline cognitive functioning                      General Comments General comments (skin integrity, edema, etc.): States knee pain started approx 1 year ago. Also reports that he does not have electricty at home and takes care of his dogs. His roommate supposedly has terminal bone cancer.    Exercises General Exercises - Lower Extremity Long Arc Quad: AROM;Strengthening;Both;10 reps;Seated      Assessment/Plan    PT Assessment Patient needs continued PT services  PT Diagnosis Difficulty walking;Acute pain;Abnormality of gait;Generalized weakness   PT Problem List Decreased activity tolerance;Decreased strength;Decreased mobility;Decreased knowledge of use of DME;Decreased safety awareness;Decreased knowledge of precautions;Decreased cognition;Decreased balance;Cardiopulmonary status limiting activity;Obesity;Pain  PT Treatment Interventions DME instruction;Gait training;Stair training;Functional mobility training;Therapeutic activities;Therapeutic exercise;Balance training;Neuromuscular re-education;Cognitive remediation;Patient/family education;Modalities   PT Goals (Current goals can be found in the Care Plan section) Acute Rehab PT Goals Patient Stated Goal: none stated PT Goal Formulation: With patient Time For Goal Achievement: 05/13/14 Potential to Achieve Goals: Good    Frequency Min 3X/week   Barriers to discharge Decreased caregiver support;Inaccessible home environment lives with roommate who has terminal cancer. Pt does not have electricity in his home.    Co-evaluation               End of Session Equipment Utilized During Treatment: Gait belt Activity Tolerance: Patient tolerated treatment well Patient left: in chair;with call bell/phone within reach Nurse Communication: Mobility status         Time: [id number] PT Time Calculation (min) (ACUTE ONLY): 40 min   Charges:   PT Evaluation $PT Re-evaluation:  1 Procedure PT Treatments $Gait Training: 8-22 mins $Therapeutic Activity: 8-22 mins   PT G Codes:        Berton [place]bour, Ardean Simonich S 04/29/2014, 4:13 PM Sunday SpillersLogan Secor Gordo[place]bour, [place] [phone]

## 2014-04-29 NOTE — Progress Notes (Signed)
Medicare Important Message given? YES  (If response is "NO", the following Medicare IM given date fields will be blank)  Date Medicare IM given: 04/29/14 Medicare IM given by:  Geovanie Winnett  

## 2014-04-29 NOTE — Progress Notes (Addendum)
[address]       [place] [address]             [phone]      3 Days Post-Op Procedure(s) (LRB): AORTIC VALVE REPLACEMENT (AVR) (N/A) MAZE (N/A) TRANSESOPHAGEAL ECHOCARDIOGRAM (TEE) (N/A)   Subjective:  Mr. Albea complains of left knee pain.  He states this is keeping him from ambulating.  He also complains of urinating difficulties since his foley catheter was removed.  Spoke with nurse who states the patient voided about 75 cc however there was a large clot and urine on the towel under the patient.  He does have some hematuria present.    Objective: Vital signs in last 24 hours: Temp:  [97.4 F (36.3 C)-98.3 F (36.8 C)] 98.3 F (36.8 C) (02/15 0359) Pulse Rate:  [48-89] 82 (02/15 0359) Cardiac Rhythm:  [-] Normal sinus rhythm;Bundle branch block (02/14 2025) Resp:  [14-20] 18 (02/15 0359) BP: (113-154)/(61-134) 127/72 mmHg (02/15 0359) SpO2:  [90 %-100 %] 91 % (02/15 0359) Weight:  [264 lb (119.75 kg)] 264 lb (119.75 kg) (02/15 0359)  Intake/Output from previous day: 02/14 0701 - 02/15 0700 In: 526 [P.O.:476; I.V.:50] Out: 2001 [Urine:2000; Stool:1]  General appearance: alert, cooperative and no distress Heart: regular rate and rhythm Lungs: diminished breath sounds Abdomen: soft, non-tender; bowel sounds normal; no masses,  no organomegaly Extremities: edema trace, Left foot with mottled appearance per patient not new Wound: clean and dry  Lab Results:  Recent Labs  04/28/14 0533 04/29/14 0545  WBC 10.1 11.2*  HGB 9.0* 8.7*  HCT 28.2* 27.0*  PLT 100* 118*   BMET:  Recent Labs  04/28/14 0533 04/29/14 0545  NA 135 136  K 3.9 4.2  CL 104 102  CO2 28 30  GLUCOSE 156* 99  BUN 25* 25*  CREATININE 1.42* 1.13  CALCIUM 8.0* 8.1*    PT/INR:  Recent Labs  04/26/14 1430  LABPROT 17.8*  INR 1.46   ABG    Component Value Date/Time   PHART 7.390 04/27/2014 0947   HCO3 23.0 04/27/2014 0947   TCO2 22 04/27/2014 1831   ACIDBASEDEF 2.0 04/27/2014 0947   O2SAT 94.0 04/27/2014 0947   CBG (last 3)   Recent Labs  04/28/14 1150 04/28/14 1616 04/28/14 2110  GLUCAP 87 141* 117*    Assessment/Plan: S/P Procedure(s) (LRB): AORTIC VALVE REPLACEMENT (AVR) (N/A) MAZE (N/A) TRANSESOPHAGEAL ECHOCARDIOGRAM (TEE) (N/A)  1. CV- NSR rate and pressure control- not currently on anti hypertensive medications or beta blocker 2. Pulm- off oxygen, small bilateral effusions continue IS 3. Renal- creatinine is at baseline, foley removed overnight, patient with urinary frequency and little output, + hematuria-continue Flomax and monitor output, per patient has been incontinent for about a year when standing, + hypervolemia continue Lasix 4. Left Knee pain- exam benign, will place heat pack on leg, patient takes 30 mg Oxy at home every 6 hrs prn, may benefit to increase pain medication vs starting NSAID- again not an acute issue patient state has been present for a year 5. Deconditioning- patient would likely benefit from SNF placement however patient declines due to dogs at home, will get PT consult for possible home health PT 6. Dispo- patient stable, left knee pain hindering ambulation, urinary frequency/hematuria continue flomax and monitor   LOS: 11 days    BARRETT, ERIN 04/29/2014  Patient seen and examined, agree with above Pain control is an issue- was taking about 120 mg of oxycodone daily  at home- he is getting about 60 a day here Will start oxycontin 30 mg q 12 and change prn to 5-15 mg q4

## 2014-04-30 ENCOUNTER — Encounter (HOSPITAL_COMMUNITY): Payer: Self-pay | Admitting: Thoracic Surgery (Cardiothoracic Vascular Surgery)

## 2014-04-30 LAB — GLUCOSE, CAPILLARY
GLUCOSE-CAPILLARY: 140 mg/dL — AB (ref 70–99)
GLUCOSE-CAPILLARY: 96 mg/dL (ref 70–99)
Glucose-Capillary: 63 mg/dL — ABNORMAL LOW (ref 70–99)
Glucose-Capillary: 80 mg/dL (ref 70–99)
Glucose-Capillary: 106 mg/dL — ABNORMAL HIGH (ref 70–99)

## 2014-04-30 MED ORDER — FUROSEMIDE 40 MG PO TABS
40.0000 mg | ORAL_TABLET | Freq: Every day | ORAL | Status: DC
  Administered 2014-04-30 – 2014-05-02 (×2): 40 mg via ORAL
  Filled 2014-04-30 (×3): qty 1

## 2014-04-30 MED ORDER — DONEPEZIL HCL 10 MG PO TABS
10.0000 mg | ORAL_TABLET | Freq: Every day | ORAL | Status: DC
  Administered 2014-04-30 – 2014-05-01 (×2): 10 mg via ORAL
  Filled 2014-04-30 (×3): qty 1

## 2014-04-30 NOTE — Clinical Social Work Note (Signed)
CSW informed that patient is agreeable to SNF at this time.  CSW went to speak with patient who states that he is not sure if he is willing to go to SNF- states that [place] from [place] coming in on Friday and he has not seen this son in 5 years- [name].  CSW provided active listening and discussed further with patient- patient is still unsure about going to SNF and would like to speak with his son first- CSW located son Peter Haggard(Davids) number from security in ED where pt belongings are being kept.  Patient to call son and discuss options.  CSW will continue to follow.  Merlyn LotJenna Holoman, LCSWA Clinical Social Worker [phone]

## 2014-04-30 NOTE — Progress Notes (Addendum)
EPW removed per order. Ventricular wire pulled successfully. Blood noted on end of wire with intact end.  One of the two atrial wires was removed with end intact. PA paged to remove remaining atrial wire as 2 RNs were unable to do so. VSS. Pt instructed of one hour bedrest. Verbalized understanding. Will continue to monitor pt cloesly.

## 2014-04-30 NOTE — Progress Notes (Signed)
PA at bedside to pull remaining wire. End intact. Tolerated procedure well. Will continue to monitor pt closely.

## 2014-04-30 NOTE — Progress Notes (Signed)
CARDIAC REHAB PHASE I   PRE:  Rate/Rhythm: 84 JR    BP: in BR    SaO2: wouldn't register  MODE:  Ambulation: 150 ft   POST:  Rate/Rhythm: 107     BP: sitting 158/89     SaO2: 93 RA  [facility] Able to walk with RW around circle, left knee bothering him with distance. To bed. Needed max assist x2 to get in bed, pt has no control. Lifted his legs for him. Will continued to follow. [phone]   Harriet Massoneeve, Maximina Pirozzi Kristan CES, ACSM 04/30/2014 9:04 AM

## 2014-04-30 NOTE — Evaluation (Signed)
Occupational Therapy Evaluation Patient Details Name: Peter Modenahomas Patrick Siple MRN: [id number] DOB: [birth year] Today's Date: 04/30/2014    History of Present Illness Peter Weiss is a 74 y.o. male presents with a syncopal episode. S/p AVR 04/26/14.   Clinical Impression   Pt seen for re-evaluation following surgery.  Presents with difficulty rising from sitting as he was heavily reliant on his UEs prior to surgery due to painful knees and now has sternal precautions in place. Pt also fatiguing with greater ease.  Pt has limited and unreliable assistance at home. He has baseline memory and judgement issues of which he is aware. Both sons live out of the country, but one is visiting later this week.  Educated pt in importance of discussing long term plans with son and the value of short term rehab prior to return home.  Pt to consider recommendations and talk with son. Will follow acutely.    Follow Up Recommendations  SNF;Supervision/Assistance - 24 hour    Equipment Recommendations  3 in 1 bedside comode    Recommendations for Other Services       Precautions / Restrictions Precautions Precautions: Fall;Sternal Precaution Comments: reviewed sternal precautions, pt not able to recall any Restrictions Weight Bearing Restrictions: Yes Other Position/Activity Restrictions: Sternal      Mobility Bed Mobility               General bed mobility comments: not assessed  Transfers Overall transfer level: Needs assistance Equipment used: Rolling walker (2 wheeled) Transfers: Sit to/from Stand Sit to Stand: Mod assist;Min assist (min from commode, mod from chair)         General transfer comment: max cues for sternal precautions, mod assist to rise from sitting and gain stability    Balance     Sitting balance-Leahy Scale: Good       Standing balance-Leahy Scale: Fair                              ADL Overall ADL's : Needs  assistance/impaired Eating/Feeding: Independent;Sitting   Grooming: Wash/dry hands;Wash/dry face;Set up;Brushing hair;Sitting   Upper Body Bathing: Minimal assitance;Sitting   Lower Body Bathing: Moderate assistance;Sit to/from stand   Upper Body Dressing : Minimal assistance;Sitting   Lower Body Dressing: Moderate assistance;Sit to/from stand   Toilet Transfer: Ambulation;BSC;RW;Minimal assistance (BSC over toilet)   Toileting- Clothing Manipulation and Hygiene: Moderate assistance;Sit to/from Nurse, children'sstand     Tub/Shower Transfer Details (indicate cue type and reason): pt stated his shower at home would not accommodate a seat Functional mobility during ADLs: Minimal assistance;Rolling walker General ADL Comments: Able to cross foot over opposite knee to donn and doff socks.     Vision     Perception     Praxis      Pertinent Vitals/Pain Pain Assessment: Faces Faces Pain Scale: Hurts little more Pain Location: L knee--chronic Pain Descriptors / Indicators: Aching Pain Intervention(s): Limited activity within patient's tolerance;Monitored during session;Repositioned     Hand Dominance Right   Extremity/Trunk Assessment Upper Extremity Assessment Upper Extremity Assessment: Generalized weakness   Lower Extremity Assessment Lower Extremity Assessment: Defer to PT evaluation       Communication Communication Communication: No difficulties   Cognition Arousal/Alertness: Awake/alert Behavior During Therapy: WFL for tasks assessed/performed Overall Cognitive Status: History of cognitive impairments - at baseline       Memory: Decreased short-term memory  General Comments       Exercises       Shoulder Instructions      Home Living Family/patient expects to be discharged to:: Private residence Living Arrangements: Non-relatives/Friends Available Help at Discharge: Friend(s) Type of Home: Mobile home Home Access: Stairs to enter Entrance  Stairs-Number of Steps: 5 Entrance Stairs-Rails: Can reach both;Right;Left Home Layout: One level     Bathroom Shower/Tub: Producer, television/film/video: Standard     Home Equipment: Environmental consultant - 2 wheels;Cane - single point          Prior Functioning/Environment Level of Independence: Independent with assistive device(s)        Comments: neighbor assisted with transportation    OT Diagnosis: Generalized weakness;Cognitive deficits   OT Problem List: Decreased strength;Decreased activity tolerance;Impaired balance (sitting and/or standing);Decreased cognition;Decreased coordination;Decreased safety awareness;Decreased knowledge of use of DME or AE;Obesity;Pain   OT Treatment/Interventions: Self-care/ADL training;DME and/or AE instruction;Therapeutic activities;Patient/family education;Balance training    OT Goals(Current goals can be found in the care plan section) Acute Rehab OT Goals Patient Stated Goal: return home OT Goal Formulation: With patient Time For Goal Achievement: 05/14/14 Potential to Achieve Goals: Good ADL Goals Pt Will Perform Grooming: with supervision;standing Pt Will Perform Lower Body Bathing: with supervision;sit to/from stand Pt Will Perform Lower Body Dressing: with supervision;sit to/from stand Pt Will Transfer to Toilet: with supervision;ambulating;bedside commode (over toilet) Pt Will Perform Toileting - Clothing Manipulation and hygiene: with supervision;sit to/from stand Pt Will Perform Tub/Shower Transfer: Shower transfer;with min guard assist;ambulating;3 in 1;rolling walker  OT Frequency: Min 2X/week   Barriers to D/C: Decreased caregiver support          Co-evaluation              End of Session Equipment Utilized During Treatment: Gait belt;Rolling walker  Activity Tolerance: Patient tolerated treatment well Patient left: in chair;with call bell/phone within reach   Time: 1226-1254 OT Time Calculation (min): 28  min Charges:  OT General Charges $OT Visit: 1 Procedure OT Evaluation $OT Re-eval: 1 Procedure OT Treatments $Self Care/Home Management : 8-22 mins G-Codes:    Evern Bio 04/30/2014, 1:36 PM  [phone]

## 2014-04-30 NOTE — Progress Notes (Signed)
Patient's pacing wire site bleeding. Dressing saturated and draining, gown saturated as well. Held pressure for 30 minutes until site stopped draining. New dressing applied and reinforced. Patient denied any distress at this time. Will continue to monitor.   Valinda HoarLexie Tauriel Scronce RN

## 2014-04-30 NOTE — Progress Notes (Signed)
      [address]       [place] [id number]             [phone]      4 Days Post-Op Procedure(s) (LRB): AORTIC VALVE REPLACEMENT (AVR) (N/A) MAZE (N/A) TRANSESOPHAGEAL ECHOCARDIOGRAM (TEE) (N/A)   Subjective:  [name] has same complaints of left knee pain.  He states it has improved some but he is still having difficulty walking.   Objective: Vital signs in last 24 hours: Temp:  [97.7 F (36.5 C)-98.5 F (36.9 C)] 98.5 F (36.9 C) (02/16 0438) Pulse Rate:  [85-90] 90 (02/16 0438) Cardiac Rhythm:  [-] Normal sinus rhythm;Bundle branch block (02/15 2113) Resp:  [18-19] 18 (02/16 0438) BP: (112-124)/(41-88) 123/88 mmHg (02/16 0438) SpO2:  [91 %-98 %] 93 % (02/16 0438) Weight:  [267 lb 6.7 oz (121.3 kg)] 267 lb 6.7 oz (121.3 kg) (02/16 0438)  Intake/Output from previous day: 02/15 0701 - 02/16 0700 In: 962 [P.O.:962] Out: 1376 [Urine:1375; Stool:1]  General appearance: alert, cooperative and no distress Heart: regular rate and rhythm Lungs: diminished breath sounds bibasilar Abdomen: soft, non-tender; bowel sounds normal; no masses,  no organomegaly Extremities: edema trace, left foot remains mottled in appearance Wound: clean and dry  Lab Results:  Recent Labs  04/28/14 0533 04/29/14 0545  WBC 10.1 11.2*  HGB 9.0* 8.7*  HCT 28.2* 27.0*  PLT 100* 118*   BMET:  Recent Labs  04/28/14 0533 04/29/14 0545  NA 135 136  K 3.9 4.2  CL 104 102  CO2 28 30  GLUCOSE 156* 99  BUN 25* 25*  CREATININE 1.42* 1.13  CALCIUM 8.0* 8.1*    PT/INR: No results for input(s): LABPROT, INR in the last 72 hours. ABG    Component Value Date/Time   PHART 7.390 04/27/2014 0947   HCO3 23.0 04/27/2014 0947   TCO2 22 04/27/2014 1831   ACIDBASEDEF 2.0 04/27/2014 0947   O2SAT 94.0 04/27/2014 0947   CBG (last 3)   Recent Labs  04/29/14 2115 04/30/14 0559 04/30/14 0616  GLUCAP 83 63* 80    Assessment/Plan: S/P Procedure(s) (LRB): AORTIC VALVE  REPLACEMENT (AVR) (N/A) MAZE (N/A) TRANSESOPHAGEAL ECHOCARDIOGRAM (TEE) (N/A)  1. CV- NSR mildly tachycardic, pressure controlled- not current on medications 2. Pulm- off oxygen, continue IS 3. Renal- remains hypervolemic, continue lasix 4. Left Knee Pain- stable, continue home pain medication regimen 5. Deconditioning- patient agreeable to SNF placement 6. Dispo- patient stable, D/C EPW, work on SNF placement   LOS: 12 days    Raford PitcherBARRETT, Denny PeonRIN 04/30/2014

## 2014-05-01 LAB — GLUCOSE, CAPILLARY
GLUCOSE-CAPILLARY: 134 mg/dL — AB (ref 70–99)
Glucose-Capillary: 105 mg/dL — ABNORMAL HIGH (ref 70–99)
Glucose-Capillary: 120 mg/dL — ABNORMAL HIGH (ref 70–99)

## 2014-05-01 MED ORDER — GLUCERNA SHAKE PO LIQD
237.0000 mL | Freq: Two times a day (BID) | ORAL | Status: DC
  Administered 2014-05-02: 237 mL via ORAL

## 2014-05-01 MED ORDER — FUROSEMIDE 10 MG/ML IJ SOLN
40.0000 mg | Freq: Once | INTRAMUSCULAR | Status: AC
  Administered 2014-05-01: 40 mg via INTRAVENOUS
  Filled 2014-05-01: qty 4

## 2014-05-01 MED ORDER — METOPROLOL TARTRATE 12.5 MG HALF TABLET
12.5000 mg | ORAL_TABLET | Freq: Two times a day (BID) | ORAL | Status: DC
  Administered 2014-05-01 – 2014-05-02 (×3): 12.5 mg via ORAL
  Filled 2014-05-01 (×5): qty 1

## 2014-05-01 NOTE — Progress Notes (Addendum)
Note the development of AF. Likely related to surgery and will be self limiting. If it continues will need anticoagulation consideration unless there are issues that make him high risk for bleeding. Get 12 lead ECG.

## 2014-05-01 NOTE — Progress Notes (Signed)
Physical Therapy Treatment Patient Details Name: Peter Weiss MRN: [id number] DOB: May 18, 1940 Today's Date: 05/01/2014    History of Present Illness Peter Weiss is a 74 y.o. male presents with a syncopal episode. S/p AVR 04/26/14.    PT Comments    Patient limited by confusion today, needing frequent cues for redirection of task at hand. Asking frequently for his "tubes of medicine," and if "[name] from the mobile home [facility]" was outside of his room. Refuses to ambulate outside of room but tolerated transfer training with mod assist and MAX cues to maintain sternal precautions, which he began to argue about towards end of therapy. RN notified of notable change in status from previous therapy session. Patient will continue to benefit from skilled physical therapy services to further improve independence with functional mobility.   Follow Up Recommendations  SNF     Equipment Recommendations  3in1 (PT)    Recommendations for Other Services OT consult     Precautions / Restrictions Precautions Precautions: Fall;Sternal Precaution Comments: Reviewed sternal precaution handout Restrictions Weight Bearing Restrictions: Yes Other Position/Activity Restrictions: Sternal    Mobility  Bed Mobility               General bed mobility comments: Pt up in chair when PT arrived  Transfers Overall transfer level: Needs assistance Equipment used: None Transfers: Sit to/from Stand Sit to Stand: Mod assist         General transfer comment: Mod assist for boost to stand from recliner and elevated bed surface. Max cues to prevent use of UEs however pt continues to disregard warnings for sternal precautions.   Ambulation/Gait Ambulation/Gait assistance: Min guard Ambulation Distance (Feet): 15 Feet (x3) Assistive device: Rolling walker (2 wheeled) Gait Pattern/deviations: Step-through pattern;Decreased stride length;Trunk flexed Gait velocity: decreased   General  Gait Details: Frequent cues to keep walker with pt as he pushes it to the side while attempting to perform secondary tasks. Hr 80s-90s. Mild sway noted. Needs frequent redirection for task at hand due to confusion. Unwilling to ambulate further distance out of room.   Stairs            Wheelchair Mobility    Modified Rankin (Stroke Patients Only)       Balance               Standing balance comment: Stood at edge of bed looking through his bags of "tubes," redirected and able to stand at sink for 5 minutes to perform hygeine without physical assist and cues for safety.                    Cognition Arousal/Alertness: Awake/alert Behavior During Therapy: WFL for tasks assessed/performed Overall Cognitive Status: Impaired/Different from baseline Area of Impairment: Memory;Safety/judgement;Problem solving     Memory: Decreased recall of precautions;Decreased short-term memory   Safety/Judgement: Decreased awareness of safety   Problem Solving: Slow processing;Requires verbal cues General Comments: Pt with increased confusion from previous therapy session.     Exercises      General Comments General comments (skin integrity, edema, etc.): Pt perseverating on asking if "[name] from the mobile home [facility]" was outside of his room. Also asking for "tubes of medicine." With increased confusion today from previous therapy session. Rn notified. Needed frequent redirection throught therapy task for safety.      Pertinent Vitals/Pain Pain Assessment: No/denies pain Faces Pain Scale: Hurts little more Pain Location: chest Pain Descriptors / Indicators: Aching;Sore Pain Intervention(s): Limited activity within  patient's tolerance;Monitored during session;Repositioned;Patient requesting pain meds-RN notified    Home Living                      Prior Function            PT Goals (current goals can now be found in the care plan section) Acute Rehab PT  Goals Patient Stated Goal: none stated PT Goal Formulation: With patient Time For Goal Achievement: 05/13/14 Potential to Achieve Goals: Good Progress towards PT goals: Progressing toward goals    Frequency  Min 3X/week    PT Plan Current plan remains appropriate    Co-evaluation             End of Session Equipment Utilized During Treatment: Gait belt Activity Tolerance: Other (comment) (Pt with confusion, needed frequent redirection for tasks) Patient left: in chair;with call bell/phone within reach;with chair alarm set     Time: 1610-9604 PT Time Calculation (min) (ACUTE ONLY): 23 min  Charges:  $Therapeutic Activity: 23-37 mins                    G CodesBerton Mount 05/20/14, 5:02 PM Sunday [place], [place] [phone]

## 2014-05-01 NOTE — Progress Notes (Signed)
      [address]       [place] [id number]             [phone]      5 Days Post-Op Procedure(s) (LRB): AORTIC VALVE REPLACEMENT (AVR) (N/A) MAZE (N/A) TRANSESOPHAGEAL ECHOCARDIOGRAM (TEE) (N/A)   Subjective:  [name] continues to have complaints of leg pain and now sternal discomfort. + ambulation with walker + BM  Objective: Vital signs in last 24 hours: Temp:  [97.9 F (36.6 C)-98.5 F (36.9 C)] 98.5 F (36.9 C) (02/17 0506) Pulse Rate:  [74-83] 74 (02/17 0506) Cardiac Rhythm:  [-] Junctional rhythm (02/17 0814) Resp:  [16-20] 16 (02/17 0506) BP: (118-142)/(62-78) 118/62 mmHg (02/17 0506) SpO2:  [93 %-97 %] 93 % (02/17 0506) Weight:  [269 lb 10 oz (122.3 kg)] 269 lb 10 oz (122.3 kg) (02/17 0506)  Intake/Output from previous day: 02/16 0701 - 02/17 0700 In: 960 [P.O.:960] Out: 650 [Urine:650]  General appearance: alert, cooperative and no distress Heart: irregularly irregular rhythm Lungs: diminished breath sounds bibasilar Abdomen: soft, non-tender; bowel sounds normal; no masses,  no organomegaly Extremities: extremities normal, atraumatic, no cyanosis or edema Wound: clean and dry  Lab Results:  Recent Labs  04/29/14 0545  WBC 11.2*  HGB 8.7*  HCT 27.0*  PLT 118*   BMET:  Recent Labs  04/29/14 0545  NA 136  K 4.2  CL 102  CO2 30  GLUCOSE 99  BUN 25*  CREATININE 1.13  CALCIUM 8.1*    PT/INR: No results for input(s): LABPROT, INR in the last 72 hours. ABG    Component Value Date/Time   PHART 7.390 04/27/2014 0947   HCO3 23.0 04/27/2014 0947   TCO2 22 04/27/2014 1831   ACIDBASEDEF 2.0 04/27/2014 0947   O2SAT 94.0 04/27/2014 0947   CBG (last 3)   Recent Labs  04/30/14 1728 04/30/14 2200 05/01/14 0617  GLUCAP 96 106* 134*    Assessment/Plan: S/P Procedure(s) (LRB): AORTIC VALVE REPLACEMENT (AVR) (N/A) MAZE (N/A) TRANSESOPHAGEAL ECHOCARDIOGRAM (TEE) (N/A)  1. CV- In/out of rate controlled A. Fib- not  currently on rate controlling medications- will start low dose beta blocker, will discuss use of oral amiodarone with staff 2. Pulm- not on oxygen, continue IS 3. Renal- weight has been trending up past several days, on oral Lasix, will give IV dose today 4. Deconditioning- patient spoke with social work yesterday.  He wants to go home because son from [place] is coming to help him.  He should be here Saturday and is staying for 10 days.  Should this fall through patient is agreeable to SNF.  Will arrange Home Health 5. Dispo- patient in and out of A. Fib overnight will start low dose beta blocker, diurese due to increasing weight and volume status, arrange home health, however patient will not have assistance available until Saturday   LOS: 13 days    BARRETT, ERIN 05/01/2014

## 2014-05-01 NOTE — Consult Note (Deleted)
Physician Discharge Summary  Patient ID: Peter Weiss MRN: [id number] DOB/AGE: 72-Sep-1942 72 y.o.  Admit date: 04/18/2014 Discharge date: 05/01/2014  Admission Diagnoses:  Patient Active Problem List   Diagnosis Date Noted  . Acute kidney injury   . Aortic stenosis   . Atrial fibrillation, unspecified   . Dental caries   . Pyrexia   . Aortic valve stenosis, critical 04/20/2014  . Syncope and collapse 04/18/2014  . Dehydration 04/18/2014  . Hypotension 04/18/2014  . Syncope 04/18/2014  . Arterial hypotension 03/25/2014  . Lethargic 03/25/2014  . Adjustment disorder with mixed anxiety and depressed mood 03/25/2014  . Chronic back pain 09/28/2013  . Mild cognitive impairment 09/28/2013  . DM2 (diabetes mellitus, type 2) 09/28/2013  . HLD (hyperlipidemia) 09/28/2013  . HTN (hypertension) 09/28/2013  . GERD (gastroesophageal reflux disease) 09/28/2013   Discharge Diagnoses:   Patient Active Problem List   Diagnosis Date Noted  . S/P AVR 04/26/2014  . Acute kidney injury   . Aortic stenosis   . Atrial fibrillation, unspecified   . Dental caries   . Pyrexia   . Aortic valve stenosis, critical 04/20/2014  . Syncope and collapse 04/18/2014  . Dehydration 04/18/2014  . Hypotension 04/18/2014  . Syncope 04/18/2014  . Arterial hypotension 03/25/2014  . Lethargic 03/25/2014  . Adjustment disorder with mixed anxiety and depressed mood 03/25/2014  . Chronic back pain 09/28/2013  . Mild cognitive impairment 09/28/2013  . DM2 (diabetes mellitus, type 2) 09/28/2013  . HLD (hyperlipidemia) 09/28/2013  . HTN (hypertension) 09/28/2013  . GERD (gastroesophageal reflux disease) 09/28/2013   Discharged Condition: good  History of Present Illness:   [name] is a 74 yo obese white male with multiple medicals problems including known AS.  The patient presented to the ED after he suffered a fall and "blacked out."  Workup revealed the patient to have an altered mental status,  mild fever at 100.4, elevated Troponin, Hypotensive, and EKG revealed Atrial Fibrillation.  V/Q scan and D-Dimer were also obtained and were WNL.  He was admitted by the medicine service for further workup.     Hospital Course:   The patient showed no evidence of acute infection despite fever and was no placed on IV ABX.  He underwent Echocardiogram which confirmed severe Aortic Stenosis.  He had undergone cardiac catheterization in January of this year which did not reveal evidence of significant coronary disease.  Due to his Aortic Stenosis TCTS was consulted.  He was evaluated by Dr. Dorris Fetch on 04/20/2014.  He was in agreement the patient would need aortic valve replacement.  However the patient had poor dentition and would require intervention from OMFS prior to proceeding to the operating room.  He was taken to the operating room and underwent extractions of multiple teeth, alveoloplasty with 3 quadrants, debridement of remaining dentition on 04/24/2014.  The patient remained stable post procedure.  He did however convert back into Atrial Fibrillation.  He was again taken to the operating room on 04/26/2014.  He underwent AVR with a 27mm [employer] Perimount Pericardial Tissue Valve, Left and Right MAZE procedure, and Clipping of LA Appendage.  The patient tolerated the procedure well and was taken to the SICU in stable condition.  During his stay in the SICU the patient was uncooperative with weaning from the ventilator.  He wanted to wait until the MD was present.  He was able to be extubated on POD #1.  He was weaned off Dopamine as tolerated.  He required Atrial pacing due to junctional bradycardia.  This ultimately improved with rates maintained in the 80s.  His chest tubes and arterial lines were removed without difficulty.  He was felt medically stable for transfer to the step down unit on POD #2.  The patient continued to make progress.  He was maintaining NSR and his pacing wires  were removed.  He developed some intermittent Atrial Fibrillation and he was started on low dose Beta Blocker which was tolerated without difficulty.  He was also started on Eliquis. He was not able to ambulate much due to chronic pain in his left knee.  He uses high dose narcotics at home and his regimen was adjusted to his home dose.  The patient had some hematuria which was most likely due to trauma from his foley catheter.  He has been having issues with incontinence for the past year and would probably benefit from evaluation with Urology on an outpatient basis.  The patient is deconditioned and based on PT evaluation he would benefit from SNF placement.  However, patient stated his son was coming in from [place] for 10 days and would be able to assist with care.  Therefore, we have arranged home health and home PT.  He is medically stable at this time.  Should no issues arise we anticipate discharge home on 05/02/2014.          Significant Diagnostic Studies: cardiac graphics: Echocardiogram:   - Left ventricle: The cavity size was normal. Wall thickness was increased in a pattern of moderate LVH. Systolic function was normal. The estimated ejection fraction was in the range of 55% to 60%. Wall motion was normal; there were no regional wall motion abnormalities. Doppler parameters are consistent with high ventricular filling pressure. - Aortic valve: Valve mobility was restricted. There was severe stenosis. There was trivial regurgitation. Valve area (VTI): 0.56 cm^2. Valve area (Vmax): 0.61 cm^2. Valve area (Vmean): 0.56 cm^2. - Aortic root: The aortic root was moderately dilated. - Mitral valve: Calcified annulus. - Left atrium: The atrium was severely dilated. - Right atrium: The atrium was mildly dilated.  Treatments: surgery:   1. Aortic valve replacement with 27 mm Edwards Perimount  Bovine pericardial valve (model number 2700, serial number  [id number]). 2.  Modified-Cox IV maze procedure using radiofrequency ablation. 3. Clipping of left atrial appendage  Disposition: Home with Home Health  Discharge Medications:     Medication List    STOP taking these medications        losartan 50 MG tablet  Commonly known as:  COZAAR     metoprolol succinate 50 MG 24 hr tablet  Commonly known as:  TOPROL-XL      TAKE these medications        apixaban 2.5 MG Tabs tablet  Commonly known as:  ELIQUIS  Take 1 tablet (2.5 mg total) by mouth 2 (two) times daily.     aspirin EC 81 MG tablet  Take 4 tablets (325 mg total) by mouth daily.     donepezil 10 MG tablet  Commonly known as:  ARICEPT  Take 10 mg by mouth at bedtime.     furosemide 20 MG tablet  Commonly known as:  LASIX  Take 20 mg by mouth daily.     gabapentin 400 MG capsule  Commonly known as:  NEURONTIN  Take 400 mg by mouth 3 (three) times daily.     metoprolol tartrate 25 MG tablet  Commonly known as:  LOPRESSOR  Take 0.5 tablets (12.5 mg total) by mouth 2 (two) times daily.     multivitamin capsule  Take 1 capsule by mouth daily.     omeprazole 20 MG capsule  Commonly known as:  PRILOSEC  Take 20 mg by mouth daily.     oxycodone 30 MG immediate release tablet  Commonly known as:  ROXICODONE  Take 30 mg by mouth every 6 (six) hours as needed for pain.     Oxycodone HCl 10 MG Tabs  Take 1 tablet (10 mg total) by mouth 3 (three) times daily.     potassium chloride 10 MEQ tablet  Commonly known as:  K-DUR,KLOR-CON  Take 1 tablet (10 mEq total) by mouth daily.     simvastatin 20 MG tablet  Commonly known as:  ZOCOR  Take 20 mg by mouth daily.     tamsulosin 0.4 MG Caps capsule  Commonly known as:  FLOMAX  Take 0.4 mg by mouth daily.     venlafaxine XR 150 MG 24 hr capsule  Commonly known as:  EFFEXOR-XR  Take 150 mg by mouth daily with breakfast.         The patient has been discharged on:   1.Beta Blocker:  Yes [ x  ]                               No   [   ]                              If No, reason:  2.Ace Inhibitor/ARB: Yes [   ]                                     No  [ x   ]                                     If No, reason: labile blood pressure, renal insufficiency  3.Statin:   Yes [ x  ]                  No  [   ]                  If No, reason:  4.Ecasa:  Yes  [ x  ]                  No   [   ]                  If No, reason:  Follow-up Information    Follow up with Loreli Slot, MD On 05/28/2014.   Specialty:  Cardiothoracic Surgery   Why:  Appointment is at 10:30   Contact information:   [address] [place] [address] [phone]       Follow up with [facility] On 05/28/2014.   Why:  Please get CXR at 10:00   Contact information:   [facility]       Follow up with Julien Nordmann, MD On 05/17/2014.   Specialty:  Cardiology   Why:  Appointment is at [address] information:   [address] [place] [address] [phone]  SignedLowella Dandy 05/01/2014, 11:53 AM

## 2014-05-01 NOTE — Progress Notes (Signed)
NUTRITION FOLLOW-UP   DOCUMENTATION CODES Per approved criteria  -Obesity Unspecified   INTERVENTION: Glucerna Shake po BID, each supplement provides 220 kcal and 10 grams of protein  NUTRITION DIAGNOSIS: Inadequate oral intake related to restricted diet as evidenced by estimated needs.   Goal: Intake to meet >90% of estimated nutrition needs.  Monitor:  Diet advancement, PO intake, labs, weight trend.  Reason for Assessment: MD Consult   74 y.o. male  Admitting Dx: Syncope  ASSESSMENT: Patient presented to the hospital on 2/4 with a syncopal episode. Inpatient work-up revealed critical aortic stenosis. S/P multiple dental extractions with alveoloplasty on 2/10 in preparation for aortic valve replacement.  Pt has been eating 100% of his meals. His lasix has been increased due to increasing weight.  Plan for home Sat with son.  CBG's: 96, 106, 134 Current diet is not likely meeting pt's estimated needs, will add supplement.   Height: Ht Readings from Last 1 Encounters:  04/26/14 5\' 11"  (1.803 m)    Weight: Wt Readings from Last 1 Encounters:  05/01/14 269 lb 10 oz (122.3 kg)    BMI:  Body mass index is 37.62 kg/(m^2). class 2 obesity  Estimated Nutritional Needs: Kcal: 2000-2200 Protein: 115-130 gm Fluid: 2-2.2 L  Skin: no issues  Diet Order: Diet heart healthy/carb modified   Intake/Output Summary (Last 24 hours) at 05/01/14 1204 Last data filed at 05/01/14 0925  Gross per 24 hour  Intake   1080 ml  Output    650 ml  Net    430 ml    Last BM: 2/17   Labs:   Recent Labs Lab 04/26/14 2120  04/27/14 0400 04/27/14 1828 04/27/14 1831 04/28/14 0533 04/29/14 0545  NA  --   < > 140  --  136 135 136  K  --   < > 4.3  --  4.1 3.9 4.2  CL  --   < > 106  --  102 104 102  CO2  --   --  27  --   --  28 30  BUN  --   < > 14  --  20 25* 25*  CREATININE 0.82  < > 0.84 1.21 1.10 1.42* 1.13  CALCIUM  --   --  7.8*  --   --  8.0* 8.1*  MG 2.7*  --  2.5  2.4  --   --   --   GLUCOSE  --   < > 138*  --  134* 156* 99  < > = values in this interval not displayed.  CBG (last 3)   Recent Labs  04/30/14 2200 05/01/14 0617 05/01/14 1128  GLUCAP 106* 134* 105*    Scheduled Meds: . aspirin EC  325 mg Oral Daily  . docusate sodium  200 mg Oral Daily  . donepezil  10 mg Oral QHS  . enoxaparin (LOVENOX) injection  40 mg Subcutaneous QHS  . furosemide  40 mg Oral Daily  . gabapentin  400 mg Oral TID  . insulin aspart  0-24 Units Subcutaneous TID AC & HS  . metoprolol tartrate  12.5 mg Oral BID  . moving right along book   Does not apply Once  . OxyCODONE  30 mg Oral Q12H  . pantoprazole  40 mg Oral QAC breakfast  . potassium chloride  20 mEq Oral Daily  . simvastatin  20 mg Oral Daily  . sodium chloride  3 mL Intravenous Q12H  . tamsulosin  0.4 mg Oral  Daily  . venlafaxine XR  150 mg Oral Q breakfast    Continuous Infusions:   Kendell Bane RD, LDN, CNSC [phone] Pager [phone] After Hours Pager

## 2014-05-01 NOTE — Progress Notes (Signed)
CARDIAC REHAB PHASE I   PRE:  Rate/Rhythm: 88 afib    BP: sitting to BR    SaO2: 94 RA  MODE:  Ambulation: 300 ft   POST:  Rate/Rhythm: 105 afib    BP: sitting 157/88     SaO2: 92-93 RA  Pt got out of chair without assistance, unsupervised, trying to get to BR to urinate. Pt complaint of his chest hurting and sts "I have to use my arms to get up".  Gave pt instructions on how to rock to stand without using arms. Not very receptive. Pt able to walk 2 laps around circle with RW, supervision assist with gait belt. Rest x2. Return to recliner. Only c/o was chest soreness. PT to see this pm. [phone]  Elissa Lovetteeve, Matricia Begnaud [place] CES, ACSM 05/01/2014 10:56 AM

## 2014-05-02 DIAGNOSIS — Z954 Presence of other heart-valve replacement: Secondary | ICD-10-CM

## 2014-05-02 LAB — GLUCOSE, CAPILLARY
GLUCOSE-CAPILLARY: 127 mg/dL — AB (ref 70–99)
Glucose-Capillary: 135 mg/dL — ABNORMAL HIGH (ref 70–99)
Glucose-Capillary: 120 mg/dL — ABNORMAL HIGH (ref 70–99)

## 2014-05-02 MED ORDER — POTASSIUM CHLORIDE CRYS ER 10 MEQ PO TBCR: 10.0000 meq | EXTENDED_RELEASE_TABLET | Freq: Every day | ORAL | Status: DC

## 2014-05-02 MED ORDER — ASPIRIN EC 81 MG PO TBEC: 325.0000 mg | DELAYED_RELEASE_TABLET | Freq: Every day | ORAL | Status: DC

## 2014-05-02 MED ORDER — METOPROLOL TARTRATE 25 MG PO TABS: 12.5000 mg | ORAL_TABLET | Freq: Two times a day (BID) | ORAL | Status: DC

## 2014-05-02 MED ORDER — ASPIRIN EC 81 MG PO TBEC: 81.0000 mg | DELAYED_RELEASE_TABLET | Freq: Every day | ORAL | Status: DC

## 2014-05-02 MED ORDER — APIXABAN 2.5 MG PO TABS: 2.5000 mg | ORAL_TABLET | Freq: Two times a day (BID) | ORAL | Status: DC

## 2014-05-02 MED ORDER — APIXABAN 2.5 MG PO TABS
2.5000 mg | ORAL_TABLET | Freq: Two times a day (BID) | ORAL | Status: DC
  Filled 2014-05-02 (×2): qty 1

## 2014-05-02 NOTE — Progress Notes (Signed)
Medicare Important Message given? YES  (If response is "NO", the following Medicare IM given date fields will be blank)  Date Medicare IM given: 05/02/14 Medicare IM given by:  Yovan Leeman  

## 2014-05-02 NOTE — Clinical Social Work Note (Signed)
CSW consulted for transportation-  Confirmed that patient is unable to find ride by friends or family.  Patient is safe to be transported by taxi- provided Char[employer] fundraiserN with taxi voucher.  CSW signing off.  Merlyn LotJenna Holoman, LCSWA Clinical Social Worker [phone]

## 2014-05-02 NOTE — Progress Notes (Signed)
Though computer generated ECG read says Atrial fibrillation, the rhythm is actually a competing junctional and sinus rhythm. Clearly in A fib with RVR now. Apparently not an anticoagulation candidate. Need to discuss whether to escalate therapy or continue conservative approach. Not clear from notes. I will try to discuss with surgery team.

## 2014-05-02 NOTE — Progress Notes (Signed)
Occupational Therapy Treatment Patient Details Name: Peter Weiss Kellman MRN: [id number] DOB: [birth year] Today's Date: 05/02/2014    History of present illness Peter Weiss Bargar is a 74 y.o. male presents with a syncopal episode. S/p AVR 04/26/14.   OT comments  Focus of session on instructing pt in use of AE and 3 in 1 as plan is for discharge home now.  Discussed ramifications of going home and benefits of short term rehab.  Pt stating he knows the best choice is rehab, but with his son coming from [place], he wants to return home.  Follow Up Recommendations  SNF;Supervision/Assistance - 24 hour (HHOT if pt is refusing SNF)    Equipment Recommendations  3 in 1 bedside comode    Recommendations for Other Services      Precautions / Restrictions Precautions Precautions: Fall;Sternal Precaution Comments: reviewed sternal precautions related to ADL and with sit to stand Restrictions Weight Bearing Restrictions: No Other Position/Activity Restrictions: Sternal       Mobility Bed Mobility               General bed mobility comments: pt in chair  Transfers Overall transfer level: Needs assistance Equipment used: Rolling walker (2 wheeled) Transfers: Sit to/from Stand Sit to Stand: Mod assist;Min assist (min from 3 in 1, mod from recliner)         General transfer comment: cues to use momentum and avoid use of UEs    Balance                                   ADL Overall ADL's : Needs assistance/impaired     Grooming: Oral care;Min guard;Wash/dry hands;Standing       Lower Body Bathing: Moderate assistance;Sit to/from stand Lower Body Bathing Details (indicate cue type and reason): issued long handled shoe horn and instructed in use     Lower Body Dressing: Moderate assistance;Sit to/from stand Lower Body Dressing Details (indicate cue type and reason): instructed in use and issued reacher and long shoe horn, recommended pt wear slip on  shoes with non slip bottoms as sock aide requires excessive pulling with UEs Toilet Transfer: Min guard;BSC;RW (BSC over toilet)   Toileting- Clothing Manipulation and Hygiene: Maximal assistance;Sit to/from stand Toileting - Clothing Manipulation Details (indicate cue type and reason): issued and instructed in use of toilet tongs, recommended wet wipes     Functional mobility during ADLs: Min guard;Rolling walker General ADL Comments: Pain preventing pt from being able to cross foot up to opposite knee to reach feet for ADL.  Attempted to pull foot up, resulting in breaking sternal precautions. Talked with pt about requirements of returning home with such limited support including getting groceries and medications, preparing meals, laundry, managing medications.  Pt voiced understanding of all involved for him to take care of himself.       Vision                     Perception     Praxis      Cognition   Behavior During Therapy: WFL for tasks assessed/performed Overall Cognitive Status: Impaired/Different from baseline Area of Impairment: Safety/judgement;Problem solving;Memory     Memory: Decreased recall of precautions;Decreased short-term memory    Safety/Judgement: Decreased awareness of safety;Decreased awareness of deficits     General Comments: Pt wanting to return home without considering his level of dependence and weakness.  Extremity/Trunk Assessment               Exercises     Shoulder Instructions       General Comments      Pertinent Vitals/ Pain       Pain Assessment: Faces Faces Pain Scale: Hurts little more Pain Location: chest incision Pain Descriptors / Indicators: Aching Pain Intervention(s): Limited activity within patient's tolerance;Premedicated before session  Home Living                                          Prior Functioning/Environment              Frequency Min 2X/week     Progress Toward  Goals  OT Goals(current goals can now be found in the care plan section)  Progress towards OT goals: Progressing toward goals  Acute Rehab OT Goals Time For Goal Achievement: 05/14/14  Plan Discharge plan remains appropriate    Co-evaluation                 End of Session Equipment Utilized During Treatment: Gait belt;Rolling walker   Activity Tolerance Patient limited by fatigue   Patient Left in chair;with call bell/phone within reach   Nurse Communication  (concerns about pt's  return home)        Time: 1610-9604 OT Time Calculation (min): 46 min  Charges: OT General Charges $OT Visit: 1 Procedure OT Treatments $Self Care/Home Management : 38-52 mins  Evern Bio 05/02/2014, 11:19 AM  [phone]

## 2014-05-02 NOTE — Progress Notes (Signed)
[id number] Brief education with pt re enforcing sternal precautions, modified ex ed, IS, and CRP 2. Pt gave permission to refer to [facility] Phase 2. Left diabetic diet for reference as HGA1C 7.0 and made few heart healthy suggestions.  Pt receptive to ed. Luetta Nuttingharlene Jaselle Pryer RN BSN 05/02/2014 12:00 PM

## 2014-05-02 NOTE — Progress Notes (Signed)
      [address]       Peter Weiss,[place] [id number]             [phone]      6 Days Post-Op Procedure(s) (LRB): AORTIC VALVE REPLACEMENT (AVR) (N/A) MAZE (N/A) TRANSESOPHAGEAL ECHOCARDIOGRAM (TEE) (N/A)   Subjective:   [name] has no complaints this morning.  He is hoping to be discharged home today however he does not have a ride.  Objective: Vital signs in last 24 hours: Temp:  [97.6 F (36.4 C)-98.2 F (36.8 C)] 98.2 F (36.8 C) (02/18 0626) Pulse Rate:  [69-86] 74 (02/18 0626) Cardiac Rhythm:  [-] Atrial fibrillation (02/17 0814) Resp:  [18] 18 (02/18 0626) BP: (127-155)/(61-84) 135/72 mmHg (02/18 0626) SpO2:  [90 %-97 %] 90 % (02/18 0626) Weight:  [267 lb 13.7 oz (121.5 kg)] 267 lb 13.7 oz (121.5 kg) (02/18 0702)  Intake/Output from previous day: 02/17 0701 - 02/18 0700 In: 1200 [P.O.:1200] Out: 555 [Urine:555]  General appearance: alert, cooperative and no distress Heart: irregularly irregular rhythm Lungs: diminished breath sounds bibasilar Abdomen: soft, non-tender; bowel sounds normal; no masses,  no organomegaly Extremities: edema trace Wound: clean and dry  Lab Results: No results for input(s): WBC, HGB, HCT, PLT in the last 72 hours. BMET: No results for input(s): NA, K, CL, CO2, GLUCOSE, BUN, CREATININE, CALCIUM in the last 72 hours.  PT/INR: No results for input(s): LABPROT, INR in the last 72 hours. ABG    Component Value Date/Time   PHART 7.390 04/27/2014 0947   HCO3 23.0 04/27/2014 0947   TCO2 22 04/27/2014 1831   ACIDBASEDEF 2.0 04/27/2014 0947   O2SAT 94.0 04/27/2014 0947   CBG (last 3)   Recent Labs  05/01/14 1128 05/01/14 1631 05/02/14 0621  GLUCAP 105* 120* 120*    Assessment/Plan: S/P Procedure(s) (LRB): AORTIC VALVE REPLACEMENT (AVR) (N/A) MAZE (N/A) TRANSESOPHAGEAL ECHOCARDIOGRAM (TEE) (N/A)  1. CV- continues to have episodes of rate controlled Atrial Fibrillation- he is tolerating low dose Lopressor, he  is not a candidate for coumadin, may benefit from Eliquis 2. Pulm- not on oxygen, continue IS 3. Renal- remains hypervolemic, continue diuresis 4. Deconditioning- patient refusing SNF, orders placed for Home Health/PT 5. Dispo- patient is stable, intermittent episodes of rate controlled atrial fibrillation not a candidate for Coumadin, patient would like to be discharged today however he does not have a ride available, will discuss d/c plans with staff   LOS: 14 days    Lowella DandyBARRETT, Agustina Witzke 05/02/2014

## 2014-05-03 NOTE — Discharge Summary (Signed)
Patient ID: Peter Weiss MRN: [id number] DOB/AGE: 1940-04-24 74 y.o.  Admit date: 04/18/2014 Discharge date: 05/01/2014  Admission Diagnoses:  Patient Active Problem List   Diagnosis Date Noted  . Acute kidney injury   . Aortic stenosis   . Atrial fibrillation, unspecified   . Dental caries   . Pyrexia   . Aortic valve stenosis, critical 04/20/2014  . Syncope and collapse 04/18/2014  . Dehydration 04/18/2014  . Hypotension 04/18/2014  . Syncope 04/18/2014  . Arterial hypotension 03/25/2014  . Lethargic 03/25/2014  . Adjustment disorder with mixed anxiety and depressed mood 03/25/2014  . Chronic back pain 09/28/2013  . Mild cognitive impairment 09/28/2013  . DM2 (diabetes mellitus, type 2) 09/28/2013  . HLD (hyperlipidemia) 09/28/2013  . HTN (hypertension) 09/28/2013  . GERD (gastroesophageal reflux disease) 09/28/2013   Discharge Diagnoses:   Patient Active Problem List   Diagnosis Date Noted  . S/P AVR 04/26/2014  . Acute kidney injury   . Aortic stenosis   . Atrial fibrillation, unspecified   . Dental caries   . Pyrexia   . Aortic valve stenosis, critical 04/20/2014  . Syncope and collapse 04/18/2014  . Dehydration 04/18/2014  . Hypotension 04/18/2014  . Syncope 04/18/2014  . Arterial hypotension 03/25/2014  . Lethargic 03/25/2014  . Adjustment disorder with mixed anxiety and depressed mood 03/25/2014  . Chronic back pain 09/28/2013  . Mild cognitive impairment 09/28/2013  . DM2 (diabetes mellitus, type 2) 09/28/2013  . HLD (hyperlipidemia) 09/28/2013  . HTN (hypertension) 09/28/2013  . GERD (gastroesophageal reflux disease) 09/28/2013   Discharged Condition: good  History of Present Illness:   Peter Weiss is a 74 yo obese white male with multiple medicals problems including known AS. The patient presented to the ED after he  suffered a fall and "blacked out." Workup revealed the patient to have an altered mental status, mild fever at 100.4, elevated Troponin, Hypotensive, and EKG revealed Atrial Fibrillation. V/Q scan and D-Dimer were also obtained and were WNL. He was admitted by the medicine service for further workup.   Hospital Course:   The patient showed no evidence of acute infection despite fever and was no placed on IV ABX. He underwent Echocardiogram which confirmed severe Aortic Stenosis. He had undergone cardiac catheterization in January of this year which did not reveal evidence of significant coronary disease. Due to his Aortic Stenosis TCTS was consulted. He was evaluated by Dr. Dorris Fetch on 04/20/2014. He was in agreement the patient would need aortic valve replacement. However the patient had poor dentition and would require intervention from OMFS prior to proceeding to the operating room. He was taken to the operating room and underwent extractions of multiple teeth, alveoloplasty with 3 quadrants, debridement of remaining dentition on 04/24/2014. The patient remained stable post procedure. He did however convert back into Atrial Fibrillation.  He was again taken to the operating room on 04/26/2014. He underwent AVR with a 27mm [employer] Perimount Pericardial Tissue Valve, Left and Right MAZE procedure, and Clipping of LA Appendage. The patient tolerated the procedure well and was taken to the SICU in stable condition. During his stay in the SICU the patient was uncooperative with weaning from the ventilator. He wanted to wait until the MD was present. He was able to be extubated on POD #1. He was weaned off Dopamine as tolerated. He required Atrial pacing due to junctional bradycardia. This ultimately improved with rates maintained in the 80s. His chest tubes and arterial lines were removed without  difficulty. He was felt medically stable for transfer to the step down unit on  POD #2.  The patient continued to make progress. He was maintaining NSR and his pacing wires were removed. He developed some intermittent Atrial Fibrillation and he was started on low dose Beta Blocker which was tolerated without difficulty. He was also started on Eliquis. He was not able to ambulate much due to chronic pain in his left knee. He uses high dose narcotics at home and his regimen was adjusted to his home dose. The patient had some hematuria which was most likely due to trauma from his foley catheter. He has been having issues with incontinence for the past year and would probably benefit from evaluation with Urology on an outpatient basis. The patient is deconditioned and based on PT evaluation he would benefit from SNF placement. However, patient stated his son was coming in from [place] for 10 days and would be able to assist with care. Therefore, we have arranged home health and home PT. He is medically stable at this time. Should no issues arise we anticipate discharge home on 05/02/2014.   Significant Diagnostic Studies: cardiac graphics: Echocardiogram:   - Left ventricle: The cavity size was normal. Wall thickness was increased in a pattern of moderate LVH. Systolic function was normal. The estimated ejection fraction was in the range of 55% to 60%. Wall motion was normal; there were no regional wall motion abnormalities. Doppler parameters are consistent with high ventricular filling pressure. - Aortic valve: Valve mobility was restricted. There was severe stenosis. There was trivial regurgitation. Valve area (VTI): 0.56 cm^2. Valve area (Vmax): 0.61 cm^2. Valve area (Vmean): 0.56 cm^2. - Aortic root: The aortic root was moderately dilated. - Mitral valve: Calcified annulus. - Left atrium: The atrium was severely dilated. - Right atrium: The atrium was mildly dilated.  Treatments: surgery:   1. Aortic valve replacement with 27 mm Edwards  Perimount  Bovine pericardial valve (model number 2700, serial number  [id number]). 2. Modified-Cox IV maze procedure using radiofrequency ablation. 3. Clipping of left atrial appendage  Disposition: Home with Home Health  Discharge Medications:     Medication List    STOP taking these medications       losartan 50 MG tablet  Commonly known as: COZAAR     metoprolol succinate 50 MG 24 hr tablet  Commonly known as: TOPROL-XL      TAKE these medications       apixaban 2.5 MG Tabs tablet  Commonly known as: ELIQUIS  Take 1 tablet (2.5 mg total) by mouth 2 (two) times daily.     aspirin EC 81 MG tablet  Take 4 tablets (325 mg total) by mouth daily.     donepezil 10 MG tablet  Commonly known as: ARICEPT  Take 10 mg by mouth at bedtime.     furosemide 20 MG tablet  Commonly known as: LASIX  Take 20 mg by mouth daily.     gabapentin 400 MG capsule  Commonly known as: NEURONTIN  Take 400 mg by mouth 3 (three) times daily.     metoprolol tartrate 25 MG tablet  Commonly known as: LOPRESSOR  Take 0.5 tablets (12.5 mg total) by mouth 2 (two) times daily.     multivitamin capsule  Take 1 capsule by mouth daily.     omeprazole 20 MG capsule  Commonly known as: PRILOSEC  Take 20 mg by mouth daily.     oxycodone 30 MG immediate release tablet  Commonly known as: ROXICODONE  Take 30 mg by mouth every 6 (six) hours as needed for pain.     Oxycodone HCl 10 MG Tabs  Take 1 tablet (10 mg total) by mouth 3 (three) times daily.     potassium chloride 10 MEQ tablet  Commonly known as: K-DUR,KLOR-CON  Take 1 tablet (10 mEq total) by mouth daily.     simvastatin 20 MG tablet  Commonly known as: ZOCOR  Take 20 mg by mouth daily.     tamsulosin 0.4 MG Caps capsule  Commonly known as: FLOMAX  Take 0.4 mg by mouth daily.     venlafaxine XR 150 MG 24 hr capsule  Commonly  known as: EFFEXOR-XR  Take 150 mg by mouth daily with breakfast.         The patient has been discharged on:   1.Beta Blocker: Yes [ x ]  No   If No, reason:  2.Ace Inhibitor/ARB: Yes   No [ x ]  If No, reason: labile blood pressure, renal insufficiency  3.Statin: Yes [ x ]  No   If No, reason:  4.Ecasa: Yes [ x ]  No   If No, reason:  Follow-up Information    Follow up with Loreli Slot, MD On 05/28/2014.   Specialty: Cardiothoracic Surgery   Why: Appointment is at 10:30   Contact information:   [address] [employer] [address] [place] [address] [phone]       Follow up with  IMAGING On 05/28/2014.   Why: Please get CXR at 10:00   Contact information:   [facility]       Follow up with Julien Nordmann, MD On 05/17/2014.   Specialty: Cardiology   Why: Appointment is at [address] information:   [address] [place] [address] [phone]            Signed: Lowella Dandy 05/01/2014, 11:53 AM

## 2014-05-04 DIAGNOSIS — Z4801 Encounter for change or removal of surgical wound dressing: Secondary | ICD-10-CM | POA: Diagnosis not present

## 2014-05-04 DIAGNOSIS — E119 Type 2 diabetes mellitus without complications: Secondary | ICD-10-CM | POA: Diagnosis not present

## 2014-05-04 DIAGNOSIS — K219 Gastro-esophageal reflux disease without esophagitis: Secondary | ICD-10-CM | POA: Diagnosis not present

## 2014-05-04 DIAGNOSIS — Z48812 Encounter for surgical aftercare following surgery on the circulatory system: Secondary | ICD-10-CM | POA: Diagnosis not present

## 2014-05-04 DIAGNOSIS — I10 Essential (primary) hypertension: Secondary | ICD-10-CM | POA: Diagnosis not present

## 2014-05-04 DIAGNOSIS — I4891 Unspecified atrial fibrillation: Secondary | ICD-10-CM | POA: Diagnosis not present

## 2014-05-06 DIAGNOSIS — I4891 Unspecified atrial fibrillation: Secondary | ICD-10-CM | POA: Diagnosis not present

## 2014-05-06 DIAGNOSIS — E119 Type 2 diabetes mellitus without complications: Secondary | ICD-10-CM | POA: Diagnosis not present

## 2014-05-06 DIAGNOSIS — Z48812 Encounter for surgical aftercare following surgery on the circulatory system: Secondary | ICD-10-CM | POA: Diagnosis not present

## 2014-05-06 DIAGNOSIS — Z4801 Encounter for change or removal of surgical wound dressing: Secondary | ICD-10-CM | POA: Diagnosis not present

## 2014-05-06 DIAGNOSIS — I10 Essential (primary) hypertension: Secondary | ICD-10-CM | POA: Diagnosis not present

## 2014-05-06 DIAGNOSIS — K219 Gastro-esophageal reflux disease without esophagitis: Secondary | ICD-10-CM | POA: Diagnosis not present

## 2014-05-09 ENCOUNTER — Telehealth: Payer: Self-pay | Admitting: *Deleted

## 2014-05-09 DIAGNOSIS — Z48812 Encounter for surgical aftercare following surgery on the circulatory system: Secondary | ICD-10-CM | POA: Diagnosis not present

## 2014-05-09 DIAGNOSIS — I10 Essential (primary) hypertension: Secondary | ICD-10-CM | POA: Diagnosis not present

## 2014-05-09 DIAGNOSIS — I4891 Unspecified atrial fibrillation: Secondary | ICD-10-CM | POA: Diagnosis not present

## 2014-05-09 DIAGNOSIS — K219 Gastro-esophageal reflux disease without esophagitis: Secondary | ICD-10-CM | POA: Diagnosis not present

## 2014-05-09 DIAGNOSIS — E119 Type 2 diabetes mellitus without complications: Secondary | ICD-10-CM | POA: Diagnosis not present

## 2014-05-09 DIAGNOSIS — Z4801 Encounter for change or removal of surgical wound dressing: Secondary | ICD-10-CM | POA: Diagnosis not present

## 2014-05-10 DIAGNOSIS — I350 Nonrheumatic aortic (valve) stenosis: Secondary | ICD-10-CM | POA: Diagnosis not present

## 2014-05-10 DIAGNOSIS — Z4801 Encounter for change or removal of surgical wound dressing: Secondary | ICD-10-CM | POA: Diagnosis not present

## 2014-05-10 DIAGNOSIS — Z48812 Encounter for surgical aftercare following surgery on the circulatory system: Secondary | ICD-10-CM | POA: Diagnosis not present

## 2014-05-10 DIAGNOSIS — I10 Essential (primary) hypertension: Secondary | ICD-10-CM | POA: Diagnosis not present

## 2014-05-10 DIAGNOSIS — I4891 Unspecified atrial fibrillation: Secondary | ICD-10-CM | POA: Diagnosis not present

## 2014-05-10 DIAGNOSIS — K219 Gastro-esophageal reflux disease without esophagitis: Secondary | ICD-10-CM | POA: Diagnosis not present

## 2014-05-10 DIAGNOSIS — G8929 Other chronic pain: Secondary | ICD-10-CM | POA: Diagnosis not present

## 2014-05-10 DIAGNOSIS — E119 Type 2 diabetes mellitus without complications: Secondary | ICD-10-CM | POA: Diagnosis not present

## 2014-05-13 ENCOUNTER — Telehealth: Payer: Self-pay | Admitting: *Deleted

## 2014-05-13 DIAGNOSIS — I10 Essential (primary) hypertension: Secondary | ICD-10-CM | POA: Diagnosis not present

## 2014-05-13 DIAGNOSIS — Z48812 Encounter for surgical aftercare following surgery on the circulatory system: Secondary | ICD-10-CM | POA: Diagnosis not present

## 2014-05-13 DIAGNOSIS — E119 Type 2 diabetes mellitus without complications: Secondary | ICD-10-CM | POA: Diagnosis not present

## 2014-05-13 DIAGNOSIS — Z4801 Encounter for change or removal of surgical wound dressing: Secondary | ICD-10-CM | POA: Diagnosis not present

## 2014-05-13 DIAGNOSIS — I4891 Unspecified atrial fibrillation: Secondary | ICD-10-CM | POA: Diagnosis not present

## 2014-05-13 DIAGNOSIS — K219 Gastro-esophageal reflux disease without esophagitis: Secondary | ICD-10-CM | POA: Diagnosis not present

## 2014-05-13 NOTE — Telephone Encounter (Signed)
Nurse of patient is calling stating that Friday pt was discharged from hospital and that they gave him an RX for Eliquis Pt family went and it was too expensive so they could not get it. Nurse is calling asking if we can get something else for him  And if we could give samples or something for patient is coming Friday but not sure it they will make it She states that they are also having problems with transportation but it working with them. She will be seeing patient today around 12-1 pm would like to know something by then .  Please call.

## 2014-05-13 NOTE — Telephone Encounter (Signed)
[facility]/ [facility] She reports that pt's son lives in [place], visited for 10 days, but has returned home. She reports that pt has no heat, running water or a telephone, but a Child psychotherapistsocial worker is sched to visit to assess pt's living situation and pt's PCP, Dr. Ellsworth Lennoxejan-Sie is aware. Pt has no transportation, so French Anaracy will try to come over today to p/u samples for pt, but she does not know if he will be able to come in for his hosp f/u visit. She will call back if we can be of further assistance.

## 2014-05-13 NOTE — Telephone Encounter (Signed)
We can proved pt w/ samples. Left message for French Anaracy to call back.

## 2014-05-17 ENCOUNTER — Telehealth: Payer: Self-pay

## 2014-05-17 ENCOUNTER — Encounter: Payer: Self-pay | Admitting: *Deleted

## 2014-05-17 ENCOUNTER — Encounter: Payer: Self-pay | Admitting: Cardiovascular Disease

## 2014-05-17 ENCOUNTER — Telehealth: Payer: Self-pay | Admitting: Cardiovascular Disease

## 2014-05-17 DIAGNOSIS — R609 Edema, unspecified: Secondary | ICD-10-CM

## 2014-05-17 DIAGNOSIS — E785 Hyperlipidemia, unspecified: Secondary | ICD-10-CM

## 2014-05-17 DIAGNOSIS — I10 Essential (primary) hypertension: Secondary | ICD-10-CM

## 2014-05-17 MED ORDER — POTASSIUM CHLORIDE CRYS ER 10 MEQ PO TBCR: 10.0000 meq | EXTENDED_RELEASE_TABLET | Freq: Every day | ORAL | Status: DC

## 2014-05-17 MED ORDER — SIMVASTATIN 20 MG PO TABS: 20.0000 mg | ORAL_TABLET | Freq: Every day | ORAL | Status: DC

## 2014-05-17 MED ORDER — FUROSEMIDE 20 MG PO TABS: 20.0000 mg | ORAL_TABLET | Freq: Every day | ORAL | Status: DC

## 2014-05-17 MED ORDER — METOPROLOL TARTRATE 25 MG PO TABS: 12.5000 mg | ORAL_TABLET | Freq: Two times a day (BID) | ORAL | Status: DC

## 2014-05-17 NOTE — Telephone Encounter (Signed)
Left message for pt to call back  °

## 2014-05-17 NOTE — Telephone Encounter (Signed)
RX for lasix 20 mg. Metoprolol 12.5 mg, potassium 10 meq, and Zocor 20 mg were refilled and patient was instructed to schedule appointment with Dr. Mariah MillingGollan. For medication management

## 2014-05-17 NOTE — Telephone Encounter (Signed)
Patient was not aware that he had an appt. Today.  Patient did not want to reschedule at this time but rather establish with a pcp first.   Per patient he has never seen Naomie Deanarrie Doss but she is listed as his pcp.  Patient was given the office number to call about an appointment.

## 2014-05-17 NOTE — Telephone Encounter (Signed)
Pt called, no showed for his appt this morning, asks if he is supposed to be on any medications from his recent  hosp visit. States "the hospital took all my medications when I went in, and I left with none". His question is should he be on any medication. Please advise.

## 2014-05-23 ENCOUNTER — Other Ambulatory Visit: Payer: Self-pay | Admitting: Thoracic Surgery (Cardiothoracic Vascular Surgery)

## 2014-05-23 DIAGNOSIS — Z952 Presence of prosthetic heart valve: Secondary | ICD-10-CM

## 2014-05-28 ENCOUNTER — Encounter: Payer: Self-pay | Admitting: Thoracic Surgery (Cardiothoracic Vascular Surgery)

## 2014-05-28 ENCOUNTER — Ambulatory Visit
Admission: RE | Admit: 2014-05-28 | Discharge: 2014-05-28 | Disposition: A | Discharge status: Home / Self Care | Payer: Medicare Other | Source: Ambulatory Visit | Attending: Thoracic Surgery (Cardiothoracic Vascular Surgery) | Admitting: Thoracic Surgery (Cardiothoracic Vascular Surgery)

## 2014-05-28 ENCOUNTER — Ambulatory Visit (INDEPENDENT_AMBULATORY_CARE_PROVIDER_SITE_OTHER): Payer: Self-pay | Admitting: Thoracic Surgery (Cardiothoracic Vascular Surgery)

## 2014-05-28 VITALS — BP 116/69 | HR 63 | Resp 16 | Ht 71.0 in | Wt 268.0 lb

## 2014-05-28 DIAGNOSIS — Z8679 Personal history of other diseases of the circulatory system: Secondary | ICD-10-CM

## 2014-05-28 DIAGNOSIS — Z952 Presence of prosthetic heart valve: Secondary | ICD-10-CM

## 2014-05-28 DIAGNOSIS — I350 Nonrheumatic aortic (valve) stenosis: Secondary | ICD-10-CM

## 2014-05-28 DIAGNOSIS — Z954 Presence of other heart-valve replacement: Secondary | ICD-10-CM

## 2014-05-28 DIAGNOSIS — Z9889 Other specified postprocedural states: Secondary | ICD-10-CM

## 2014-05-28 NOTE — Patient Instructions (Signed)
Follow up with Dr. Mariah MillingGollan your Cardiologist  Do not lift anything over 10 pounds for another 2 weeks  Continue present medications

## 2014-05-28 NOTE — Progress Notes (Signed)
[address]       [place] [id number]             [phone]       HPI:  [name] returns today for a scheduled postoperative follow-up visit. He had an aortic valve replacement, Maze procedure and left atrial appendage clipping on 04/26/2014. Postoperatively he was initially varying between sinus rhythm and junctional rhythm. He did have some atrial fibrillation postoperatively and so was discharged on a baby aspirin and Eliquis. He was not felt to be a candidate for Coumadin.  He says that he is having pain. He has chronic pain issues and takes OxyContin and oxycodone regular basis. He currently is taking OxyContin 30 mg and then using oxycodone 10 mg 3 times daily as needed. He pretty much is using that around-the-clock. He wants to know if he can be on a higher baseline doses of OxyContin. He cannot recall who gave him his prescription for that medication.  He is not having any shortness of breath. His swelling in his legs has improved dramatically.  Past Medical History  Diagnosis Date  . Arthritis   . Chicken pox   . Heart murmur   . Hypertension   . Hyperlipidemia   . Urinary incontinence   . History of fainting spells of unknown cause   . Diabetes mellitus without complication   . Depression       Current Outpatient Prescriptions  Medication Sig Dispense Refill  . apixaban (ELIQUIS) 2.5 MG TABS tablet Take 1 tablet (2.5 mg total) by mouth 2 (two) times daily. 60 tablet 3  . aspirin EC 81 MG tablet Take 4 tablets (325 mg total) by mouth daily.    Marland Kitchen. donepezil (ARICEPT) 10 MG tablet Take 10 mg by mouth at bedtime.    . furosemide (LASIX) 20 MG tablet Take 1 tablet (20 mg total) by mouth daily. 30 tablet 1  . gabapentin (NEURONTIN) 400 MG capsule Take 400 mg by mouth 3 (three) times daily.    . metoprolol tartrate (LOPRESSOR) 25 MG tablet Take 0.5 tablets (12.5 mg total) by mouth 2 (two) times daily. 30 tablet 3  . Multiple Vitamin (MULTIVITAMIN)  capsule Take 1 capsule by mouth daily.    Marland Kitchen. omeprazole (PRILOSEC) 20 MG capsule Take 20 mg by mouth daily.    Marland Kitchen. oxycodone (ROXICODONE) 30 MG immediate release tablet Take 30 mg by mouth every 6 (six) hours as needed for pain.    . Oxycodone HCl 10 MG TABS Take 1 tablet (10 mg total) by mouth 3 (three) times daily. 90 each 0  . potassium chloride (K-DUR,KLOR-CON) 10 MEQ tablet Take 1 tablet (10 mEq total) by mouth daily. 30 tablet 3  . simvastatin (ZOCOR) 20 MG tablet Take 1 tablet (20 mg total) by mouth daily. 30 tablet 1  . tamsulosin (FLOMAX) 0.4 MG CAPS capsule Take 0.4 mg by mouth daily.    Marland Kitchen. venlafaxine XR (EFFEXOR-XR) 150 MG 24 hr capsule Take 150 mg by mouth daily with breakfast.     No current facility-administered medications for this visit.    Physical Exam BP 116/69 mmHg  Pulse 63  Resp 16  Ht 5\' 11"  (1.803 m)  Wt 268 lb (121.564 kg)  BMI 37.39 kg/m2  SpO412 5294% 74 year old man in no acute distress Alert and oriented 3. No focal neurologic deficits. He does have some short-term memory issues, which appear to be stable from his preoperative state. Sternum stable, incision  healing well Cardiac regular rate and rhythm normal S1 and S2, no murmur Lungs clear with equal breath sounds bilaterally Trace edema  Diagnostic Tests: His chest x-ray has not yet been read. It shows postoperative changes. There is no effusions or infiltrates. He does appear to be some eventration of the right hemidiaphragm, which was present preoperatively.  Impression: 74 year old gentleman who is now month out from aortic valve replacement, Maze procedure, and left atrial appendage clipping. All things considered I think he is doing remarkably well present time. He does have pain issues but suffers from chronic pain and chronic narcotic usage. He asked about increasing his dose of OxyContin. I am not comfortable doing that given his unreliability from a history standpoint. I emphasized that he needed one  primary doctor who manages chronic pain and not to be getting multiple narcotic prescriptions for multiple sources.  His rhythm strip today showed that he was in a junctional rhythm. There was no atrial fibrillation. He should continue his current medications.  He needs follow-up with Dr. Mariah Milling. We have scheduled him an appointment for later this month.  I advised him not to lift over 10 pounds for another 2 weeks.  Plan:  Follow-up with Dr. Mariah Milling  I will be happy to see him back any time if I can be of any further assistance with his care Loreli Slot, MD [facility] [phone]

## 2014-06-13 ENCOUNTER — Ambulatory Visit: Payer: Medicare Other | Admitting: Cardiovascular Disease

## 2014-06-13 ENCOUNTER — Telehealth: Payer: Self-pay | Admitting: *Deleted

## 2014-06-13 NOTE — Telephone Encounter (Signed)
Left message to call the office to confirm his appointment with Doss on 4.4.16 @ 10:00.

## 2014-06-13 NOTE — Telephone Encounter (Addendum)
Pt called requesting refills on all of his medications.  At review of his chart 6 of his medications were refilled recently by pts Cardiologist.  Pt was advised of same.  Pt then requested an appointment with Peter Weiss, when I pulled up the Appt tab, a notice came up that pt had been dismissed from the practice since 7.15.15.  However pt was scheduled a NP appoint 1.7.16 with Peter Weiss.  Pt is requesting Gabapentin, Oxycodone, Venlafaxine, Omeprazole to be refilled.  These medications are all historical medications and not ever filled by you, Peter Weiss.  While on the phone with Peter Weiss, he was rude and mean.  He said he "is seen by several MDs on a regular basis and none of the are worth anything.  This is the worst place he has ever been."  Please advise

## 2014-06-13 NOTE — Telephone Encounter (Signed)
Patient was dismissed and should have never been scheduled apparently (found out when he was seen). We are working on what to do in this instance. Thanks!

## 2014-06-14 ENCOUNTER — Telehealth: Payer: Self-pay

## 2014-06-14 ENCOUNTER — Telehealth: Payer: Self-pay | Admitting: Nurse Practitioner

## 2014-06-14 NOTE — Telephone Encounter (Signed)
Pt dismissal letter started with HIM today.

## 2014-06-14 NOTE — Telephone Encounter (Signed)
Patient dismissed from [facility] by Naomie Deanarrie Doss NP-C , effective June 14 2014. Dismissal letter sent out by certified / registered mail.  DAJ  Received signed domestic return receipt verifying delivery of certified letter on June 19, 2014. Article number [id number] [phone] [id number]

## 2014-06-17 ENCOUNTER — Ambulatory Visit: Payer: Medicare Other | Admitting: Nurse Practitioner

## 2014-06-17 ENCOUNTER — Telehealth: Payer: Self-pay

## 2014-06-17 DIAGNOSIS — Z0289 Encounter for other administrative examinations: Secondary | ICD-10-CM

## 2014-06-17 NOTE — Telephone Encounter (Signed)
Tried calling patient to inquire about not showing for apt, no answer, lvmom.

## 2014-06-17 NOTE — Telephone Encounter (Signed)
Thanks

## 2014-06-21 NOTE — Telephone Encounter (Signed)
Thank you :)

## 2014-07-04 ENCOUNTER — Emergency Department (HOSPITAL_COMMUNITY): Payer: Medicare Other

## 2014-07-04 ENCOUNTER — Encounter (HOSPITAL_COMMUNITY)
Admission: EM | Disposition: A | Discharge status: Home / Self Care | Payer: Self-pay | Source: Home / Self Care | Attending: Internal Medicine

## 2014-07-04 ENCOUNTER — Encounter (HOSPITAL_COMMUNITY): Payer: Self-pay | Admitting: Emergency Medicine

## 2014-07-04 ENCOUNTER — Encounter: Payer: Self-pay | Admitting: *Deleted

## 2014-07-04 ENCOUNTER — Inpatient Hospital Stay (HOSPITAL_COMMUNITY)
Admission: EM | Admit: 2014-07-04 | Discharge: 2014-07-09 | DRG: 377 | Disposition: A | Discharge status: Home / Self Care | Payer: Medicare Other | Attending: Internal Medicine | Admitting: Internal Medicine

## 2014-07-04 ENCOUNTER — Ambulatory Visit: Payer: Medicare Other | Admitting: Cardiovascular Disease

## 2014-07-04 DIAGNOSIS — Z953 Presence of xenogenic heart valve: Secondary | ICD-10-CM

## 2014-07-04 DIAGNOSIS — M549 Dorsalgia, unspecified: Secondary | ICD-10-CM | POA: Diagnosis present

## 2014-07-04 DIAGNOSIS — G3184 Mild cognitive impairment, so stated: Secondary | ICD-10-CM | POA: Diagnosis present

## 2014-07-04 DIAGNOSIS — Z952 Presence of prosthetic heart valve: Secondary | ICD-10-CM

## 2014-07-04 DIAGNOSIS — F329 Major depressive disorder, single episode, unspecified: Secondary | ICD-10-CM | POA: Diagnosis present

## 2014-07-04 DIAGNOSIS — E119 Type 2 diabetes mellitus without complications: Secondary | ICD-10-CM | POA: Diagnosis present

## 2014-07-04 DIAGNOSIS — K254 Chronic or unspecified gastric ulcer with hemorrhage: Secondary | ICD-10-CM | POA: Diagnosis not present

## 2014-07-04 DIAGNOSIS — K2970 Gastritis, unspecified, without bleeding: Secondary | ICD-10-CM | POA: Diagnosis present

## 2014-07-04 DIAGNOSIS — Z833 Family history of diabetes mellitus: Secondary | ICD-10-CM | POA: Diagnosis not present

## 2014-07-04 DIAGNOSIS — R0789 Other chest pain: Secondary | ICD-10-CM | POA: Diagnosis present

## 2014-07-04 DIAGNOSIS — Z8249 Family history of ischemic heart disease and other diseases of the circulatory system: Secondary | ICD-10-CM

## 2014-07-04 DIAGNOSIS — Z7982 Long term (current) use of aspirin: Secondary | ICD-10-CM

## 2014-07-04 DIAGNOSIS — K269 Duodenal ulcer, unspecified as acute or chronic, without hemorrhage or perforation: Secondary | ICD-10-CM | POA: Diagnosis present

## 2014-07-04 DIAGNOSIS — I350 Nonrheumatic aortic (valve) stenosis: Secondary | ICD-10-CM | POA: Diagnosis present

## 2014-07-04 DIAGNOSIS — R578 Other shock: Secondary | ICD-10-CM | POA: Diagnosis present

## 2014-07-04 DIAGNOSIS — I4891 Unspecified atrial fibrillation: Secondary | ICD-10-CM | POA: Diagnosis present

## 2014-07-04 DIAGNOSIS — R000 Tachycardia, unspecified: Secondary | ICD-10-CM | POA: Diagnosis present

## 2014-07-04 DIAGNOSIS — Z9114 Patient's other noncompliance with medication regimen: Secondary | ICD-10-CM | POA: Diagnosis present

## 2014-07-04 DIAGNOSIS — K921 Melena: Secondary | ICD-10-CM | POA: Diagnosis not present

## 2014-07-04 DIAGNOSIS — Z7901 Long term (current) use of anticoagulants: Secondary | ICD-10-CM | POA: Diagnosis not present

## 2014-07-04 DIAGNOSIS — G8929 Other chronic pain: Secondary | ICD-10-CM | POA: Diagnosis present

## 2014-07-04 DIAGNOSIS — K922 Gastrointestinal hemorrhage, unspecified: Secondary | ICD-10-CM | POA: Diagnosis present

## 2014-07-04 DIAGNOSIS — R579 Shock, unspecified: Secondary | ICD-10-CM

## 2014-07-04 DIAGNOSIS — D62 Acute posthemorrhagic anemia: Secondary | ICD-10-CM | POA: Diagnosis present

## 2014-07-04 DIAGNOSIS — R079 Chest pain, unspecified: Secondary | ICD-10-CM | POA: Diagnosis present

## 2014-07-04 DIAGNOSIS — K449 Diaphragmatic hernia without obstruction or gangrene: Secondary | ICD-10-CM | POA: Diagnosis present

## 2014-07-04 DIAGNOSIS — I714 Abdominal aortic aneurysm, without rupture, unspecified: Secondary | ICD-10-CM | POA: Diagnosis present

## 2014-07-04 DIAGNOSIS — F4324 Adjustment disorder with disturbance of conduct: Secondary | ICD-10-CM | POA: Diagnosis present

## 2014-07-04 DIAGNOSIS — E785 Hyperlipidemia, unspecified: Secondary | ICD-10-CM | POA: Diagnosis present

## 2014-07-04 DIAGNOSIS — I712 Thoracic aortic aneurysm, without rupture: Secondary | ICD-10-CM | POA: Diagnosis present

## 2014-07-04 DIAGNOSIS — F4323 Adjustment disorder with mixed anxiety and depressed mood: Secondary | ICD-10-CM | POA: Diagnosis present

## 2014-07-04 DIAGNOSIS — R350 Frequency of micturition: Secondary | ICD-10-CM | POA: Diagnosis present

## 2014-07-04 DIAGNOSIS — I10 Essential (primary) hypertension: Secondary | ICD-10-CM | POA: Diagnosis present

## 2014-07-04 DIAGNOSIS — G894 Chronic pain syndrome: Secondary | ICD-10-CM | POA: Diagnosis present

## 2014-07-04 DIAGNOSIS — Z79899 Other long term (current) drug therapy: Secondary | ICD-10-CM | POA: Diagnosis not present

## 2014-07-04 HISTORY — DX: Nonrheumatic aortic (valve) stenosis: I35.0

## 2014-07-04 HISTORY — PX: ESOPHAGOGASTRODUODENOSCOPY: SHX5428

## 2014-07-04 LAB — BASIC METABOLIC PANEL
Anion gap: 8 (ref 5–15)
BUN: 63 mg/dL — AB (ref 6–23)
CHLORIDE: 112 mmol/L (ref 96–112)
CO2: 22 mmol/L (ref 19–32)
Calcium: 8.6 mg/dL (ref 8.4–10.5)
Creatinine, Ser: 0.85 mg/dL (ref 0.50–1.35)
GFR calc non Af Amer: 85 mL/min — ABNORMAL LOW (ref >90)
GLUCOSE: 156 mg/dL — AB (ref 70–99)
Potassium: 3.8 mmol/L (ref 3.5–5.1)
Sodium: 142 mmol/L (ref 135–145)

## 2014-07-04 LAB — I-STAT TROPONIN, ED: Troponin i, poc: 0.03 ng/mL (ref 0.00–0.08)

## 2014-07-04 LAB — GLUCOSE, CAPILLARY
Glucose-Capillary: 120 mg/dL — ABNORMAL HIGH (ref 70–99)
Glucose-Capillary: 133 mg/dL — ABNORMAL HIGH (ref 70–99)

## 2014-07-04 LAB — COMPREHENSIVE METABOLIC PANEL
ALBUMIN: 3.1 g/dL — AB (ref 3.5–5.2)
ALT: 12 U/L (ref 0–53)
AST: 14 U/L (ref 0–37)
Alkaline Phosphatase: 43 U/L (ref 39–117)
Anion gap: 12 (ref 5–15)
BUN: 66 mg/dL — ABNORMAL HIGH (ref 6–23)
CO2: 20 mmol/L (ref 19–32)
Calcium: 9.1 mg/dL (ref 8.4–10.5)
Chloride: 110 mmol/L (ref 96–112)
Creatinine, Ser: 1 mg/dL (ref 0.50–1.35)
GFR calc non Af Amer: 73 mL/min — ABNORMAL LOW (ref >90)
GFR, EST AFRICAN AMERICAN: 85 mL/min — AB (ref >90)
GLUCOSE: 207 mg/dL — AB (ref 70–99)
POTASSIUM: 3.8 mmol/L (ref 3.5–5.1)
Sodium: 142 mmol/L (ref 135–145)
TOTAL PROTEIN: 5.7 g/dL — AB (ref 6.0–8.3)
Total Bilirubin: 0.5 mg/dL (ref 0.3–1.2)

## 2014-07-04 LAB — URINALYSIS, ROUTINE W REFLEX MICROSCOPIC
Bilirubin Urine: NEGATIVE
Glucose, UA: NEGATIVE mg/dL
Hgb urine dipstick: NEGATIVE
Ketones, ur: NEGATIVE mg/dL
LEUKOCYTES UA: NEGATIVE
Nitrite: NEGATIVE
PROTEIN: NEGATIVE mg/dL
UROBILINOGEN UA: 0.2 mg/dL (ref 0.0–1.0)
pH: 5 (ref 5.0–8.0)

## 2014-07-04 LAB — CBC
HCT: 25.6 % — ABNORMAL LOW (ref 39.0–52.0)
Hemoglobin: 8.1 g/dL — ABNORMAL LOW (ref 13.0–17.0)
MCH: 27.9 pg (ref 26.0–34.0)
MCHC: 31.6 g/dL (ref 30.0–36.0)
MCV: 88.3 fL (ref 78.0–100.0)
PLATELETS: 211 10*3/uL (ref 150–400)
RBC: 2.9 MIL/uL — ABNORMAL LOW (ref 4.22–5.81)
RDW: 15.8 % — AB (ref 11.5–15.5)
WBC: 9.1 10*3/uL (ref 4.0–10.5)

## 2014-07-04 LAB — I-STAT CHEM 8, ED
BUN: 68 mg/dL — ABNORMAL HIGH (ref 6–23)
Calcium, Ion: 1.29 mmol/L (ref 1.13–1.30)
Chloride: 109 mmol/L (ref 96–112)
Creatinine, Ser: 0.9 mg/dL (ref 0.50–1.35)
Glucose, Bld: 197 mg/dL — ABNORMAL HIGH (ref 70–99)
HEMATOCRIT: 25 % — AB (ref 39.0–52.0)
HEMOGLOBIN: 8.5 g/dL — AB (ref 13.0–17.0)
Potassium: 3.8 mmol/L (ref 3.5–5.1)
Sodium: 141 mmol/L (ref 135–145)
TCO2: 18 mmol/L (ref 0–100)

## 2014-07-04 LAB — POC OCCULT BLOOD, ED: Fecal Occult Bld: POSITIVE — AB

## 2014-07-04 LAB — PREPARE RBC (CROSSMATCH)

## 2014-07-04 LAB — HEMOGLOBIN AND HEMATOCRIT, BLOOD
HCT: 23.3 % — ABNORMAL LOW (ref 39.0–52.0)
HEMATOCRIT: 22.1 % — AB (ref 39.0–52.0)
HEMATOCRIT: 21.4 % — AB (ref 39.0–52.0)
HEMOGLOBIN: 7.5 g/dL — AB (ref 13.0–17.0)
HEMOGLOBIN: 6.8 g/dL — AB (ref 13.0–17.0)
Hemoglobin: 7 g/dL — ABNORMAL LOW (ref 13.0–17.0)

## 2014-07-04 LAB — TROPONIN I: Troponin I: 0.05 ng/mL — ABNORMAL HIGH (ref <0.031)

## 2014-07-04 LAB — PROTIME-INR
INR: 1.18 (ref 0.00–1.49)
PROTHROMBIN TIME: 15.1 s (ref 11.6–15.2)

## 2014-07-04 LAB — BRAIN NATRIURETIC PEPTIDE: B NATRIURETIC PEPTIDE 5: 67 pg/mL (ref 0.0–100.0)

## 2014-07-04 LAB — MRSA PCR SCREENING: MRSA BY PCR: NEGATIVE

## 2014-07-04 LAB — I-STAT CG4 LACTIC ACID, ED
Lactic Acid, Venous: 2.54 mmol/L (ref 0.5–2.0)
Lactic Acid, Venous: 1.51 mmol/L (ref 0.5–2.0)

## 2014-07-04 SURGERY — EGD (ESOPHAGOGASTRODUODENOSCOPY)
Anesthesia: Moderate Sedation

## 2014-07-04 MED ORDER — SODIUM CHLORIDE 0.9 % IV SOLN
Freq: Once | INTRAVENOUS | Status: AC
  Administered 2014-07-04: 22:00:00 via INTRAVENOUS

## 2014-07-04 MED ORDER — SODIUM CHLORIDE 0.9 % IV BOLUS (SEPSIS): 1000.0000 mL | INTRAVENOUS | Status: DC | PRN

## 2014-07-04 MED ORDER — SODIUM CHLORIDE 0.9 % IV BOLUS (SEPSIS)
1000.0000 mL | Freq: Once | INTRAVENOUS | Status: AC
  Administered 2014-07-04: 1000 mL via INTRAVENOUS

## 2014-07-04 MED ORDER — IOHEXOL 300 MG/ML  SOLN: 25.0000 mL | INTRAMUSCULAR | Status: DC

## 2014-07-04 MED ORDER — ONDANSETRON HCL 4 MG/2ML IJ SOLN
4.0000 mg | Freq: Four times a day (QID) | INTRAMUSCULAR | Status: DC | PRN
  Administered 2014-07-05: 4 mg via INTRAVENOUS
  Filled 2014-07-04: qty 2

## 2014-07-04 MED ORDER — FENTANYL CITRATE (PF) 100 MCG/2ML IJ SOLN
INTRAMUSCULAR | Status: AC
  Filled 2014-07-04: qty 4

## 2014-07-04 MED ORDER — OXYCODONE HCL 5 MG PO TABS: 5.0000 mg | ORAL_TABLET | ORAL | Status: DC | PRN

## 2014-07-04 MED ORDER — IOHEXOL 350 MG/ML SOLN
100.0000 mL | Freq: Once | INTRAVENOUS | Status: AC | PRN
  Administered 2014-07-04: 100 mL via INTRAVENOUS

## 2014-07-04 MED ORDER — ASPIRIN 81 MG PO CHEW: 324.0000 mg | CHEWABLE_TABLET | Freq: Once | ORAL | Status: DC

## 2014-07-04 MED ORDER — SODIUM CHLORIDE 0.9 % IV SOLN
8.0000 mg/h | INTRAVENOUS | Status: DC
  Administered 2014-07-04 – 2014-07-06 (×3): 8 mg/h via INTRAVENOUS
  Filled 2014-07-04 (×10): qty 80

## 2014-07-04 MED ORDER — DIPHENHYDRAMINE HCL 50 MG/ML IJ SOLN
INTRAMUSCULAR | Status: AC
  Filled 2014-07-04: qty 1

## 2014-07-04 MED ORDER — INSULIN ASPART 100 UNIT/ML ~~LOC~~ SOLN
0.0000 [IU] | SUBCUTANEOUS | Status: DC
  Administered 2014-07-05 (×2): 2 [IU] via SUBCUTANEOUS
  Administered 2014-07-06: 1 [IU] via SUBCUTANEOUS
  Administered 2014-07-06: 3 [IU] via SUBCUTANEOUS
  Administered 2014-07-07: 1 [IU] via SUBCUTANEOUS
  Administered 2014-07-07: 2 [IU] via SUBCUTANEOUS
  Administered 2014-07-08: 1 [IU] via SUBCUTANEOUS

## 2014-07-04 MED ORDER — SODIUM CHLORIDE 0.9 % IV SOLN: INTRAVENOUS | Status: DC

## 2014-07-04 MED ORDER — SODIUM CHLORIDE 0.9 % IV SOLN
250.0000 mg | Freq: Four times a day (QID) | INTRAVENOUS | Status: DC
  Filled 2014-07-04: qty 5

## 2014-07-04 MED ORDER — SODIUM CHLORIDE 0.9 % IV SOLN: Freq: Once | INTRAVENOUS | Status: DC

## 2014-07-04 MED ORDER — SODIUM CHLORIDE 0.9 % IV SOLN
Freq: Once | INTRAVENOUS | Status: AC
  Administered 2014-07-04: 09:00:00 via INTRAVENOUS

## 2014-07-04 MED ORDER — MIDAZOLAM HCL 10 MG/2ML IJ SOLN
INTRAMUSCULAR | Status: DC | PRN
  Administered 2014-07-04 (×2): 2 mg via INTRAVENOUS
  Administered 2014-07-04: 1 mg via INTRAVENOUS

## 2014-07-04 MED ORDER — PANTOPRAZOLE SODIUM 40 MG IV SOLR
80.0000 mg | Freq: Once | INTRAVENOUS | Status: AC
  Administered 2014-07-04: 80 mg via INTRAVENOUS
  Filled 2014-07-04: qty 80

## 2014-07-04 MED ORDER — INSULIN ASPART 100 UNIT/ML ~~LOC~~ SOLN: 0.0000 [IU] | SUBCUTANEOUS | Status: DC

## 2014-07-04 MED ORDER — SODIUM CHLORIDE 0.9 % IJ SOLN
3.0000 mL | Freq: Two times a day (BID) | INTRAMUSCULAR | Status: DC
  Administered 2014-07-04 – 2014-07-09 (×9): 3 mL via INTRAVENOUS

## 2014-07-04 MED ORDER — MIDAZOLAM HCL 5 MG/ML IJ SOLN
INTRAMUSCULAR | Status: AC
  Filled 2014-07-04: qty 2

## 2014-07-04 MED ORDER — FENTANYL CITRATE (PF) 100 MCG/2ML IJ SOLN
INTRAMUSCULAR | Status: DC | PRN
  Administered 2014-07-04 (×2): 25 ug via INTRAVENOUS

## 2014-07-04 NOTE — ED Notes (Signed)
Pt arrives from home via [facility] c/o CP and SOB. Pt reports new onset chest pain yesterday afternoon, states feeling increasing SOB, increasing fatigue, weakness, over past 2 weeks.  EMS reports pt has been without electricity and water at home for 6-7 months.  Pt appears pale.

## 2014-07-04 NOTE — ED Notes (Signed)
Family at bedside---Peter Weiss [phone] -- pt states ok to share information with this person.  PLEASE NOTIFY WHEN PT GOES UPSTAIRS.

## 2014-07-04 NOTE — Care Management Note (Addendum)
    Page 1 of 2   07/09/2014     4:31:49 PM CARE MANAGEMENT NOTE 07/09/2014  Patient:  Peter Weiss,Peter Weiss   Account Number:  [id number]  Date Initiated:  07/04/2014  Documentation initiated by:  Peter Weiss  Subjective/Objective Assessment:   adm w  hypotension     Action/Plan:   lives at home, pcp dr Peter Weiss   Anticipated DC Date:  07/10/2014   Anticipated DC Plan:  HOME/SELF CARE      DC Planning Services  Follow-up appt scheduled  CM consult      Choice offered to / List presented to:             Status of service:  Completed, signed off Medicare Important Message given?  YES (If response is "NO", the following Medicare IM given date fields will be blank) Date Medicare IM given:  07/08/2014 Medicare IM given by:  Peter Weiss Date Additional Medicare IM given:   Additional Medicare IM given by:    Discharge Disposition:  HOME/SELF CARE  Per UR Regulation:  Reviewed for med. necessity/level of care/duration of stay  If discussed at Long Length of Stay Meetings, dates discussed:   07/09/2014    Comments:  07/09/14- 1600- Peter PieriniKristi Yumiko Alkins RN, BSN [phone] Pt for d/c home today, CSW informed CM that pt needed assistance with medications- spoke with pt at bedside- confirmed that [facility] - but pt states that he has no money until next week when he gets his medicare check- pt also reports that he needs a new PCP thinks he has an appointment with someone on May 6 but does not remember with whom. Per epic pt was dismissed from Naomie Deanarrie Doss at [facility] in [place]- [facility] to [facility] for f/u appointment there on April 29 at 3:30 pt is agreeable to going there if he can find transportation- informed pt to call and reschedule appointment if he can not find transportation- as pt has insurance pt is not eligible for any assistance with medications- [facility] which is listed as one that pt has used- spoke with Peter Weiss there- who reported that the last  time pt filled medications was Jan. 9- pt's copays per script is $2 with a max of $8 if he gets a 3 mo supply. Discussed with Peter Weiss pt's money situation- [facility] willing to work with pt if he can pay a little on medications since copay is so low until pt gets paid next week. Pt informed of above and that he would need to use [facility] as they would be the pharmacy that would work with him on getting his meds - pt states that he doesn't really like them but willing to go and try. No other CM needs noted, CSW has seen pt for other assistance.  4/22 1353 Peter dowell rn,bsn spoke w nse and pt concerned about buying meds. pt has [facility] medicare so pt has ins for meds. went to see pt but he was in restroom and was unable to speak w pt. will ask weekend cm to speak w pt.

## 2014-07-04 NOTE — ED Provider Notes (Signed)
CSN: [id number]     Arrival date & time 07/04/14  0347 History   First MD Initiated Contact with Patient 07/04/14 0350     Chief Complaint  Patient presents with  . Chest Pain  . Fatigue     (Consider location/radiation/quality/duration/timing/severity/associated sxs/prior Treatment) Patient is a 74 y.o. male presenting with shortness of breath.  Shortness of Breath Severity:  Moderate Onset quality:  Gradual Duration:  2 weeks Timing:  Constant Progression:  Worsening Chronicity:  New Context comment:  Aortic stenosis Relieved by:  Nothing Worsened by:  Nothing tried Associated symptoms: chest pain (mild, pt cannot describe further) and cough   Associated symptoms: no abdominal pain, no fever and no rash     Past Medical History  Diagnosis Date  . Arthritis   . Chicken pox   . Heart murmur   . Hypertension   . Hyperlipidemia   . Urinary incontinence   . History of fainting spells of unknown cause   . Diabetes mellitus without complication   . Depression   . Aortic stenosis    Past Surgical History  Procedure Laterality Date  . Goiter  2008  . Multiple extractions with alveoloplasty N/A 04/24/2014    Procedure: Extraction of tooth #'s 2,3,5,12,13,14,15,20 with allveoloplasty and gross debridement of remaining dentition and sectioning of bridge at mesial of abutment tooth #32.;  Surgeon: Charlynne Pander, DDS;  Location: [facility] OR;  Service: Oral Surgery;  Laterality: N/A;  . Aortic valve replacement N/A 04/26/2014    Procedure: AORTIC VALVE REPLACEMENT (AVR);  Surgeon: Loreli Slot, MD;  Location: [facility] OR;  Service: Open Heart Surgery;  Laterality: N/A;  . Maze N/A 04/26/2014    Procedure: MAZE;  Surgeon: Loreli Slot, MD;  Location: [facility] OR;  Service: Open Heart Surgery;  Laterality: N/A;  . Tee without cardioversion N/A 04/26/2014    Procedure: TRANSESOPHAGEAL ECHOCARDIOGRAM (TEE);  Surgeon: Loreli Slot, MD;  Location: [facility] OR;  Service: Open Heart  Surgery;  Laterality: N/A;   Family History  Problem Relation Age of Onset  . Alcohol abuse Mother   . Heart disease Mother   . Diabetes Mother   . Heart disease Father   . Diabetes Father   . Cancer Mother   . Cancer Father   . Alcohol abuse Father   . Alcohol abuse Brother    History  Substance Use Topics  . Smoking status: Never Smoker   . Smokeless tobacco: Never Used  . Alcohol Use: No    Review of Systems  Constitutional: Negative for fever.  Respiratory: Positive for cough and shortness of breath.   Cardiovascular: Positive for chest pain (mild, pt cannot describe further).  Gastrointestinal: Negative for abdominal pain.  Skin: Negative for rash.  All other systems reviewed and are negative.     Allergies  Review of patient's allergies indicates no known allergies.  Home Medications   Prior to Admission medications   Medication Sig Start Date End Date Taking? Authorizing Provider  apixaban (ELIQUIS) 2.5 MG TABS tablet Take 1 tablet (2.5 mg total) by mouth 2 (two) times daily. 05/02/14  Yes Erin R Barrett, PA-C  aspirin 325 MG tablet Take 325 mg by mouth daily.   Yes Historical Provider, MD  sertraline (ZOLOFT) 50 MG tablet Take 50 mg by mouth daily.   Yes Historical Provider, MD  aspirin EC 81 MG tablet Take 4 tablets (325 mg total) by mouth daily. Patient not taking: Reported on 07/04/2014 05/02/14   Denny Peon  R Barrett, PA-C  donepezil (ARICEPT) 10 MG tablet Take 10 mg by mouth at bedtime.    Historical Provider, MD  furosemide (LASIX) 20 MG tablet Take 1 tablet (20 mg total) by mouth daily. Patient not taking: Reported on 07/04/2014 05/17/14   Loreli Slot, MD  gabapentin (NEURONTIN) 400 MG capsule Take 400 mg by mouth 3 (three) times daily.    Historical Provider, MD  metoprolol tartrate (LOPRESSOR) 25 MG tablet Take 0.5 tablets (12.5 mg total) by mouth 2 (two) times daily. Patient not taking: Reported on 07/04/2014 05/17/14   Loreli Slot, MD  Multiple  Vitamin (MULTIVITAMIN) capsule Take 1 capsule by mouth daily.    Historical Provider, MD  omeprazole (PRILOSEC) 20 MG capsule Take 20 mg by mouth daily.    Historical Provider, MD  oxycodone (ROXICODONE) 30 MG immediate release tablet Take 30 mg by mouth every 6 (six) hours as needed for pain.    Historical Provider, MD  Oxycodone HCl 10 MG TABS Take 1 tablet (10 mg total) by mouth 3 (three) times daily. Patient not taking: Reported on 07/04/2014 09/28/13   Lorre Munroe, NP  potassium chloride (K-DUR,KLOR-CON) 10 MEQ tablet Take 1 tablet (10 mEq total) by mouth daily. Patient not taking: Reported on 07/04/2014 05/17/14   Loreli Slot, MD  simvastatin (ZOCOR) 20 MG tablet Take 1 tablet (20 mg total) by mouth daily. Patient not taking: Reported on 07/04/2014 05/17/14   Loreli Slot, MD  tamsulosin (FLOMAX) 0.4 MG CAPS capsule Take 0.4 mg by mouth daily.    Historical Provider, MD  venlafaxine XR (EFFEXOR-XR) 150 MG 24 hr capsule Take 150 mg by mouth daily with breakfast.    Historical Provider, MD   BP 101/59 mmHg  Pulse 110  Temp(Src) 98.4 F (36.9 C) (Oral)  Resp 19  Ht  (1.803 m)  Wt 238 lb 1.6 oz (108 kg)  BMI 33.22 kg/m2  SpO2 98% Physical Exam  Constitutional: He is oriented to person, place, and time. He appears well-developed and well-nourished.  HENT:  Head: Normocephalic and atraumatic.  Eyes: Conjunctivae and EOM are normal.  Neck: Normal range of motion. Neck supple.  Cardiovascular: Normal rate, regular rhythm and normal heart sounds.   Pulmonary/Chest: Effort normal and breath sounds normal. No respiratory distress.  Abdominal: He exhibits no distension. There is tenderness in the left lower quadrant. There is no rebound and no guarding.  Musculoskeletal: Normal range of motion.  Neurological: He is alert and oriented to person, place, and time.  Skin: Skin is warm and dry.  Vitals reviewed.   ED Course  Procedures (including critical care time) Labs  Review Labs Reviewed  CBC - Abnormal; Notable for the following:    RBC 2.90 (*)    Hemoglobin 8.1 (*)    HCT 25.6 (*)    RDW 15.8 (*)    All other components within normal limits  COMPREHENSIVE METABOLIC PANEL - Abnormal; Notable for the following:    Glucose, Bld 207 (*)    BUN 66 (*)    Total Protein 5.7 (*)    Albumin 3.1 (*)    GFR calc non Af Amer 73 (*)    GFR calc Af Amer 85 (*)    All other components within normal limits  TROPONIN I - Abnormal; Notable for the following:    Troponin I 0.05 (*)    All other components within normal limits  HEMOGLOBIN AND HEMATOCRIT, BLOOD - Abnormal; Notable for the  following:    Hemoglobin 7.0 (*)    HCT 22.1 (*)    All other components within normal limits  BASIC METABOLIC PANEL - Abnormal; Notable for the following:    Glucose, Bld 156 (*)    BUN 63 (*)    GFR calc non Af Amer 85 (*)    All other components within normal limits  URINALYSIS, ROUTINE W REFLEX MICROSCOPIC - Abnormal; Notable for the following:    Specific Gravity, Urine <1.005 (*)    All other components within normal limits  HEMOGLOBIN AND HEMATOCRIT, BLOOD - Abnormal; Notable for the following:    Hemoglobin 7.5 (*)    HCT 23.3 (*)    All other components within normal limits  HEMOGLOBIN AND HEMATOCRIT, BLOOD - Abnormal; Notable for the following:    Hemoglobin 6.8 (*)    HCT 21.4 (*)    All other components within normal limits  GLUCOSE, CAPILLARY - Abnormal; Notable for the following:    Glucose-Capillary 120 (*)    All other components within normal limits  GLUCOSE, CAPILLARY - Abnormal; Notable for the following:    Glucose-Capillary 133 (*)    All other components within normal limits  I-STAT CG4 LACTIC ACID, ED - Abnormal; Notable for the following:    Lactic Acid, Venous 2.54 (*)    All other components within normal limits  I-STAT CHEM 8, ED - Abnormal; Notable for the following:    BUN 68 (*)    Glucose, Bld 197 (*)    Hemoglobin 8.5 (*)    HCT  25.0 (*)    All other components within normal limits  POC OCCULT BLOOD, ED - Abnormal; Notable for the following:    Fecal Occult Bld POSITIVE (*)    All other components within normal limits  MRSA PCR SCREENING  CLOSTRIDIUM DIFFICILE BY PCR  URINE CULTURE  BRAIN NATRIURETIC PEPTIDE  PROTIME-INR  GI PATHOGEN PANEL BY PCR, STOOL  VITAMIN B12  FOLATE  IRON AND TIBC  FERRITIN  HEMOGLOBIN AND HEMATOCRIT, BLOOD  HEMOGLOBIN A1C  BASIC METABOLIC PANEL  CBC WITH DIFFERENTIAL/PLATELET  PHOSPHORUS  MAGNESIUM  HEMOGLOBIN AND HEMATOCRIT, BLOOD  I-STAT TROPOININ, ED  I-STAT CG4 LACTIC ACID, ED  I-STAT CG4 LACTIC ACID, ED  TYPE AND SCREEN  PREPARE RBC (CROSSMATCH)  PREPARE RBC (CROSSMATCH)  PREPARE RBC (CROSSMATCH)  SURGICAL PATHOLOGY    Imaging Review Ct Angio Chest Pe W/cm &/or Wo Cm  07/04/2014   CLINICAL DATA:  Midline chest pain with midline abdominal discomfort. History of aortic valve replacement. Symptoms began earlier today.  EXAM: CT ANGIOGRAPHY CHEST  CT ABDOMEN AND PELVIS WITH CONTRAST  TECHNIQUE: Multidetector CT imaging of the chest was performed using the standard protocol during bolus administration of intravenous contrast. Multiplanar CT image reconstructions and MIPs were obtained to evaluate the vascular anatomy. Multidetector CT imaging of the abdomen and pelvis was performed using the standard protocol during bolus administration of intravenous contrast.  CONTRAST:  OMNIPAQUE IOHEXOL 350 MG/ML SOLN  COMPARISON:  [facility] chest [facility]   12/07/2010.  FINDINGS: CTA CHEST FINDINGS  Respiratory degradation. Small pulmonary nodules could be overlooked.  Mediastinum: No filling defects are noted within the pulmonary arterial tree to suggest pulmonary emboli. Heart size is increased. Advanced coronary artery calcification. Prior median sternotomy for AVR. Calcified mitral annulus. No pericardial fluid, thickening or calcification. No acute abnormality of the  thoracic aorta or other great vessels of the mediastinum. No pathologically enlarged mediastinal or hilar lymph  nodes. The esophagus is normal in appearance. Dilated ascending aorta measuring 4.3 cm transverse dimension is seen on coronal image 31. Recommend annual imaging followup by CTA or MRA. This recommendation follows 2010 ACCF/AHA/AATS/ACR/ASA/SCA/SCAI/SIR/STS/SVM Guidelines for the Diagnosis and Management of Patients with Thoracic Aortic Disease. Circulation. 2010; 121: Z610-R604  Lungs/Pleura: No consolidative airspace disease. No pleural effusions. No suspicious appearing pulmonary nodules or masses.  Musculoskeletal: No aggressive appearing lytic or blastic lesions are noted in the visualized portions of the skeleton.  CT ABDOMEN and PELVIS FINDINGS  BODY WALL: Unremarkable.  ABDOMEN/PELVIS:  Liver: No focal abnormality.  Small incidental granuloma LEFT lobe.  Biliary: No evidence of biliary obstruction or stone.  Pancreas: Unremarkable.  Spleen: Unremarkable.  Adrenals: Unremarkable.  Kidneys and ureters: No hydronephrosis or stone.  Bladder: Unremarkable.  Reproductive: Unremarkable.  Bowel: No obstruction. Normal appendix.  Retroperitoneum: No mass or adenopathy.  Peritoneum: No free fluid or gas.  Vascular: No acute abnormality.  OSSEOUS: No acute abnormalities.  Spondylosis.  Review of the MIP images confirms the above findings.  IMPRESSION: No acute chest findings are evident. No evidence for pulmonary emboli.  Respiratory motion degrades examination for small pulmonary nodules.  Ascending aortic aneurysm 4.3 cm diameter, roughly stable from 2012.  No acute intra-abdominal findings.   Electronically Signed   By: Davonna Belling M.D.   On: 07/04/2014 07:23   Ct Abdomen Pelvis W Contrast  07/04/2014   CLINICAL DATA:  Midline chest pain with midline abdominal discomfort. History of aortic valve replacement. Symptoms began earlier today.  EXAM: CT ANGIOGRAPHY CHEST  CT ABDOMEN AND PELVIS WITH CONTRAST   TECHNIQUE: Multidetector CT imaging of the chest was performed using the standard protocol during bolus administration of intravenous contrast. Multiplanar CT image reconstructions and MIPs were obtained to evaluate the vascular anatomy. Multidetector CT imaging of the abdomen and pelvis was performed using the standard protocol during bolus administration of intravenous contrast.  CONTRAST:  OMNIPAQUE IOHEXOL 350 MG/ML SOLN  COMPARISON:  CT chest from [facility]   12/07/2010.  FINDINGS: CTA CHEST FINDINGS  Respiratory degradation. Small pulmonary nodules could be overlooked.  Mediastinum: No filling defects are noted within the pulmonary arterial tree to suggest pulmonary emboli. Heart size is increased. Advanced coronary artery calcification. Prior median sternotomy for AVR. Calcified mitral annulus. No pericardial fluid, thickening or calcification. No acute abnormality of the thoracic aorta or other great vessels of the mediastinum. No pathologically enlarged mediastinal or hilar lymph nodes. The esophagus is normal in appearance. Dilated ascending aorta measuring 4.3 cm transverse dimension is seen on coronal image 31. Recommend annual imaging followup by CTA or MRA. This recommendation follows 2010 ACCF/AHA/AATS/ACR/ASA/SCA/SCAI/SIR/STS/SVM Guidelines for the Diagnosis and Management of Patients with Thoracic Aortic Disease. Circulation. 2010; 121: V409-W119  Lungs/Pleura: No consolidative airspace disease. No pleural effusions. No suspicious appearing pulmonary nodules or masses.  Musculoskeletal: No aggressive appearing lytic or blastic lesions are noted in the visualized portions of the skeleton.  CT ABDOMEN and PELVIS FINDINGS  BODY WALL: Unremarkable.  ABDOMEN/PELVIS:  Liver: No focal abnormality.  Small incidental granuloma LEFT lobe.  Biliary: No evidence of biliary obstruction or stone.  Pancreas: Unremarkable.  Spleen: Unremarkable.  Adrenals: Unremarkable.  Kidneys and ureters: No  hydronephrosis or stone.  Bladder: Unremarkable.  Reproductive: Unremarkable.  Bowel: No obstruction. Normal appendix.  Retroperitoneum: No mass or adenopathy.  Peritoneum: No free fluid or gas.  Vascular: No acute abnormality.  OSSEOUS: No acute abnormalities.  Spondylosis.  Review of the MIP  images confirms the above findings.  IMPRESSION: No acute chest findings are evident. No evidence for pulmonary emboli.  Respiratory motion degrades examination for small pulmonary nodules.  Ascending aortic aneurysm 4.3 cm diameter, roughly stable from 2012.  No acute intra-abdominal findings.   Electronically Signed   By: Davonna BellingJohn  Curnes M.D.   On: 07/04/2014 07:23   Dg Chest Port 1 View  07/04/2014   CLINICAL DATA:  Chest pain and shortness of breath  EXAM: PORTABLE CHEST - 1 VIEW  COMPARISON:  05/28/2014  FINDINGS: Chronic cardiomegaly. The patient is status post aortic valve replacement and left atrial appendage clip. Hypoventilation. There is no edema, consolidation, effusion, or pneumothorax. No osseous findings to explain chest pain.  IMPRESSION: No active disease.   Electronically Signed   By: Marnee SpringJonathon  Watts M.D.   On: 07/04/2014 04:23     EKG Interpretation   Date/Time:  Thursday July 04 2014 04:16:35 EDT Ventricular Rate:  129 PR Interval:    QRS Duration: 143 QT Interval:  365 QTC Calculation: 535 R Axis:   -125 Text Interpretation:  Junctional tachycardia Right bundle branch block  Inferior infarct, old No significant change since last tracing Confirmed  by Mirian MoGentry, Evelio Rueda [phone]([id number]) on 07/04/2014 4:28:58 AM     CRITICAL CARE Performed by: Mirian MoMatthew Aleyssa Pike   Total critical care time: 82 min  Critical care time was exclusive of separately billable procedures and treating other patients.  Critical care was necessary to treat or prevent imminent or life-threatening deterioration.  Critical care was time spent personally by me on the following activities: development of treatment plan with  patient and/or surrogate as well as nursing, discussions with consultants, evaluation of patient's response to treatment, examination of patient, obtaining history from patient or surrogate, ordering and performing treatments and interventions, ordering and review of laboratory studies, ordering and review of radiographic studies, pulse oximetry and re-evaluation of patient's condition.  MDM   Final diagnoses:  Melena    74 y.o. male with pertinent PMH of aortic stenosis sp repair 2 months ago presents with dyspnea, chest pain as above.  Hypotensive on arrival. Ordered NS bolus x 2L.  This improved BP.  Pt noncompliant with medication.  No fevers, infectious symptoms otherwise.  Pt does endorse dark stools x 2 weeks.  Physical exam as above.  Melena present.  Likely hemorrhagic or mixed shock.  Admitted to stepdown.  I have reviewed all laboratory and imaging studies if ordered as above  1. Chest pain, unspecified chest pain type   2. Melena         Mirian MoMatthew Kyesha Balla, MD 07/04/14 [phone]

## 2014-07-04 NOTE — Consult Note (Signed)
Unassigned Consult  Reason for Consult: Hemorrhagic shock Referring Physician: [facility]  Elly Modenahomas Nakyla Bracco Fare HPI: This is a 74 year old male with a PMH of a bovine AVR on 04/2014, HTN, DM, and hyperlipidemia who is admitted for hemorrhagic shock.  He started to have symptoms of melena one week ago and last night he felt too weak to walk.  As a result of his symptoms he called EMS.  In the ER he was noted to be hypotensive at 68/48 and an elevated HR in the 120-130's.  His HGB was in the 9 range and then it dropped down to 7.1.  ABM imaging was negative for any acute findings.  With IV hydration his BP has increased.  Incidentally, the patient is reported to have poor living conditions and he has not taken his medications, namely Eliquis.  Past Medical History  Diagnosis Date  . Arthritis   . Chicken pox   . Heart murmur   . Hypertension   . Hyperlipidemia   . Urinary incontinence   . History of fainting spells of unknown cause   . Diabetes mellitus without complication   . Depression   . Aortic stenosis     Past Surgical History  Procedure Laterality Date  . Goiter  2008  . Multiple extractions with alveoloplasty N/A 04/24/2014    Procedure: Extraction of tooth #'s 2,3,5,12,13,14,15,20 with allveoloplasty and gross debridement of remaining dentition and sectioning of bridge at mesial of abutment tooth #32.;  Surgeon: Charlynne Panderonald F Kulinski, DDS;  [facility];  Service: Oral Surgery;  Laterality: N/A;  . Aortic valve replacement N/A 04/26/2014    Procedure: AORTIC VALVE REPLACEMENT (AVR);  Surgeon: Loreli SlotSteven C Hendrickson, MD;  [facility];  Service: Open Heart Surgery;  Laterality: N/A;  . Maze N/A 04/26/2014    Procedure: MAZE;  Surgeon: Loreli SlotSteven C Hendrickson, MD;  [facility];  Service: Open Heart Surgery;  Laterality: N/A;  . Tee without cardioversion N/A 04/26/2014    Procedure: TRANSESOPHAGEAL ECHOCARDIOGRAM (TEE);  Surgeon: Loreli SlotSteven C Hendrickson, MD;  [facility];   Service: Open Heart Surgery;  Laterality: N/A;    Family History  Problem Relation Age of Onset  . Alcohol abuse Mother   . Heart disease Mother   . Diabetes Mother   . Heart disease Father   . Diabetes Father   . Cancer Mother   . Cancer Father   . Alcohol abuse Father   . Alcohol abuse Brother     Social History:  reports that he has never smoked. He has never used smokeless tobacco. He reports that he does not drink alcohol or use illicit drugs.  Allergies: No Known Allergies  Medications:  Scheduled: . sodium chloride   Intravenous Once  . insulin aspart  0-9 Units Subcutaneous 6 times per day  . sodium chloride  3 mL Intravenous Q12H   Continuous: . pantoprozole (PROTONIX) infusion 8 mg/hr (07/04/14 0940)    Results for orders placed or performed during the hospital encounter of 07/04/14 (from the past 24 hour(s))  CBC     Status: Abnormal   Collection Time: 07/04/14  4:05 AM  Result Value Ref Range   WBC 9.1 4.0 - 10.5 K/uL   RBC 2.90 (L) 4.22 - 5.81 MIL/uL   Hemoglobin 8.1 (L) 13.0 - 17.0 g/dL   HCT 16.125.6 (L) 09.639.0 - 04.552.0 %   MCV 88.3 78.0 - 100.0 fL   MCH 27.9 26.0 - 34.0 pg   MCHC 31.6  30.0 - 36.0 g/dL   RDW 16.1 (H) 09.6 - 04.5 %   Platelets 211 150 - 400 K/uL  Comprehensive metabolic panel     Status: Abnormal   Collection Time: 07/04/14  4:05 AM  Result Value Ref Range   Sodium 142 135 - 145 mmol/L   Potassium 3.8 3.5 - 5.1 mmol/L   Chloride 110 96 - 112 mmol/L   CO2 20 19 - 32 mmol/L   Glucose, Bld 207 (H) 70 - 99 mg/dL   BUN 66 (H) 6 - 23 mg/dL   Creatinine, Ser 4.09 0.50 - 1.35 mg/dL   Calcium 9.1 8.4 - 81.1 mg/dL   Total Protein 5.7 (L) 6.0 - 8.3 g/dL   Albumin 3.1 (L) 3.5 - 5.2 g/dL   AST 14 0 - 37 U/L   ALT 12 0 - 53 U/L   Alkaline Phosphatase 43 39 - 117 U/L   Total Bilirubin 0.5 0.3 - 1.2 mg/dL   GFR calc non Af Amer 73 (L) >90 mL/min   GFR calc Af Amer 85 (L) >90 mL/min   Anion gap 12 5 - 15  I-stat troponin, ED     Status: None    Collection Time: 07/04/14  4:12 AM  Result Value Ref Range   Troponin i, poc 0.03 0.00 - 0.08 ng/mL   Comment 3          Brain natriuretic peptide     Status: None   Collection Time: 07/04/14  4:25 AM  Result Value Ref Range   B Natriuretic Peptide 67.0 0.0 - 100.0 pg/mL  I-Stat Chem 8, ED     Status: Abnormal   Collection Time: 07/04/14  4:29 AM  Result Value Ref Range   Sodium 141 135 - 145 mmol/L   Potassium 3.8 3.5 - 5.1 mmol/L   Chloride 109 96 - 112 mmol/L   BUN 68 (H) 6 - 23 mg/dL   Creatinine, Ser 9.14 0.50 - 1.35 mg/dL   Glucose, Bld 782 (H) 70 - 99 mg/dL   Calcium, Ion 9.56 2.13 - 1.30 mmol/L   TCO2 18 0 - 100 mmol/L   Hemoglobin 8.5 (L) 13.0 - 17.0 g/dL   HCT 08.6 (L) 57.8 - 46.9 %  I-Stat CG4 Lactic Acid, ED     Status: Abnormal   Collection Time: 07/04/14  4:30 AM  Result Value Ref Range   Lactic Acid, Venous 2.54 (HH) 0.5 - 2.0 mmol/L   Comment NOTIFIED PHYSICIAN   POC occult blood, ED RN will collect     Status: Abnormal   Collection Time: 07/04/14  5:05 AM  Result Value Ref Range   Fecal Occult Bld POSITIVE (A) NEGATIVE  Type and screen     Status: None (Preliminary result)   Collection Time: 07/04/14  5:33 AM  Result Value Ref Range   ABO/RH(D) O POS    Antibody Screen NEG    Sample Expiration 07/07/2014    Unit Number [id number]    Blood Component Type RBC LR PHER1    Unit division 00    Status of Unit ISSUED    Transfusion Status OK TO TRANSFUSE    Crossmatch Result Compatible   I-Stat CG4 Lactic Acid, ED     Status: None   Collection Time: 07/04/14  7:29 AM  Result Value Ref Range   Lactic Acid, Venous 1.51 0.5 - 2.0 mmol/L  Prepare RBC     Status: None   Collection Time: 07/04/14  8:34 AM  Result  Value Ref Range   Order Confirmation ORDER PROCESSED BY BLOOD BANK   Troponin I (q 6hr x 3)     Status: Abnormal   Collection Time: 07/04/14  8:50 AM  Result Value Ref Range   Troponin I 0.05 (H) <0.031 ng/mL  Protime-INR     Status: None    Collection Time: 07/04/14  8:50 AM  Result Value Ref Range   Prothrombin Time 15.1 11.6 - 15.2 seconds   INR 1.18 0.00 - 1.49  Hemoglobin and hematocrit, blood     Status: Abnormal   Collection Time: 07/04/14  8:50 AM  Result Value Ref Range   Hemoglobin 7.0 (L) 13.0 - 17.0 g/dL   HCT 10.2 (L) 72.5 - 36.6 %  Basic metabolic panel     Status: Abnormal   Collection Time: 07/04/14  8:50 AM  Result Value Ref Range   Sodium 142 135 - 145 mmol/L   Potassium 3.8 3.5 - 5.1 mmol/L   Chloride 112 96 - 112 mmol/L   CO2 22 19 - 32 mmol/L   Glucose, Bld 156 (H) 70 - 99 mg/dL   BUN 63 (H) 6 - 23 mg/dL   Creatinine, Ser 4.40 0.50 - 1.35 mg/dL   Calcium 8.6 8.4 - 34.7 mg/dL   GFR calc non Af Amer 85 (L) >90 mL/min   GFR calc Af Amer >90 >90 mL/min   Anion gap 8 5 - 15  Urinalysis, Routine w reflex microscopic     Status: Abnormal   Collection Time: 07/04/14 10:03 AM  Result Value Ref Range   Color, Urine YELLOW YELLOW   APPearance CLEAR CLEAR   Specific Gravity, Urine <1.005 (L) 1.005 - 1.030   pH 5.0 5.0 - 8.0   Glucose, UA NEGATIVE NEGATIVE mg/dL   Hgb urine dipstick NEGATIVE NEGATIVE   Bilirubin Urine NEGATIVE NEGATIVE   Ketones, ur NEGATIVE NEGATIVE mg/dL   Protein, ur NEGATIVE NEGATIVE mg/dL   Urobilinogen, UA 0.2 0.0 - 1.0 mg/dL   Nitrite NEGATIVE NEGATIVE   Leukocytes, UA NEGATIVE NEGATIVE     Ct Angio Chest Pe W/cm &/or Wo Cm  07/04/2014   CLINICAL DATA:  Midline chest pain with midline abdominal discomfort. History of aortic valve replacement. Symptoms began earlier today.  EXAM: CT ANGIOGRAPHY CHEST  CT ABDOMEN AND PELVIS WITH CONTRAST  TECHNIQUE: Multidetector CT imaging of the chest was performed using the standard protocol during bolus administration of intravenous contrast. Multiplanar CT image reconstructions and MIPs were obtained to evaluate the vascular anatomy. Multidetector CT imaging of the abdomen and pelvis was performed using the standard protocol during bolus  administration of intravenous contrast.  CONTRAST:  OMNIPAQUE IOHEXOL 350 MG/ML SOLN  COMPARISON:  CT chest from [facility]   12/07/2010.  FINDINGS: CTA CHEST FINDINGS  Respiratory degradation. Small pulmonary nodules could be overlooked.  Mediastinum: No filling defects are noted within the pulmonary arterial tree to suggest pulmonary emboli. Heart size is increased. Advanced coronary artery calcification. Prior median sternotomy for AVR. Calcified mitral annulus. No pericardial fluid, thickening or calcification. No acute abnormality of the thoracic aorta or other great vessels of the mediastinum. No pathologically enlarged mediastinal or hilar lymph nodes. The esophagus is normal in appearance. Dilated ascending aorta measuring 4.3 cm transverse dimension is seen on coronal image 31. Recommend annual imaging followup by CTA or MRA. This recommendation follows 2010 ACCF/AHA/AATS/ACR/ASA/SCA/SCAI/SIR/STS/SVM Guidelines for the Diagnosis and Management of Patients with Thoracic Aortic Disease. Circulation. 2010; 121: Q259-D638  Lungs/Pleura: No  consolidative airspace disease. No pleural effusions. No suspicious appearing pulmonary nodules or masses.  Musculoskeletal: No aggressive appearing lytic or blastic lesions are noted in the visualized portions of the skeleton.  CT ABDOMEN and PELVIS FINDINGS  BODY WALL: Unremarkable.  ABDOMEN/PELVIS:  Liver: No focal abnormality.  Small incidental granuloma LEFT lobe.  Biliary: No evidence of biliary obstruction or stone.  Pancreas: Unremarkable.  Spleen: Unremarkable.  Adrenals: Unremarkable.  Kidneys and ureters: No hydronephrosis or stone.  Bladder: Unremarkable.  Reproductive: Unremarkable.  Bowel: No obstruction. Normal appendix.  Retroperitoneum: No mass or adenopathy.  Peritoneum: No free fluid or gas.  Vascular: No acute abnormality.  OSSEOUS: No acute abnormalities.  Spondylosis.  Review of the MIP images confirms the above findings.  IMPRESSION: No  acute chest findings are evident. No evidence for pulmonary emboli.  Respiratory motion degrades examination for small pulmonary nodules.  Ascending aortic aneurysm 4.3 cm diameter, roughly stable from 2012.  No acute intra-abdominal findings.   Electronically Signed   By: Davonna Belling M.D.   On: 07/04/2014 07:23   Ct Abdomen Pelvis W Contrast  07/04/2014   CLINICAL DATA:  Midline chest pain with midline abdominal discomfort. History of aortic valve replacement. Symptoms began earlier today.  EXAM: CT ANGIOGRAPHY CHEST  CT ABDOMEN AND PELVIS WITH CONTRAST  TECHNIQUE: Multidetector CT imaging of the chest was performed using the standard protocol during bolus administration of intravenous contrast. Multiplanar CT image reconstructions and MIPs were obtained to evaluate the vascular anatomy. Multidetector CT imaging of the abdomen and pelvis was performed using the standard protocol during bolus administration of intravenous contrast.  CONTRAST:  OMNIPAQUE IOHEXOL 350 MG/ML SOLN  COMPARISON:  CT chest from [facility]   12/07/2010.  FINDINGS: CTA CHEST FINDINGS  Respiratory degradation. Small pulmonary nodules could be overlooked.  Mediastinum: No filling defects are noted within the pulmonary arterial tree to suggest pulmonary emboli. Heart size is increased. Advanced coronary artery calcification. Prior median sternotomy for AVR. Calcified mitral annulus. No pericardial fluid, thickening or calcification. No acute abnormality of the thoracic aorta or other great vessels of the mediastinum. No pathologically enlarged mediastinal or hilar lymph nodes. The esophagus is normal in appearance. Dilated ascending aorta measuring 4.3 cm transverse dimension is seen on coronal image 31. Recommend annual imaging followup by CTA or MRA. This recommendation follows 2010 ACCF/AHA/AATS/ACR/ASA/SCA/SCAI/SIR/STS/SVM Guidelines for the Diagnosis and Management of Patients with Thoracic Aortic Disease. Circulation.  2010; 121: G992-E268  Lungs/Pleura: No consolidative airspace disease. No pleural effusions. No suspicious appearing pulmonary nodules or masses.  Musculoskeletal: No aggressive appearing lytic or blastic lesions are noted in the visualized portions of the skeleton.  CT ABDOMEN and PELVIS FINDINGS  BODY WALL: Unremarkable.  ABDOMEN/PELVIS:  Liver: No focal abnormality.  Small incidental granuloma LEFT lobe.  Biliary: No evidence of biliary obstruction or stone.  Pancreas: Unremarkable.  Spleen: Unremarkable.  Adrenals: Unremarkable.  Kidneys and ureters: No hydronephrosis or stone.  Bladder: Unremarkable.  Reproductive: Unremarkable.  Bowel: No obstruction. Normal appendix.  Retroperitoneum: No mass or adenopathy.  Peritoneum: No free fluid or gas.  Vascular: No acute abnormality.  OSSEOUS: No acute abnormalities.  Spondylosis.  Review of the MIP images confirms the above findings.  IMPRESSION: No acute chest findings are evident. No evidence for pulmonary emboli.  Respiratory motion degrades examination for small pulmonary nodules.  Ascending aortic aneurysm 4.3 cm diameter, roughly stable from 2012.  No acute intra-abdominal findings.   Electronically Signed   By: Davonna Belling  M.D.   On: 07/04/2014 07:23   Dg Chest Port 1 View  07/04/2014   CLINICAL DATA:  Chest pain and shortness of breath  EXAM: PORTABLE CHEST - 1 VIEW  COMPARISON:  05/28/2014  FINDINGS: Chronic cardiomegaly. The patient is status post aortic valve replacement and left atrial appendage clip. Hypoventilation. There is no edema, consolidation, effusion, or pneumothorax. No osseous findings to explain chest pain.  IMPRESSION: No active disease.   Electronically Signed   By: Marnee Spring M.D.   On: 07/04/2014 04:23    ROS:  As stated above in the HPI otherwise negative.  Blood pressure 126/74, pulse 121, temperature 97.7 F (36.5 C), temperature source Oral, resp. rate 17, height 5\' 11"  (1.803 m), weight 106.595 kg (235 lb), SpO2 100 %.     PE: Gen: NAD, Alert and Oriented, foul mood HEENT:  [place]/AT, EOMI Neck: Supple, no LAD Lungs: CTA Bilaterally CV: RRR without M/G/R ABM: Soft, NTND, +BS, tachycardic Ext: No C/C/E  Assessment/Plan: 1) Melena. 2) Anemia. 3) Hypotension.   The patient is in a foul mood and he asked me to look in the chart to obtain his history, but he did deny any prior history of PUD or GI bleed.  Currently he is stable.    Plan: 1) Emergent EGD further recommendations pending the findings. 2) Agree with transfusions.  Granvel Proudfoot D 07/04/2014, 1:11 PM
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
## 2014-07-04 NOTE — Op Note (Signed)
[facility] [address] [place], [id number]   ENDOSCOPY PROCEDURE REPORT  PATIENT: Peter Weiss, [name]  MR#: #[id number] BIRTHDATE: 04-15-1940 , 74  yrs. old GENDER: male ENDOSCOPIST:Chanequa Spees Elnoria HowardHung, MD REFERRED BY: PROCEDURE DATE:  07/04/2014 PROCEDURE:   EGD w/ biopsy ASA CLASS:    Class III INDICATIONS: melena. MEDICATION: Fentanyl 50 mcg IV and Versed 4 mg IV TOPICAL ANESTHETIC:   none  DESCRIPTION OF PROCEDURE:   After the risks and benefits of the procedure were explained, informed consent was obtained.  The [employer] [id number]  endoscope was introduced through the mouth  and advanced to the second portion of the duodenum .  The instrument was slowly withdrawn as the mucosa was fully examined. Estimated blood loss is zero unless otherwise noted in this procedure report.   FINDINGS: A small sliding hiatal hernia was identified.  The Z-line was sharp and no evidence of any inflammation in the esophagus.  in the gastric lumen there was a diffuse patchy gastritis.  There were some nonbleeding erosions and evidence of healed ulcers.  Cold biopsies of the gastritis were obtained.  In the transition from D1 to D2 there were two nonbleeding clean-based ulcers.  A clear view of both ulcers were note possible as a result of the edema of the mucosal folds and the natural turn of the duodenum.  No evidence of any active bleeding.    Retroflexed views revealed no abnormalities.    The scope was then withdrawn from the patient and the procedure completed.  COMPLICATIONS: There were no immediate complications.  ENDOSCOPIC IMPRESSION: 1) Duodenal ulcers - nonbleeding. 2) Gastritis with nonbleeding erosions.  RECOMMENDATIONS: 1) Follow up biopsy results. 2) Treat for H. pylori if positive. 3) PPI QD indefinitely with the severity of his presentation.   _______________________________ eSignedJeani Hawking:  Wilena Tyndall, MD 07/04/2014 4:31  PM     cc:  CPT CODES: ICD CODES:  The ICD and CPT codes recommended by this software are interpretations from the data that the clinical staff has captured with the software.  The verification of the translation of this report to the ICD and CPT codes and modifiers is the sole responsibility of the health care institution and practicing physician where this report was generated.  [employer] will not be held responsible for the validity of the ICD and CPT codes included on this report.  AMA assumes no liability for data contained or not contained herein. CPT is a Publishing rights managerregistered trademark of the [employer].  PATIENT NAME:  Peter Weiss, Yi Vertis Bauder MR#: #[id number]

## 2014-07-04 NOTE — H&P (Signed)
[facility] History and Physical                                                                                    Peter Weiss, is a 74 y.o. male  MRN: [id number]   DOB - August 27, 1940  Admit Date - 07/04/2014  Outpatient Primary MD for the patient is Doss, Oleh Genin, NP   Referring Physician:  Dr. Littie Deeds, EDP  Chief Complaint:   Chief Complaint  Patient presents with  . Chest Pain  . Fatigue     HPI  Peter Weiss  is a 74 y.o. male, who is an unassigned patient, with a PMH of Bovine AVR placed 04/2014 by Dr. Dorris Fetch, DM, HTN, HLD, who presents to the ER in hemorrhagic shock.  Peter Weiss reports he was unable to walk last night so he called EMS.  He reports black  Loose stools for the last two weeks. He denies ever having black stools before. No GERD, no abdominal pain, no vomiting.  He has never seen a gastroenterologist.  He also describes chest pain that has been constant since his AVR surgery.  It is a pressure sensation over his left breast that is non radiating.  He does not indicate that it has become worse recently.  Peter Weiss describes very poor living conditions - no electricity, no hot water, no transportation.  He reports that he has been out of all of his medications for two weeks (inlcluding eliquis) but has continued to take his aspirin.  His UDS is positive for opiates and benzodiazepines. He also reports he had an appointment today to see his cardiologist, but felt very weak and dizzy when he tried to stand up , associated with some chest pain.  In the ER the patients vitals were as low as BP 68/48 with pulse rates in the 120s- 130s.  Hgb was initially 8.1 but dropped to 7.0 with hydration.  Lactic acid was 2.5.  Guiac was positive with black melena.  CTA Chest was negative for PE but showed an aortic aneurysm of 4.3 cm.  CT abdomen/pelvis showed nothing acute. CXR was negative for edema or infiltrate.  [name] BP initially responded to IVF, but subsequently dropped  again.  PCCM and GI (Dr. Elnoria Howard) were consulted.  Review of Systems   In addition to the HPI above,  No Fever-chills, No Headache, No changes with Vision or hearing, No problems swallowing food or Liquids, +Complains of urinary frequency. No new skin rashes or bruises, No new joints pains-aches,  No new weakness, tingling, numbness in any extremity, No recent weight gain or loss, A full 10 point Review of Systems was done, except as stated above, all other Review of Systems were negative.  Past Medical History  Past Medical History  Diagnosis Date  . Arthritis   . Chicken pox   . Heart murmur   . Hypertension   . Hyperlipidemia   . Urinary incontinence   . History of fainting spells of unknown cause   . Diabetes mellitus without complication   . Depression   . Aortic stenosis     Past Surgical History  Procedure Laterality Date  .  Goiter  2008  . Multiple extractions with alveoloplasty N/A 04/24/2014    Procedure: Extraction of tooth #'s 2,3,5,12,13,14,15,20 with allveoloplasty and gross debridement of remaining dentition and sectioning of bridge at mesial of abutment tooth #32.;  Surgeon: Charlynne Panderonald F Kulinski, DDS;  [facility];  Service: Oral Surgery;  Laterality: N/A;  . Aortic valve replacement N/A 04/26/2014    Procedure: AORTIC VALVE REPLACEMENT (AVR);  Surgeon: Loreli SlotSteven C Hendrickson, MD;  [facility];  Service: Open Heart Surgery;  Laterality: N/A;  . Maze N/A 04/26/2014    Procedure: MAZE;  Surgeon: Loreli SlotSteven C Hendrickson, MD;  [facility];  Service: Open Heart Surgery;  Laterality: N/A;  . Tee without cardioversion N/A 04/26/2014    Procedure: TRANSESOPHAGEAL ECHOCARDIOGRAM (TEE);  Surgeon: Loreli SlotSteven C Hendrickson, MD;  [facility];  Service: Open Heart Surgery;  Laterality: N/A;      Social History History  Substance Use Topics  . Smoking status: Never Smoker   . Smokeless tobacco: Never Used  . Alcohol Use: No  Cut back on his alcohol use - Now drinks  about 2 beers per week.  Smokes marijuana about 2x a month.  Lives at home alone.  2 sons live in [place].  Was ambulatory until today.  Family History Family History  Problem Relation Age of Onset  . Alcohol abuse Mother   . Heart disease Mother   . Diabetes Mother   . Heart disease Father   . Diabetes Father   . Cancer Mother   . Cancer Father   . Alcohol abuse Father   . Alcohol abuse Brother     Prior to Admission medications   Medication Sig Start Date End Date Taking? Authorizing Provider  apixaban (ELIQUIS) 2.5 MG TABS tablet Take 1 tablet (2.5 mg total) by mouth 2 (two) times daily. 05/02/14  Yes Erin R Barrett, PA-C  aspirin 325 MG tablet Take 325 mg by mouth daily.   Yes Historical Provider, MD  sertraline (ZOLOFT) 50 MG tablet Take 50 mg by mouth daily.   Yes Historical Provider, MD  aspirin EC 81 MG tablet Take 4 tablets (325 mg total) by mouth daily. Patient not taking: Reported on 07/04/2014 05/02/14   Erin R Barrett, PA-C  donepezil (ARICEPT) 10 MG tablet Take 10 mg by mouth at bedtime.    Historical Provider, MD  furosemide (LASIX) 20 MG tablet Take 1 tablet (20 mg total) by mouth daily. Patient not taking: Reported on 07/04/2014 05/17/14   Loreli SlotSteven C Hendrickson, MD  gabapentin (NEURONTIN) 400 MG capsule Take 400 mg by mouth 3 (three) times daily.    Historical Provider, MD  metoprolol tartrate (LOPRESSOR) 25 MG tablet Take 0.5 tablets (12.5 mg total) by mouth 2 (two) times daily. Patient not taking: Reported on 07/04/2014 05/17/14   Loreli SlotSteven C Hendrickson, MD  Multiple Vitamin (MULTIVITAMIN) capsule Take 1 capsule by mouth daily.    Historical Provider, MD  omeprazole (PRILOSEC) 20 MG capsule Take 20 mg by mouth daily.    Historical Provider, MD  oxycodone (ROXICODONE) 30 MG immediate release tablet Take 30 mg by mouth every 6 (six) hours as needed for pain.    Historical Provider, MD  Oxycodone HCl 10 MG TABS Take 1 tablet (10 mg total) by mouth 3 (three) times daily. Patient  not taking: Reported on 07/04/2014 09/28/13   Lorre Munroeegina W Baity, NP  potassium chloride (K-DUR,KLOR-CON) 10 MEQ tablet Take 1 tablet (10 mEq total) by mouth daily. Patient not taking: Reported on  07/04/2014 05/17/14   Loreli Slot, MD  simvastatin (ZOCOR) 20 MG tablet Take 1 tablet (20 mg total) by mouth daily. Patient not taking: Reported on 07/04/2014 05/17/14   Loreli Slot, MD  tamsulosin (FLOMAX) 0.4 MG CAPS capsule Take 0.4 mg by mouth daily.    Historical Provider, MD  venlafaxine XR (EFFEXOR-XR) 150 MG 24 hr capsule Take 150 mg by mouth daily with breakfast.    Historical Provider, MD    No Known Allergies  Physical Exam  Vitals  Blood pressure 123/47, pulse 120, temperature 98.1 F (36.7 C), temperature source Oral, resp. rate 15, height  (1.803 m), weight 106.595 kg (235 lb), SpO2 100 %.   General:  Wd, overweight, disagreeable male, lying in bed in NAD, Pale in color.  Psych:  Sarcastic, but cooperative.  Insight appears poor.  Grooming is very poor.   Patient has dirty feet and a foul body odor.  Neuro:   No F.N deficits, ALL C.Nerves Intact, Strength 5/5 all 4 extremities, Sensation intact all 4 extremities.  ENT:  Ears and Eyes appear Normal, Conjunctivae pale, PER. Moist oral mucosa without erythema or exudates.  Neck:  Supple, No lymphadenopathy appreciated  Respiratory:  Symmetrical chest wall movement, Good air movement bilaterally, CTAB.  Cardiac:  Tachycardic, No Murmurs, no LE edema noted, no JVD.    Abdomen:  Decreased bowel sounds, soft, diffusely tender to palpation thru out.  Unable to detect aortic bruit  Skin:  Pale.  Lower extremities appear mottled but are warm.  Extremities:  Able to move all 4. 5/5 strength in each,  no effusions.  Data Review  CBC  Recent Labs Lab 07/04/14 0405 07/04/14 0429 07/04/14 0850  WBC 9.1  --   --   HGB 8.1* 8.5* 7.0*  HCT 25.6* 25.0* 22.1*  PLT 211  --   --   MCV 88.3  --   --   MCH 27.9  --    --   MCHC 31.6  --   --   RDW 15.8*  --   --     Chemistries   Recent Labs Lab 07/04/14 0405 07/04/14 0429 07/04/14 0850  NA 142 141 142  K 3.8 3.8 3.8  CL 110 109 112  CO2 20  --  22  GLUCOSE 207* 197* 156*  BUN 66* 68* 63*  CREATININE 1.00 0.90 0.85  CALCIUM 9.1  --  8.6  AST 14  --   --   ALT 12  --   --   ALKPHOS 43  --   --   BILITOT 0.5  --   --     Urinalysis is pending  Imaging results:   Ct Angio Chest Pe W/cm &/or Wo Cm  07/04/2014   CLINICAL DATA:  Midline chest pain with midline abdominal discomfort. History of aortic valve replacement. Symptoms began earlier today.  EXAM: CT ANGIOGRAPHY CHEST  CT ABDOMEN AND PELVIS WITH CONTRAST  TECHNIQUE: Multidetector CT imaging of the chest was performed using the standard protocol during bolus administration of intravenous contrast. Multiplanar CT image reconstructions and MIPs were obtained to evaluate the vascular anatomy. Multidetector CT imaging of the abdomen and pelvis was performed using the standard protocol during bolus administration of intravenous contrast.  CONTRAST:  OMNIPAQUE IOHEXOL 350 MG/ML SOLN  COMPARISON:  CT chest [facility]   12/07/2010.  FINDINGS: CTA CHEST FINDINGS  Respiratory degradation. Small pulmonary nodules could be overlooked.  Mediastinum: No filling defects  are noted within the pulmonary arterial tree to suggest pulmonary emboli. Heart size is increased. Advanced coronary artery calcification. Prior median sternotomy for AVR. Calcified mitral annulus. No pericardial fluid, thickening or calcification. No acute abnormality of the thoracic aorta or other great vessels of the mediastinum. No pathologically enlarged mediastinal or hilar lymph nodes. The esophagus is normal in appearance. Dilated ascending aorta measuring 4.3 cm transverse dimension is seen on coronal image 31. Recommend annual imaging followup by CTA or MRA. This recommendation follows 2010  ACCF/AHA/AATS/ACR/ASA/SCA/SCAI/SIR/STS/SVM Guidelines for the Diagnosis and Management of Patients with Thoracic Aortic Disease. Circulation. 2010; 121: E092-Z300  Lungs/Pleura: No consolidative airspace disease. No pleural effusions. No suspicious appearing pulmonary nodules or masses.  Musculoskeletal: No aggressive appearing lytic or blastic lesions are noted in the visualized portions of the skeleton.  CT ABDOMEN and PELVIS FINDINGS  BODY WALL: Unremarkable.  ABDOMEN/PELVIS:  Liver: No focal abnormality.  Small incidental granuloma LEFT lobe.  Biliary: No evidence of biliary obstruction or stone.  Pancreas: Unremarkable.  Spleen: Unremarkable.  Adrenals: Unremarkable.  Kidneys and ureters: No hydronephrosis or stone.  Bladder: Unremarkable.  Reproductive: Unremarkable.  Bowel: No obstruction. Normal appendix.  Retroperitoneum: No mass or adenopathy.  Peritoneum: No free fluid or gas.  Vascular: No acute abnormality.  OSSEOUS: No acute abnormalities.  Spondylosis.  Review of the MIP images confirms the above findings.  IMPRESSION: No acute chest findings are evident. No evidence for pulmonary emboli.  Respiratory motion degrades examination for small pulmonary nodules.  Ascending aortic aneurysm 4.3 cm diameter, roughly stable from 2012.  No acute intra-abdominal findings.   Electronically Signed   By: Davonna Belling M.D.   On: 07/04/2014 07:23   Ct Abdomen Pelvis W Contrast  07/04/2014   CLINICAL DATA:  Midline chest pain with midline abdominal discomfort. History of aortic valve replacement. Symptoms began earlier today.  EXAM: CT ANGIOGRAPHY CHEST  CT ABDOMEN AND PELVIS WITH CONTRAST  TECHNIQUE: Multidetector CT imaging of the chest was performed using the standard protocol during bolus administration of intravenous contrast. Multiplanar CT image reconstructions and MIPs were obtained to evaluate the vascular anatomy. Multidetector CT imaging of the abdomen and pelvis was performed using the standard protocol  during bolus administration of intravenous contrast.  CONTRAST:  OMNIPAQUE IOHEXOL 350 MG/ML SOLN  COMPARISON:  CT chest from [facility]   12/07/2010.  FINDINGS: CTA CHEST FINDINGS  Respiratory degradation. Small pulmonary nodules could be overlooked.  Mediastinum: No filling defects are noted within the pulmonary arterial tree to suggest pulmonary emboli. Heart size is increased. Advanced coronary artery calcification. Prior median sternotomy for AVR. Calcified mitral annulus. No pericardial fluid, thickening or calcification. No acute abnormality of the thoracic aorta or other great vessels of the mediastinum. No pathologically enlarged mediastinal or hilar lymph nodes. The esophagus is normal in appearance. Dilated ascending aorta measuring 4.3 cm transverse dimension is seen on coronal image 31. Recommend annual imaging followup by CTA or MRA. This recommendation follows 2010 ACCF/AHA/AATS/ACR/ASA/SCA/SCAI/SIR/STS/SVM Guidelines for the Diagnosis and Management of Patients with Thoracic Aortic Disease. Circulation. 2010; 121: T622-Q333  Lungs/Pleura: No consolidative airspace disease. No pleural effusions. No suspicious appearing pulmonary nodules or masses.  Musculoskeletal: No aggressive appearing lytic or blastic lesions are noted in the visualized portions of the skeleton.  CT ABDOMEN and PELVIS FINDINGS  BODY WALL: Unremarkable.  ABDOMEN/PELVIS:  Liver: No focal abnormality.  Small incidental granuloma LEFT lobe.  Biliary: No evidence of biliary obstruction or stone.  Pancreas: Unremarkable.  Spleen:  Unremarkable.  Adrenals: Unremarkable.  Kidneys and ureters: No hydronephrosis or stone.  Bladder: Unremarkable.  Reproductive: Unremarkable.  Bowel: No obstruction. Normal appendix.  Retroperitoneum: No mass or adenopathy.  Peritoneum: No free fluid or gas.  Vascular: No acute abnormality.  OSSEOUS: No acute abnormalities.  Spondylosis.  Review of the MIP images confirms the above findings.   IMPRESSION: No acute chest findings are evident. No evidence for pulmonary emboli.  Respiratory motion degrades examination for small pulmonary nodules.  Ascending aortic aneurysm 4.3 cm diameter, roughly stable from 2012.  No acute intra-abdominal findings.   Electronically Signed   By: Davonna Belling M.D.   On: 07/04/2014 07:23   Dg Chest Port 1 View  07/04/2014   CLINICAL DATA:  Chest pain and shortness of breath  EXAM: PORTABLE CHEST - 1 VIEW  COMPARISON:  05/28/2014  FINDINGS: Chronic cardiomegaly. The patient is status post aortic valve replacement and left atrial appendage clip. Hypoventilation. There is no edema, consolidation, effusion, or pneumothorax. No osseous findings to explain chest pain.  IMPRESSION: No active disease.   Electronically Signed   By: Marnee Spring M.D.   On: 07/04/2014 04:23    My personal review of EKG: Junctional tachycardia with rate in 120/min with RBBB.  Reviewed over the phone with Cardiology PA-C.   Assessment & Plan  Principal Problem:   Hemorrhagic shock Active Problems:   Chronic back pain   Mild cognitive impairment   HLD (hyperlipidemia)   HTN (hypertension)   Adjustment disorder with mixed anxiety and depressed mood   S/P AVR (aortic valve replacement)   Chest pain   Gastrointestinal hemorrhage   Hemorrhagic Shock Secondary to upper GI bleed progressively worsening over the past week. Patient will be admitted to step down.  PCCM consulted in case he needs pressors.  Upper GI Bleed (hgb 7.0) with acute blood loss anemia. Patient takes a daily aspirin.  He has been off of eliquis for at least 2 weeks. BUN elevated at 68 with a creatinine of 0.9.  Heme positive with black melena 2 units of blood have been ordered.   I spoke with Dr. Loreta Ave who stated Dr. Elnoria Howard would see the patient. CBC q 6.  Protonix drip ordered.  NPO until seen by GI.  Diet per GI. Please transfuse to keep hemoglobin greater than 7.  Chest pain Patient reports this has  been on going since his surgery by may have become worse recently. Troponin slightly elevated at 0.05.  Discussed with Cards PA-C.  Will continue to cycle trop and call cardiology if they rise significantly. Last Cardiac Cath was 03/2014.  Mild LAD disease 30%. EKG shows tachycardia, no ischemic changes .    Recent AVR. Bovine Tissue Valve. Dr. Dorris Fetch made aware that the patient has been admitted. CTS and GI will need to determine anticoag/antiplatelet therapy going further.  Aortic Aneurysm 4.3 centimeters.  Outpatient follow up annually.   HTN. Currently hypotensive.  Out of BP medications at home. BP meds will need to be restarted as he improves over the course of his hospitalization.  Adjustment disorder. Currently NPO.  Will need to restart Effexor and Zoloft when he is able to take pills.  Chronic Pain Patient has chronic back and knee pain.  He is on Oxycodone for this.  Given his hypotension,  I will only order low dose oxy PRN with holding parameters at this time.  HLD Restart statin when patient is able to take POs.  Urinary frequency Please follow U/A.  Social Patient is very poorly groomed and describes deplorable living conditions. Social work consulted.   Consultants Called:  PCCM, GI (Dr. Loreta Ave)  Family Communication:   Family lives in [place].  Patient is alert and orientated.  Code Status:  Full  Condition:  Guarded  Potential Disposition: Uncertain.  LOS estimated at 3 days.  May need placement at discharge.  Time spent in minutes : [address],  PA-C on 07/04/2014 at 11:41 AM Between 7am to 7pm - Pager - [phone] After 7pm go to [email or web address] - password [email or web address] And look for the night coverage person covering me after hours  [facility]   74 year old gentleman with h/o recent AVR comes in for hemorrhagic shock. He is being volume resuscitated and 2 units of prbc transfusions ordered. Please transfuse to keep hemoglobin  greater than 7. Holding all anticoagulation at this time and GI consulted for further recommendations. PCCM on board, to see if he needs pressors later on.   Exam: Patient is alert afebrile and in mild distress.  CVS s1s2, regular.  Lungs diminished air sounds at bases.  Abdomen : soft tender in the left upper and left lower quadrant, bowel sounds decreased on the left.  Extremities:trace pedal edema.  Neuro:he is alert , oriented to place, person and time, no focal deficits.    In addition to the above, will get C diff pcr and gi pathogen pcr.   Kathlen Mody, MD [phone]

## 2014-07-04 NOTE — Consult Note (Signed)
Name: Peter Weiss MRN: [id number] DOB: 03-08-1941    ADMISSION DATE:  07/04/2014 CONSULTATION DATE:  4/21  REFERRING MD :  [facility]   CHIEF COMPLAINT:  Hypotension   BRIEF PATIENT DESCRIPTION: 74 yo male with hx HTN, DM, severe AS s/p MAZE and aortic valve replacement (bovine) 04/26/2014.  Was initially on eliquis but has not had any meds in last 2 weeks r/t financial distress.  Presented 4/21 with SOB, chest pain and 2 week hx melena.  Was hypotensive in ER with SBP 60's but responded to volume and was admitted by [facility].  Later in morning had recurrent hypotension and PCCM consulted.   SIGNIFICANT EVENTS    STUDIES:  2D echo 4/21>>>  CTA chest 4/21>>> neg PE, stable ascending aortic aneurysm CT abd/pelvis 4/21>>> neg acute    HISTORY OF PRESENT ILLNESS:  74 yo male with hx HTN, DM, severe AS s/p MAZE and bovine AVR on 04/26/2014.  Was initially on eliquis but has not had any meds in last 2 weeks r/t financial distress. Presented 4/21 with SOB, chest pain and 2 week hx melena.  Was hypotensive in ER with SBP 60's but responded to volume and was admitted by [facility].  Later in morning had recurrent hypotension and PCCM consulted.  Per pt has had progressive dyspnea over last 2-3 weeks, acutely worse in last 24 hours.  He has had ongoing dark, tarry stools without any frank blood.  Also c/o intermittent chest pressure which is non-radiating, without associated diaphoresis or lightheadedness.  Denies hemoptysis, purulent sputum, fevers, chills, abd pain, n/v.    Note: SW to see regarding finances.  Pt with no electricity at home, difficulty buying medications/food. Pt states he pays a neighbor $100/month to run an extension cord for a space heater. [facility] working with Social Work.    PAST MEDICAL HISTORY :   has a past medical history of Arthritis; Chicken pox; Heart murmur; Hypertension; Hyperlipidemia; Urinary incontinence; History of fainting spells of unknown cause; Diabetes mellitus  without complication; Depression; and Aortic stenosis.  has past surgical history that includes goiter (2008); Multiple extractions with alveoloplasty (N/A, 04/24/2014); Aortic valve replacement (N/A, 04/26/2014); MAZE (N/A, 04/26/2014); and TEE without cardioversion (N/A, 04/26/2014). Prior to Admission medications   Medication Sig Start Date End Date Taking? Authorizing Provider  apixaban (ELIQUIS) 2.5 MG TABS tablet Take 1 tablet (2.5 mg total) by mouth 2 (two) times daily. 05/02/14  Yes Erin R Barrett, PA-C  aspirin 325 MG tablet Take 325 mg by mouth daily.   Yes Historical Provider, MD  sertraline (ZOLOFT) 50 MG tablet Take 50 mg by mouth daily.   Yes Historical Provider, MD  aspirin EC 81 MG tablet Take 4 tablets (325 mg total) by mouth daily. Patient not taking: Reported on 07/04/2014 05/02/14   Erin R Barrett, PA-C  donepezil (ARICEPT) 10 MG tablet Take 10 mg by mouth at bedtime.    Historical Provider, MD  furosemide (LASIX) 20 MG tablet Take 1 tablet (20 mg total) by mouth daily. Patient not taking: Reported on 07/04/2014 05/17/14   Loreli Slot, MD  gabapentin (NEURONTIN) 400 MG capsule Take 400 mg by mouth 3 (three) times daily.    Historical Provider, MD  metoprolol tartrate (LOPRESSOR) 25 MG tablet Take 0.5 tablets (12.5 mg total) by mouth 2 (two) times daily. Patient not taking: Reported on 07/04/2014 05/17/14   Loreli Slot, MD  Multiple Vitamin (MULTIVITAMIN) capsule Take 1 capsule by mouth daily.    Historical Provider,  MD  omeprazole (PRILOSEC) 20 MG capsule Take 20 mg by mouth daily.    Historical Provider, MD  oxycodone (ROXICODONE) 30 MG immediate release tablet Take 30 mg by mouth every 6 (six) hours as needed for pain.    Historical Provider, MD  Oxycodone HCl 10 MG TABS Take 1 tablet (10 mg total) by mouth 3 (three) times daily. Patient not taking: Reported on 07/04/2014 09/28/13   Lorre Munroeegina W Baity, NP  potassium chloride (K-DUR,KLOR-CON) 10 MEQ tablet Take 1 tablet (10  mEq total) by mouth daily. Patient not taking: Reported on 07/04/2014 05/17/14   Loreli SlotSteven C Hendrickson, MD  simvastatin (ZOCOR) 20 MG tablet Take 1 tablet (20 mg total) by mouth daily. Patient not taking: Reported on 07/04/2014 05/17/14   Loreli SlotSteven C Hendrickson, MD  tamsulosin (FLOMAX) 0.4 MG CAPS capsule Take 0.4 mg by mouth daily.    Historical Provider, MD  venlafaxine XR (EFFEXOR-XR) 150 MG 24 hr capsule Take 150 mg by mouth daily with breakfast.    Historical Provider, MD   No Known Allergies  FAMILY HISTORY:  family history includes Alcohol abuse in his brother, father, and mother; Cancer in his father and mother; Diabetes in his father and mother; Heart disease in his father and mother. SOCIAL HISTORY:  reports that he has never smoked. He has never used smokeless tobacco. He reports that he does not drink alcohol or use illicit drugs.  REVIEW OF SYSTEMS:   As per HPI - All other systems reviewed and were neg.   SUBJECTIVE:   VITAL SIGNS: Temp:  [99 F (37.2 C)] 99 F (37.2 C) (04/21 0503) Pulse Rate:  [120-131] 122 (04/21 0845) Resp:  [13-29] 17 (04/21 0845) BP: (68-128)/(45-82) 101/74 mmHg (04/21 0845) SpO2:  [97 %-100 %] 100 % (04/21 0845) Weight:  [235 lb (106.595 kg)] 235 lb (106.595 kg) (04/21 0409)  PHYSICAL EXAMINATION: General:  Pleasant, chronically ill appearing, disheveled male, NAD  Neuro:  Awake, alert, appropriate, MAE  HEENT:  Mm dry, pale, no JVD  Cardiovascular:  s1s2 rrr Lungs:  resps even non labored on 2L [place], few scattered rhonchi  Abdomen:  Round, soft, mildly tender diffusely, +bs, no rebound or guarding  Musculoskeletal:  Pale, warm and dry, poor hygiene   Recent Labs Lab 07/04/14 0405 07/04/14 0429  NA 142 141  K 3.8 3.8  CL 110 109  CO2 20  --   BUN 66* 68*  CREATININE 1.00 0.90  GLUCOSE 207* 197*    Recent Labs Lab 07/04/14 0405 07/04/14 0429 07/04/14 0850  HGB 8.1* 8.5* 7.0*  HCT 25.6* 25.0* 22.1*  WBC 9.1  --   --   PLT 211  --    --    Ct Angio Chest Pe W/cm &/or Wo Cm  07/04/2014   CLINICAL DATA:  Midline chest pain with midline abdominal discomfort. History of aortic valve replacement. Symptoms began earlier today.  EXAM: CT ANGIOGRAPHY CHEST  CT ABDOMEN AND PELVIS WITH CONTRAST  TECHNIQUE: Multidetector CT imaging of the chest was performed using the standard protocol during bolus administration of intravenous contrast. Multiplanar CT image reconstructions and MIPs were obtained to evaluate the vascular anatomy. Multidetector CT imaging of the abdomen and pelvis was performed using the standard protocol during bolus administration of intravenous contrast.  CONTRAST:  100mL OMNIPAQUE IOHEXOL 350 MG/ML SOLN  COMPARISON:  [facility] chest [facility]   12/07/2010.  FINDINGS: CTA CHEST FINDINGS  Respiratory degradation. Small pulmonary nodules could be overlooked.  Mediastinum:  No filling defects are noted within the pulmonary arterial tree to suggest pulmonary emboli. Heart size is increased. Advanced coronary artery calcification. Prior median sternotomy for AVR. Calcified mitral annulus. No pericardial fluid, thickening or calcification. No acute abnormality of the thoracic aorta or other great vessels of the mediastinum. No pathologically enlarged mediastinal or hilar lymph nodes. The esophagus is normal in appearance. Dilated ascending aorta measuring 4.3 cm transverse dimension is seen on coronal image 31. Recommend annual imaging followup by CTA or MRA. This recommendation follows 2010 ACCF/AHA/AATS/ACR/ASA/SCA/SCAI/SIR/STS/SVM Guidelines for the Diagnosis and Management of Patients with Thoracic Aortic Disease. Circulation. 2010; 121: Z610-R604  Lungs/Pleura: No consolidative airspace disease. No pleural effusions. No suspicious appearing pulmonary nodules or masses.  Musculoskeletal: No aggressive appearing lytic or blastic lesions are noted in the visualized portions of the skeleton.  CT ABDOMEN and PELVIS FINDINGS  BODY  WALL: Unremarkable.  ABDOMEN/PELVIS:  Liver: No focal abnormality.  Small incidental granuloma LEFT lobe.  Biliary: No evidence of biliary obstruction or stone.  Pancreas: Unremarkable.  Spleen: Unremarkable.  Adrenals: Unremarkable.  Kidneys and ureters: No hydronephrosis or stone.  Bladder: Unremarkable.  Reproductive: Unremarkable.  Bowel: No obstruction. Normal appendix.  Retroperitoneum: No mass or adenopathy.  Peritoneum: No free fluid or gas.  Vascular: No acute abnormality.  OSSEOUS: No acute abnormalities.  Spondylosis.  Review of the MIP images confirms the above findings.  IMPRESSION: No acute chest findings are evident. No evidence for pulmonary emboli.  Respiratory motion degrades examination for small pulmonary nodules.  Ascending aortic aneurysm 4.3 cm diameter, roughly stable from 2012.  No acute intra-abdominal findings.   Electronically Signed   By: Davonna Belling M.D.   On: 07/04/2014 07:23   Ct Abdomen Pelvis W Contrast  07/04/2014   CLINICAL DATA:  Midline chest pain with midline abdominal discomfort. History of aortic valve replacement. Symptoms began earlier today.  EXAM: CT ANGIOGRAPHY CHEST  CT ABDOMEN AND PELVIS WITH CONTRAST  TECHNIQUE: Multidetector CT imaging of the chest was performed using the standard protocol during bolus administration of intravenous contrast. Multiplanar CT image reconstructions and MIPs were obtained to evaluate the vascular anatomy. Multidetector CT imaging of the abdomen and pelvis was performed using the standard protocol during bolus administration of intravenous contrast.  CONTRAST:  OMNIPAQUE IOHEXOL 350 MG/ML SOLN  COMPARISON:  CT chest from [facility]   12/07/2010.  FINDINGS: CTA CHEST FINDINGS  Respiratory degradation. Small pulmonary nodules could be overlooked.  Mediastinum: No filling defects are noted within the pulmonary arterial tree to suggest pulmonary emboli. Heart size is increased. Advanced coronary artery calcification. Prior  median sternotomy for AVR. Calcified mitral annulus. No pericardial fluid, thickening or calcification. No acute abnormality of the thoracic aorta or other great vessels of the mediastinum. No pathologically enlarged mediastinal or hilar lymph nodes. The esophagus is normal in appearance. Dilated ascending aorta measuring 4.3 cm transverse dimension is seen on coronal image 31. Recommend annual imaging followup by CTA or MRA. This recommendation follows 2010 ACCF/AHA/AATS/ACR/ASA/SCA/SCAI/SIR/STS/SVM Guidelines for the Diagnosis and Management of Patients with Thoracic Aortic Disease. Circulation. 2010; 121: V409-W119  Lungs/Pleura: No consolidative airspace disease. No pleural effusions. No suspicious appearing pulmonary nodules or masses.  Musculoskeletal: No aggressive appearing lytic or blastic lesions are noted in the visualized portions of the skeleton.  CT ABDOMEN and PELVIS FINDINGS  BODY WALL: Unremarkable.  ABDOMEN/PELVIS:  Liver: No focal abnormality.  Small incidental granuloma LEFT lobe.  Biliary: No evidence of biliary obstruction or stone.  Pancreas:  Unremarkable.  Spleen: Unremarkable.  Adrenals: Unremarkable.  Kidneys and ureters: No hydronephrosis or stone.  Bladder: Unremarkable.  Reproductive: Unremarkable.  Bowel: No obstruction. Normal appendix.  Retroperitoneum: No mass or adenopathy.  Peritoneum: No free fluid or gas.  Vascular: No acute abnormality.  OSSEOUS: No acute abnormalities.  Spondylosis.  Review of the MIP images confirms the above findings.  IMPRESSION: No acute chest findings are evident. No evidence for pulmonary emboli.  Respiratory motion degrades examination for small pulmonary nodules.  Ascending aortic aneurysm 4.3 cm diameter, roughly stable from 2012.  No acute intra-abdominal findings.   Electronically Signed   By: Davonna Belling M.D.   On: 07/04/2014 07:23   Dg Chest Port 1 View  07/04/2014   CLINICAL DATA:  Chest pain and shortness of breath  EXAM: PORTABLE CHEST - 1  VIEW  COMPARISON:  05/28/2014  FINDINGS: Chronic cardiomegaly. The patient is status post aortic valve replacement and left atrial appendage clip. Hypoventilation. There is no edema, consolidation, effusion, or pneumothorax. No osseous findings to explain chest pain.  IMPRESSION: No active disease.   Electronically Signed   By: Marnee Spring M.D.   On: 07/04/2014 04:23    ASSESSMENT / PLAN:  Hypotension Hemorrhagic shock - improved.  Transient episode of recurrent hypotension, now improved.  Should cont to improve with PRBC.  GI bleed  REC -  Ok for SDU  Cont gentle volume  transfusion PRBC pending  Frequent cbc  GI to see  PPI  NPO  F/u coags  S/p AVR  Chest pain  Tachycardia  REC -  Troponin q6 x 3  Echo results pending  Alert CVTS to admission  Hold anticoagulation (although not taking x 2 weeks per pt) F/u chem  F/u BNP    Ok for SDU admission per [facility].  BP marginal but improving with volume, PRBC still pending.  Lactate ok. Echo pending.   PCCM will follow with you.   Dirk Dress, NP 07/04/2014  9:32 AM Pager: [phone] or [phone]

## 2014-07-04 NOTE — Progress Notes (Signed)
LCSW aware of consult and attempted to see patient this morning. Patient working with staff and unable to participate at this time.  Once bed assigned, LCSW will transfer case to unit CSW to follow up.  Deretha EmoryHannah Gunhild Bautch LCSW, MSW Clinical Social Work: Emergency Room [phone]

## 2014-07-04 NOTE — Progress Notes (Signed)
  Echocardiogram 2D Echocardiogram has been performed. Attempted patient non compliant very limited echo.   Leta JunglingCooper, Jadesola Poynter M 07/04/2014, 9:24 AM

## 2014-07-04 NOTE — Progress Notes (Signed)
Placed pt. On cpap. Pt. Tolerating well at this time. 

## 2014-07-04 NOTE — Progress Notes (Signed)
RN paged earlier to see if pt could advance diet and to report a Hgb of 6.8. Pt had EGD at bedside this afternoon. No report on chart yet, but per RN, no active bleeding was found-only ulcers/gastritis. Pt on clear liquids now. Do not feel ready to advance diet given Hgb and procedure was only a few hours ago. Will allow pt to have decaf coffee on his diet but no food yet. Will defer advancement of diet to GI.  In re: Hgb. Will TF i unit PRBCs with H/H afterwards. KJKG, NP [facility]

## 2014-07-04 NOTE — Progress Notes (Signed)
      [address]       [place] [id number]             [phone]      Notified of patient's admission by Medical service  He is a 74 yo man who had an AVR, maze and clipping of his LA appendage in February.   He presented to ED with a 2 week history of black stools and being up able to get up for 24 hours. He was diagnosed with a GI bleed. He had a Hg of 7 after hydration. He was tachycardic and hypotensive. He had EGD today which showed ulcers not actively bleeding.  He is a difficult patient to get an accurate history. He reportedly said he was not taking any medications prior to admission, but tells me now that he was taking 2 pills a day but is not sure which ones they were.  He was on Eliquis for atrial fibrillation. He does not need any anticoagulation for his aortic valve now that he is over 8 weeks out from the AVR.   I'm not sure he is a good candidate for long term anticoagulation for atrial fib due to his non-compliance, but you may want to discuss this with Cardiology.  Would definitely not anticoagulate for now. His stroke risk is relatively low with the LA appendage having been clipped.  Salvatore DecentSteven C. Dorris FetchHendrickson, MD [facility] [phone]

## 2014-07-05 ENCOUNTER — Encounter (HOSPITAL_COMMUNITY): Payer: Self-pay | Admitting: Gastroenterology

## 2014-07-05 DIAGNOSIS — R350 Frequency of micturition: Secondary | ICD-10-CM

## 2014-07-05 DIAGNOSIS — Z954 Presence of other heart-valve replacement: Secondary | ICD-10-CM

## 2014-07-05 DIAGNOSIS — G894 Chronic pain syndrome: Secondary | ICD-10-CM

## 2014-07-05 DIAGNOSIS — F4323 Adjustment disorder with mixed anxiety and depressed mood: Secondary | ICD-10-CM

## 2014-07-05 DIAGNOSIS — M549 Dorsalgia, unspecified: Secondary | ICD-10-CM

## 2014-07-05 DIAGNOSIS — G8929 Other chronic pain: Secondary | ICD-10-CM

## 2014-07-05 DIAGNOSIS — K254 Chronic or unspecified gastric ulcer with hemorrhage: Secondary | ICD-10-CM

## 2014-07-05 DIAGNOSIS — E785 Hyperlipidemia, unspecified: Secondary | ICD-10-CM

## 2014-07-05 DIAGNOSIS — I10 Essential (primary) hypertension: Secondary | ICD-10-CM

## 2014-07-05 LAB — HEMOGLOBIN AND HEMATOCRIT, BLOOD
HCT: 23.6 % — ABNORMAL LOW (ref 39.0–52.0)
HCT: 21.8 % — ABNORMAL LOW (ref 39.0–52.0)
HEMATOCRIT: 23.3 % — AB (ref 39.0–52.0)
HEMATOCRIT: 22.5 % — AB (ref 39.0–52.0)
HEMOGLOBIN: 7.2 g/dL — AB (ref 13.0–17.0)
Hemoglobin: 7.4 g/dL — ABNORMAL LOW (ref 13.0–17.0)
Hemoglobin: 7.6 g/dL — ABNORMAL LOW (ref 13.0–17.0)
Hemoglobin: 6.9 g/dL — CL (ref 13.0–17.0)

## 2014-07-05 LAB — BASIC METABOLIC PANEL
Anion gap: 8 (ref 5–15)
BUN: 32 mg/dL — ABNORMAL HIGH (ref 6–23)
CO2: 21 mmol/L (ref 19–32)
Calcium: 8.3 mg/dL — ABNORMAL LOW (ref 8.4–10.5)
Chloride: 110 mmol/L (ref 96–112)
Creatinine, Ser: 0.89 mg/dL (ref 0.50–1.35)
GFR, EST NON AFRICAN AMERICAN: 83 mL/min — AB (ref >90)
GLUCOSE: 132 mg/dL — AB (ref 70–99)
POTASSIUM: 3.4 mmol/L — AB (ref 3.5–5.1)
SODIUM: 139 mmol/L (ref 135–145)

## 2014-07-05 LAB — URINE CULTURE
Colony Count: NO GROWTH
Culture: NO GROWTH

## 2014-07-05 LAB — CBC WITH DIFFERENTIAL/PLATELET
BASOS PCT: 0 % (ref 0–1)
Basophils Absolute: 0 10*3/uL (ref 0.0–0.1)
Eosinophils Absolute: 0.3 10*3/uL (ref 0.0–0.7)
Eosinophils Relative: 4 % (ref 0–5)
HEMATOCRIT: 23 % — AB (ref 39.0–52.0)
Hemoglobin: 7.4 g/dL — ABNORMAL LOW (ref 13.0–17.0)
Lymphocytes Relative: 14 % (ref 12–46)
Lymphs Abs: 1.3 10*3/uL (ref 0.7–4.0)
MCH: 28.6 pg (ref 26.0–34.0)
MCHC: 32.2 g/dL (ref 30.0–36.0)
MCV: 88.8 fL (ref 78.0–100.0)
MONO ABS: 0.6 10*3/uL (ref 0.1–1.0)
Monocytes Relative: 7 % (ref 3–12)
NEUTROS ABS: 6.9 10*3/uL (ref 1.7–7.7)
Neutrophils Relative %: 75 % (ref 43–77)
PLATELETS: 121 10*3/uL — AB (ref 150–400)
RBC: 2.59 MIL/uL — ABNORMAL LOW (ref 4.22–5.81)
RDW: 15.9 % — AB (ref 11.5–15.5)
WBC: 9.2 10*3/uL (ref 4.0–10.5)

## 2014-07-05 LAB — GLUCOSE, CAPILLARY
GLUCOSE-CAPILLARY: 109 mg/dL — AB (ref 70–99)
GLUCOSE-CAPILLARY: 152 mg/dL — AB (ref 70–99)
Glucose-Capillary: 131 mg/dL — ABNORMAL HIGH (ref 70–99)
Glucose-Capillary: 118 mg/dL — ABNORMAL HIGH (ref 70–99)
Glucose-Capillary: 176 mg/dL — ABNORMAL HIGH (ref 70–99)
Glucose-Capillary: 111 mg/dL — ABNORMAL HIGH (ref 70–99)

## 2014-07-05 LAB — PHOSPHORUS: Phosphorus: 3 mg/dL (ref 2.3–4.6)

## 2014-07-05 LAB — CLOSTRIDIUM DIFFICILE BY PCR: Toxigenic C. Difficile by PCR: NEGATIVE

## 2014-07-05 LAB — MAGNESIUM: Magnesium: 1.9 mg/dL (ref 1.5–2.5)

## 2014-07-05 LAB — HEMOGLOBIN A1C
HEMOGLOBIN A1C: 6.7 % — AB (ref 4.8–5.6)
Mean Plasma Glucose: 146 mg/dL

## 2014-07-05 LAB — PREPARE RBC (CROSSMATCH)

## 2014-07-05 LAB — IRON AND TIBC
IRON: 57 ug/dL (ref 42–165)
Saturation Ratios: 21 % (ref 20–55)
TIBC: 276 ug/dL (ref 215–435)
UIBC: 219 ug/dL (ref 125–400)

## 2014-07-05 LAB — VITAMIN B12: VITAMIN B 12: 339 pg/mL (ref 211–911)

## 2014-07-05 LAB — FOLATE: Folate: 18.8 ng/mL

## 2014-07-05 LAB — FERRITIN: Ferritin: 31 ng/mL (ref 22–322)

## 2014-07-05 MED ORDER — OXYCODONE HCL ER 15 MG PO T12A: 15.0000 mg | EXTENDED_RELEASE_TABLET | Freq: Two times a day (BID) | ORAL | Status: DC

## 2014-07-05 MED ORDER — BISACODYL 5 MG PO TBEC
5.0000 mg | DELAYED_RELEASE_TABLET | Freq: Two times a day (BID) | ORAL | Status: DC
  Administered 2014-07-05 – 2014-07-09 (×7): 5 mg via ORAL
  Filled 2014-07-05 (×7): qty 1

## 2014-07-05 MED ORDER — TAMSULOSIN HCL 0.4 MG PO CAPS
0.4000 mg | ORAL_CAPSULE | Freq: Every day | ORAL | Status: DC
  Administered 2014-07-05 – 2014-07-08 (×4): 0.4 mg via ORAL
  Filled 2014-07-05 (×5): qty 1

## 2014-07-05 MED ORDER — MORPHINE SULFATE 2 MG/ML IJ SOLN
2.0000 mg | INTRAMUSCULAR | Status: DC | PRN
  Administered 2014-07-09: 2 mg via INTRAVENOUS
  Filled 2014-07-05: qty 1

## 2014-07-05 MED ORDER — OXYCODONE HCL ER 15 MG PO T12A
15.0000 mg | EXTENDED_RELEASE_TABLET | Freq: Two times a day (BID) | ORAL | Status: DC
  Administered 2014-07-05 – 2014-07-09 (×8): 15 mg via ORAL
  Filled 2014-07-05 (×8): qty 1

## 2014-07-05 MED ORDER — SIMVASTATIN 20 MG PO TABS
20.0000 mg | ORAL_TABLET | Freq: Every day | ORAL | Status: DC
  Administered 2014-07-05 – 2014-07-08 (×4): 20 mg via ORAL
  Filled 2014-07-05 (×6): qty 1

## 2014-07-05 MED ORDER — SODIUM CHLORIDE 0.9 % IV SOLN
Freq: Once | INTRAVENOUS | Status: AC
  Administered 2014-07-05: 23:00:00 via INTRAVENOUS

## 2014-07-05 MED ORDER — SIMVASTATIN 40 MG PO TABS: 50.0000 mg | ORAL_TABLET | Freq: Every day | ORAL | Status: DC

## 2014-07-05 MED ORDER — HALOPERIDOL LACTATE 5 MG/ML IJ SOLN
3.0000 mg | Freq: Three times a day (TID) | INTRAMUSCULAR | Status: DC
  Administered 2014-07-05 – 2014-07-08 (×10): 3 mg via INTRAVENOUS
  Filled 2014-07-05 (×4): qty 0.6
  Filled 2014-07-05 (×3): qty 1
  Filled 2014-07-05 (×2): qty 0.6
  Filled 2014-07-05: qty 1
  Filled 2014-07-05 (×4): qty 0.6

## 2014-07-05 NOTE — Progress Notes (Addendum)
[place] TEAM 1 - Stepdown/ICU TEAM Progress Note  Peter Weiss ZOX:[id number] DOB: 28-Jan-1941 DOA: 07/04/2014 PCP: Carollee Leitz, NP  Admit HPI / Brief Narrative: Peter Weiss is a 74 y.o. male, who is an unassigned patient, with a PMHx of Bovine AVR placed 04/2014 by Dr. Dorris Fetch, DM, HTN, HLD. Presentsed to the ER in hemorrhagic shock. [name] reports he was unable to walk last night so he called EMS. He reports black Loose stools for the last two weeks. He denies ever having black stools before. No GERD, no abdominal pain, no vomiting. He has never seen a gastroenterologist. He also describes chest pain that has been constant since his AVR surgery. It is a pressure sensation over his left breast that is non radiating. He does not indicate that it has become worse recently. [name] describes very poor living conditions - no electricity, no hot water, no transportation. He reports that he has been out of all of his medications for two weeks (inlcluding eliquis) but has continued to take his aspirin. His UDS is positive for opiates and benzodiazepines. He also reports he had an appointment today to see his cardiologist, but felt very weak and dizzy when he tried to stand up , associated with some chest pain.  In the ER the patients vitals were as low as BP 68/48 with pulse rates in the 120s- 130s. Hgb was initially 8.1 but dropped to 7.0 with hydration. Lactic acid was 2.5. Guiac was positive with black melena. CTA Chest was negative for PE but showed an aortic aneurysm of 4.3 cm. CT abdomen/pelvis showed nothing acute. CXR was negative for edema or infiltrate. [name] BP initially responded to IVF, but subsequently dropped again. PCCM and GI (Dr. Elnoria Howard) were consulted.  HPI/Subjective: 4/22 A/O 4 patient extremely agitated and abusive toward staff (RN Rinaldo Cloud, charge nurse as well as RN Wynona Canes both have complained about patient to the point where security was  called). Initially upon entering room patient was belligerent with me complaining about having have his blood pressure taken, labs drawn, and being on a liquid diet per GIs instructions. Initially patient threatened to leave AMA but decided against it.   Assessment/Plan: Hemorrhagic Shock -Resolved. At this point patient's hemoglobin stable however still continues to have melodic stools.  -Will check hemoglobin q 6 hours -Continue clear liquid diet and tail cleared by GI to advanced. -Would not restart patient's Eliquis or aspirin at this time. Would hold on restarting a minimum of 2 weeks, and may not require restart at all.  Upper GI Bleed (hgb 7.0) with acute blood loss anemia. -See hemorrhagic shock -Please transfuse to keep hemoglobin> 7.  Chest pain -Resolved  -Last Cardiac Cath was 03/2014. Mild LAD disease 30%. EKG shows tachycardia, no ischemic changes .    Recent AVR. -Bovine Tissue Valve. -Dr. Dorris Fetch made aware that the patient has been admitted. -CTS and GI will need to determine anticoag/antiplatelet therapy going further.  Aortic Aneurysm -4.3 centimeters. Outpatient follow up annually.   HTN. -BP has been climbing, restart metoprolol 12.5 mg BID  Adjustment disorder. -Haldol 3 mg TID . -Restart Effexor 150 mg daily on 4/22  -ReStart  Zoloft 50 mg daily . On 4/22  Chronic Pain syndrome -Per patient secondary to his multiple herniated discs, and knee pain.  -Restart Neurontin 400 mg TID on 4/22 -OxyContin 15 mg BID -Morphine 2 mg QID PRN breakthrough pain  HLD -Restart Zocor 20 mg daily  -Lipid panel pending  Urinary frequency -Please follow U/A. -Restart Flomax 0.4 mg daily  Social -Patient is very poorly groomed and describes deplorable living conditions. -Social work consulted.   Code Status: FULL Family Communication: no family present at time of exam Disposition Plan: Per GI    Consultants: Dr.Patrick Hung  (GI)    Procedure/Significant Events: 4/21 EGD;- Duodenal ulcers - nonbleeding.-Gastritis with nonbleeding erosions. 4/21 PCXR;Chronic cardiomegaly. The patient is status post aortic valve replacement and left atrial appendage clip. 4/21 CT abdomen pelvis with contrast;-No acute chest findings are evident. No PE. -Ascending aortic aneurysm 4.3 cm diameter, roughly stable from 2012. 4/21 CT angiogram PE protocol; see CT abdomen pelvis with contrast results   Culture NA  Antibiotics: NA  DVT prophylaxis: SCD   Devices NA   LINES / TUBES:  NA    Continuous Infusions: . pantoprozole (PROTONIX) infusion Stopped (07/04/14 1400)    Objective: VITAL SIGNS: Temp: 97.5 F (36.4 C) (04/22 0800) Temp Source: Oral (04/22 0800) BP: 141/78 mmHg (04/22 1500) Pulse Rate: 112 (04/22 1500) SPO2; FIO2:   Intake/Output Summary (Last 24 hours) at 07/05/14 1706 Last data filed at 07/05/14 1500  Gross per 24 hour  Intake   1765 ml  Output   1900 ml  Net   -135 ml     Exam: General: A/O 4, extremely belligerent and confrontational, No acute respiratory distress Lungs: Clear to auscultation bilaterally without wheezes or crackles Cardiovascular: Regular rate and rhythm without murmur gallop or rub normal S1 and S2 Abdomen: Epigastric tenderness, nondistended, soft, bowel sounds positive, no rebound, no ascites, no appreciable mass Extremities: No significant cyanosis, clubbing, or edema bilateral lower extremities  Data Reviewed: Basic Metabolic Panel:  Recent Labs Lab 07/04/14 0405 07/04/14 0429 07/04/14 0850 07/05/14 0225  NA 142 141 142 139  K 3.8 3.8 3.8 3.4*  CL 110 109 112 110  CO2 20  --  22 21  GLUCOSE 207* 197* 156* 132*  BUN 66* 68* 63* 32*  CREATININE 1.00 0.90 0.85 0.89  CALCIUM 9.1  --  8.6 8.3*  MG  --   --   --  1.9  PHOS  --   --   --  3.0   Liver Function Tests:  Recent Labs Lab 07/04/14 0405  AST 14  ALT 12  ALKPHOS 43  BILITOT 0.5   PROT 5.7*  ALBUMIN 3.1*   No results for input(s): LIPASE, AMYLASE in the last 168 hours. No results for input(s): AMMONIA in the last 168 hours. CBC:  Recent Labs Lab 07/04/14 0405  07/04/14 1842 07/05/14 0225 07/05/14 0818 07/05/14 1054 07/05/14 1451  WBC 9.1  --   --  9.2  --   --   --   NEUTROABS  --   --   --  6.9  --   --   --   HGB 8.1*  < > 6.8* 7.4* 7.4* 7.2* 7.6*  HCT 25.6*  < > 21.4* 23.0* 23.3* 22.5* 23.6*  MCV 88.3  --   --  88.8  --   --   --   PLT 211  --   --  121*  --   --   --   < > = values in this interval not displayed. Cardiac Enzymes:  Recent Labs Lab 07/04/14 0850  TROPONINI 0.05*   BNP (last 3 results)  Recent Labs  04/18/14 1404 07/04/14 0425  BNP 235.4* 67.0    ProBNP (last 3 results) No results for input(s): PROBNP in  the last 8760 hours.  CBG:  Recent Labs Lab 07/04/14 1746 07/04/14 2046 07/05/14 0037 07/05/14 0356 07/05/14 0814  GLUCAP 120* 133* 109* 131* 152*    Recent Results (from the past 240 hour(s))  Urine culture     Status: None   Collection Time: 07/04/14 10:03 AM  Result Value Ref Range Status   Specimen Description URINE, CLEAN CATCH  Final   Special Requests NONE  Final   Colony Count NO GROWTH Performed at [facility]   Final   Culture NO GROWTH Performed at [facility]   Final   Report Status 07/05/2014 FINAL  Final  MRSA PCR Screening     Status: None   Collection Time: 07/04/14  1:00 PM  Result Value Ref Range Status   MRSA by PCR NEGATIVE NEGATIVE Final    Comment:        The GeneXpert MRSA Assay (FDA approved for NASAL specimens only), is one component of a comprehensive MRSA colonization surveillance program. It is not intended to diagnose MRSA infection nor to guide or monitor treatment for MRSA infections.   Clostridium Difficile by PCR     Status: None   Collection Time: 07/04/14  9:18 PM  Result Value Ref Range Status   C difficile by pcr NEGATIVE NEGATIVE Final      Studies:  Recent x-ray studies have been reviewed in detail by the Attending Physician  Scheduled Meds:  Scheduled Meds: . sodium chloride   Intravenous Once  . bisacodyl  5 mg Oral BID  . haloperidol lactate  3 mg Intravenous TID  . insulin aspart  0-9 Units Subcutaneous 6 times per day  . OxyCODONE  15 mg Oral Q12H  . simvastatin  50 mg Oral q1800  . sodium chloride  3 mL Intravenous Q12H  . tamsulosin  0.4 mg Oral QPC supper    Time spent on care of this patient: 40 mins   Jalilah Wiltsie, Roselind MessierURTIS J , MD  [facility] Office  [phone] Pager - [phone]  On-Call/Text Page:      [email or web address]      password [email or web address]  If 7PM-7AM, please contact night-coverage [email or web address] Password [email or web address] 07/05/2014, 5:06 PM   LOS: 1 day   Care during the described time interval was provided by me .  I have reviewed this patient's available data, including medical history, events of note, physical examination, radiology studies and test results as part of my evaluation  Carolyne Littlesurtis Mihir Flanigan, MD [phone] Pager

## 2014-07-05 NOTE — Progress Notes (Signed)
Subjective: No acute events.  Objective: Vital signs in last 24 hours: Temp:  [97.7 F (36.5 C)-98.8 F (37.1 C)] 98.6 F (37 C) (04/22 0400) Pulse Rate:  [68-123] 113 (04/22 0400) Resp:  [8-31] 18 (04/22 0400) BP: (99-146)/(47-87) 137/83 mmHg (04/22 0400) SpO2:  [97 %-100 %] 97 % (04/22 0400) Weight:  [108 kg (238 lb 1.6 oz)] 108 kg (238 lb 1.6 oz) (04/21 1315) Last BM Date: 07/04/14  Intake/Output from previous day: 04/21 0701 - 04/22 0700 In: 2199 [P.O.:1030; I.V.:550; Blood:619] Out: 1475 [Urine:1475] Intake/Output this shift:    General appearance: alert and no distress  Lab Results:  Recent Labs  07/04/14 0405  07/04/14 1430 07/04/14 1842 07/05/14 0225  WBC 9.1  --   --   --  9.2  HGB 8.1*  < > 7.5* 6.8* 7.4*  HCT 25.6*  < > 23.3* 21.4* 23.0*  PLT 211  --   --   --  121*  < > = values in this interval not displayed. BMET  Recent Labs  07/04/14 0405 07/04/14 0429 07/04/14 0850 07/05/14 0225  NA 142 141 142 139  K 3.8 3.8 3.8 3.4*  CL 110 109 112 110  CO2 20  --  22 21  GLUCOSE 207* 197* 156* 132*  BUN 66* 68* 63* 32*  CREATININE 1.00 0.90 0.85 0.89  CALCIUM 9.1  --  8.6 8.3*   LFT  Recent Labs  07/04/14 0405  PROT 5.7*  ALBUMIN 3.1*  AST 14  ALT 12  ALKPHOS 43  BILITOT 0.5   PT/INR  Recent Labs  07/04/14 0850  LABPROT 15.1  INR 1.18   Hepatitis Panel No results for input(s): HEPBSAG, HCVAB, HEPAIGM, HEPBIGM in the last 72 hours. C-Diff No results for input(s): CDIFFTOX in the last 72 hours. Fecal Lactopherrin No results for input(s): FECLLACTOFRN in the last 72 hours.  Studies/Results: Ct Angio Chest Pe W/cm &/or Wo Cm  07/04/2014   CLINICAL DATA:  Midline chest pain with midline abdominal discomfort. History of aortic valve replacement. Symptoms began earlier today.  EXAM: CT ANGIOGRAPHY CHEST  CT ABDOMEN AND PELVIS WITH CONTRAST  TECHNIQUE: Multidetector CT imaging of the chest was performed using the standard protocol during  bolus administration of intravenous contrast. Multiplanar CT image reconstructions and MIPs were obtained to evaluate the vascular anatomy. Multidetector CT imaging of the abdomen and pelvis was performed using the standard protocol during bolus administration of intravenous contrast.  CONTRAST:  OMNIPAQUE IOHEXOL 350 MG/ML SOLN  COMPARISON:  CT chest from [facility]   12/07/2010.  FINDINGS: CTA CHEST FINDINGS  Respiratory degradation. Small pulmonary nodules could be overlooked.  Mediastinum: No filling defects are noted within the pulmonary arterial tree to suggest pulmonary emboli. Heart size is increased. Advanced coronary artery calcification. Prior median sternotomy for AVR. Calcified mitral annulus. No pericardial fluid, thickening or calcification. No acute abnormality of the thoracic aorta or other great vessels of the mediastinum. No pathologically enlarged mediastinal or hilar lymph nodes. The esophagus is normal in appearance. Dilated ascending aorta measuring 4.3 cm transverse dimension is seen on coronal image 31. Recommend annual imaging followup by CTA or MRA. This recommendation follows 2010 ACCF/AHA/AATS/ACR/ASA/SCA/SCAI/SIR/STS/SVM Guidelines for the Diagnosis and Management of Patients with Thoracic Aortic Disease. Circulation. 2010; 121: Z610-R604  Lungs/Pleura: No consolidative airspace disease. No pleural effusions. No suspicious appearing pulmonary nodules or masses.  Musculoskeletal: No aggressive appearing lytic or blastic lesions are noted in the visualized portions of the skeleton.  CT ABDOMEN and PELVIS FINDINGS  BODY WALL: Unremarkable.  ABDOMEN/PELVIS:  Liver: No focal abnormality.  Small incidental granuloma LEFT lobe.  Biliary: No evidence of biliary obstruction or stone.  Pancreas: Unremarkable.  Spleen: Unremarkable.  Adrenals: Unremarkable.  Kidneys and ureters: No hydronephrosis or stone.  Bladder: Unremarkable.  Reproductive: Unremarkable.  Bowel: No obstruction.  Normal appendix.  Retroperitoneum: No mass or adenopathy.  Peritoneum: No free fluid or gas.  Vascular: No acute abnormality.  OSSEOUS: No acute abnormalities.  Spondylosis.  Review of the MIP images confirms the above findings.  IMPRESSION: No acute chest findings are evident. No evidence for pulmonary emboli.  Respiratory motion degrades examination for small pulmonary nodules.  Ascending aortic aneurysm 4.3 cm diameter, roughly stable from 2012.  No acute intra-abdominal findings.   Electronically Signed   By: Davonna BellingJohn  Curnes M.D.   On: 07/04/2014 07:23   Ct Abdomen Pelvis W Contrast  07/04/2014   CLINICAL DATA:  Midline chest pain with midline abdominal discomfort. History of aortic valve replacement. Symptoms began earlier today.  EXAM: CT ANGIOGRAPHY CHEST  CT ABDOMEN AND PELVIS WITH CONTRAST  TECHNIQUE: Multidetector CT imaging of the chest was performed using the standard protocol during bolus administration of intravenous contrast. Multiplanar CT image reconstructions and MIPs were obtained to evaluate the vascular anatomy. Multidetector CT imaging of the abdomen and pelvis was performed using the standard protocol during bolus administration of intravenous contrast.  CONTRAST:  100mL OMNIPAQUE IOHEXOL 350 MG/ML SOLN  COMPARISON:  [facility]   12/07/2010.  FINDINGS: CTA CHEST FINDINGS  Respiratory degradation. Small pulmonary nodules could be overlooked.  Mediastinum: No filling defects are noted within the pulmonary arterial tree to suggest pulmonary emboli. Heart size is increased. Advanced coronary artery calcification. Prior median sternotomy for AVR. Calcified mitral annulus. No pericardial fluid, thickening or calcification. No acute abnormality of the thoracic aorta or other great vessels of the mediastinum. No pathologically enlarged mediastinal or hilar lymph nodes. The esophagus is normal in appearance. Dilated ascending aorta measuring 4.3 cm transverse dimension is seen on  coronal image 31. Recommend annual imaging followup by CTA or MRA. This recommendation follows 2010 ACCF/AHA/AATS/ACR/ASA/SCA/SCAI/SIR/STS/SVM Guidelines for the Diagnosis and Management of Patients with Thoracic Aortic Disease. Circulation. 2010; 121: L244-W102: [id number]  Lungs/Pleura: No consolidative airspace disease. No pleural effusions. No suspicious appearing pulmonary nodules or masses.  Musculoskeletal: No aggressive appearing lytic or blastic lesions are noted in the visualized portions of the skeleton.  CT ABDOMEN and PELVIS FINDINGS  BODY WALL: Unremarkable.  ABDOMEN/PELVIS:  Liver: No focal abnormality.  Small incidental granuloma LEFT lobe.  Biliary: No evidence of biliary obstruction or stone.  Pancreas: Unremarkable.  Spleen: Unremarkable.  Adrenals: Unremarkable.  Kidneys and ureters: No hydronephrosis or stone.  Bladder: Unremarkable.  Reproductive: Unremarkable.  Bowel: No obstruction. Normal appendix.  Retroperitoneum: No mass or adenopathy.  Peritoneum: No free fluid or gas.  Vascular: No acute abnormality.  OSSEOUS: No acute abnormalities.  Spondylosis.  Review of the MIP images confirms the above findings.  IMPRESSION: No acute chest findings are evident. No evidence for pulmonary emboli.  Respiratory motion degrades examination for small pulmonary nodules.  Ascending aortic aneurysm 4.3 cm diameter, roughly stable from 2012.  No acute intra-abdominal findings.   Electronically Signed   By: Davonna BellingJohn  Curnes M.D.   On: 07/04/2014 07:23   Dg Chest Port 1 View  07/04/2014   CLINICAL DATA:  Chest pain and shortness of breath  EXAM: PORTABLE CHEST - 1  VIEW  COMPARISON:  05/28/2014  FINDINGS: Chronic cardiomegaly. The patient is status post aortic valve replacement and left atrial appendage clip. Hypoventilation. There is no edema, consolidation, effusion, or pneumothorax. No osseous findings to explain chest pain.  IMPRESSION: No active disease.   Electronically Signed   By: Marnee Spring M.D.   On:  07/04/2014 04:23    Medications:  Scheduled: . sodium chloride   Intravenous Once  . insulin aspart  0-9 Units Subcutaneous 6 times per day  . sodium chloride  3 mL Intravenous Q12H   Continuous: . pantoprozole (PROTONIX) infusion Stopped (07/04/14 1400)    Assessment/Plan: 1) Nonbleeding duodenal ulcers. 2) Gastritis with nonbleeding erosions.   He is clinically stable.  HGB has increased with blood transfusions.  Plan: 1) PPI indefinitely. 2) Follow up gastric biopsies.   LOS: 1 day   Aunya Lemler D 07/05/2014, 7:10 AM

## 2014-07-05 NOTE — Progress Notes (Addendum)
Pt angry, cussing, refusing treatment (blood sugar check/vital signs); states he wants to talk "to my doctor in charge"; Dr Joseph ArtWoods aware; security paged due to pt's significant hostility toward staff and anger; will continue to monitor

## 2014-07-05 NOTE — Progress Notes (Addendum)
CRITICAL VALUE ALERT  Critical value received:  Hemoglobin 6.9 Date of notification:  07-05-14  Time of notification:  2157  Critical value read back:Yes.    Nurse who received alert:  Elwin SleightHayes, Rn  MD notified (1st page):  [facility] [place] NP  Time of first page:  2200  MD notified (2nd page):  Time of second page:  Responding MD:  Claiborne Billingsallahan NP  Time MD responded:  2202

## 2014-07-05 NOTE — Progress Notes (Signed)
Pt. States he can place cpap on himself.  

## 2014-07-06 DIAGNOSIS — D62 Acute posthemorrhagic anemia: Secondary | ICD-10-CM | POA: Diagnosis present

## 2014-07-06 DIAGNOSIS — I714 Abdominal aortic aneurysm, without rupture, unspecified: Secondary | ICD-10-CM | POA: Diagnosis present

## 2014-07-06 DIAGNOSIS — F4324 Adjustment disorder with disturbance of conduct: Secondary | ICD-10-CM | POA: Diagnosis present

## 2014-07-06 DIAGNOSIS — R0789 Other chest pain: Secondary | ICD-10-CM | POA: Diagnosis present

## 2014-07-06 DIAGNOSIS — K922 Gastrointestinal hemorrhage, unspecified: Secondary | ICD-10-CM | POA: Diagnosis present

## 2014-07-06 DIAGNOSIS — I10 Essential (primary) hypertension: Secondary | ICD-10-CM | POA: Diagnosis present

## 2014-07-06 LAB — CBC WITH DIFFERENTIAL/PLATELET
BASOS ABS: 0 10*3/uL (ref 0.0–0.1)
Basophils Relative: 1 % (ref 0–1)
EOS ABS: 0.4 10*3/uL (ref 0.0–0.7)
EOS PCT: 5 % (ref 0–5)
HEMATOCRIT: 23.3 % — AB (ref 39.0–52.0)
Hemoglobin: 7.6 g/dL — ABNORMAL LOW (ref 13.0–17.0)
Lymphocytes Relative: 22 % (ref 12–46)
Lymphs Abs: 1.4 10*3/uL (ref 0.7–4.0)
MCH: 29.1 pg (ref 26.0–34.0)
MCHC: 32.6 g/dL (ref 30.0–36.0)
MCV: 89.3 fL (ref 78.0–100.0)
Monocytes Absolute: 0.5 10*3/uL (ref 0.1–1.0)
Monocytes Relative: 7 % (ref 3–12)
NEUTROS ABS: 4.3 10*3/uL (ref 1.7–7.7)
Neutrophils Relative %: 65 % (ref 43–77)
Platelets: 108 10*3/uL — ABNORMAL LOW (ref 150–400)
RBC: 2.61 MIL/uL — ABNORMAL LOW (ref 4.22–5.81)
RDW: 15.9 % — ABNORMAL HIGH (ref 11.5–15.5)
WBC: 6.6 10*3/uL (ref 4.0–10.5)

## 2014-07-06 LAB — LIPID PANEL
Cholesterol: 101 mg/dL (ref 0–200)
HDL: 28 mg/dL — ABNORMAL LOW (ref >39)
LDL Cholesterol: 62 mg/dL (ref 0–99)
Total CHOL/HDL Ratio: 3.6 RATIO
Triglycerides: 54 mg/dL (ref <150)
VLDL: 11 mg/dL (ref 0–40)

## 2014-07-06 LAB — BASIC METABOLIC PANEL
Anion gap: 7 (ref 5–15)
BUN: 14 mg/dL (ref 6–23)
CO2: 24 mmol/L (ref 19–32)
CREATININE: 0.89 mg/dL (ref 0.50–1.35)
Calcium: 8.5 mg/dL (ref 8.4–10.5)
Chloride: 112 mmol/L (ref 96–112)
GFR calc Af Amer: >90 mL/min (ref >90)
GFR, EST NON AFRICAN AMERICAN: 83 mL/min — AB (ref >90)
GLUCOSE: 112 mg/dL — AB (ref 70–99)
Potassium: 3.4 mmol/L — ABNORMAL LOW (ref 3.5–5.1)
SODIUM: 143 mmol/L (ref 135–145)

## 2014-07-06 LAB — GLUCOSE, CAPILLARY
GLUCOSE-CAPILLARY: 103 mg/dL — AB (ref 70–99)
Glucose-Capillary: 115 mg/dL — ABNORMAL HIGH (ref 70–99)
Glucose-Capillary: 129 mg/dL — ABNORMAL HIGH (ref 70–99)
Glucose-Capillary: 113 mg/dL — ABNORMAL HIGH (ref 70–99)
Glucose-Capillary: 112 mg/dL — ABNORMAL HIGH (ref 70–99)
Glucose-Capillary: 212 mg/dL — ABNORMAL HIGH (ref 70–99)

## 2014-07-06 LAB — HEMOGLOBIN AND HEMATOCRIT, BLOOD
HCT: 23.8 % — ABNORMAL LOW (ref 39.0–52.0)
HEMATOCRIT: 24.6 % — AB (ref 39.0–52.0)
HEMATOCRIT: 23.8 % — AB (ref 39.0–52.0)
HEMOGLOBIN: 7.6 g/dL — AB (ref 13.0–17.0)
HEMOGLOBIN: 7.5 g/dL — AB (ref 13.0–17.0)
Hemoglobin: 7.8 g/dL — ABNORMAL LOW (ref 13.0–17.0)

## 2014-07-06 LAB — PHOSPHORUS: Phosphorus: 3.7 mg/dL (ref 2.3–4.6)

## 2014-07-06 LAB — MAGNESIUM: Magnesium: 1.8 mg/dL (ref 1.5–2.5)

## 2014-07-06 MED ORDER — SODIUM CHLORIDE 0.9 % IV SOLN
8.0000 mg/h | INTRAVENOUS | Status: AC
  Administered 2014-07-06: 8 mg/h via INTRAVENOUS
  Filled 2014-07-06 (×3): qty 80

## 2014-07-06 NOTE — Progress Notes (Signed)
Placed pt. On cpap. Pt. Tolerating well at this time. 

## 2014-07-06 NOTE — Progress Notes (Signed)
[place] TEAM 1 - Stepdown/ICU TEAM Progress Note  Peter Weiss MVH:[id number] DOB: Feb 09, 1941 DOA: 07/04/2014 PCP: Carollee Leitz, NP  Admit HPI / Brief Narrative: Peter Weiss is a 74 y.o. male, who is an unassigned patient, with a PMHx of Bovine AVR placed 04/2014 by Dr. Dorris Fetch, DM, HTN, HLD. Presentsed to the ER in hemorrhagic shock. Peter Weiss reports he was unable to walk last night so he called EMS. He reports black Loose stools for the last two weeks. He denies ever having black stools before. No GERD, no abdominal pain, no vomiting. He has never seen a gastroenterologist. He also describes chest pain that has been constant since his AVR surgery. It is a pressure sensation over his left breast that is non radiating. He does not indicate that it has become worse recently. Peter Weiss describes very poor living conditions - no electricity, no hot water, no transportation. He reports that he has been out of all of his medications for two weeks (inlcluding eliquis) but has continued to take his aspirin. His UDS is positive for opiates and benzodiazepines. He also reports he had an appointment today to see his cardiologist, but felt very weak and dizzy when he tried to stand up , associated with some chest pain.  In the ER the patients vitals were as low as BP 68/48 with pulse rates in the 120s- 130s. Hgb was initially 8.1 but dropped to 7.0 with hydration. Lactic acid was 2.5. Guiac was positive with black melena. CTA Chest was negative for PE but showed an aortic aneurysm of 4.3 cm. CT abdomen/pelvis showed nothing acute. CXR was negative for edema or infiltrate. Peter Weiss's BP initially responded to IVF, but subsequently dropped again. PCCM and GI (Dr. Elnoria Howard) were consulted.  HPI/Subjective: 4/23 A/O 4, Pt requested that we advance his diet.    Assessment/Plan: Hemorrhagic Shock -Resolved. At this point patient's hemoglobin stable however still continues to have  melodic stools.  -Will check hemoglobin q 6 hours -Continue clear liquid diet and tail cleared by GI to advanced. -Would not restart patient's Eliquis or aspirin at this time. Would hold on restarting a minimum of 2 weeks, and may not require restart at all.  Upper GI Bleed (hgb 7.0) with acute blood loss anemia. -See hemorrhagic shock -4/22 Transfused 1 unit PRBC -Continue Protonix gtt 2dary to continued GI bleed  -Please transfuse to keep hemoglobin> 7.  Chest pain -Resolved  -Last Cardiac Cath was 03/2014. Mild LAD disease 30%. EKG shows tachycardia, no ischemic changes .   Recent AVR. -Bovine Tissue Valve. -Dr. Dorris Fetch made aware that the patient has been admitted. -CTS and GI will need to determine anticoag/antiplatelet therapy going further.  Aortic Aneurysm -4.3 centimeters. Outpatient follow up annually.   HTN. -BP has been climbing, restart metoprolol 12.5 mg BID  Adjustment disorder with disturbance of conduct. -Haldol 3 mg TID . -Restart Effexor 150 mg daily on 4/22  -ReStart  Zoloft 50 mg daily . On 4/22  Chronic Pain syndrome -Per patient secondary to his multiple herniated discs, and Weiss pain.  -Continue Neurontin 400 mg TID on 4/22 -OxyContin 15 mg BID -Morphine 2 mg QID PRN breakthrough pain  HLD -Restart Zocor 20 mg daily  -Lipid panel pending  Urinary frequency -Please follow U/A. -Restart Flomax 0.4 mg daily  Social -Patient is very poorly groomed and describes deplorable living conditions. -Social work consulted.   Code Status: FULL Family Communication: no family present at time of exam Disposition Plan:  Per GI    Consultants: Dr.Patrick Elnoria HowardHung (GI)    Procedure/Significant Events: 4/21 EGD;- Duodenal ulcers - nonbleeding.-Gastritis with nonbleeding erosions. 4/21 PCXR;Chronic cardiomegaly. The patient is status post aortic valve replacement and left atrial appendage clip. 4/21 CT abdomen pelvis with contrast;-No acute chest  findings are evident. No PE. -Ascending aortic aneurysm 4.3 cm diameter, roughly stable from 2012. 4/21 CT angiogram PE protocol; see CT abdomen pelvis with contrast results 4/21 transfuse 2 units PRBC 4/22 Transfused 1 unit PRBC  Culture NA  Antibiotics: NA  DVT prophylaxis: SCD   Devices NA   LINES / TUBES:  NA    Continuous Infusions: . pantoprozole (PROTONIX) infusion 8 mg/hr (07/06/14 1900)    Objective: VITAL SIGNS: Temp: 98.6 F (37 C) (04/23 1600) Temp Source: Oral (04/23 1600) BP: 119/88 mmHg (04/23 1640) SPO2; FIO2:   Intake/Output Summary (Last 24 hours) at 07/06/14 1957 Last data filed at 07/06/14 1900  Gross per 24 hour  Intake 1941.17 ml  Output   1925 ml  Net  16.17 ml     Exam: General: A/O 4, NAD, No acute respiratory distress Lungs: Clear to auscultation bilaterally without wheezes or crackles Cardiovascular: Regular rate and rhythm without murmur gallop or rub normal S1 and S2 Abdomen: Epigastric tenderness, nondistended, soft, bowel sounds positive, no rebound, no ascites, no appreciable mass Extremities: No significant cyanosis, clubbing, or edema bilateral lower extremities  Data Reviewed: Basic Metabolic Panel:  Recent Labs Lab 07/04/14 0405 07/04/14 0429 07/04/14 0850 07/05/14 0225 07/06/14 0311  NA 142 141 142 139 143  K 3.8 3.8 3.8 3.4* 3.4*  CL 110 109 112 110 112  CO2 20  --  22 21 24   GLUCOSE 207* 197* 156* 132* 112*  BUN 66* 68* 63* 32* 14  CREATININE 1.00 0.90 0.85 0.89 0.89  CALCIUM 9.1  --  8.6 8.3* 8.5  MG  --   --   --  1.9 1.8  PHOS  --   --   --  3.0 3.7   Liver Function Tests:  Recent Labs Lab 07/04/14 0405  AST 14  ALT 12  ALKPHOS 43  BILITOT 0.5  PROT 5.7*  ALBUMIN 3.1*   No results for input(s): LIPASE, AMYLASE in the last 168 hours. No results for input(s): AMMONIA in the last 168 hours. CBC:  Recent Labs Lab 07/04/14 0405  07/05/14 0225  07/05/14 1451 07/05/14 2110  07/06/14 0311 07/06/14 1045 07/06/14 1514  WBC 9.1  --  9.2  --   --   --  6.6  --   --   NEUTROABS  --   --  6.9  --   --   --  4.3  --   --   HGB 8.1*  < > 7.4*  < > 7.6* 6.9* 7.6* 7.6* 7.8*  HCT 25.6*  < > 23.0*  < > 23.6* 21.8* 23.3* 23.8* 24.6*  MCV 88.3  --  88.8  --   --   --  89.3  --   --   PLT 211  --  121*  --   --   --  108*  --   --   < > = values in this interval not displayed. Cardiac Enzymes:  Recent Labs Lab 07/04/14 0850  TROPONINI 0.05*   BNP (last 3 results)  Recent Labs  04/18/14 1404 07/04/14 0425  BNP 235.4* 67.0    ProBNP (last 3 results) No results for input(s): PROBNP in the last 8760  hours.  CBG:  Recent Labs Lab 07/05/14 2321 07/06/14 0427 07/06/14 0758 07/06/14 1248 07/06/14 1639  GLUCAP 111* 115* 129* 113* 112*    Recent Results (from the past 240 hour(s))  Urine culture     Status: None   Collection Time: 07/04/14 10:03 AM  Result Value Ref Range Status   Specimen Description URINE, CLEAN CATCH  Final   Special Requests NONE  Final   Colony Count NO GROWTH Performed at [facility]   Final   Culture NO GROWTH Performed at [facility]   Final   Report Status 07/05/2014 FINAL  Final  MRSA PCR Screening     Status: None   Collection Time: 07/04/14  1:00 PM  Result Value Ref Range Status   MRSA by PCR NEGATIVE NEGATIVE Final    Comment:        The GeneXpert MRSA Assay (FDA approved for NASAL specimens only), is one component of a comprehensive MRSA colonization surveillance program. It is not intended to diagnose MRSA infection nor to guide or monitor treatment for MRSA infections.   Clostridium Difficile by PCR     Status: None   Collection Time: 07/04/14  9:18 PM  Result Value Ref Range Status   C difficile by pcr NEGATIVE NEGATIVE Final     Studies:  Recent x-ray studies have been reviewed in detail by the Attending Physician  Scheduled Meds:  Scheduled Meds: . sodium chloride    Intravenous Once  . bisacodyl  5 mg Oral BID  . haloperidol lactate  3 mg Intravenous TID  . insulin aspart  0-9 Units Subcutaneous 6 times per day  . OxyCODONE  15 mg Oral Q12H  . simvastatin  20 mg Oral QHS  . sodium chloride  3 mL Intravenous Q12H  . tamsulosin  0.4 mg Oral QPC supper    Time spent on care of this patient: 40 mins   WOODS, Roselind Messier , MD  [facility] Office  [phone] Pager - [phone]  On-Call/Text Page:      [email or web address]      password [email or web address]  If 7PM-7AM, please contact night-coverage [email or web address] Password [facility] 07/06/2014, 7:57 PM   LOS: 2 days   Care during the described time interval was provided by me .  I have reviewed this patient's available data, including medical history, events of note, physical examination, radiology studies and test results as part of my evaluation  Carolyne Littles, MD [phone] Pager

## 2014-07-07 DIAGNOSIS — R079 Chest pain, unspecified: Secondary | ICD-10-CM | POA: Diagnosis present

## 2014-07-07 LAB — MAGNESIUM: Magnesium: 1.8 mg/dL (ref 1.5–2.5)

## 2014-07-07 LAB — CBC WITH DIFFERENTIAL/PLATELET
Basophils Absolute: 0 10*3/uL (ref 0.0–0.1)
Basophils Relative: 0 % (ref 0–1)
EOS ABS: 0.3 10*3/uL (ref 0.0–0.7)
EOS PCT: 6 % — AB (ref 0–5)
HEMATOCRIT: 24.4 % — AB (ref 39.0–52.0)
Hemoglobin: 7.9 g/dL — ABNORMAL LOW (ref 13.0–17.0)
LYMPHS ABS: 1.1 10*3/uL (ref 0.7–4.0)
LYMPHS PCT: 19 % (ref 12–46)
MCH: 29.4 pg (ref 26.0–34.0)
MCHC: 32.4 g/dL (ref 30.0–36.0)
MCV: 90.7 fL (ref 78.0–100.0)
MONOS PCT: 8 % (ref 3–12)
Monocytes Absolute: 0.5 10*3/uL (ref 0.1–1.0)
NEUTROS PCT: 66 % (ref 43–77)
Neutro Abs: 3.8 10*3/uL (ref 1.7–7.7)
PLATELETS: 112 10*3/uL — AB (ref 150–400)
RBC: 2.69 MIL/uL — AB (ref 4.22–5.81)
RDW: 16.6 % — ABNORMAL HIGH (ref 11.5–15.5)
WBC: 5.8 10*3/uL (ref 4.0–10.5)

## 2014-07-07 LAB — GLUCOSE, CAPILLARY
GLUCOSE-CAPILLARY: 106 mg/dL — AB (ref 70–99)
Glucose-Capillary: 104 mg/dL — ABNORMAL HIGH (ref 70–99)
Glucose-Capillary: 192 mg/dL — ABNORMAL HIGH (ref 70–99)
Glucose-Capillary: 103 mg/dL — ABNORMAL HIGH (ref 70–99)
Glucose-Capillary: 138 mg/dL — ABNORMAL HIGH (ref 70–99)

## 2014-07-07 LAB — BASIC METABOLIC PANEL
Anion gap: 5 (ref 5–15)
BUN: 11 mg/dL (ref 6–23)
CHLORIDE: 112 mmol/L (ref 96–112)
CO2: 25 mmol/L (ref 19–32)
CREATININE: 0.9 mg/dL (ref 0.50–1.35)
Calcium: 8.6 mg/dL (ref 8.4–10.5)
GFR calc Af Amer: >90 mL/min (ref >90)
GFR, EST NON AFRICAN AMERICAN: 83 mL/min — AB (ref >90)
Glucose, Bld: 114 mg/dL — ABNORMAL HIGH (ref 70–99)
Potassium: 3.6 mmol/L (ref 3.5–5.1)
SODIUM: 142 mmol/L (ref 135–145)

## 2014-07-07 LAB — PHOSPHORUS: PHOSPHORUS: 4.7 mg/dL — AB (ref 2.3–4.6)

## 2014-07-07 LAB — HEMOGLOBIN AND HEMATOCRIT, BLOOD
HCT: 24 % — ABNORMAL LOW (ref 39.0–52.0)
HEMOGLOBIN: 7.7 g/dL — AB (ref 13.0–17.0)

## 2014-07-07 NOTE — Clinical Social Work Note (Signed)
Clinical Social Work Assessment  Patient Details  Name: Peter Weiss MRN: [id number] Date of Birth: 12-Feb-1941  Date of referral:  07/07/14               Reason for consult:  Housing Concerns/Homelessness, Financial Concerns, Medication Concerns                Permission sought to share information with:  Other (Patient does not want CSW to speak with others about his care.) Permission granted to share information::  No  Name::        Agency::     Relationship::     Contact Information:     Housing/Transportation Living arrangements for the past 2 months:  Mobile Home Source of Information:  Patient Patient Interpreter Needed:  None Criminal Activity/Legal Involvement Pertinent to Current Situation/Hospitalization:  No - Comment as needed Significant Relationships:  None, Other(Comment) (Patient's two children live in [place]. He does not report having any friends or family he can depend on.) Lives with:  Self Do you feel safe going back to the place where you live?  Yes Need for family participation in patient care:  No (Coment)  Care giving concerns:  Patient does live alone and reports that he lives without power, hot water, and in unsanitary conditions. Patient states he is able to complete his ADLs on his own.   Social Worker assessment / plan:  CSW received consult regarding patient's living situation. Patient appears calm and willing to engage with CSW. Per patient he currently lives in a mobile home that he owes "$300 on." He currently lives without power and hot water. The patient explains that prior to living in his current home, he bought a place with a lady that told him he could live there "until he croaked." According to the patient he had was escorted off the property by police and they referred to him as a "squatter." According to the patient, the women "must have thought I had passed away." He now lives by himself in a mobile home, but the power company will not  hook up his electricity until he pays the remaining balance from his previous residence, which is around $900. He shares that he currently receives $1550 in income each month and is able to meet all of needs with this amount, but unfortunately cannot pay the power company off. CSW inquired about the patient not having his medications for 2 weeks. The patient states that this is NOT due to financial concerns, but attributes this to his doctor not ordering the medications. He states that he does not have trouble paying for his medications. CSW inquired about patient's interest in transitioning to a more stable living environment (ALF, SNF etc.), he adamantly refuses this.   Employment status:  Retired Database administrator PT Recommendations:  Home with Home Health (Patient states that he will not go to a facility, even if this is recommended.) Information / Referral to community resources:  Other (Comment Required) (CSW does not feel that APS report is warranted at this time. Patient has capacity, reports that he can complete his own ADLs. )  Patient/Family's Response to care:  Patient plans to return home at discharge.  Patient/Family's Understanding of and Emotional Response to Diagnosis, Current Treatment, and Prognosis:  Patient seems to have good insight into how his medication compliance issues are negatively impacting his health.  Emotional Assessment Appearance:  Appears stated age, Disheveled, Malodorous Attitude/Demeanor/Rapport:  Other (Patient was appropriate) Affect (  typically observed):  Accepting, Appropriate, Calm, Flat Orientation:  Oriented to Self, Oriented to Place, Oriented to  Time, Oriented to Situation Alcohol / Substance use:  Illicit Drugs, Other, Alcohol Use (ETOH = 2, 12oz beers/wk. Marijuana = 2X/mo.) Psych involvement (Current and /or in the community):  No (Comment)  Discharge Needs  Concerns to be addressed:  Basic Needs, Discharge Planning  Concerns, Financial / Insurance Concerns, Medication Concerns, Lack of Support Readmission within the last 30 days:  No Current discharge risk:  Lack of support system, Other (Treatment team has concerns about patients living situation.) Barriers to Discharge:  Other (Patient's acute medical needs are still being resolved)   Roddie McBryant Jasmond River, MSW, Jaconita[place]CSWA, [place] [phone]

## 2014-07-07 NOTE — Progress Notes (Signed)
[facility] unable to take report at this time. Will call again in 15-20 minutes, per unit request

## 2014-07-07 NOTE — Progress Notes (Signed)
Report given to Brittney, RN

## 2014-07-07 NOTE — Progress Notes (Signed)
Set up CPAP and placed patient on for the night. Patient wearing nasal mask in auto settings 20 max/5 min. Pt tolerating well at this time.

## 2014-07-07 NOTE — Progress Notes (Signed)
[place] TEAM 1 - Stepdown/ICU TEAM Progress Note  Peter Weiss ZHY:[id number] DOB: 09-15-1940 DOA: 07/04/2014 PCP: Carollee Leitz, NP  Admit HPI / Brief Narrative: Peter Weiss is a 74 y.o. male, who is an unassigned patient, with a PMHx of Bovine AVR placed 04/2014 by Dr. Dorris Fetch, DM, HTN, HLD. Presentsed to the ER in hemorrhagic shock. Peter Weiss reports he was unable to walk last night so he called EMS. He reports black Loose stools for the last two weeks. He denies ever having black stools before. No GERD, no abdominal pain, no vomiting. He has never seen a gastroenterologist. He also describes chest pain that has been constant since his AVR surgery. It is a pressure sensation over his left breast that is non radiating. He does not indicate that it has become worse recently. Peter Weiss describes very poor living conditions - no electricity, no hot water, no transportation. He reports that he has been out of all of his medications for two weeks (inlcluding eliquis) but has continued to take his aspirin. His UDS is positive for opiates and benzodiazepines. He also reports he had an appointment today to see his cardiologist, but felt very weak and dizzy when he tried to stand up , associated with some chest pain.  In the ER the patients vitals were as low as BP 68/48 with pulse rates in the 120s- 130s. Hgb was initially 8.1 but dropped to 7.0 with hydration. Lactic acid was 2.5. Guiac was positive with black melena. CTA Chest was negative for PE but showed an aortic aneurysm of 4.3 cm. CT abdomen/pelvis showed nothing acute. CXR was negative for edema or infiltrate. Peter Weiss's BP initially responded to IVF, but subsequently dropped again. PCCM and GI (Dr. Elnoria Howard) were consulted.  HPI/Subjective: 4/24 A/O 4, tolerated diet without N/V/melanoma/hemetoemesis.    Assessment/Plan: Hemorrhagic Shock -Resolved. At this point patient's hemoglobin stable however still continues to  have melodic stools.  -Will check hemoglobin q 6 hours -Continue clear liquid diet and tail cleared by GI to advanced. -Would not restart patient's Eliquis or aspirin at this time. Would hold on restarting a minimum of 2 weeks, and may not require restart at all.  Upper GI Bleed (hgb 7.0) with acute blood loss anemia. -See hemorrhagic shock -4/22 Transfused 1 unit PRBC -Continue Protonix gtt 2dary to continued GI bleed  -Please transfuse to keep hemoglobin> 7.  Chest pain -Resolved  -Last Cardiac Cath was 03/2014. Mild LAD disease 30%. EKG shows tachycardia, no ischemic changes .   Recent AVR. -Bovine Tissue Valve. -Dr. Dorris Fetch made aware that the patient has been admitted. -CTS and GI will need to determine anticoag/antiplatelet therapy going forward.  Aortic Aneurysm -4.3 centimeters. Outpatient follow up annually.   HTN. -BP has been climbing, continue metoprolol 12.5 mg BID  Adjustment disorder with disturbance of conduct. -Continue Haldol 3 mg TID . -Continue Effexor 150 mg daily on 4/22  -Continue  Zoloft 50 mg daily . On 4/22  Chronic Pain syndrome -Per patient secondary to his multiple herniated discs, and knee pain.  -Continue Neurontin 400 mg TID on 4/22 -OxyContin 15 mg BID -Morphine 2 mg QID PRN breakthrough pain  Diabetes type 2 controlled -4/21 hemoglobin A1c= 6.7   HLD -Continue  Zocor 20 mg daily  -Lipid panel within ADA guidelines except for low HDL   Urinary frequency -Please follow U/A. -Continue Flomax 0.4 mg daily  Social -Patient is very poorly groomed and describes deplorable living conditions. -Social work consulted.  Code Status: FULL Family Communication: no family present at time of exam Disposition Plan: Per GI    Consultants: Dr.Patrick Hung (GI)    Procedure/Significant Events: 4/21 EGD;- Duodenal ulcers - nonbleeding.-Gastritis with nonbleeding erosions. 4/21 PCXR;Chronic cardiomegaly. The patient is status post  aortic valve replacement and left atrial appendage clip. 4/21 CT abdomen pelvis with contrast;-No acute chest findings are evident. No PE. -Ascending aortic aneurysm 4.3 cm diameter, roughly stable from 2012. 4/21 CT angiogram PE protocol; see CT abdomen pelvis with contrast results 4/21 transfuse 2 units PRBC 4/22 Transfused 1 unit PRBC  Culture NA  Antibiotics: NA  DVT prophylaxis: SCD   Devices NA   LINES / TUBES:  NA    Continuous Infusions:    Objective: VITAL SIGNS: Temp: 98.6 F (37 C) (04/24 1159) Temp Source: Oral (04/24 1159) BP: 89/52 mmHg (04/24 1200) Pulse Rate: 95 (04/24 0800) SPO2; FIO2:   Intake/Output Summary (Last 24 hours) at 07/07/14 1724 Last data filed at 07/07/14 1400  Gross per 24 hour  Intake    695 ml  Output    850 ml  Net   -155 ml     Exam: General: A/O 4, NAD, No acute respiratory distress Lungs: Clear to auscultation bilaterally without wheezes or crackles Cardiovascular: Regular rate and rhythm without murmur gallop or rub normal S1 and S2 Abdomen: Nontender , nondistended, soft, bowel sounds positive, no rebound, no ascites, no appreciable mass Extremities: No significant cyanosis, clubbing, or edema bilateral lower extremities  Data Reviewed: Basic Metabolic Panel:  Recent Labs Lab 07/04/14 0405 07/04/14 0429 07/04/14 0850 07/05/14 0225 07/06/14 0311 07/07/14 0252  NA 142 141 142 139 143 142  K 3.8 3.8 3.8 3.4* 3.4* 3.6  CL 110 109 112 110 112 112  CO2 20  --  GLUCOSE 207* 197* 156* 132* 112* 114*  BUN 66* 68* 63* 32* 14 11  CREATININE 1.00 0.90 0.85 0.89 0.89 0.90  CALCIUM 9.1  --  8.6 8.3* 8.5 8.6  MG  --   --   --  1.9 1.8 1.8  PHOS  --   --   --  3.0 3.7 4.7*   Liver Function Tests:  Recent Labs Lab 07/04/14 0405  AST 14  ALT 12  ALKPHOS 43  BILITOT 0.5  PROT 5.7*  ALBUMIN 3.1*   No results for input(s): LIPASE, AMYLASE in the last 168 hours. No results for input(s): AMMONIA  in the last 168 hours. CBC:  Recent Labs Lab 07/04/14 0405  07/05/14 0225  07/06/14 0311 07/06/14 1045 07/06/14 1514 07/06/14 2101 07/07/14 0252 07/07/14 1200  WBC 9.1  --  9.2  --  6.6  --   --   --  5.8  --   NEUTROABS  --   --  6.9  --  4.3  --   --   --  3.8  --   HGB 8.1*  < > 7.4*  < > 7.6* 7.6* 7.8* 7.5* 7.9* 7.7*  HCT 25.6*  < > 23.0*  < > 23.3* 23.8* 24.6* 23.8* 24.4* 24.0*  MCV 88.3  --  88.8  --  89.3  --   --   --  90.7  --   PLT 211  --  121*  --  108*  --   --   --  112*  --   < > = values in this interval not displayed. Cardiac Enzymes:  Recent Labs Lab 07/04/14 [phone]  TROPONINI 0.05*   BNP (last 3 results)  Recent Labs  04/18/14 1404 07/04/14 0425  BNP 235.4* 67.0    ProBNP (last 3 results) No results for input(s): PROBNP in the last 8760 hours.  CBG:  Recent Labs Lab 07/06/14 2018 07/06/14 2329 07/07/14 0325 07/07/14 0749 07/07/14 1158  GLUCAP 212* 103* 106* 104* 192*    Recent Results (from the past 240 hour(s))  Urine culture     Status: None   Collection Time: 07/04/14 10:03 AM  Result Value Ref Range Status   Specimen Description URINE, CLEAN CATCH  Final   Special Requests NONE  Final   Colony Count NO GROWTH [facility]formed at [facility]   Final   Culture NO GROWTH [facility]formed at [facility]   Final   Report Status 07/05/2014 FINAL  Final  MRSA PCR Screening     Status: None   Collection Time: 07/04/14  1:00 PM  Result Value Ref Range Status   MRSA by PCR NEGATIVE NEGATIVE Final    Comment:        The GeneXpert MRSA Assay (FDA approved for NASAL specimens only), is one component of a comprehensive MRSA colonization surveillance program. It is not intended to diagnose MRSA infection nor to guide or monitor treatment for MRSA infections.   Clostridium Difficile by PCR     Status: None   Collection Time: 07/04/14  9:18 PM  Result Value Ref Range Status   C difficile by pcr NEGATIVE NEGATIVE Final      Studies:  Recent x-ray studies have been reviewed in detail by the Attending Physician  Scheduled Meds:  Scheduled Meds: . sodium chloride   Intravenous Once  . bisacodyl  5 mg Oral BID  . haloperidol lactate  3 mg Intravenous TID  . insulin aspart  0-9 Units Subcutaneous 6 times per day  . OxyCODONE  15 mg Oral Q12H  . simvastatin  20 mg Oral QHS  . sodium chloride  3 mL Intravenous Q12H  . tamsulosin  0.4 mg Oral QPC supper    Time spent on care of this patient: 40 mins   Migdalia Olejniczak, Roselind MessierURTIS J , MD  [facility] Office  [phone] Pager - [phone]  On-Call/Text Page:      [email or web address] [email or web address]      password [email or web address]  If 7PM-7AM, please contact night-coverage [email or web address] Password [email or web address] 07/07/2014, 5:24 PM   LOS: 3 days   Care during the described time interval was provided by me .  I have reviewed this patient's available data, including medical history, events of note, physical examination, radiology studies and test results as part of my evaluation  Carolyne Littlesurtis Lamya Lausch, MD [phone] Pager

## 2014-07-07 NOTE — Evaluation (Signed)
Physical Therapy Evaluation Patient Details Name: Peter Weiss MRN: [id number] DOB: 02-13-1941 Today's Date: 07/07/2014   History of Present Illness  Peter Weiss is a 74 y.o. male with a PMH of Bovine AVR placed 04/2014 by Dr. Dorris Fetch, DM, HTN, HLD, who presents to the ER in hemorrhagic shock with GIB  Clinical Impression  Pt with flat affect concerned regarding a friends staying in his house and partying. Pt reports he recently finished HHPT but would be agreeable to their return. Pt has been walking with a cane and recommended use of Rw at all times due to frequent falls. Pt states he does carry a cell phone with him in the event of a fall. Pt with below deficits (PT problem list) who will benefit from acute therapy to maximize mobility, function and activity tolerance to decrease fall risk and increase independence. Pt reports friends can assist at home, he has no desire for therapy in a facility and would refuse and is agreeable to RW use. Educated for bil LE HEP and need to increase mobility acutely.     Follow Up Recommendations Home health PT    Equipment Recommendations  None recommended by PT    Recommendations for Other Services OT consult     Precautions / Restrictions Precautions Precautions: Fall Precaution Comments: pt reports 10 falls in the last year Restrictions Weight Bearing Restrictions: No      Mobility  Bed Mobility Overal bed mobility: Modified Independent                Transfers Overall transfer level: Needs assistance   Transfers: Sit to/from Stand Sit to Stand: Supervision         General transfer comment: cues for safety and hand placement  Ambulation/Gait Ambulation/Gait assistance: Supervision Ambulation Distance (Feet): 150 Feet Assistive device: Rolling walker (2 wheeled) Gait Pattern/deviations: Step-through pattern;Decreased stride length   Gait velocity interpretation: Below normal speed for age/gender General  Gait Details: cues for position in RW  Stairs            Wheelchair Mobility    Modified Rankin (Stroke Patients Only)       Balance Overall balance assessment: History of Falls                                           Pertinent Vitals/Pain Pain Assessment: No/denies pain  HR 110-124    Home Living Family/patient expects to be discharged to:: Private residence Living Arrangements: Non-relatives/Friends Available Help at Discharge: Friend(s);Available PRN/intermittently Type of Home: Mobile home Home Access: Stairs to enter Entrance Stairs-Rails: Can reach both;Right;Left Entrance Stairs-Number of Steps: 4 Home Layout: One level Home Equipment: Walker - 2 wheels;Cane - single point      Prior Function Level of Independence: Independent with assistive device(s)         Comments: pt states he walks with 2 canes normally     Hand Dominance        Extremity/Trunk Assessment   Upper Extremity Assessment: Overall WFL for tasks assessed           Lower Extremity Assessment: RLE deficits/detail;LLE deficits/detail RLE Deficits / Details: hip flexion and knee flexion 5/5, knee extension 3/5 LLE Deficits / Details: hip flexion and knee flexion 5/5, knee extension 3/5  Cervical / Trunk Assessment: Normal  Communication   Communication: No difficulties  Cognition Arousal/Alertness: Awake/alert Behavior During Therapy:  Flat affect Overall Cognitive Status: Within Functional Limits for tasks assessed                      General Comments      Exercises General Exercises - Lower Extremity Long Arc Quad: AROM;Seated;Both;10 reps      Assessment/Plan    PT Assessment Patient needs continued PT services  PT Diagnosis Difficulty walking;Generalized weakness   PT Problem List Decreased strength;Decreased activity tolerance;Decreased balance;Decreased mobility;Decreased knowledge of use of DME  PT Treatment Interventions Gait  training;Stair training;Functional mobility training;Therapeutic activities;Therapeutic exercise;Balance training;DME instruction;Patient/family education   PT Goals (Current goals can be found in the Care Plan section) Acute Rehab PT Goals Patient Stated Goal: return home and care for my dogs PT Goal Formulation: With patient Time For Goal Achievement: 07/21/14 Potential to Achieve Goals: Good    Frequency Min 3X/week   Barriers to discharge Decreased caregiver support      Co-evaluation               End of Session Equipment Utilized During Treatment: Gait belt Activity Tolerance: Patient tolerated treatment well Patient left: in chair;with call bell/phone within reach;with chair alarm set Nurse Communication: Mobility status         Time: [id number] PT Time Calculation (min) (ACUTE ONLY): 19 min   Charges:   PT Evaluation $Initial PT Evaluation Tier I: 1 Procedure     PT G CodesDelorse Lek:        Tabor, Ephrem Carrick Beth 07/07/2014, 10:59 AM Delaney MeigsMaija Tabor Jacobus Colvin, PT [phone]

## 2014-07-08 LAB — TYPE AND SCREEN
ABO/RH(D): O POS
ANTIBODY SCREEN: NEGATIVE
UNIT DIVISION: 0
Unit division: 0
Unit division: 0
Unit division: 0

## 2014-07-08 LAB — BASIC METABOLIC PANEL
Anion gap: 9 (ref 5–15)
BUN: 12 mg/dL (ref 6–23)
CALCIUM: 8.1 mg/dL — AB (ref 8.4–10.5)
CHLORIDE: 106 mmol/L (ref 96–112)
CO2: 23 mmol/L (ref 19–32)
Creatinine, Ser: 0.97 mg/dL (ref 0.50–1.35)
GFR calc Af Amer: >90 mL/min (ref >90)
GFR calc non Af Amer: 81 mL/min — ABNORMAL LOW (ref >90)
Glucose, Bld: 108 mg/dL — ABNORMAL HIGH (ref 70–99)
POTASSIUM: 3.4 mmol/L — AB (ref 3.5–5.1)
SODIUM: 138 mmol/L (ref 135–145)

## 2014-07-08 LAB — GLUCOSE, CAPILLARY
GLUCOSE-CAPILLARY: 105 mg/dL — AB (ref 70–99)
GLUCOSE-CAPILLARY: 115 mg/dL — AB (ref 70–99)
Glucose-Capillary: 124 mg/dL — ABNORMAL HIGH (ref 70–99)
Glucose-Capillary: 127 mg/dL — ABNORMAL HIGH (ref 70–99)
Glucose-Capillary: 136 mg/dL — ABNORMAL HIGH (ref 70–99)
Glucose-Capillary: 190 mg/dL — ABNORMAL HIGH (ref 70–99)

## 2014-07-08 LAB — CBC
HEMATOCRIT: 24.1 % — AB (ref 39.0–52.0)
Hemoglobin: 7.6 g/dL — ABNORMAL LOW (ref 13.0–17.0)
MCH: 28.5 pg (ref 26.0–34.0)
MCHC: 31.5 g/dL (ref 30.0–36.0)
MCV: 90.3 fL (ref 78.0–100.0)
Platelets: 116 10*3/uL — ABNORMAL LOW (ref 150–400)
RBC: 2.67 MIL/uL — ABNORMAL LOW (ref 4.22–5.81)
RDW: 16.5 % — ABNORMAL HIGH (ref 11.5–15.5)
WBC: 6.4 10*3/uL (ref 4.0–10.5)

## 2014-07-08 LAB — CBC WITH DIFFERENTIAL/PLATELET
BASOS PCT: 1 % (ref 0–1)
Basophils Absolute: 0 10*3/uL (ref 0.0–0.1)
EOS ABS: 0.3 10*3/uL (ref 0.0–0.7)
Eosinophils Relative: 5 % (ref 0–5)
HEMATOCRIT: 22.6 % — AB (ref 39.0–52.0)
HEMOGLOBIN: 7.3 g/dL — AB (ref 13.0–17.0)
LYMPHS PCT: 18 % (ref 12–46)
Lymphs Abs: 1.2 10*3/uL (ref 0.7–4.0)
MCH: 28.7 pg (ref 26.0–34.0)
MCHC: 32.3 g/dL (ref 30.0–36.0)
MCV: 89 fL (ref 78.0–100.0)
MONOS PCT: 8 % (ref 3–12)
Monocytes Absolute: 0.6 10*3/uL (ref 0.1–1.0)
Neutro Abs: 4.4 10*3/uL (ref 1.7–7.7)
Neutrophils Relative %: 68 % (ref 43–77)
Platelets: 122 10*3/uL — ABNORMAL LOW (ref 150–400)
RBC: 2.54 MIL/uL — ABNORMAL LOW (ref 4.22–5.81)
RDW: 16.5 % — ABNORMAL HIGH (ref 11.5–15.5)
WBC: 6.5 10*3/uL (ref 4.0–10.5)

## 2014-07-08 LAB — PHOSPHORUS: PHOSPHORUS: 3.8 mg/dL (ref 2.3–4.6)

## 2014-07-08 LAB — HEMOGLOBIN AND HEMATOCRIT, BLOOD
HCT: 23.2 % — ABNORMAL LOW (ref 39.0–52.0)
Hemoglobin: 7.6 g/dL — ABNORMAL LOW (ref 13.0–17.0)

## 2014-07-08 LAB — MAGNESIUM: Magnesium: 1.7 mg/dL (ref 1.5–2.5)

## 2014-07-08 MED ORDER — POTASSIUM CHLORIDE CRYS ER 20 MEQ PO TBCR
40.0000 meq | EXTENDED_RELEASE_TABLET | Freq: Once | ORAL | Status: AC
  Administered 2014-07-08: 40 meq via ORAL
  Filled 2014-07-08: qty 2

## 2014-07-08 MED ORDER — HALOPERIDOL 2 MG PO TABS
3.0000 mg | ORAL_TABLET | Freq: Three times a day (TID) | ORAL | Status: DC
  Administered 2014-07-08: 3 mg via ORAL
  Filled 2014-07-08 (×5): qty 1

## 2014-07-08 MED ORDER — INSULIN ASPART 100 UNIT/ML ~~LOC~~ SOLN
0.0000 [IU] | Freq: Three times a day (TID) | SUBCUTANEOUS | Status: DC
  Administered 2014-07-08: 2 [IU] via SUBCUTANEOUS

## 2014-07-08 MED ORDER — PANTOPRAZOLE SODIUM 40 MG PO TBEC
40.0000 mg | DELAYED_RELEASE_TABLET | Freq: Every day | ORAL | Status: DC
  Administered 2014-07-08: 40 mg via ORAL

## 2014-07-08 MED ORDER — PANTOPRAZOLE SODIUM 40 MG PO TBEC
40.0000 mg | DELAYED_RELEASE_TABLET | Freq: Two times a day (BID) | ORAL | Status: DC
  Administered 2014-07-09: 40 mg via ORAL
  Filled 2014-07-08: qty 1

## 2014-07-08 NOTE — Progress Notes (Signed)
Pt was already on CPAP upon entering the room. Pt tolerating well.

## 2014-07-08 NOTE — Progress Notes (Signed)
[facility] TEAM 1 - Stepdown/ICU TEAM Progress Note  Hiran Leard ZOX:[id number] DOB: [birth year] DOA: 07/04/2014 PCP: Carollee Leitz, NP  Admit HPI / Brief Narrative: 74 y.o. Male with a Hx of Bovine AVR 04/2014 by Dr. Dorris Fetch, DM, HTN, and HLD who presentsed to the ER in hemorrhagic shock. Mr. Romney reported he was unable to walk last so he called EMS. He reports blackloose stools for two weeks. He denies ever having black stools before. No GERD, no abdominal pain, no vomiting. Mr. Gelin described very poor living conditions - no electricity, no hot water, no transportation. He reported he had been out of all of his medications for two weeks (inlcluding eliquis) but had continued to take aspirin. His UDS was positive for opiates and benzodiazepines.   In the ER his BP was 68/48 with pulse rates in the 120s - 130s. Hgb was initially 8.1 but dropped to 7.0 with hydration. Lactic acid was 2.5. Guiac was positive with black melena. CTA Chest was negative for PE but showed an aortic aneurysm of 4.3 cm. CT abdomen/pelvis showed nothing acute. CXR was negative for edema or infiltrate.  HPI/Subjective: The patient states he did note dark stool this morning.  He denies chest pain fevers chills nausea or vomiting.  He is frustrated at the frequency of blood draws that are being obtained.  Assessment/Plan:  Hemorrhagic Shock due to upper GI bleed -resolved with volume resuscitation and transfusion -will not restart patient's Eliquis or aspirin at this time - feel he is a poor candidate to ever resume anticoagulation given compliance issues as well as this life-threatening bleed -nonbleeding duodenal ulcers and nonbleeding gastritis appreciated on EGD -to continue Protonix indefinitely -change to oral today  Acute blood loss anemia -transfused 3 units PRBC -presently hemoglobin continues to trend down therefore patient not yet ready for discharge -Recheck CBC this afternoon and in  the morning  Chest pain -Resolved  -Last Cardiac Cath was 03/2014. Mild LAD disease 30%. EKG shows tachycardia, no ischemic changes.   Recent AVR - Tissue valve  -Bovine Tissue Valve -Dr. Dorris Fetch confirmed pt does not require anticoag for his valve at this time  Aortic Aneurysm -4.3 centimeters - will need outpatient follow up annually  HTN -BP has been climbing, continue metoprolol 12.5 mg BID  Adjustment disorder with disturbance of conduct -Continue Haldol 3 mg TID with transition to oral dosing today -Continue Effexor 150 mg daily on 4/22  -Continue Zoloft 50 mg daily . On 4/22  Chronic Pain syndrome -Per patient secondary to his multiple herniated discs, and knee pain  -Continue Neurontin 400 mg TID  -OxyContin 15 mg BID -Morphine 2 mg QID PRN breakthrough pain  Diabetes type 2 controlled -hemoglobin A1c 6.7 - CBG currently well-controlled  HLD -Continue Zocor 20 mg daily   Urinary frequency -Urinalysis unrevealing -Continue Flomax 0.4 mg daily  Social -Patient is very poorly groomed and describes deplorable living conditions, nonetheless he has refused assistance with changing this situation -Social work consulted  Code Status: FULL Family Communication: no family present at time of exam Disposition Plan: Continue to monitor serial CBC - if hemoglobin continues in downward trend further investigation will be required  Consultants: GI - Dr. Elnoria Howard  Procedures: 4/21 EGD- Duodenal ulcers - nonbleeding -Gastritis with nonbleeding erosions  Antibiotics: None  DVT prophylaxis: SCDs  Objective: Blood pressure 95/52, pulse 92, temperature 98.5 F (36.9 C), temperature source Oral, resp. rate 18, height  (1.803 m), weight 108 kg (238 lb  1.6 oz), SpO2 97 %.  Intake/Output Summary (Last 24 hours) at 07/08/14 0842 Last data filed at 07/08/14 0413  Gross per 24 hour  Intake    880 ml  Output    925 ml  Net    -45 ml   Exam: General: No acute  respiratory distress Lungs: Clear to auscultation bilaterally without wheezes or crackles Cardiovascular: Regular rate and rhythm without murmur gallop or rub  Abdomen: Nontender, nondistended, soft, bowel sounds positive, no rebound, no ascites, no appreciable mass Extremities: No significant cyanosis, clubbing, or edema bilateral lower extremities  Data Reviewed: Basic Metabolic Panel:  Recent Labs Lab 07/04/14 0850 07/05/14 0225 07/06/14 0311 07/07/14 0252 07/08/14 0247  NA 142 139 143 142 138  K 3.8 3.4* 3.4* 3.6 3.4*  CL 112 110 112 112 106  CO2 GLUCOSE 156* 132* 112* 114* 108*  BUN 63* 32* CREATININE 0.85 0.89 0.89 0.90 0.97  CALCIUM 8.6 8.3* 8.5 8.6 8.1*  MG  --  1.9 1.8 1.8 1.7  PHOS  --  3.0 3.7 4.7* 3.8    Liver Function Tests:  Recent Labs Lab 07/04/14 0405  AST 14  ALT 12  ALKPHOS 43  BILITOT 0.5  PROT 5.7*  ALBUMIN 3.1*    Coags:  Recent Labs Lab 07/04/14 0850  INR 1.18    CBC:  Recent Labs Lab 07/04/14 0405  07/05/14 0225  07/06/14 0311  07/06/14 2101 07/07/14 0252 07/07/14 1200 07/08/14 07/08/14 0247  WBC 9.1  --  9.2  --  6.6  --   --  5.8  --   --  6.5  NEUTROABS  --   --  6.9  --  4.3  --   --  3.8  --   --  4.4  HGB 8.1*  < > 7.4*  < > 7.6*  < > 7.5* 7.9* 7.7* 7.6* 7.3*  HCT 25.6*  < > 23.0*  < > 23.3*  < > 23.8* 24.4* 24.0* 23.2* 22.6*  MCV 88.3  --  88.8  --  89.3  --   --  90.7  --   --  89.0  PLT 211  --  121*  --  108*  --   --  112*  --   --  122*  < > = values in this interval not displayed.  Cardiac Enzymes:  Recent Labs Lab 07/04/14 0850  TROPONINI 0.05*    CBG:  Recent Labs Lab 07/07/14 1158 07/07/14 1740 07/07/14 2041 07/08/14 0013 07/08/14 0415  GLUCAP 192* 103* 138* 127* 105*    Recent Results (from the past 240 hour(s))  Urine culture     Status: None   Collection Time: 07/04/14 10:03 AM  Result Value Ref Range Status   Specimen Description URINE, CLEAN CATCH  Final     Special Requests NONE  Final   Colony Count NO GROWTH Performed at [facility]   Final   Culture NO GROWTH Performed at [facility]   Final   Report Status 07/05/2014 FINAL  Final  MRSA PCR Screening     Status: None   Collection Time: 07/04/14  1:00 PM  Result Value Ref Range Status   MRSA by PCR NEGATIVE NEGATIVE Final    Comment:        The GeneXpert MRSA Assay (FDA approved for NASAL specimens only), is one component of a comprehensive MRSA colonization surveillance program. It is not  intended to diagnose MRSA infection nor to guide or monitor treatment for MRSA infections.   Clostridium Difficile by PCR     Status: None   Collection Time: 07/04/14  9:18 PM  Result Value Ref Range Status   C difficile by pcr NEGATIVE NEGATIVE Final     Studies:   Recent x-ray studies have been reviewed in detail by the Attending Physician  Scheduled Meds:  Scheduled Meds: . sodium chloride   Intravenous Once  . bisacodyl  5 mg Oral BID  . haloperidol lactate  3 mg Intravenous TID  . insulin aspart  0-9 Units Subcutaneous 6 times per day  . OxyCODONE  15 mg Oral Q12H  . simvastatin  20 mg Oral QHS  . sodium chloride  3 mL Intravenous Q12H  . tamsulosin  0.4 mg Oral QPC supper    Time spent on care of this patient: 35 mins   Oluwademilade Kellett T , MD   [facility] Office  [phone] Pager - Text Page per Loretha StaplerAmion as per below:  On-Call/Text Page:      [email or web address]      password [email or web address]  If 7PM-7AM, please contact night-coverage [email or web address] Password [email or web address] 07/08/2014, 8:42 AM   LOS: 4 days

## 2014-07-08 NOTE — Progress Notes (Signed)
Medicare Important Message given? YES  (If response is "NO", the following Medicare IM given date fields will be blank)  Date Medicare IM given: 07/08/14 Medicare IM given by:  Lakitha Gordy  

## 2014-07-08 NOTE — Evaluation (Signed)
Occupational Therapy Evaluation Patient Details Name: Peter Weiss MRN: [id number] DOB: [birth year] Today's Date: 07/08/2014    History of Present Illness Peter Weiss is a 74 y.o. male with a PMH of Bovine AVR placed 04/2014 by Dr. Dorris FetchHendrickson, DM, HTN, HLD, who presents to the ER in hemorrhagic shock with GIB   Clinical Impression   Pt is well known to this therapist from prior admission.  Pt with longstanding safety and little support of friends at home.  His memory is very poor with decreased generalization of memory strategies.  Pt is not safe at home and his capability of managing his medical needs is questionable, but he continues to decline any assistance offered with the exception of HHPT which stopped 2 weeks prior to admission. Pt's priority is to return home and care for his dogs. No further acute OT needs.    Follow Up Recommendations  No OT follow up;Supervision - Intermittent (pt needs SNF placement, but continues to refuse)    Equipment Recommendations  None recommended by OT    Recommendations for Other Services       Precautions / Restrictions Precautions Precautions: Fall Precaution Comments: pt reports 10 falls in the last year Restrictions Weight Bearing Restrictions: No      Mobility Bed Mobility Overal bed mobility: Modified Independent                Transfers Overall transfer level: Needs assistance Equipment used: Rolling walker (2 wheeled) Transfers: Sit to/from Stand Sit to Stand: Supervision         General transfer comment: cues for safety and hand placement    Balance                                            ADL                                         General ADL Comments: Pt mildly unsteady with decreased safety awareness requiring supervision for standing, ambulating and transfers with RW.     Vision     Perception     Praxis      Pertinent Vitals/Pain Pain Assessment:  No/denies pain     Hand Dominance Right   Extremity/Trunk Assessment Upper Extremity Assessment Upper Extremity Assessment: Overall WFL for tasks assessed   Lower Extremity Assessment Lower Extremity Assessment: Defer to PT evaluation   Cervical / Trunk Assessment Cervical / Trunk Assessment: Normal   Communication Communication Communication: No difficulties   Cognition Arousal/Alertness: Awake/alert Behavior During Therapy: Flat affect Overall Cognitive Status: History of cognitive impairments - at baseline       Memory: Decreased short-term memory             General Comments       Exercises       Shoulder Instructions      Home Living Family/patient expects to be discharged to:: Private residence Living Arrangements: Non-relatives/Friends Available Help at Discharge: Friend(s);Available PRN/intermittently;Neighbor Type of Home: Mobile home Home Access: Stairs to enter Entergy CorporationEntrance Stairs-Number of Steps: 4 Entrance Stairs-Rails: Can reach both;Right;Left Home Layout: One level     Bathroom Shower/Tub: Producer, television/film/videoWalk-in shower   Bathroom Toilet: Standard     Home Equipment: Environmental consultantWalker - 2 wheels;Cane - single point;Adaptive equipment Adaptive Equipment: Long-handled  shoe horn;Long-handled sponge;Reacher;Other (Comment) (toilet aide)        Prior Functioning/Environment Level of Independence: Independent with assistive device(s)        Comments: pt states he walks with 2 canes normally    OT Diagnosis:     OT Problem List:     OT Treatment/Interventions:      OT Goals(Current goals can be found in the care plan section) Acute Rehab OT Goals Patient Stated Goal: return home and care for my dogs  OT Frequency:     Barriers to D/C:            Co-evaluation              End of Session    Activity Tolerance: Patient tolerated treatment well Patient left: in bed;with call bell/phone within reach;with bed alarm set   Time: 1259-1340 OT Time  Calculation (min): 41 min Charges:  OT General Charges $OT Visit: 1 Procedure OT Evaluation $Initial OT Evaluation Tier I: 1 Procedure OT Treatments $Self Care/Home Management : 8-22 mins G-Codes:    Evern Bio 07/08/2014, 1:50 PM  [phone]

## 2014-07-08 NOTE — Progress Notes (Signed)
Pt refused CBC lab to be drawn, MD paged. Will recheck lab collection.

## 2014-07-09 LAB — CBC
HEMATOCRIT: 24.4 % — AB (ref 39.0–52.0)
Hemoglobin: 7.7 g/dL — ABNORMAL LOW (ref 13.0–17.0)
MCH: 28.6 pg (ref 26.0–34.0)
MCHC: 31.6 g/dL (ref 30.0–36.0)
MCV: 90.7 fL (ref 78.0–100.0)
Platelets: 122 10*3/uL — ABNORMAL LOW (ref 150–400)
RBC: 2.69 MIL/uL — ABNORMAL LOW (ref 4.22–5.81)
RDW: 16.6 % — AB (ref 11.5–15.5)
WBC: 5.6 10*3/uL (ref 4.0–10.5)

## 2014-07-09 LAB — BASIC METABOLIC PANEL
Anion gap: 8 (ref 5–15)
BUN: 13 mg/dL (ref 6–23)
CO2: 23 mmol/L (ref 19–32)
Calcium: 8.4 mg/dL (ref 8.4–10.5)
Chloride: 107 mmol/L (ref 96–112)
Creatinine, Ser: 0.93 mg/dL (ref 0.50–1.35)
GFR calc Af Amer: >90 mL/min (ref >90)
GFR calc non Af Amer: 82 mL/min — ABNORMAL LOW (ref >90)
Glucose, Bld: 139 mg/dL — ABNORMAL HIGH (ref 70–99)
Potassium: 3.6 mmol/L (ref 3.5–5.1)
SODIUM: 138 mmol/L (ref 135–145)

## 2014-07-09 LAB — GLUCOSE, CAPILLARY
GLUCOSE-CAPILLARY: 125 mg/dL — AB (ref 70–99)
GLUCOSE-CAPILLARY: 193 mg/dL — AB (ref 70–99)
Glucose-Capillary: 111 mg/dL — ABNORMAL HIGH (ref 70–99)

## 2014-07-09 LAB — GI PATHOGEN PANEL BY PCR, STOOL
C DIFFICILE TOXIN A/B: NOT DETECTED
Campylobacter by PCR: NOT DETECTED
Cryptosporidium by PCR: NOT DETECTED
E coli (ETEC) LT/ST: NOT DETECTED
E coli (STEC): NOT DETECTED
E coli 0157 by PCR: NOT DETECTED
G lamblia by PCR: NOT DETECTED
NOROVIRUS G1/G2: NOT DETECTED
ROTAVIRUS A BY PCR: NOT DETECTED
Salmonella by PCR: NOT DETECTED
Shigella by PCR: NOT DETECTED

## 2014-07-09 MED ORDER — HALOPERIDOL 1 MG PO TABS: 3.0000 mg | ORAL_TABLET | Freq: Three times a day (TID) | ORAL | Status: DC

## 2014-07-09 MED ORDER — OXYCODONE HCL ER 15 MG PO T12A: 15.0000 mg | EXTENDED_RELEASE_TABLET | Freq: Two times a day (BID) | ORAL | Status: DC

## 2014-07-09 MED ORDER — PANTOPRAZOLE SODIUM 40 MG PO TBEC: 40.0000 mg | DELAYED_RELEASE_TABLET | Freq: Two times a day (BID) | ORAL | Status: DC

## 2014-07-09 NOTE — Progress Notes (Signed)
Physical Therapy Treatment Patient Details Name: Marchello Rothgeb MRN: [id number] DOB: 04/22/1940 Today's Date: 07/09/2014    History of Present Illness Rocklin Soderquist is a 74 y.o. male with a PMH of Bovine AVR placed 04/2014 by Dr. Dorris Fetch, DM, HTN, HLD, who presents to the ER in hemorrhagic shock with GIB    PT Comments    Pt moving well with RW and nearing baseline status.  Feel that pt's falls at home have been from him not using RW.  Pt verbalized that he needed to use it, but then stated, "sometimes I can't, cause there is too much stuff in the way.". Recommend HHPT for home safety assessment.  Follow Up Recommendations  Home health PT     Equipment Recommendations  None recommended by PT    Recommendations for Other Services       Precautions / Restrictions Precautions Precautions: Fall Precaution Comments: pt reports 10 falls in the last year Restrictions Weight Bearing Restrictions: No    Mobility  Bed Mobility                  Transfers Overall transfer level: Needs assistance Equipment used: Rolling walker (2 wheeled) Transfers: Sit to/from Stand Sit to Stand: Supervision         General transfer comment: good use of hands today  Ambulation/Gait Ambulation/Gait assistance: Supervision Ambulation Distance (Feet): 200 Feet Assistive device: Rolling walker (2 wheeled) Gait Pattern/deviations: Antalgic;Decreased stance time - left;Decreased step length - right;Trunk flexed Gait velocity: WNL   General Gait Details: Cues for posture   Stairs            Wheelchair Mobility    Modified Rankin (Stroke Patients Only)       Balance Overall balance assessment: Needs assistance         Standing balance support: During functional activity;Bilateral upper extremity supported Standing balance-Leahy Scale: Fair Standing balance comment: fair dynamic, Fair + static                    Cognition Arousal/Alertness:  Awake/alert Behavior During Therapy: Flat affect Overall Cognitive Status: History of cognitive impairments - at baseline       Memory: Decreased short-term memory              Exercises General Exercises - Lower Extremity Hip Flexion/Marching: Strengthening;Standing;10 reps (to simulate stairs) Mini-Sqauts: Strengthening;10 reps;Standing    General Comments        Pertinent Vitals/Pain Pain Assessment: No/denies pain    Home Living                      Prior Function            PT Goals (current goals can now be found in the care plan section) Acute Rehab PT Goals Patient Stated Goal: return home and care for my dogs PT Goal Formulation: With patient Time For Goal Achievement: 07/21/14 Potential to Achieve Goals: Good Progress towards PT goals: Progressing toward goals    Frequency  Min 3X/week    PT Plan Current plan remains appropriate    Co-evaluation             End of Session Equipment Utilized During Treatment: Gait belt Activity Tolerance: Patient tolerated treatment well Patient left: in chair;with call bell/phone within reach     Time: 0911-0926 PT Time Calculation (min) (ACUTE ONLY): 15 min  Charges:  $Gait Training: 8-22 mins  G Codes:      Elton Catalano LUBECK 07/09/2014, 9:41 AM

## 2014-07-09 NOTE — Progress Notes (Signed)
Utilization review completed.  

## 2014-07-09 NOTE — Progress Notes (Signed)
CSW provided pt with taxi voucher home. 

## 2014-07-09 NOTE — Discharge Summary (Signed)
Physician Discharge Summary  Peter Weiss ZOX:[id number] DOB: [birth year] DOA: 07/04/2014  PCP: Peter Leitzoss, Carrie M, NP  Admit date: 07/04/2014 Discharge date: 07/09/2014  Time spent: 40 minutes  Recommendations for Outpatient Follow-up:  Hemorrhagic Shock -Resolved. At this point patient's hemoglobin stable  -Would not restart patient's Eliquis or aspirin at this time. Would hold on restarting a minimum of 2 weeks, and may not require restart at all. -Follow-up with Dr Julien NordmannGollan Peter (cardiology). Restart anticoagulation? Aspirin only would be my preferance   Upper GI Bleed (hgb 7.0) with acute blood loss anemia. -See hemorrhagic shock -4/22 Transfused 1 unit PRBC -Continue Protonix 40 mg BID -Please transfuse to keep hemoglobin> 7 -[facility]  Chest pain -Resolved  -Last Cardiac Cath was 03/2014. Mild LAD disease 30%. EKG shows tachycardia, no ischemic changes .   Recent AVR. -Bovine Tissue Valve. -Dr. Charlett LangoSteven Weiss (cardiothoracic surgery) made aware that the patient has been admitted. -CTS and GI will need to determine anticoag/antiplatelet therapy going forward.  Aortic Aneurysm -4.3 centimeters. Outpatient follow up annually.   HTN. -BP has been climbing, continue metoprolol 12.5 mg BID  Adjustment disorder with disturbance of conduct. -Continue Haldol 3 mg TID . -Continue Effexor 150 mg daily on 4/22  -Continue Zoloft 50 mg daily . On 4/22  Chronic Pain syndrome -Per patient secondary to his multiple herniated discs, and knee pain.  -Continue Neurontin 400 mg TID on 4/22 -OxyContin 15 mg BID -Morphine 2 mg QID PRN breakthrough pain  Diabetes type 2 controlled -4/21 hemoglobin A1c= 6.7  HLD -Continue Zocor 20 mg daily  -Lipid panel within ADA guidelines except for low HDL   Urinary frequency -Please follow U/A. -Continue Flomax 0.4 mg daily    Discharge Diagnoses:  Principal Problem:    Hemorrhagic shock Active Problems:   Chronic back pain   Mild cognitive impairment   HLD (hyperlipidemia)   HTN (hypertension)   Adjustment disorder with mixed anxiety and depressed mood   S/P AVR (aortic valve replacement)   Chest pain   Gastrointestinal hemorrhage   Chronic pain syndrome   Urinary frequency   Upper GI bleed   Acute blood loss anemia   Other chest pain   Abdominal aortic aneurysm   Essential hypertension   Adjustment disorder with disturbance of conduct   Pain in the chest   Discharge Condition: Stable  Diet recommendation: Heart healthy  Filed Weights   07/04/14 0409 07/04/14 1315  Weight: 106.595 kg (235 lb) 108 kg (238 lb 1.6 oz)    History of present illness:  Peter Weiss is a 74 y.o. male, who is an unassigned patient, with a PMHx of Bovine AVR placed 04/2014 by Dr. Dorris FetchHendrickson, DM, HTN, HLD. Presentsed to the ER in hemorrhagic shock. Mr. Peter CoryLyons reports he was unable to walk last night so he called EMS. He reports black Loose stools for the last two weeks. He denies ever having black stools before. No GERD, no abdominal pain, no vomiting. He has never seen a gastroenterologist. He also describes chest pain that has been constant since his AVR surgery. It is a pressure sensation over his left breast that is non radiating. He does not indicate that it has become worse recently. Mr. Peter CoryLyons describes very poor living conditions - no electricity, no hot water, no transportation. He reports that he has been out of all of his medications for two weeks (inlcluding eliquis) but has continued to take his aspirin. His UDS is positive  for opiates and benzodiazepines. He also reports he had an appointment today to see his cardiologist, but felt very weak and dizzy when he tried to stand up , associated with some chest pain.  In the ER the patients vitals were as low as BP 68/48 with pulse rates in the 120s- 130s. Hgb was initially 8.1 but dropped to 7.0 with  hydration. Lactic acid was 2.5. Guiac was positive with black melena. CTA Chest was negative for PE but showed an aortic aneurysm of 4.3 cm. CT abdomen/pelvis showed nothing acute. CXR was negative for edema or infiltrate. [name] BP initially responded to IVF, but subsequently dropped again. PCCM and GI (Dr. Elnoria Weiss) were consulted. During his stay patient was found to have one duodenal ulcers and gastritis, requiring 3 units PRBC. After patient was seen by GI. After stopping patient's anticoagulant was able to finally obtain control of bleeding stabilizes H&H.  Consultants: Dr.Patrick Peter Weiss (GI)    Procedure/Significant Events: 4/21 EGD;- Duodenal ulcers - nonbleeding.-Gastritis with nonbleeding erosions. 4/21 PCXR;Chronic cardiomegaly. The patient is status post aortic valve replacement and left atrial appendage clip. 4/21 CT abdomen pelvis with contrast;-No acute chest findings are evident. No PE. -Ascending aortic aneurysm 4.3 cm diameter, roughly stable from 2012. 4/21 CT angiogram PE protocol; see CT abdomen pelvis with contrast results 4/21 transfuse 2 units PRBC 4/22 Transfused 1 unit PRBC    Discharge Exam: Filed Vitals:   07/08/14 0413 07/08/14 1355 07/08/14 2149 07/09/14 0451  BP: 95/52 116/55 107/66 111/71  Pulse: 92 99 97 110  Temp: 98.5 F (36.9 C) 98.3 F (36.8 C) 98.8 F (37.1 C) 97.8 F (36.6 C)  TempSrc: Oral Oral Oral Oral  Resp: 18 18 19 18   Height:      Weight:      SpO2: 97% 96% 93% 92%    General: A/O 4, NAD, No acute respiratory distress Lungs: Clear to auscultation bilaterally without wheezes or crackles Cardiovascular: Regular rate and rhythm without murmur gallop or rub normal S1 and S2 Abdomen: Nontender , nondistended, soft, bowel sounds positive, no rebound, no ascites, no appreciable mass Extremities: No significant cyanosis, clubbing, or edema bilateral lower extremities   Discharge Instructions     Medication List    STOP  taking these medications        apixaban 2.5 MG Tabs tablet  Commonly known as:  ELIQUIS     aspirin 325 MG tablet     aspirin EC 81 MG tablet     furosemide 20 MG tablet  Commonly known as:  LASIX     gabapentin 400 MG capsule  Commonly known as:  NEURONTIN     metoprolol tartrate 25 MG tablet  Commonly known as:  LOPRESSOR     omeprazole 20 MG capsule  Commonly known as:  PRILOSEC     oxycodone 30 MG immediate release tablet  Commonly known as:  ROXICODONE     Oxycodone HCl 10 MG Tabs  Replaced by:  OxyCODONE 15 mg T12a 12 hr tablet     potassium chloride 10 MEQ tablet  Commonly known as:  K-DUR,KLOR-CON      TAKE these medications        donepezil 10 MG tablet  Commonly known as:  ARICEPT  Take 10 mg by mouth at bedtime.     haloperidol 1 MG tablet  Commonly known as:  HALDOL  Take 3 tablets (3 mg total) by mouth 3 (three) times daily.     multivitamin capsule  Take 1 capsule by mouth daily.     OxyCODONE 15 mg T12a 12 hr tablet  Commonly known as:  OXYCONTIN  Take 1 tablet (15 mg total) by mouth every 12 (twelve) hours.     pantoprazole 40 MG tablet  Commonly known as:  PROTONIX  Take 1 tablet (40 mg total) by mouth 2 (two) times daily before a meal.     sertraline 50 MG tablet  Commonly known as:  ZOLOFT  Take 50 mg by mouth daily.     simvastatin 20 MG tablet  Commonly known as:  ZOCOR  Take 1 tablet (20 mg total) by mouth daily.     tamsulosin 0.4 MG Caps capsule  Commonly known as:  FLOMAX  Take 0.4 mg by mouth daily.     venlafaxine XR 150 MG 24 hr capsule  Commonly known as:  EFFEXOR-XR  Take 150 mg by mouth daily with breakfast.       No Known Allergies Follow-up Information    Follow up with [facility]    . Schedule an appointment as soon as possible for a visit in 1 week.   Why:  Follow-up/establish care for upper GI bleed, recent AVR, aortic aneurysm, adjustment disorder with conduct disturbance    Contact information:   [address] [place] [address] [phone]      Follow up with Julien Nordmann, MD. Schedule an appointment as soon as possible for a visit in 2 weeks.   Specialty:  Cardiology   Why:  Follow-up, GI bleed, recent AVR, aortic aneurysm; Restart anticoagulation? Aspirin only would be my preferance    Contact information:   [address] [place] [address] [phone]        The results of significant diagnostics from this hospitalization (including imaging, microbiology, ancillary and laboratory) are listed below for reference.    Significant Diagnostic Studies: Ct Angio Chest Pe W/cm &/or Wo Cm  07/04/2014   CLINICAL DATA:  Midline chest pain with midline abdominal discomfort. History of aortic valve replacement. Symptoms began earlier today.  EXAM: CT ANGIOGRAPHY CHEST  CT ABDOMEN AND PELVIS WITH CONTRAST  TECHNIQUE: Multidetector CT imaging of the chest was performed using the standard protocol during bolus administration of intravenous contrast. Multiplanar CT image reconstructions and MIPs were obtained to evaluate the vascular anatomy. Multidetector CT imaging of the abdomen and pelvis was performed using the standard protocol during bolus administration of intravenous contrast.  CONTRAST:  OMNIPAQUE IOHEXOL 350 MG/ML SOLN  COMPARISON:  CT chest from  [facility]   12/07/2010.  FINDINGS: CTA CHEST FINDINGS  Respiratory degradation. Small pulmonary nodules could be overlooked.  Mediastinum: No filling defects are noted within the pulmonary arterial tree to suggest pulmonary emboli. Heart size is increased. Advanced coronary artery calcification. Prior median sternotomy for AVR. Calcified mitral annulus. No pericardial fluid, thickening or calcification. No acute abnormality of the thoracic aorta or other great vessels of the mediastinum. No pathologically enlarged mediastinal or hilar lymph nodes. The esophagus is normal in  appearance. Dilated ascending aorta measuring 4.3 cm transverse dimension is seen on coronal image 31. Recommend annual imaging followup by CTA or MRA. This recommendation follows 2010 ACCF/AHA/AATS/ACR/ASA/SCA/SCAI/SIR/STS/SVM Guidelines for the Diagnosis and Management of Patients with Thoracic Aortic Disease. Circulation. 2010; 121: O962-X528  Lungs/Pleura: No consolidative airspace disease. No pleural effusions. No suspicious appearing pulmonary nodules or masses.  Musculoskeletal: No aggressive appearing lytic or blastic lesions are noted in the visualized  portions of the skeleton.  CT ABDOMEN and PELVIS FINDINGS  BODY WALL: Unremarkable.  ABDOMEN/PELVIS:  Liver: No focal abnormality.  Small incidental granuloma LEFT lobe.  Biliary: No evidence of biliary obstruction or stone.  Pancreas: Unremarkable.  Spleen: Unremarkable.  Adrenals: Unremarkable.  Kidneys and ureters: No hydronephrosis or stone.  Bladder: Unremarkable.  Reproductive: Unremarkable.  Bowel: No obstruction. Normal appendix.  Retroperitoneum: No mass or adenopathy.  Peritoneum: No free fluid or gas.  Vascular: No acute abnormality.  OSSEOUS: No acute abnormalities.  Spondylosis.  Review of the MIP images confirms the above findings.  IMPRESSION: No acute chest findings are evident. No evidence for pulmonary emboli.  Respiratory motion degrades examination for small pulmonary nodules.  Ascending aortic aneurysm 4.3 cm diameter, roughly stable from 2012.  No acute intra-abdominal findings.   Electronically Signed   By: Davonna Belling Weiss.D.   On: 07/04/2014 07:23   Ct Abdomen Pelvis W Contrast  07/04/2014   CLINICAL DATA:  Midline chest pain with midline abdominal discomfort. History of aortic valve replacement. Symptoms began earlier today.  EXAM: CT ANGIOGRAPHY CHEST  CT ABDOMEN AND PELVIS WITH CONTRAST  TECHNIQUE: Multidetector CT imaging of the chest was performed using the standard protocol during bolus administration of intravenous contrast.  Multiplanar CT image reconstructions and MIPs were obtained to evaluate the vascular anatomy. Multidetector CT imaging of the abdomen and pelvis was performed using the standard protocol during bolus administration of intravenous contrast.  CONTRAST:  OMNIPAQUE IOHEXOL 350 MG/ML SOLN  COMPARISON:  CT chest from [facility]   12/07/2010.  FINDINGS: CTA CHEST FINDINGS  Respiratory degradation. Small pulmonary nodules could be overlooked.  Mediastinum: No filling defects are noted within the pulmonary arterial tree to suggest pulmonary emboli. Heart size is increased. Advanced coronary artery calcification. Prior median sternotomy for AVR. Calcified mitral annulus. No pericardial fluid, thickening or calcification. No acute abnormality of the thoracic aorta or other great vessels of the mediastinum. No pathologically enlarged mediastinal or hilar lymph nodes. The esophagus is normal in appearance. Dilated ascending aorta measuring 4.3 cm transverse dimension is seen on coronal image 31. Recommend annual imaging followup by CTA or MRA. This recommendation follows 2010 ACCF/AHA/AATS/ACR/ASA/SCA/SCAI/SIR/STS/SVM Guidelines for the Diagnosis and Management of Patients with Thoracic Aortic Disease. Circulation. 2010; 121: Z610-R604  Lungs/Pleura: No consolidative airspace disease. No pleural effusions. No suspicious appearing pulmonary nodules or masses.  Musculoskeletal: No aggressive appearing lytic or blastic lesions are noted in the visualized portions of the skeleton.  CT ABDOMEN and PELVIS FINDINGS  BODY WALL: Unremarkable.  ABDOMEN/PELVIS:  Liver: No focal abnormality.  Small incidental granuloma LEFT lobe.  Biliary: No evidence of biliary obstruction or stone.  Pancreas: Unremarkable.  Spleen: Unremarkable.  Adrenals: Unremarkable.  Kidneys and ureters: No hydronephrosis or stone.  Bladder: Unremarkable.  Reproductive: Unremarkable.  Bowel: No obstruction. Normal appendix.  Retroperitoneum: No mass or  adenopathy.  Peritoneum: No free fluid or gas.  Vascular: No acute abnormality.  OSSEOUS: No acute abnormalities.  Spondylosis.  Review of the MIP images confirms the above findings.  IMPRESSION: No acute chest findings are evident. No evidence for pulmonary emboli.  Respiratory motion degrades examination for small pulmonary nodules.  Ascending aortic aneurysm 4.3 cm diameter, roughly stable from 2012.  No acute intra-abdominal findings.   Electronically Signed   By: Davonna Belling Weiss.D.   On: 07/04/2014 07:23   Dg Chest Port 1 View  07/04/2014   CLINICAL DATA:  Chest pain and shortness of breath  EXAM: PORTABLE CHEST - 1 VIEW  COMPARISON:  05/28/2014  FINDINGS: Chronic cardiomegaly. The patient is status post aortic valve replacement and left atrial appendage clip. Hypoventilation. There is no edema, consolidation, effusion, or pneumothorax. No osseous findings to explain chest pain.  IMPRESSION: No active disease.   Electronically Signed   By: Marnee Spring Weiss.D.   On: 07/04/2014 04:23    Microbiology: Recent Results (from the past 240 hour(s))  Urine culture     Status: None   Collection Time: 07/04/14 10:03 AM  Result Value Ref Range Status   Specimen Description URINE, CLEAN CATCH  Final   Special Requests NONE  Final   Colony Count NO GROWTH Performed at [facility]   Final   Culture NO GROWTH Performed at [facility]   Final   Report Status 07/05/2014 FINAL  Final  MRSA PCR Screening     Status: None   Collection Time: 07/04/14  1:00 PM  Result Value Ref Range Status   MRSA by PCR NEGATIVE NEGATIVE Final    Comment:        The GeneXpert MRSA Assay (FDA approved for NASAL specimens only), is one component of a comprehensive MRSA colonization surveillance program. It is not intended to diagnose MRSA infection nor to guide or monitor treatment for MRSA infections.   Clostridium Difficile by PCR     Status: None   Collection Time: 07/04/14  9:18 PM  Result  Value Ref Range Status   C difficile by pcr NEGATIVE NEGATIVE Final     Labs: Basic Metabolic Panel:  Recent Labs Lab 07/05/14 0225 07/06/14 0311 07/07/14 0252 07/08/14 0247 07/09/14 0518  NA 139 143 142 138 138  K 3.4* 3.4* 3.6 3.4* 3.6  CL 110 112 112 106 107  CO2 21 24 25 23 23   GLUCOSE 132* 112* 114* 108* 139*  BUN 32* 14 11 12 13   CREATININE 0.89 0.89 0.90 0.97 0.93  CALCIUM 8.3* 8.5 8.6 8.1* 8.4  MG 1.9 1.8 1.8 1.7  --   PHOS 3.0 3.7 4.7* 3.8  --    Liver Function Tests:  Recent Labs Lab 07/04/14 0405  AST 14  ALT 12  ALKPHOS 43  BILITOT 0.5  PROT 5.7*  ALBUMIN 3.1*   No results for input(s): LIPASE, AMYLASE in the last 168 hours. No results for input(s): AMMONIA in the last 168 hours. CBC:  Recent Labs Lab 07/05/14 0225  07/06/14 0311  07/07/14 0252 07/07/14 1200 07/08/14 07/08/14 0247 07/08/14 2041 07/09/14 0518  WBC 9.2  --  6.6  --  5.8  --   --  6.5 6.4 5.6  NEUTROABS 6.9  --  4.3  --  3.8  --   --  4.4  --   --   HGB 7.4*  < > 7.6*  < > 7.9* 7.7* 7.6* 7.3* 7.6* 7.7*  HCT 23.0*  < > 23.3*  < > 24.4* 24.0* 23.2* 22.6* 24.1* 24.4*  MCV 88.8  --  89.3  --  90.7  --   --  89.0 90.3 90.7  PLT 121*  --  108*  --  112*  --   --  122* 116* 122*  < > = values in this interval not displayed. Cardiac Enzymes:  Recent Labs Lab 07/04/14 0850  TROPONINI 0.05*   BNP: BNP (last 3 results)  Recent Labs  04/18/14 1404 07/04/14 0425  BNP 235.4* 67.0    ProBNP (last 3 results) No results for  input(s): PROBNP in the last 8760 hours.  CBG:  Recent Labs Lab 07/08/14 1123 07/08/14 1628 07/08/14 2148 07/09/14 0636 07/09/14 1129  GLUCAP 115* 190* 136* 111* 125*       Signed:  Carolyne Littles, MD [facility] [phone] pager

## 2014-07-10 NOTE — Clinical Social Work Psychosocial (Signed)
CSW spoke with pt concerning lack of electricity and hot water.  Pt states that he owes almost $1000 to [employer] and they won't provide electricity to his new residence until the debt is paid.  CSW aided pt in calling [employer] and spoke with a representative about his balance- rep states that balance is only $100- pt states that he is able to pay this amount on May 3rd when he gets his check.    Pt states that other than a lack of electricity his home is in good shape and almost paid off.  Pt also states he does not have funds to pay for his medications when he DCs- [facility].  CSW signing off.  Merlyn LotJenna Holoman, LCSWA Clinical Social Worker [phone]

## 2014-07-12 ENCOUNTER — Ambulatory Visit: Payer: Medicare Other | Attending: Family Medicine | Admitting: Family Medicine

## 2014-07-12 ENCOUNTER — Encounter: Payer: Self-pay | Admitting: Family Medicine

## 2014-07-12 VITALS — BP 132/82 | HR 121 | Temp 98.7°F | Resp 18 | Ht 70.0 in | Wt 237.0 lb

## 2014-07-12 DIAGNOSIS — E118 Type 2 diabetes mellitus with unspecified complications: Secondary | ICD-10-CM | POA: Diagnosis not present

## 2014-07-12 DIAGNOSIS — I714 Abdominal aortic aneurysm, without rupture, unspecified: Secondary | ICD-10-CM

## 2014-07-12 DIAGNOSIS — E785 Hyperlipidemia, unspecified: Secondary | ICD-10-CM | POA: Diagnosis not present

## 2014-07-12 DIAGNOSIS — I10 Essential (primary) hypertension: Secondary | ICD-10-CM | POA: Diagnosis present

## 2014-07-12 DIAGNOSIS — K922 Gastrointestinal hemorrhage, unspecified: Secondary | ICD-10-CM | POA: Diagnosis not present

## 2014-07-12 DIAGNOSIS — Z954 Presence of other heart-valve replacement: Secondary | ICD-10-CM | POA: Insufficient documentation

## 2014-07-12 DIAGNOSIS — Z952 Presence of prosthetic heart valve: Secondary | ICD-10-CM

## 2014-07-12 DIAGNOSIS — G894 Chronic pain syndrome: Secondary | ICD-10-CM | POA: Diagnosis not present

## 2014-07-12 LAB — GLUCOSE, POCT (MANUAL RESULT ENTRY): POC GLUCOSE: 224 mg/dL — AB (ref 70–99)

## 2014-07-12 MED ORDER — TRAMADOL HCL 50 MG PO TABS: 50.0000 mg | ORAL_TABLET | Freq: Three times a day (TID) | ORAL | Status: DC | PRN

## 2014-07-12 NOTE — Progress Notes (Signed)
Subjective:    Patient ID: Peter Weiss, male    DOB: [birth year], 74 y.o.   MRN: [id number]  HPI  Admit Date: 07/04/14 Discharge Date : 07/09/14  Peter Weiss is a 74 year old male with a history of Atrial fibrillation (previously on anticoagulation with Eliquis), AVR, DM, abdominal aortic aneurysm,Hypertension who presented to [facility] ED with difficulty walking,fatigue, dizziness and history of dark tarry stools and was found to be in hemorrhagic shock with a BP of 68/48, HR 120-130. He had aortic valve repair and left appendage clip 2 months ago and still has pain at the site. Hgb was initially 8.1 but dropped to 7.0 with hydration. Lactic acid was 2.5. Guiac was positive with black melena. CTA Chest was negative for PE but showed an aortic aneurysm of 4.3 cm. CT abdomen/pelvis showed nothing acute. CXR was negative for edema or infiltrate  But showed stable cardiomegaly. Mr Peter Weiss's BP initially responded to IVF, but subsequently dropped again. Pulmonary and cirtical care medicine, Cardiothoracic surgery and GI were consulted. During his stay he had an EGD and patient was found to have one duodenal ulcer and gastritis, requiring 3 units PRBC. After stopping patient's anticoagulant bleeding. He was placed on Protonix and his discharge hemoglobin was 7.7 and BP improved to 111/71  Interval History: He denies any dark tarry stools; is yet to pick up his medications from his Pharmacy and states he is not aware of any appointment with the Cardiologist. He has left over Eliquis tablets which he has been taking despite Eliquis being on hold due to GI bleed. Complains he is in pain as the pharmacy would not fill his prescription for Oxycodone  bid because his insurance would not pay for it; he states this is "25%of the dose of pain medications he got from his previous physician while at [facility]". He is unsure if he was seeing pain management; he takes the opioids for herniated discs  in his back he states.  Past Medical History  Diagnosis Date  . Arthritis   . Chicken pox   . Heart murmur   . Hypertension   . Hyperlipidemia   . Urinary incontinence   . History of fainting spells of unknown cause   . Diabetes mellitus without complication   . Depression   . Aortic stenosis     Past Surgical History  Procedure Laterality Date  . Goiter  2008  . Multiple extractions with alveoloplasty N/A 04/24/2014    Procedure: Extraction of tooth #'s 2,3,5,12,13,14,15,20 with allveoloplasty and gross debridement of remaining dentition and sectioning of bridge at mesial of abutment tooth #32.;  Surgeon: Charlynne Pander, DDS;  Location: [facility] OR;  Service: Oral Surgery;  Laterality: N/A;  . Aortic valve replacement N/A 04/26/2014    Procedure: AORTIC VALVE REPLACEMENT (AVR);  Surgeon: Loreli Slot, MD;  Location: [facility] OR;  Service: Open Heart Surgery;  Laterality: N/A;  . Maze N/A 04/26/2014    Procedure: MAZE;  Surgeon: Loreli Slot, MD;  Location: [facility] OR;  Service: Open Heart Surgery;  Laterality: N/A;  . Tee without cardioversion N/A 04/26/2014    Procedure: TRANSESOPHAGEAL ECHOCARDIOGRAM (TEE);  Surgeon: Loreli Slot, MD;  Location: [facility] OR;  Service: Open Heart Surgery;  Laterality: N/A;  . Esophagogastroduodenoscopy N/A 07/04/2014    Procedure: ESOPHAGOGASTRODUODENOSCOPY (EGD);  Surgeon: Jeani Hawking, MD;  Location: [facility] ENDOSCOPY;  Service: Endoscopy;  Laterality: N/A;    Family History  Problem Relation Age of Onset  .  Alcohol abuse Mother   . Heart disease Mother   . Diabetes Mother   . Heart disease Father   . Diabetes Father   . Cancer Mother   . Cancer Father   . Alcohol abuse Father   . Alcohol abuse Brother     History   Social History  . Marital Status: Married    Spouse Name: N/A  . Number of Children: N/A  . Years of Education: N/A   Occupational History  . Not on file.   Social History Main Topics  . Smoking status: Never Smoker     . Smokeless tobacco: Never Used  . Alcohol Use: No  . Drug Use: No  . Sexual Activity: Not Currently   Other Topics Concern  . Not on file   Social History Narrative   ** Merged History Encounter **        No Known Allergies  Current Outpatient Prescriptions on File Prior to Visit  Medication Sig Dispense Refill  . donepezil (ARICEPT) 10 MG tablet Take 10 mg by mouth at bedtime.    . sertraline (ZOLOFT) 50 MG tablet Take 50 mg by mouth daily.    . haloperidol (HALDOL) 1 MG tablet Take 3 tablets (3 mg total) by mouth 3 (three) times daily. (Patient not taking: Reported on 07/12/2014) 90 tablet 0  . Multiple Vitamin (MULTIVITAMIN) capsule Take 1 capsule by mouth daily.    . OxyCODONE (OXYCONTIN) 15 mg T12A 12 hr tablet Take 1 tablet (15 mg total) by mouth every 12 (twelve) hours. (Patient not taking: Reported on 07/12/2014) 60 tablet 0  . pantoprazole (PROTONIX) 40 MG tablet Take 1 tablet (40 mg total) by mouth 2 (two) times daily before a meal. (Patient not taking: Reported on 07/12/2014) 60 tablet 0  . simvastatin (ZOCOR) 20 MG tablet Take 1 tablet (20 mg total) by mouth daily. (Patient not taking: Reported on 07/04/2014) 30 tablet 1  . tamsulosin (FLOMAX) 0.4 MG CAPS capsule Take 0.4 mg by mouth daily.     No current facility-administered medications on file prior to visit.       Review of Systems  Constitutional: Negative for activity change and appetite change.  HENT: Negative for sinus pressure and sore throat.   Eyes: Negative for visual disturbance.  Respiratory: Negative for chest tightness and shortness of breath.   Cardiovascular: Negative for chest pain and palpitations.  Gastrointestinal: Negative for abdominal pain and abdominal distention.  Endocrine: Negative for cold intolerance, heat intolerance and polyphagia.  Genitourinary: Negative for dysuria, frequency and difficulty urinating.  Musculoskeletal: Positive for back pain. Negative for joint swelling and  arthralgias.  Skin: Negative for color change.  Neurological: Negative for dizziness, tremors and weakness.  Psychiatric/Behavioral: Negative for suicidal ideas and behavioral problems.         Objective: Filed Vitals:   07/12/14 1555  BP: 132/82  Pulse: 121  Temp: 98.7 F (37.1 C)  Resp: 18      Physical Exam  Constitutional: He is oriented to person, place, and time. He appears well-developed and well-nourished.  HENT:  Head: Normocephalic and atraumatic.  Right Ear: External ear normal.  Left Ear: External ear normal.  Eyes: Conjunctivae and EOM are normal. Pupils are equal, round, and reactive to light.  Neck: Normal range of motion. Neck supple. No tracheal deviation present.  Cardiovascular: Regular rhythm and normal heart sounds.  Tachycardia present.   No murmur heard. Pulmonary/Chest: Effort normal and breath sounds normal. No respiratory distress. He  has no wheezes. He exhibits no tenderness.  Abdominal: Soft. Bowel sounds are normal. He exhibits no mass. There is no tenderness.  Musculoskeletal: He exhibits no edema or tenderness.  Neurological: He is alert and oriented to person, place, and time.  Skin: Skin is warm and dry.  Psychiatric: He has a normal mood and affect.            Assessment & Plan:  74 year old male with a history of Atrial fibrillation recent AVR, type 2 DM, chronic pain and recent hospitalization for upper GI bleed secondary to gastritis and duodenal ulcer.  Upper GI bleed: No recent black tarry stools. Secondary to gastritis and duodenal ulcer Discharge Hgb was 7.7; will recheck CBC today He is still taking Eliquis despite the fact that this was held and I have emphasized he needs to hold it.  DM: Controlled with A1c of 6.7 Continue current management  Atrial fibrillation: Eliquis on hold till seen by Cardiology; called Cardiology office to obtain appointment for 07/31/14 CHADS VASC score of 3 Not a candidate for long term  anticoagulation as per Cardiac surgeon.  Chronic pain: He is a high risk patient and is at risk for drug overdose; I do not think it is safe for him to be on huge doses of Oxycodone. I will give him a few tramadol pills and refer to Pain mgt but have informed him he will not be receiving Oxycodone here and he states he will search for another Physician who will write his prescription for him. He is not happy about the fact that his insurance company will not pay for his Oxycodone.    Abdominal aortic aneurysm: Diameter of 4.3, annual surveillance  Polypharmacy: He has a bag full of multiple medications with several bottles of the same medications and I cannot exclude overdosing at this time even though some of his bottles are empty; he will definitely need help with medication admistration and so I have called social work in to discuss visiting nurse services.

## 2014-07-12 NOTE — Progress Notes (Signed)
ASSESSMENT: Pt currently experiencing pain and would benefit from referrals to both the pain clinic and to home health agency. Stage of Change: pre-contemplative  PT AGREES TO FOLLOWING TREATMENT PLAN: 1. F/U with behavioral health consultant in one week 2. Psychiatric Medications: Zoloft, Haldol 3. Behavioral recommendation(s):   -Consider attending pain clinic when referral goes through -Consider home health care when referral goes through to agency  SUBJECTIVE: Pt. referred by Dr. Venetia NightAmao for referral to home health. Pt. here for  REFERRAL  regarding home health referral Pt. reports the following symptoms/concerns: Pt states that he was on oxycodone for over ten years and now he wants to know why he is unable to obtain the same medication. Pt does state that he knows that some people are addicted, and that perhaps that is why PCP is reluctant to give him Oxycodone. He says that he told the PCP that he does not want a referral to the pain clinic, but that he changed his mind, and does want a referral. He says that he want help around the house, and that he used to have someone from home health come out and help; states that he would like to have that kind of help again.  Duration of problem: recent Severity: mild  OBJECTIVE: Orientation & Cognition: Oriented x3. Thought processes normal and appropriate to situation. Mood: irritated Affect: appropriate Appearance: appropriate Risk of harm to self or others: no risk of harm to self or others Substance use: none Psychiatric medication use: Unchanged  Assessments administered: risk assessment  Diagnosis: Adjustment disorder with mixed anxiety and depressed mood CPT Code: F43.23 -------------------------------------------- Other(s) present in the room: neighbor  Time spent with patient in exam room:  20 minutes

## 2014-07-12 NOTE — Progress Notes (Signed)
Patient hospitalized for heart valve replacement about a month ago. Patient needs med refills. Patient reports steady, left-sided chest pain, started after operation. Patient has been to cardiology to have this chest pain checked.

## 2014-07-12 NOTE — Patient Instructions (Signed)

## 2014-07-13 LAB — CBC WITH DIFFERENTIAL/PLATELET
Basophils Absolute: 0.1 10*3/uL (ref 0.0–0.1)
Basophils Relative: 1 % (ref 0–1)
Eosinophils Absolute: 0.2 10*3/uL (ref 0.0–0.7)
Eosinophils Relative: 3 % (ref 0–5)
HCT: 27.8 % — ABNORMAL LOW (ref 39.0–52.0)
HEMOGLOBIN: 8.7 g/dL — AB (ref 13.0–17.0)
LYMPHS ABS: 0.8 10*3/uL (ref 0.7–4.0)
Lymphocytes Relative: 12 % (ref 12–46)
MCH: 27.8 pg (ref 26.0–34.0)
MCHC: 31.3 g/dL (ref 30.0–36.0)
MCV: 88.8 fL (ref 78.0–100.0)
MPV: 9.4 fL (ref 8.6–12.4)
Monocytes Absolute: 0.5 10*3/uL (ref 0.1–1.0)
Monocytes Relative: 8 % (ref 3–12)
NEUTROS PCT: 76 % (ref 43–77)
Neutro Abs: 4.9 10*3/uL (ref 1.7–7.7)
PLATELETS: 253 10*3/uL (ref 150–400)
RBC: 3.13 MIL/uL — AB (ref 4.22–5.81)
RDW: 16.4 % — ABNORMAL HIGH (ref 11.5–15.5)
WBC: 6.5 10*3/uL (ref 4.0–10.5)

## 2014-07-14 NOTE — Discharge Summary (Signed)
PATIENT NAME:  Peter Weiss, Peter Weiss MR#:  [id number] DATE OF BIRTH:  1940/04/21  DATE OF ADMISSION:  03/19/2014 DATE OF DISCHARGE:  03/21/2014  The patient left AGAINST MEDICAL ADVICE on 03/20/2014.   DIAGNOSES AT DISCHARGE: During the hospital were as follows:  1. Atrial fibrillation, chronic. 2. Frequent falls and weakness.  3. Elevated troponin likely in the setting of demand ischemia.  4. Dementia.  5. Depression.  6. Hyperlipidemia.   CONSULTANTS: During the hospital course: Dr. Mariah MillingGollan from cardiology.   PERTINENT STUDIES: Done during the hospital course: A CT scan of the head done without contrast showing atrophy and chronic microvascular ischemia. No acute abnormality. A chest x-ray done on admission showing no acute cardiopulmonary disease. A 2-dimensional echocardiogram done showing ejection fraction of 55% to 60% with severe aortic valve stenosis with a peak gradient of 76, moderately dilated left atrium, mitral valve regurgitation.   HOSPITAL COURSE: This is a 74 year old male who presented to the hospital on 03/19/2014 due to frequent falls and weakness and was noted to have an elevated troponin.  1. Elevated troponin. This was likely in the setting of demand ischemia from his atrial fibrillation and frequent falls. The patient was seen and cardiology did not recommend any acute intervention. They did not think that the patient had any evidence of acute coronary syndrome. The patient was being managed medically with aspirin, beta blockers and statins for now.  2. Frequent falls and weakness. The patient was seen by physical therapy. They recommended home health services, although the patient denied those. The patient was hypotensive in the ER and received some IV fluids. His orthostatic vital signs were checked and they were negative.  3. Atrial fibrillation. The patient remained rate controlled in the hospital on some metoprolol as needed. He was a high fall risk; therefore, he was not  placed on long-term anticoagulation. His echocardiogram showed normal ejection fraction with severe aortic stenosis.  4. Dementia. The patient was maintained on his Aricept.  5. Depression. The patient was maintained on his Effexor.  6. Hyperlipidemia. The patient was maintained on his simvastatin.   The patient did not have primary care physician, as he had just moved to the area. Case management has helped us get him an appointment with Dr. Chaney Mallingejan-Sei as an outpatient. The patient was going to be observed overnight for another 24 hours to follow his hemodynamics, although patient left against medical advice in the late evening of 03/20/2014.   The patient is a full code.   TIME SPENT ON DISCHARGE: 35 minutes.     ____________________________ Rolly PancakeVivek J. Cherlynn KaiserSainani, MD vjs:TT D: 03/21/2014 08:40:00 ET T: 03/21/2014 12:53:32 ET JOB#: [id number]  cc: Rolly PancakeVivek J. Cherlynn KaiserSainani, MD, <Dictator> Houston SirenVIVEK J Druscilla Petsch MD ELECTRONICALLY SIGNED 04/09/2014 10:39

## 2014-07-14 NOTE — Consult Note (Signed)
General Aspect Primary Cardiologist: New to [facility] _______________  74 year old male who left AMA before his echo results were available during his last admission 1/5-1/6 with history of recently diagnosed severe aortic stenosis (Peak gradient of 76 mm Hg. Mean graient of 53 mm Hg. Peak velocity of 437 mm Hg on 03/21/14), HTN, HLD, DM2, mild cognitive impairment, GERD, and chronic back pain on narcotics who presented to [facility] again on 1/7 after apparently going to a new PCP, though he cannot tell me who this was, what the clinic name was, or where it was, and being told he was hypotensive (BP unknown). He was advised to return to [facility]. He was admitted for cardiac cath today.  _____________   PMH: 1. HTN 2. HLD 3. DM2 4. Mild cognitive impairment 5. GERD 6. Chronic back pain on narcotics 7. Recently diagnosed severe aortic stenosis as above ______________   Present Illness 74 year old male with the above problem list who was recently admitted to [facility] from 1/5-1/6, but left AMA before his echo resutls were available returned to [facility] on 1/7 after apparently going to a new PCP, though he cannot tell me who this was, what the clinic name was, or where it was, and being told he was hypotensive (BP unknown). He noted at that time he conitnued to still be somewhat unsteady on his feet and new new symptoms.   He denies any prior known cardiac history upon his initial presentation on 03/19/2014.  Previously lived in [place], [place] but recently relocated to [place]. Established with [facility] but was unfortunately dismissed from their practice. He is currently looking for a PCP. He reports for the past several months he has been experiencing increased falls at home (sometimes he only falls backwards into the chair he was trying to stand up from, others he falls all the way down to the ground). He has also noted an increase in his SOB and lower extremity swelling. This has limited his ability to  perform daily activites. Of note, he has lost approximately 80-90 pounds over the past 1 year. He was on Lasix 40 mg daily at home, but had changed his regimen to either every other day or not at all 2/2 he did not want to void all of the time. He sometimes would wet himself. No orthopnea or early satiety, though he does sleep in a recliner (2/2 back pain). He reports over the past 2-3 months he has probably fallen at least 10 times. He denies taking any extra medication. No ETOH for at least 1 year. No illicit drugs.   Upon his inital arrival to the [facility] ED he was found to be in newly documented a-fib, rate controlled in the 90s-low 100s. Only prior office note I have from PCP is from 09/2013 which indicated normal rate and rhythm. No EKG was done at that OV. UDS positive for opiates. Troponin 0.14-->0.12-->0.11. SCr 1.37. On telemetry he remains in rate controlled a-fib. CT of the head showed no acute abnormality. CXR showed cardiomegaly, no acute process. Unfortuantely, he left AGAINST MEDICAL ADVICE prior to his echo being available. Ultimately his echo did show EF 55-60%, mild LVH, severe aortic stenosis (Peak gradient of 76 mm Hg. Mean graient of 53 mm Hg. Peak velocity of 437 mm Hg), normal RV size and systolic fxn, mildly dilated LA, mild MR, nl RVSP.   Apparently he presented to a new PCP though he cannot provide any details on this person or  their office location and was found to be hypotensive (he cannot recall his BP). He was advised to return to [facility]. He reports no new symptoms at that time and continues to report no new symptoms. He is asking for pain medication, wants "50 mg." He is currently resting comfortably in the bed.   Physical Exam:  GEN well developed, well nourished, no acute distress, obese   HEENT hearing intact to voice, moist oral mucosa   NECK supple   RESP normal resp effort  clear BS   CARD Irregular rate and rhythm  Murmur   Murmur Systolic  3/6   ABD denies  tenderness  soft   LYMPH negative neck   EXTR Trace pedal edema   SKIN normal to palpation   NEURO cranial nerves intact   PSYCH alert   Review of Systems:  Subjective/Chief Complaint high headed   General: Fatigue  Weakness   Skin: No Complaints   ENT: No Complaints   Eyes: No Complaints   Neck: No Complaints   Respiratory: Short of breath   Cardiovascular: Edema   Gastrointestinal: No Complaints   Genitourinary: No Complaints   Vascular: No Complaints    Musculoskeletal: Muscle or joint pain  Muscle or Joint Stiffness   Neurologic: Dizzness  frequent falls   Hematologic: No Complaints   Endocrine: No Complaints   Psychiatric: No Complaints   Review of Systems: All other systems were reviewed and found to be negative   Medications/Allergies Reviewed Medications/Allergies reviewed   Family & Social History:  Family and Social History:  Family History Coronary Artery Disease  mother: CAD, CA, DM ETOH abuse; father: CAD, CA, DM, ETOH abuse   Social History negative tobacco, negative ETOH, negative Illicit drugs   Place of Living Home     Heart Murmer:    Diabetes Mellitus, Type II (NIDD):    Chronic Back Pain:    disk:    hypertension:    brain tumor:   Home Medications: Medication Instructions Status  metoprolol succinate 50 mg oral tablet, extended release 1 tab(s) orally once a day Active  simvastatin 20 mg oral tablet 1 tab(s) orally once a day (at bedtime) Active  venlafaxine 150 mg oral capsule, extended release 1 cap(s) orally once a day Active  furosemide 40 mg oral tablet 1 tab(s) orally once a day Active  donepezil 10 mg oral tablet 1 tab(s) orally once a day (at bedtime) Active  gabapentin 400 mg oral capsule 1 cap(s) orally 3 times a day Active  losartan 50 mg oral tablet 1 tab(s) orally once a day Active  omeprazole 20 mg oral delayed release capsule 1 cap(s) orally once a day Active   Lab Results:  Hepatic:  08-Jan-16  04:04   Bilirubin, Total 0.3  Alkaline Phosphatase 61 (46-116 NOTE: New Reference Range 10/02/13)  SGPT (ALT) 20 (14-63 NOTE: New Reference Range 10/02/13)  SGOT (AST) 22  Total Protein, Serum  6.2  Albumin, Serum  2.9  Routine Chem:  07-Jan-16 13:07   BUN  26  Creatinine (comp)  1.46  08-Jan-16 04:04   Glucose, Serum  138  BUN  26  Creatinine (comp) 1.21  Sodium, Serum 140  Potassium, Serum 4.1  Chloride, Serum 106  CO2, Serum 28  Calcium (Total), Serum 8.6  Osmolality (calc) 286  eGFR (African American) >60  eGFR (Non-African American) >60 (eGFR values <60m/min/1.73 m2 may be an indication of chronic kidney disease (CKD). Calculated eGFR, using the MRDR Study equation, is useful  in  patients with stable renal function. The eGFR calculation will not be reliable in acutely ill patients when serum creatinine is changing rapidly. It is not useful in patients on dialysis. The eGFR calculation may not be applicable to patients at the low and high extremes of body sizes, pregnant women, and vegetarians.)  Anion Gap  6  Cardiac:  07-Jan-16 13:07   Troponin I  0.08 (0.00-0.05 0.05 ng/mL or less: NEGATIVE  Repeat testing in 3-6 hrs  if clinically indicated. >0.05 ng/mL: POTENTIAL  MYOCARDIAL INJURY. Repeat  testing in 3-6 hrs if  clinically indicated. NOTE: An increase or decrease  of 30% or more on serial  testing suggests a  clinically important change)  Routine Coag:  08-Jan-16 04:04   Prothrombin 13.0  INR 1.0 (INR reference interval applies to patients on anticoagulant therapy. A single INR therapeutic range for coumarins is not optimal for all indications; however, the suggested range for most indications is 2.0 - 3.0. Exceptions to the INR Reference Range may include: Prosthetic heart valves, acute myocardial infarction, prevention of myocardial infarction, and combinations of aspirin and anticoagulant. The need for a higher or lower target INR must be  assessed individually. Reference: The Pharmacology and Management of the Vitamin K  antagonists: the seventh ACCP Conference on Antithrombotic and Thrombolytic Therapy. LOVFI.4332 Sept:126 (3suppl): [id number]. A HCT value >55% may artifactually increase the PT.  In one study,  the increase was an average of 25%. Reference:  "Effect on Routine and Special Coagulation Testing Values of Citrate Anticoagulant Adjustment in Patients with High HCT Values." American Journal of Clinical Pathology 2006;126:400-405.)  Routine Hem:  08-Jan-16 04:04   WBC (CBC) 6.1  RBC (CBC)  3.31  Hemoglobin (CBC)  10.0  Hematocrit (CBC)  30.6  Platelet Count (CBC) 160  MCV 93  MCH 30.1  MCHC 32.5  RDW 14.4  Neutrophil % 65.1  Lymphocyte % 20.3  Monocyte % 9.3  Eosinophil % 4.5  Basophil % 0.8  Neutrophil # 4.0  Lymphocyte # 1.2  Monocyte # 0.6  Eosinophil # 0.3  Basophil # 0.0 (Result(s) reported on 22 Mar 2014 at 04:44AM.)   EKG:  EKG Interp. by me   Interpretation EKG shows a-fib, 81 bpm, RBBB, inferior TWI   Radiology Results: XRay:    07-Jan-16 15:29, Chest PA and Lateral  Chest PA and Lateral   REASON FOR EXAM:    hypoxia  COMMENTS:       PROCEDURE: DXR - DXR CHEST PA (OR AP) AND LATERAL  - Mar 21 2014  3:29PM     CLINICAL DATA:  Shortness of breath for 1 month.    EXAM:  CHEST  2 VIEW    COMPARISON:  January 5th 2016    FINDINGS:  The heart size and mediastinal contours are stable. The aorta is  tortuous. The heart size is enlarged. There is no focal infiltrate,  pulmonary edema, or pleural effusion. The visualized skeletal  structures are stable.     IMPRESSION:  No active cardiopulmonary disease.      Electronically Signed    By: Abelardo Diesel M.D.    On: 03/21/2014 15:40         Verified By: Abelardo Diesel, M.D.,    No Known Allergies:   Vital Signs/Nurse's Notes: **Vital Signs.:   08-Jan-16 04:50  Vital Signs Type Routine  Temperature Temperature (F) 98.1   Celsius 36.7  Temperature Source oral  Pulse Pulse 83  Respirations Respirations  20  Systolic BP Systolic BP 071  Diastolic BP (mmHg) Diastolic BP (mmHg) 88  Mean BP 103  Pulse Ox % Pulse Ox % 94  Pulse Ox Activity Level  At rest  Oxygen Delivery Room Air/ 21 %    Impression 74 year old male who left AMA before his echo results were available during his last admission 1/5-1/6 with history of recently diagnosed severe aortic stenosis (Peak gradient of 76 mm Hg. Mean graient of 53 mm Hg. Peak velocity of 437 mm Hg on 03/21/14), HTN, HLD, DM2, mild cognitive impairment, GERD, and chronic back pain on narcotics who presented to [facility] again on 1/7 after apparently going to a new PCP, though he cannot tell me who this was, what the clinic name was, or where it was, and being told he was hypotensive (BP unknown). He was advised to return to [facility]. He was admitted for cardiac cath today.   1. Severe aortic stenosis: -He is for cardiac cath today to evaluate his coronary anatomy for the possibility of TAVR vs open AVR -Unlikely his aortic stenosis is causing his gait instabiltiy given his presentation of falling backwards (would check orthostatics) -Volume management is key in him at this time (not too much or too little)  2. Elevated troponin: -Possibly in the setting of a-fib vs volume overload -Downward trending troponin 0.14-->0.12-->0.11-->0.08 (this admission) possibly down trending from initial set -Cardiac cath as above -Hold heparin gtt at this time given the downward trending nature of his troponin, frequent fall history, and unsteady gait   3. A-fib: -Uncertain of the chronicity at this time -Rate controlled -CHADSVASc 3 giving him an estimated annual risk of 3.2% of stroke  -Patient with at least 10 falls recently - risk of anticoagulation is significant, hold at this time, not a good long term AC candidate 2/2 the above -Would maintain current rhythm   4. Hypotensive/gait  unsteadiness: -Check orthostatics, was dry on arrival BP stable on current meds could decrease lasix down to 20 mg daily  5. AMS improved, stable  6. DM2 Stable   Electronic Signatures: Rise Mu (PA-C)  (Signed 08-Jan-16 08:14)  Authored: General Aspect/Present Illness, History and Physical Exam, Review of System, Family & Social History, Past Medical History, Home Medications, Labs, EKG , Radiology, Allergies, Vital Signs/Nurse's Notes, Impression/Plan Ida Rogue (MD)  (Signed 08-Jan-16 11:38)  Authored: General Aspect/Present Illness, History and Physical Exam, Review of System, Family & Social History, EKG , Impression/Plan  Co-Signer: General Aspect/Present Illness, History and Physical Exam, Review of System, Family & Social History, Past Medical History, Home Medications, Labs, EKG , Radiology, Allergies, Vital Signs/Nurse's Notes, Impression/Plan   Last Updated: 08-Jan-16 11:38 by Ida Rogue (MD)

## 2014-07-14 NOTE — H&P (Signed)
PATIENT NAME:  Peter Weiss, Peter Weiss MR#:  [id number] DATE OF BIRTH:  22-Nov-1940  DATE OF ADMISSION:  03/19/2014  ADMITTING PHYSICIAN: Peter Baas, MD   PRIMARY CARE PHYSICIAN: Currently none; used to be in [facility], previously.  PRIMARY PAIN MANAGEMENT PHYSICIAN: Dr. Tyler Weiss, last seen on 03/13/2014, at [facility].   CHIEF COMPLAINT: Confusion and falls.   HISTORY OF PRESENT ILLNESS: Peter Weiss is a 74 year old, Caucasian male with past medical history notably significant for hypertension, diabetes, chronic back pain following with a pain specialist, was dropped off by a friend secondary to confusion and falls at home. The patient is drowsy, arousable, but is able to provide most of his information, though has good humor. Sometimes gets confused. The patient states he was in [facility] before and recently moved down here and has been living with his friend and his girlfriend for the last 2 to 3 years. He has very severe back issues and also left knee pain, for which he has been following up with the pain management physician. He is on oxycodone and Neurontin. He denies taking any extra medications. He feels he has taken it like he has been supposed to. Denies any chest pain. Over the last several months, he has been having worsening dyspnea on minimal exertion and is unable to do any much activities. Lately, over the last couple of days, he has had a couple of falls associated with dizziness. No chest pain but dyspnea, and was asked by his friend to be seen in the Emergency Room. In the ER, he is noted to be in sinus arrhythmia, heart rate ranging from 90 to 110 and has 2+ significant pedal edema. His urine toxicology screen is positive for opiates, and he has a mildly elevated troponin of 0.14. So, he is admitted for the same.   PAST MEDICAL HISTORY:  1. Degenerative disk disease with chronic low back pain.  2. Hypertension.  3. Diabetes mellitus.   PAST SURGICAL HISTORY: Mass resected from his  neck; not sure if it was thyroid or not.   ALLERGIES TO MEDICATIONS: No known drug allergies.   CURRENT MEDICATIONS: The following are the verified home medications. Though there are no narcotics here, he does say he is on oxycodone from his pain management physician.  His current medications are: 1. Donepezil 10 mg p.o. at bedtime.  2. Lasix 40 mg p.o. daily.  3. Gabapentin 400 mg p.o. 3 times a day.  4. Losartan 50 mg p.o. daily.  5. Toprol 50 mg p.o. daily.  6. Omeprazole 20 mg p.o. daily.  7. Simvastatin 20 mg p.o. daily.  8. Venlafaxine 150 mg p.o. daily.   SOCIAL HISTORY: Lives at home with his friend. Occasionally smokes a cigar, but no cigarette smoking at all. Used to drink alcohol in the past, but has not drank in several years. No drug use.   FAMILY HISTORY: Significant for heart disease in the family.   REVIEW OF SYSTEMS:  CONSTITUTIONAL: Positive for fatigue, weakness. No fevers.  EYES: Positive for blurred vision, uses glasses. No glaucoma, inflammation, or cataracts.  ENT: No tinnitus, ear pain, hearing loss, epistaxis or discharge.  RESPIRATORY: No cough, wheeze, hemoptysis, or COPD. Positive for dyspnea.  CARDIOVASCULAR: No chest pain. Positive palpitations, dyspnea on exertion, orthopnea, and pedal edema. No syncope.  GASTROINTESTINAL: No nausea, vomiting, diarrhea, abdominal pain, hematemesis, or melena.  GENITOURINARY: No dysuria, hematuria, renal calculus, frequency, or incontinence.  ENDOCRINE: No polyuria, nocturia, thyroid problems, heat or cold intolerance.  HEMATOLOGY: No anemia, easy bruising or bleeding.  SKIN: No acne, rash or lesions.  MUSCULOSKELETAL: No neck fracture or pain, gout. Positive for arthritis, low back pain.  NEUROLOGIC: No numbness, weakness, CVA, TIA or seizures.  PSYCHOLOGIC: No anxiety, insomnia, depression.   PHYSICAL EXAMINATION:  VITAL SIGNS: Temperature 98.4 degrees Fahrenheit, pulse 89, respirations 18, blood pressure is 96/51,  and pulse oximetry 92% on room air.  GENERAL: Heavily built, well-nourished male, lying in bed, not in any acute distress.  HEENT: Normocephalic, atraumatic. Pupils pinpoint, round, equal, both eyes. Anicteric sclerae. Extraocular movements intact. Oropharynx: Clear without erythema, mass or exudates.  NECK: Supple. No thyromegaly, JVD or carotid bruits. No lymphadenopathy.  LUNGS: Moving air bilaterally. No wheeze or crackles. Decreased bibasilar breath sounds. No use of accessory muscles for breathing.  CARDIOVASCULAR: S1, S2, regular rate and rhythm, very loud, 4/6 systolic murmur heard in the aortic area, and a 3/6 murmur in the mitral area, radiating up to the carotids.  ABDOMEN: Soft, nontender, nondistended. No hepatosplenomegaly. Normal bowel sounds.  EXTREMITIES: Has 2+ edema up to the knees. Unable to palpate dorsalis pedis pulses. No clubbing or cyanosis.  SKIN: No acne, rash or lesions.  LYMPHATICS: No cervical lymphadenopathy.  NEUROLOGIC: Cranial nerves intact. No focal motor or sensory deficits.  PSYCHOLOGICAL: The patient is drowsy but arousable, oriented x3.   LABORATORY DATA: WBC 7.7, hemoglobin 11.4, hematocrit 34.7, platelet count 191,000.  Sodium 137, potassium 4.3, chloride 102, bicarbonate 31, BUN 17, creatinine 1.37, glucose 164, calcium of 8.8. ALT 21, AST 16, alkaline phosphatase 71, total bilirubin 0.6 and albumin of 3.6. Troponin 0.14. Lactic acid 1.2. INR is 1.0.   Chest x-ray showing cardiomegaly but no overt failure or edema. Negative for effusion. No pneumonia seen. Urinalysis negative for any infection. Urine toxicology screen positive for opioids.   CT of the head showing atrophy and chronic microvascular ischemic changes, but no acute abnormality.   EKG showing heart rate of 95, atrial fibrillation, right branch block. Occasional P waves noted. No acute ST-T wave abnormality.   ASSESSMENT AND PLAN: A 74 year old male with a history of diabetes, hypertension,  and chronic back pain on narcotics, admitted for falls, weakness and noted to have elevated troponin. 1.  Non- ST segment elevation myocardial infarction. We will monitor on telemetry. Denies any chest pain but has dyspnea on exertion for a long time. We will order an echocardiogram to rule out congestive heart failure or valve abnormality as the cause for his elevated troponin, which could be demand ischemia. We will recycle his troponins. Hold off on systemic anticoagulation at this time until the troponins are significantly  elevated. Cardiology has been consulted. Continue his aspirin and statin. Hold beta blocker as his blood pressure is now normal.  2.  Atrial fibrillation versus sinus arrhythmia. Metoprolol being held as blood pressure is low. Monitor on telemetry. P.r.n. metoprolol, IV push if needed. He is not a good candidate for long-term anticoagulation with his falls history. Cardiology has been consulted.  3.  Altered mental status, on narcotics as an outpatient. Improved with Narcan. We will need to verify his oxycodone doses. He was last seen by his pain physician, Dr. Tyler Weiss, on 03/13/2014 and has an outpatient follow-up appointment this month. Hold Neurontin as well for now.  4.  Hypertension. Hold losartan and metoprolol as blood pressure is low.  5.  Diabetes mellitus. Start on medications at home. Check A1c and start on sliding scale insulin here.  6.  Deep vein  thrombosis prophylaxis with Lovenox at this time.   CODE STATUS: Full code.   TIME SPENT ON ADMISSION: 50 minutes.   We will get a physical therapy consult and pain management consult for him.    ____________________________ Peter Baasadhika Clorine Swing, MD [id number] D: 03/19/2014 14:09:51 ET T: 03/19/2014 14:55:53 ET JOB#: [id number]  cc: Peter Baasadhika Bulah Lurie, MD, <Dictator> Peter BaasADHIKA Kashay Cavenaugh MD ELECTRONICALLY SIGNED 04/04/2014 10:42

## 2014-07-14 NOTE — Consult Note (Signed)
General Aspect Primary Cardiologist: New to [facility] _______________  74 year old male with history of HTN, HLD, DM2, mild cognitive impairment, GERD, and chronic back pain who presented to [facility] today after being dropped off by a friend 2/2 suffering increased falls and confusion at home.  ______________   PMH: 1. HTN 2. HLD 3. DM2 4. Mild cognitive impairment 5. GERD 6. Chronic back pain ______________   Present Illness 74 year old male with the above problem list who presented to [facility] via friend's car after suffering increased falls and confusion at home.  He denies any prior cardiac history. Previously lived in [place], [place] but recently relocated to [place]. Established with [facility] but was unfortunately dismissed from their practice. He is currently looking for a PCP. He reports for the past several months he has been experiencing increased falls at home (sometimes he only falls backwards into the chair he was trying to stand up from, others he falls all the way down to the ground). He has also noted an increase in his SOB and lower extremity swelling. This has limited his ability to perform daily activites. Of note, he has lost approximately 80-90 pounds over the past 1 year. He was on Lasix 40 mg daily at home, but had changed his regimen to either every other day or not at all 2/2 he did not want to void all of the time. He sometimes would wet himself. No orthopnea or early satiety, though he does sleep in a recliner (2/2 back pain). He reports over the past 2-3 months he has probably fallen at least 10 times. He denies taking any extra medication. No ETOH for at least 1 year. No illicit drugs.   Upon his arrival to the [facility] ED he was found to be in newly documented a-fib, rate controlled in the 90s-low 100s. Only prior office note I have from PCP is from 09/2013 which indicated normal rate and rhythm. No EKG was done at that OV. UDS positive for opiates. Troponin  0.14-->0.12. SCr 1.37. On telemetry he remains in rate controlled a-fib. CT of the head showed no acute abnormality. CXR showed cardiomegaly, no acute process. He is currently complaining of a posterior left sided headache, but otherwise feels well.   Physical Exam:  GEN well developed, well nourished, no acute distress, obese   HEENT hearing intact to voice, moist oral mucosa   NECK supple  no JVD   RESP normal resp effort  clear BS   CARD Irregular rate and rhythm  Murmur   Murmur Systolic  3/6 RUSB radiating to the neck, 2/6 apex radiating to the axilla   ABD denies tenderness  soft   EXTR 2+ pitting pedal edema   SKIN normal to palpation   NEURO cranial nerves intact   PSYCH alert   Review of Systems:  Subjective/Chief Complaint BALANCE PROBLEM   General: Weight loss or gain  Fatigue  Weakness   Skin: No Complaints   ENT: No Complaints   Eyes: No Complaints   Neck: No Complaints   Respiratory: Short of breath   Cardiovascular: Dyspnea   Gastrointestinal: No Complaints   Genitourinary: No Complaints   Vascular: No Complaints   Musculoskeletal: No Complaints   Neurologic: Dizzness  frequent falls   Hematologic: No Complaints   Endocrine: No Complaints   Psychiatric: No Complaints   Review of Systems: All other systems were reviewed and found to be negative   Medications/Allergies Reviewed Medications/Allergies reviewed  Family & Social History:  Family and Social History:  Family History mother: CAD, CA, DM ETOH abuse; father: CAD, CA, DM, ETOH abuse   Social History negative tobacco, negative ETOH, negative Illicit drugs   Place of Living Home     Heart Murmer:    Diabetes Mellitus, Type II (NIDD):    Chronic Back Pain:    disk:    hypertension:    brain tumor:          Admit Diagnosis:   NSTEMI INITIAL EPISODE OF CARE: Onset Date: 19-Mar-2014, Status: Active, Description: NSTEMI INITIAL EPISODE OF CARE  Home  Medications: Medication Instructions Status  metoprolol succinate 50 mg oral tablet, extended release 1 tab(s) orally once a day Active  simvastatin 20 mg oral tablet 1 tab(s) orally once a day (at bedtime) Active  venlafaxine 150 mg oral capsule, extended release 1 cap(s) orally once a day Active  furosemide 40 mg oral tablet 1 tab(s) orally once a day Active  donepezil 10 mg oral tablet 1 tab(s) orally once a day (at bedtime) Active  gabapentin 400 mg oral capsule 1 cap(s) orally 3 times a day Active  losartan 50 mg oral tablet 1 tab(s) orally once a day Active  omeprazole 20 mg oral delayed release capsule 1 cap(s) orally once a day Active   Lab Results:  Thyroid:  05-Jan-16 11:15   Thyroid Stimulating Hormone 1.27 (0.45-4.50 (IU = International Unit)  ----------------------- Pregnant patients have  different reference  ranges for TSH:  - - - - - - - - - -  Pregnant, first trimetser:  0.36 - 2.50 uIU/mL)  Hepatic:  05-Jan-16 11:15   Bilirubin, Total 0.6  Alkaline Phosphatase 71 (46-116 NOTE: New Reference Range 10/02/13)  SGPT (ALT) 21 (14-63 NOTE: New Reference Range 10/02/13)  SGOT (AST) 16  Total Protein, Serum 7.0  Albumin, Serum 3.6  Routine Chem:  05-Jan-16 11:15   Result Comment Troponin - RESULTS VERIFIED BY REPEAT TESTING.  - Elevated troponin called to and read  - back by Michelene Gardener in ER @ 1211  - 03/19/14 by BGB.  Result(s) reported on 19 Mar 2014 at 12:16PM.  Magnesium, Serum 1.8 (1.8-2.4 THERAPEUTIC RANGE: 4-7 mg/dL TOXIC: > 10 mg/dL  -----------------------)  Hemoglobin A1c ([facility])  6.7 (The American Diabetes Association recommends that a primary goal of therapy should be <7% and that physicians should reevaluate the treatment regimen in patients with HbA1c values consistently >8%.)  Cholesterol, Serum 164  Triglycerides, Serum 77  HDL (INHOUSE) 55  VLDL Cholesterol Calculated 15  LDL Cholesterol Calculated 94 (Result(s) reported on 19 Mar 2014 at  02:22PM.)  Glucose, Serum  164  BUN 17  Creatinine (comp)  1.37  Sodium, Serum 137  Potassium, Serum 4.3  Chloride, Serum 102  CO2, Serum 31  Calcium (Total), Serum 8.8  Osmolality (calc) 279  eGFR (African American) >60  eGFR (Non-African American)  54 (eGFR values <58m/min/1.73 m2 may be an indication of chronic kidney disease (CKD). Calculated eGFR, using the MRDR Study equation, is useful in  patients with stable renal function. The eGFR calculation will not be reliable in acutely ill patients when serum creatinine is changing rapidly. It is not useful in patients on dialysis. The eGFR calculation may not be applicable to patients at the low and high extremes of body sizes, pregnant women, and vegetarians.)  Anion Gap  4  Urine Drugs:  [id number]  Tricyclic Antidepressant, Ur Qual (comp) NEGATIVE (Result(s) reported on 19 Mar 2014 at 12:35PM.)  Amphetamines, Urine Qual. NEGATIVE  MDMA, Urine Qual. NEGATIVE  Cocaine Metabolite, Urine Qual. NEGATIVE  Opiate, Urine qual POSITIVE  Phencyclidine, Urine Qual. NEGATIVE  Cannabinoid, Urine Qual. NEGATIVE  Barbiturates, Urine Qual. NEGATIVE  Benzodiazepine, Urine Qual. NEGATIVE (----------------- The URINE DRUG SCREEN provides only a preliminary, unconfirmed analytical test result and should not be used for non-medical  purposes.  Clinical consideration and professional judgment should be  applied to any positive drug screen result due to possible interfering substances.  A more specific alternate chemical method must be used in order to obtain a confirmed analytical result.  Gas chromatography/mass spectrometry (GC/MS) is the preferred confirmatory method.)  Methadone, Urine Qual. NEGATIVE  Cardiac:  05-Jan-16 11:15   CK, Total 163 (Result(s) reported on 19 Mar 2014 at 02:29PM.)  CPK-MB, Serum 2.2 (Result(s) reported on 19 Mar 2014 at 02:29PM.)  Troponin I  0.14 (0.00-0.05 0.05 ng/mL or less: NEGATIVE  Repeat testing  in 3-6 hrs  if clinically indicated. >0.05 ng/mL: POTENTIAL  MYOCARDIAL INJURY. Repeat  testing in 3-6 hrs if  clinically indicated. NOTE: An increase or decrease  of 30% or more on serial  testing suggests a  clinically important change)    14:31   Troponin I  0.12 (0.00-0.05 0.05 ng/mL or less: NEGATIVE  Repeat testing in 3-6 hrs  if clinically indicated. >0.05 ng/mL: POTENTIAL  MYOCARDIAL INJURY. Repeat  testing in 3-6 hrs if  clinically indicated. NOTE: An increase or decrease  of 30% or more on serial  testing suggests a  clinically important change)  Routine UA:  05-Jan-16 12:00   Color (UA) Yellow  Clarity (UA) Clear  Glucose (UA) Negative  Bilirubin (UA) Negative  Ketones (UA) Negative  Specific Gravity (UA) 1.009  Blood (UA) 1+  pH (UA) 5.0  Protein (UA) Negative  Nitrite (UA) Negative  Leukocyte Esterase (UA) Negative (Result(s) reported on 19 Mar 2014 at 12:39PM.)  RBC (UA) 1 /HPF  WBC (UA) <1 /HPF  Bacteria (UA) NONE SEEN  Epithelial Cells (UA) <1 /HPF  Mucous (UA) PRESENT  Hyaline Cast (UA) 17 /LPF (Result(s) reported on 19 Mar 2014 at 12:39PM.)  Routine Coag:  05-Jan-16 11:15   Prothrombin 12.9  INR 1.0 (INR reference interval applies to patients on anticoagulant therapy. A single INR therapeutic range for coumarins is not optimal for all indications; however, the suggested range for most indications is 2.0 - 3.0. Exceptions to the INR Reference Range may include: Prosthetic heart valves, acute myocardial infarction, prevention of myocardial infarction, and combinations of aspirin and anticoagulant. The need for a higher or lower target INR must be assessed individually. Reference: The Pharmacology and Management of the Vitamin K  antagonists: the seventh ACCP Conference on Antithrombotic and Thrombolytic Therapy. BHALP.3790 Sept:126 (3suppl): N9146842. A HCT value >55% may artifactually increase the PT.  In one study,  the increase was an  average of 25%. Reference:  "Effect on Routine and Special Coagulation Testing Values of Citrate Anticoagulant Adjustment in Patients with High HCT Values." American Journal of Clinical Pathology 2006;126:400-405.)  Routine Hem:  05-Jan-16 11:15   WBC (CBC) 7.7  RBC (CBC)  3.76  Hemoglobin (CBC)  11.4  Hematocrit (CBC)  34.7  Platelet Count (CBC) 191 (Result(s) reported on 19 Mar 2014 at 11:30AM.)  MCV 92  MCH 30.3  MCHC 32.9  RDW  14.9   EKG:  EKG Interp. by me   Interpretation EKG shows a-fib, 95 bpm, RBBB, TWI III   Radiology Results:  XRay:    05-Jan-16 11:14, Chest Portable Single View  Chest Portable Single View   REASON FOR EXAM:    Altered Mental Status  COMMENTS:       PROCEDURE: DXR - DXR PORTABLE CHEST SINGLE VIEW  - Mar 19 2014 11:14AM     CLINICAL DATA:  Altered mental status    EXAM:  PORTABLE CHEST - 1 VIEW    COMPARISON:  09/12/2013    FINDINGS:  Cardiac enlargement without heart failure or edema. Negative for  effusion. Negative for pneumonia.   IMPRESSION:  No active disease.      Electronically Signed    By: Franchot Gallo M.D.    On: 03/19/2014 11:34         Verified By: Truett Perna, M.D.,  CT:    05-Jan-16 12:51, CT Head Without Contrast  CT Head Without Contrast   REASON FOR EXAM:    ams  COMMENTS:   May transport without cardiac monitor    PROCEDURE: CT  - CT HEAD WITHOUT CONTRAST  - Mar 19 2014 12:51PM     CLINICAL DATA:  Altered mental status    EXAM:  CT HEAD WITHOUT CONTRAST    TECHNIQUE:  Contiguous axial images were obtained from the base of the skull  through the vertex without intravenous contrast.    COMPARISON:  CT head 09/12/2013  FINDINGS:  Generalized atrophy. Mild chronic microvascular ischemic change.  Negative for acute infarct. Negative for hemorrhage or mass.    Calvarium intact. Gas in facial veins bilaterally related to IV  placement.     IMPRESSION:  Atrophy and chronic microvascular ischemia.   No acute abnormality.      Electronically Signed    By: Franchot Gallo M.D.    On: 03/19/2014 13:23     Verified By: Truett Perna, M.D.,    No Known Allergies:   Vital Signs/Nurse's Notes: **Vital Signs.:   05-Jan-16 14:51  Vital Signs Type Admission  Temperature Temperature (F) 98.2  Celsius 36.7  Pulse Pulse 78  Respirations Respirations 20  Systolic BP Systolic BP 373  Diastolic BP (mmHg) Diastolic BP (mmHg) 74  Mean BP 87  Pulse Ox % Pulse Ox % 97  Pulse Ox Activity Level  At rest  Oxygen Delivery Room Air/ 21 %    Impression 74 year old male with history of HTN, HLD, DM2, mild cognitive impairment, GERD, and chronic back pain who presented to [facility] today after being dropped off by a friend 2/2 suffering increased falls and confusion at home. Found to have rate controlled a-fib upon presentation and downward trending troponin.   1. Elevated troponin: -Possibly in the setting of a-fib vs volume overload -Downward trending troponin 0.14-->0.12, await final -Hold heparin gtt at this time given the downward trending nature of his troponin, frequent fall history, and unsteady gait  -In the setting of his frequent falls he may not be the best candidate for DAPT in the future -  consider medical management  2. A-fib: -Uncertain of the chronicity at this time -Rate controlled -CHADSVASc at least 3 giving him an estimated annual risk of 3.2% of stroke (echo pending) -Patient with at least 10 falls recently - risk of anticoagulation is significant, hold at this time, not a good long term AC candidate 2/2 the above  3. SOB/lower extremity edema: -Has not been taking his Lasix at home  4. Aortic stenosis/mitral regurge: -Echo pending -Possibly leading to the above  5.  AMS: -Continue to follow on telemetry -Could pursue outpatient cardiac monitoring -IM is following  6. HTN: -Presented hypotensive -BP has stablized  7. DM2   Electronic Signatures: Rise Mu  (PA-C)  (Signed 05-Jan-16 16:49)  Authored: General Aspect/Present Illness, History and Physical Exam, Review of System, Family & Social History, Past Medical History, Home Medications, Labs, EKG , Radiology, Allergies, Vital Signs/Nurse's Notes, Impression/Plan Ida Rogue (MD)  (Signed 05-Jan-16 18:59)  Authored: General Aspect/Present Illness, History and Physical Exam, Review of System, Health Issues, Labs, EKG , Impression/Plan  Co-Signer: General Aspect/Present Illness, History and Physical Exam, Review of System, Family & Social History, Past Medical History, Home Medications, Labs, EKG , Radiology, Allergies, Vital Signs/Nurse's Notes, Impression/Plan   Last Updated: 05-Jan-16 18:59 by Ida Rogue (MD)

## 2014-07-14 NOTE — H&P (Signed)
PATIENT NAME:  Peter Weiss, Peter Weiss MR#:  [id number] DATE OF BIRTH:  Dec 05, 1940  DATE OF ADMISSION:  03/21/2014  PRIMARY CARE PHYSICIAN:  Charlesetta Garibaldi, MD   EMERGENCY ROOM PHYSICIAN:  Onnie Boer, MD   CHIEF COMPLAINT: Dizziness and hypotension.   HISTORY OF PRESENT ILLNESS: A 74 year old male patient who was discharged from hospital yesterday after signing out AMA, comes in today because patient's blood pressure was low at primary doctor's office and he was sent in here for further evaluation. The patient was admitted on January 5,  he signed out AMA on 6th.He has severe A S   and patient supposed to have a cardiac catheterization and possible valve surgery .Marland Kitchen The patient came today because of hypotension. I spoke with Dr. Mariah Milling, he suggested to have angiogram for him and the patient decided to stay and have a cardiac catheterization tomorrow, he is admitted for that.   He denies chest pain, trouble breathing. He complains of dizziness, more so when he stands up; denies trouble breathing, denies chest pain. The patient denies any syncopal episodes, does have pedal edema.   PAST MEDICAL HISTORY: Significant for history of degenerative joint disease and low back pain, hypertension, type 2 diabetes mellitus.   PAST SURGICAL HISTORY: The patient has a mass resected from the neck, patient not sure what it is.   ALLERGIES: No known allergies.   SOCIAL HISTORY: The patient occasionally smokes a cigar, but no alcohol; the patient does not do drugs.   FAMILY HISTORY: Significant for heart disease in the family.   REVIEW OF SYSTEMS:   CONSTITUTIONAL: Has some fatigue, but no fever.  EYES: No blurred vision, glaucoma.  ENT: No tinnitus. No ear pain.  No epistaxis. No hearing loss.   RESPIRATORY: No cough, no wheezing, patient has dyspnea.  CARDIOVASCULAR: No chest pain. No palpitations. The patient has some pedal edema. No orthopnea.  ABDOMEN: No nausea, no vomiting, no diarrhea.   GENITOURINARY: No dysuria or hematuria.  HEMATOLOGICAL: No anemia or easy bruising.  MUSCULOSKELETAL: The patient has low back pain.  NEUROLOGIC: No numbness or weakness.  PSYCHIATRIC: No anxiety or insomnia.   MEDICATIONS: Aricept 10 mg p.o. daily,  losartan 50 mg p.o. daily, metoprolol succinate 50 mg p.o. daily, omeprazole 20 mg daily, simvastatin 20 mg daily, venlafaxine 150 mg daily.   PHYSICAL EXAMINATION: VITAL SIGNS: Temperature 98 Fahrenheit, heart rate 78, blood pressure 100/60with (> 94% on room air.  GENERAL: Patient is awake, oriented, well-developed, well-nourished, alert male not in distress.  HEAD: Normocephalic, atraumatic.  EYES: Pupils equal reacting to light, extraocular movements intact.  NOSE: no turbinate hypertrpophy, MOUTH:  no oropharyngeal erythema,.   NECK: Supple, symmetric. No masses. Thyroid midline,  thyroid not  enlarged. No JVD. Neck is nontender, normal range of motion.  RESPIRATORY: Bilateral breath, basilar crepitations present,not using accessory muscles of respiration.  CARDIOVASCULAR: Patient is irregularly irregular rhythm and murmur. Patient has ejection systolic murmur 3/6, in aortic area, and radiating to the axilla and carotids.  ABDOMEN: Soft, nontender, nondistended. Bowel sounds present. No hernias.  EXTREMITIES: Have 2+ pitting pedal edema up to the knees bilaterally.  SKIN: Inspection is normal.  MUSCULOSKELETAL: Able to move extremities x 4, normal range of motion, strength and tone are equal bilaterally.  LYMPHATICS: No lymphadenopathy.  SKIN: Inspection is normal, well-hydrated, no diaphoresis.  NEUROLOGIC: Cranial nerves II through XII are intact, power 5/5 in upper and lower extremities. Sensation is intact. DTRs are 2+ bilateral.  PSYCHIATRIC: Mood  and affect are within normal limits. Judgment and memory are intact.   DIAGNOSTIC DATA: Electrolytes sodium is 137, potassium is 4, chloride 102, bicarbonate 27, BUN 26, creatinine 1.46,  glucose 158, CBC, WBC 6.6, hemoglobin 9.9, hematocrit 31.2, platelets 164,000. Chest x-ray shows no active cardiopulmonary disease; troponin 0.08; EKG showsNSR rhythm with PACs, 81 beats per minute; the patient has ST wave inversions in V1, V2, and flat T waves in V3, V4.   ASSESSMENT AND PLAN: 751.  A 74 year old male with severe aortic stenosis with some dizziness, comes in because of hypotension episode at primary doctor's office, patient's blood pressure not documented in the (triage., . So, we are admitting the patient for symptomatic aortic stenosis and we will continue beta blockers, Lasix, but hold losartan. and patient will be monitored on telemetry, the patient had echocardiogram done during last hospitalization and his echocardiogram done on 6th, that was yesterday showed ejection fraction of 55%, with normal LV function and patient has severe aortic stenosis with gradient 7 to 6 mm, which was peak gradient,  and mean gradient 53 mm. The patient needs angiogram and he is going for cardiac catheterization tomorrow. I spoke with Dr. Julien Nordmannimothy Gollan and he is planning to do cardiac catheterization tomorrow to evaluate coronary arteries and also aortic valve and other valves. He may need it aortic valve replacement soon.    2.  Acute renal insufficiency. The patient's renal function was normal. Today his BUN and  creatinine are elevated. Patient losartan is stopped  and Lasix is continued and he is on 40 mg daily, and we will continue that and monitor his kidney function closely.  3.  Atrial fibrillation. Rate is controlled. He is not a candidate for anticoagulation due to history of falls. The patient is on metoprolol, we will continue that.  4.  Dementia. Continue Aricept 10 mg daily.  5.  Depression. Continue venlafaxine.   TIME SPENT: Was 55 minutes.    ____________________________ Katha HammingSnehalatha Bernis Stecher, MD sk:nt D: 03/21/2014 17:19:04 ET T: 03/21/2014 18:40:47  ET JOB#: [id number]  cc: Katha HammingSnehalatha Evetta Renner, MD, <Dictator> Katha HammingSNEHALATHA Nathalia Wismer MD ELECTRONICALLY SIGNED 04/17/2014 16:06

## 2014-07-14 NOTE — Discharge Summary (Signed)
PATIENT NAME:  Peter Weiss, [name] MR#:  [id number] DATE OF BIRTH:  [birth year]  DATE OF ADMISSION:  03/21/2014 DATE OF DISCHARGE:  03/22/2014  For a detailed note, please take a look at the history and physical done on admission by Dr. Luberta MutterKonidena.   DISCHARGE DIAGNOSES:  1.  Hypotension, likely due to dehydration. 2.  Severe aortic stenosis. 3.  Hypertension. 4.  Depression.  5.  Dementia.   DISCHARGE DIET: The patient is being discharged on a low-sodium, low-fat diet.   DISCHARGE ACTIVITY: As tolerated.  DISCHARGE FOLLOWUP: Follow up with Dr. Ellsworth Lennoxejan-Sie in the next 1 to 2 weeks. This is a new patient for Dr. Ellsworth Lennoxejan-Sie.   DISCHARGE MEDICATIONS: Losartan 25 mg daily, gabapentin 400 mg t.i.d., Effexor 150 mg daily, simvastatin 20 mg at bedtime, Toprol 50 mg daily, Aricept 10 mg daily, omeprazole 20 mg daily, Lasix 20 mg daily.   CONSULTANTS DURING THE HOSPITAL COURSE: Dr. Julien Nordmannimothy Gollan from cardiology.   PERTINENT STUDIES DONE DURING THE HOSPITAL COURSE: A chest x-ray done on admission showed no acute cardiopulmonary disease.   A cardiac catheterization done showed mild proximal LAD disease, no evidence of any significant coronary artery disease.  HOSPITAL COURSE: This is a 74 year old male who recently left the hospital against medical advice, returns to the hospital as he was noted to be mildly hypotensive.  1.  Hypotension. The patient apparently has not been hypotensive while in the hospital. This was thought to be possibly related to polypharmacy versus dehydration. He was given some gentle IV fluids and his hemodynamics have improved. There was no evidence of any acute infection or any evidence of sepsis. His hypotension has now resolved. I have lowered his losartan and Lasix dose.  2.  Severe aortic stenosis. This was noticed on the echocardiogram on his previous hospitalization. He underwent cardiac catheterization to rule out any evidence of coronary artery disease, which was negative.  He likely would benefit from aortic valve replacement, but this is to be done as an outpatient and Dr. Mariah MillingGollan is going to [place] to [place]. For now we will continue medical management with beta blockers as mentioned. Clinically, the patient is not in congestive heart failure presently.  3.  Hypertension. The patient will continue his Toprol and low-dose losartan and Lasix as stated.   4.  Depression. The patient will continue his Effexor.  5.  Dementia. The patient was maintained on Aricept. He will continue that.  6.  Hyperlipidemia. The patient was maintained on simvastatin. He will also resume that.   DISCHARGE INSTRUCTIONS: The patient is being discharged home with home health nursing services along with physical therapy, social work, nurse's aide and psych nursing.  TIME SPENT: 40 minutes.   ____________________________ Rolly PancakeVivek J. Cherlynn KaiserSainani, MD vjs:sb D: 03/22/2014 16:20:32 ET T: 03/22/2014 16:44:01 ET JOB#: [id number]  cc: Rolly PancakeVivek J. Cherlynn KaiserSainani, MD, <Dictator> Sheikh A. Ellsworth Lennoxejan-Sie, MD Houston SirenVIVEK J Elby Blackwelder MD ELECTRONICALLY SIGNED 04/09/2014 10:40

## 2014-07-15 ENCOUNTER — Telehealth: Payer: Self-pay

## 2014-07-15 NOTE — Telephone Encounter (Signed)
Nurse called patient, reached voicemail. Left message for patient to call Aariana Shankland at [phone].   

## 2014-07-15 NOTE — Telephone Encounter (Signed)
-----   Message from Enobong Amao, MD sent at 07/14/2014  1:24 AM EDT ----- Anemia has improved slightly; please inform him to notify the clinic if he still has dark tarry stools. 

## 2014-07-18 NOTE — Telephone Encounter (Signed)
Nurse called patient, someone answered phone. Nurse asked for [employer]. Person answering wanted to know who was calling, nurse told them "Peter Weiss". Person asked again "Who's calling", Nurse answered "Peter Weiss". Person said "goodbye" and hung up before nurse could finish speaking.

## 2014-07-19 NOTE — Telephone Encounter (Signed)
Left message for Peter Weiss to call [facility] to set up orientation appointment.

## 2014-07-24 ENCOUNTER — Telehealth: Payer: Self-pay

## 2014-07-24 NOTE — Telephone Encounter (Signed)
Nurse called Peter Weiss, Peter Weiss verified date of birth. Peter Weiss aware of improved anemia. Peter Weiss [facility] to call nurse, Herbert SetaHeather, if he notices any dark, tarry bowel movements. He said he hasn't looked at his bowel movement lately. Nurse encouraged Peter Weiss to look at bowel movements and monitor tham for dark, tarry stool. Peter Weiss voices understanding.

## 2014-07-24 NOTE — Telephone Encounter (Signed)
-----   Message from Jaclyn ShaggyEnobong Amao, MD sent at 07/14/2014  1:24 AM EDT ----- Anemia has improved slightly; please inform him to notify the clinic if he still has dark tarry stools.

## 2014-07-25 ENCOUNTER — Other Ambulatory Visit: Payer: Self-pay | Admitting: *Deleted

## 2014-07-26 ENCOUNTER — Other Ambulatory Visit: Payer: Self-pay | Admitting: *Deleted

## 2014-07-26 ENCOUNTER — Telehealth: Payer: Self-pay | Admitting: *Deleted

## 2014-07-26 DIAGNOSIS — E785 Hyperlipidemia, unspecified: Secondary | ICD-10-CM

## 2014-07-26 MED ORDER — SIMVASTATIN 20 MG PO TABS: 20.0000 mg | ORAL_TABLET | Freq: Every day | ORAL | Status: DC

## 2014-07-26 NOTE — Telephone Encounter (Signed)
°  1. Which medications need to be refilled? Sertraline, Tramadol, Donepezil , Simvastatin, OxyCODONE  2. Which [facility] [place]   3. Do they need a 30 day or 90 day supply? 30   4. Would they like a call back once the medication has been sent to the pharmacy? No   Pt will call back with rest of medication he needs call in.

## 2014-07-26 NOTE — Telephone Encounter (Signed)
Pt is also for Flomax

## 2014-07-26 NOTE — Telephone Encounter (Signed)
We can not refill any primary prescribed medications. I sent refill for simvastatin with 1 refill until pt is able to be seen by Dr. Mariah MillingGollan. Pt will have to contact pcp for other medication refills.

## 2014-07-29 ENCOUNTER — Ambulatory Visit: Payer: Medicare Other | Admitting: Nurse Practitioner

## 2014-07-31 ENCOUNTER — Ambulatory Visit (INDEPENDENT_AMBULATORY_CARE_PROVIDER_SITE_OTHER): Payer: Medicare Other | Admitting: Cardiovascular Disease

## 2014-07-31 ENCOUNTER — Encounter: Payer: Self-pay | Admitting: Cardiovascular Disease

## 2014-07-31 VITALS — BP 120/80 | HR 120 | Ht 69.0 in | Wt 244.5 lb

## 2014-07-31 DIAGNOSIS — I4891 Unspecified atrial fibrillation: Secondary | ICD-10-CM

## 2014-07-31 DIAGNOSIS — I714 Abdominal aortic aneurysm, without rupture, unspecified: Secondary | ICD-10-CM

## 2014-07-31 DIAGNOSIS — I350 Nonrheumatic aortic (valve) stenosis: Secondary | ICD-10-CM

## 2014-07-31 DIAGNOSIS — I10 Essential (primary) hypertension: Secondary | ICD-10-CM

## 2014-07-31 DIAGNOSIS — G3184 Mild cognitive impairment, so stated: Secondary | ICD-10-CM

## 2014-07-31 DIAGNOSIS — F4323 Adjustment disorder with mixed anxiety and depressed mood: Secondary | ICD-10-CM

## 2014-07-31 DIAGNOSIS — R0602 Shortness of breath: Secondary | ICD-10-CM

## 2014-07-31 MED ORDER — TAMSULOSIN HCL 0.4 MG PO CAPS: 0.4000 mg | ORAL_CAPSULE | Freq: Every day | ORAL | Status: DC

## 2014-07-31 MED ORDER — SERTRALINE HCL 50 MG PO TABS: 50.0000 mg | ORAL_TABLET | Freq: Every day | ORAL | Status: DC

## 2014-07-31 MED ORDER — OMEPRAZOLE 20 MG PO CPDR: 20.0000 mg | DELAYED_RELEASE_CAPSULE | Freq: Every day | ORAL | Status: DC

## 2014-07-31 MED ORDER — DONEPEZIL HCL 10 MG PO TABS: 10.0000 mg | ORAL_TABLET | Freq: Every day | ORAL | Status: DC

## 2014-07-31 MED ORDER — FUROSEMIDE 20 MG PO TABS: 20.0000 mg | ORAL_TABLET | Freq: Every day | ORAL | Status: DC

## 2014-07-31 MED ORDER — METOPROLOL TARTRATE 50 MG PO TABS: 50.0000 mg | ORAL_TABLET | Freq: Two times a day (BID) | ORAL | Status: DC

## 2014-07-31 MED ORDER — VENLAFAXINE HCL ER 150 MG PO TB24: 150.0000 mg | ORAL_TABLET | Freq: Every day | ORAL | Status: DC

## 2014-07-31 NOTE — Assessment & Plan Note (Signed)
We will increase his metoprolol for heart rate control. Otherwise no other blood pressure medications needed

## 2014-07-31 NOTE — Assessment & Plan Note (Signed)
Estimated greater than 4 cm, will need periodic monitoring with ultrasound

## 2014-07-31 NOTE — Assessment & Plan Note (Signed)
[place]. We will perform echocardiogram in one year

## 2014-07-31 NOTE — Progress Notes (Signed)
Patient ID: Peter Weiss, male    DOB: 06-Apr-1940, 74 y.o.   MRN: [id number]  HPI Comments: Peter Weiss is a 74 year old gentleman with history of severe aortic valve stenosis, atrial fibrillation, GI bleed who presents to establish care in the [place] office.  We first met him at [facility] after he was admitted for atrial fibrillation, shortness of breath. Workup at that time confirmed aortic valve stenosis.  Since then he has had several long hospital courses at [facility] for aortic valve replacement with bioprosthetic valve, Follow-up hospitalization for GI bleeding after he was on anticoagulation for atrial fibrillation.  He presents today and reports that he is out of all of his medications. He does not have primary care. It appears he was discharged from his previous primary care physician office. Unclear if this is for medication noncompliance or seeking pain medication. He has requested pain medication several times on today's visit and we have made it clear to him that we do not do pain medication through the cardiac office.  In his bag of pills, he has old prescription bottles for prescriptions that he is not taking. These are empty. He does have several pills of simvastatin remaining, several gabapentin remaining. He would like refills of all of his medications as he does not have primary care  EKG on today's visit shows atrial fibrillation with heart rate 120 bpm, right bundle branch block   No Known Allergies  No current outpatient prescriptions on file prior to visit.   No current facility-administered medications on file prior to visit.    Past Medical History  Diagnosis Date  . Arthritis   . Chicken pox   . Heart murmur   . Hypertension   . Hyperlipidemia   . Urinary incontinence   . History of fainting spells of unknown cause   . Diabetes mellitus without complication   . Depression   . Aortic stenosis     Past Surgical History  Procedure Laterality Date  .  Goiter  2008  . Multiple extractions with alveoloplasty N/A 04/24/2014    Procedure: Extraction of tooth #'s 2,3,5,12,13,14,15,20 with allveoloplasty and gross debridement of remaining dentition and sectioning of bridge at mesial of abutment tooth #32.;  Surgeon: Lenn Cal, DDS;  Location: [place];  Service: Oral Surgery;  Laterality: N/A;  . Aortic valve replacement N/A 04/26/2014    Procedure: AORTIC VALVE REPLACEMENT (AVR);  Surgeon: Melrose Nakayama, MD;  Location: [place];  Service: Open Heart Surgery;  Laterality: N/A;  . Maze N/A 04/26/2014    Procedure: MAZE;  Surgeon: Melrose Nakayama, MD;  Location: [place];  Service: Open Heart Surgery;  Laterality: N/A;  . Tee without cardioversion N/A 04/26/2014    Procedure: TRANSESOPHAGEAL ECHOCARDIOGRAM (TEE);  Surgeon: Melrose Nakayama, MD;  Location: [place];  Service: Open Heart Surgery;  Laterality: N/A;  . Esophagogastroduodenoscopy N/A 07/04/2014    Procedure: ESOPHAGOGASTRODUODENOSCOPY (EGD);  Surgeon: Carol Ada, MD;  Location: [facility] ENDOSCOPY;  Service: Endoscopy;  Laterality: N/A;  . Cardiac catheterization  03/2014    Social History  reports that he has never smoked. He has never used smokeless tobacco. He reports that he does not drink alcohol or use illicit drugs.  Family History family history includes Alcohol abuse in his brother, father, and mother; Cancer in his father and mother; Diabetes in his father and mother; Heart disease in his father and mother.   Review of Systems  Constitutional: Negative.   Respiratory: Negative.  Cardiovascular: Negative.   Gastrointestinal: Negative.   Musculoskeletal: Negative.   Skin: Negative.   Neurological: Negative.   Hematological: Negative.   Psychiatric/Behavioral: Negative.   All other systems reviewed and are negative.   BP 120/80 mmHg  Pulse 120  Ht 5' 9"  (1.753 m)  Wt 244 lb 8 oz (110.904 kg)  BMI 36.09 kg/m2   Physical Exam  Constitutional: He is oriented to  person, place, and time. He appears well-developed and well-nourished.  HENT:  Head: Normocephalic.  Nose: Nose normal.  Mouth/Throat: Oropharynx is clear and moist.  Eyes: Conjunctivae are normal. Pupils are equal, round, and reactive to light.  Neck: Normal range of motion. Neck supple. No JVD present.  Cardiovascular: S1 normal, S2 normal, normal heart sounds and intact distal pulses.  An irregularly irregular rhythm present. Tachycardia present.  Exam reveals no gallop and no friction rub.   No murmur heard. Pulmonary/Chest: Effort normal and breath sounds normal. No respiratory distress. He has no wheezes. He has no rales. He exhibits no tenderness.  Abdominal: Soft. Bowel sounds are normal. He exhibits no distension. There is no tenderness.  Musculoskeletal: Normal range of motion. He exhibits no edema or tenderness.  Lymphadenopathy:    He has no cervical adenopathy.  Neurological: He is alert and oriented to person, place, and time. Coordination normal.  Skin: Skin is warm and dry. No rash noted. No erythema.  Psychiatric: He has a normal mood and affect. His behavior is normal. Judgment and thought content normal.      Assessment and Plan   Nursing note and vitals reviewed.

## 2014-07-31 NOTE — Assessment & Plan Note (Signed)
We have renewed his Aricept at his request until he is able to find a primary care physician

## 2014-07-31 NOTE — Assessment & Plan Note (Signed)
He had clipping of his appendage, maze procedure. High risk of her current bleeding. Will not restart anticoagulation

## 2014-07-31 NOTE — Assessment & Plan Note (Signed)
He reports that he does not have a therapist. We have renewed his venlafaxine, gabapentin, Aricept until he is able to find primary care. We have declined refilling his pain medication

## 2014-07-31 NOTE — Patient Instructions (Addendum)
You are doing well. No medication changes were made.  Please call us if you have new issues that need to be addressed before your next appt.  Your physician wants you to follow-up in: 6 months.  You will receive a reminder letter in the mail two months in advance. If you don't receive a letter, please call our office to schedule the follow-up appointment.  [facility] [address], [place] [address] [phone]  [facility] [address], [place] [address] [phone]  [facility] [address], [place] [address] [phone]

## 2014-08-30 ENCOUNTER — Telehealth: Payer: Self-pay

## 2014-08-30 NOTE — Telephone Encounter (Signed)
Pt requesting PA for Eliquis looking at last ov Eliquis was D/C as well D/C at hospital discharge.

## 2014-08-30 NOTE — Telephone Encounter (Signed)
Would recommend he not restart eliquis at this time He had clipping of his appendage, maze procedure which can help reduce his risk of stroke from atrial fibrillation. Given his GI bleed, not a good candidate for anticoagulation He needs primary care physician for renewal of his noncardiac medications

## 2014-08-30 NOTE — Telephone Encounter (Signed)
Spoke with pt and he mentioned that he was confused about medications and see's to many doctors. He did not realize that he was told to D/C Eliquis. He was requesting a substitute for Eliquis but I made him aware that he should not be taking due to risk of bleed. FYI     Atrial fibrillation, unspecified - Antonieta Iba, MD at 07/31/2014 5:47 PM     Status: Written Related Problem: Atrial fibrillation, unspecified   Expand All Collapse All   He had clipping of his appendage, maze procedure. High risk of her current bleeding. Will not restart anticoagulation

## 2014-08-30 NOTE — Telephone Encounter (Signed)
Spoke w/ pt.  Advised him of Dr. Windell Hummingbird recommendation.  He states that another doctor filled this rx at the first of the month, but he is unsure which one.  Advised him that Dr. Mariah Milling does not want him taking Eliquis and have his other MD call w/ any questions or concerns.

## 2014-08-30 NOTE — Telephone Encounter (Signed)
Patient calling to tell us he needs a prior auth form filled out for Eliquis please fax for patient .  Patient also [facility] a [facility] day [facility] of [facility] [place].

## 2014-08-30 NOTE — Telephone Encounter (Signed)
Pt states his insurance will not cover Eliquis Would like to change to something different. Please call.

## 2014-09-09 ENCOUNTER — Ambulatory Visit: Payer: Medicare Other | Attending: Pain Medicine | Admitting: Pain Medicine

## 2014-09-09 ENCOUNTER — Encounter: Payer: Self-pay | Admitting: Pain Medicine

## 2014-09-09 VITALS — BP 130/73 | HR 76 | Temp 98.2°F | Resp 16 | Ht 70.0 in | Wt 230.0 lb

## 2014-09-09 DIAGNOSIS — M5136 Other intervertebral disc degeneration, lumbar region: Secondary | ICD-10-CM | POA: Insufficient documentation

## 2014-09-09 DIAGNOSIS — E119 Type 2 diabetes mellitus without complications: Secondary | ICD-10-CM | POA: Diagnosis not present

## 2014-09-09 DIAGNOSIS — F0390 Unspecified dementia without behavioral disturbance: Secondary | ICD-10-CM | POA: Diagnosis not present

## 2014-09-09 DIAGNOSIS — E1340 Other specified diabetes mellitus with diabetic neuropathy, unspecified: Secondary | ICD-10-CM | POA: Insufficient documentation

## 2014-09-09 DIAGNOSIS — M545 Low back pain: Secondary | ICD-10-CM | POA: Diagnosis present

## 2014-09-09 DIAGNOSIS — M48062 Spinal stenosis, lumbar region with neurogenic claudication: Secondary | ICD-10-CM | POA: Insufficient documentation

## 2014-09-09 DIAGNOSIS — M47816 Spondylosis without myelopathy or radiculopathy, lumbar region: Secondary | ICD-10-CM

## 2014-09-09 DIAGNOSIS — F418 Other specified anxiety disorders: Secondary | ICD-10-CM | POA: Diagnosis not present

## 2014-09-09 DIAGNOSIS — M79605 Pain in left leg: Secondary | ICD-10-CM | POA: Diagnosis present

## 2014-09-09 DIAGNOSIS — R55 Syncope and collapse: Secondary | ICD-10-CM | POA: Insufficient documentation

## 2014-09-09 DIAGNOSIS — M79604 Pain in right leg: Secondary | ICD-10-CM | POA: Diagnosis present

## 2014-09-09 NOTE — Progress Notes (Signed)
Discharged to home at 1342pm teach back times 3 pt with [facility]  Given written information and  Given verbal instructions.  No meds

## 2014-09-09 NOTE — Patient Instructions (Addendum)
Continue present medications.  Lumbar facet, medial branch nerve, blocks to be performed at time return appointment we will need to have Dr.Hande hold aspirin for 5 days prior to your procedure  F/U PCP for evaliation of  BP and general medical  condition.  F/U surgical evaluation.  Schedule psych evaluation of depression. Patient also with history of dementia and other medical conditions  F/U neurological evaluation as discussed for further evaluation of dementia . We will schedule neurological evaluation of your dementia at this time  May consider radiofrequency rhizolysis or intraspinal procedures pending response to present treatment and F/U evaluation.  Patient to call [facility] should patient have concerns prior to scheduled return appointment. Stop aspirin for 5 days if ok with Dr. Marcello FennelHande . We will callyou .GENERAL RISKS AND COMPLICATIONS  What are the risk, side effects and possible complications? Generally speaking, most procedures are safe.  However, with any procedure there are risks, side effects, and the possibility of complications.  The risks and complications are dependent upon the sites that are lesioned, or the type of nerve block to be performed.  The closer the procedure is to the spine, the more serious the risks are.  Great care is taken when placing the radio frequency needles, block needles or lesioning probes, but sometimes complications can occur. 1. Infection: Any time there is an injection through the skin, there is a risk of infection.  This is why sterile conditions are used for these blocks.  There are four possible types of infection. 1. Localized skin infection. 2. Central Nervous System Infection-This can be in the form of Meningitis, which can be deadly. 3. Epidural Infections-This can be in the form of an epidural abscess, which can cause pressure inside of the spine, causing compression of the spinal cord with subsequent paralysis. This would  require an emergency surgery to decompress, and there are no guarantees that the patient would recover from the paralysis. 4. Discitis-This is an infection of the intervertebral discs.  It occurs in about 1% of discography procedures.  It is difficult to treat and it may lead to surgery.        2. Pain: the needles have to go through skin and soft tissues, will cause soreness.       3. Damage to internal structures:  The nerves to be lesioned may be near blood vessels or    other nerves which can be potentially damaged.       4. Bleeding: Bleeding is more common if the patient is taking blood thinners such as  aspirin, Coumadin, Ticiid, Plavix, etc., or if he/she have some genetic predisposition  such as hemophilia. Bleeding into the spinal canal can cause compression of the spinal  cord with subsequent paralysis.  This would require an emergency surgery to  decompress and there are no guarantees that the patient would recover from the  paralysis.       5. Pneumothorax:  Puncturing of a lung is a possibility, every time a needle is introduced in  the area of the chest or upper back.  Pneumothorax refers to free air around the  collapsed lung(s), inside of the thoracic cavity (chest cavity).  Another two possible  complications related to a similar event would include: Hemothorax and Chylothorax.   These are variations of the Pneumothorax, where instead of air around the collapsed  lung(s), you may have blood or chyle, respectively.       6. Spinal headaches: They may occur with  any procedures in the area of the spine.       7. Persistent CSF (Cerebro-Spinal Fluid) leakage: This is a rare problem, but may occur  with prolonged intrathecal or epidural catheters either due to the formation of a fistulous  track or a dural tear.       8. Nerve damage: By working so close to the spinal cord, there is always a possibility of  nerve damage, which could be as serious as a permanent spinal cord injury with   paralysis.       9. Death:  Although rare, severe deadly allergic reactions known as "Anaphylactic  reaction" can occur to any of the medications used.      10. Worsening of the symptoms:  We can always make thing worse.  What are the chances of something like this happening? Chances of any of this occuring are extremely low.  By statistics, you have more of a chance of getting killed in a motor vehicle accident: while driving to the hospital than any of the above occurring .  Nevertheless, you should be aware that they are possibilities.  In general, it is similar to taking a shower.  Everybody knows that you can slip, hit your head and get killed.  Does that mean that you should not shower again?  Nevertheless always keep in mind that statistics do not mean anything if you happen to be on the wrong side of them.  Even if a procedure has a 1 (one) in a 1,000,000 (million) chance of going wrong, it you happen to be that one..Also, keep in mind that by statistics, you have more of a chance of having something go wrong when taking medications.  Who should not have this procedure? If you are on a blood thinning medication (e.g. Coumadin, Plavix, see list of "Blood Thinners"), or if you have an active infection going on, you should not have the procedure.  If you are taking any blood thinners, please inform your physician.  How should I prepare for this procedure?  Do not eat or drink anything at least six hours prior to the procedure.  Bring a driver with you .  It cannot be a taxi.  Come accompanied by an adult that can drive you back, and that is strong enough to help you if your legs get weak or numb from the local anesthetic.  Take all of your medicines the morning of the procedure with just enough water to swallow them.  If you have diabetes, make sure that you are scheduled to have your procedure done first thing in the morning, whenever possible.  If you have diabetes, take only half of  your insulin dose and notify our nurse that you have done so as soon as you arrive at the clinic.  If you are diabetic, but only take blood sugar pills (oral hypoglycemic), then do not take them on the morning of your procedure.  You may take them after you have had the procedure.  Do not take aspirin or any aspirin-containing medications, at least eleven (11) days prior to the procedure.  They may prolong bleeding.  Wear loose fitting clothing that may be easy to take off and that you would not mind if it got stained with Betadine or blood.  Do not wear any jewelry or perfume  Remove any nail coloring.  It will interfere with some of our monitoring equipment.  NOTE: Remember that this is not meant to be interpreted as a  complete list of all possible complications.  Unforeseen problems may occur.  BLOOD THINNERS The following drugs contain aspirin or other products, which can cause increased bleeding during surgery and should not be taken for 2 weeks prior to and 1 week after surgery.  If you should need take something for relief of minor pain, you may take acetaminophen which is found in Tylenol,m Datril, Anacin-3 and Panadol. It is not blood thinner. The products listed below are.  Do not take any of the products listed below in addition to any listed on your instruction sheet.  A.P.C or A.P.C with Codeine Codeine Phosphate Capsules #3 Ibuprofen Ridaura  ABC compound Congesprin Imuran rimadil  Advil Cope Indocin Robaxisal  Alka-Seltzer Effervescent Pain Reliever and Antacid Coricidin or Coricidin-D  Indomethacin Rufen  Alka-Seltzer plus Cold Medicine Cosprin Ketoprofen S-A-C Tablets  Anacin Analgesic Tablets or Capsules Coumadin Korlgesic Salflex  Anacin Extra Strength Analgesic tablets or capsules CP-2 Tablets Lanoril Salicylate  Anaprox Cuprimine Capsules Levenox Salocol  Anexsia-D Dalteparin Magan Salsalate  Anodynos Darvon compound Magnesium Salicylate Sine-off  Ansaid Dasin  Capsules Magsal Sodium Salicylate  Anturane Depen Capsules Marnal Soma  APF Arthritis pain formula Dewitt's Pills Measurin Stanback  Argesic Dia-Gesic Meclofenamic Sulfinpyrazone  Arthritis Bayer Timed Release Aspirin Diclofenac Meclomen Sulindac  Arthritis pain formula Anacin Dicumarol Medipren Supac  Analgesic (Safety coated) Arthralgen Diffunasal Mefanamic Suprofen  Arthritis Strength Bufferin Dihydrocodeine Mepro Compound Suprol  Arthropan liquid Dopirydamole Methcarbomol with Aspirin Synalgos  ASA tablets/Enseals Disalcid Micrainin Tagament  Ascriptin Doan's Midol Talwin  Ascriptin A/D Dolene Mobidin Tanderil  Ascriptin Extra Strength Dolobid Moblgesic Ticlid  Ascriptin with Codeine Doloprin or Doloprin with Codeine Momentum Tolectin  Asperbuf Duoprin Mono-gesic Trendar  Aspergum Duradyne Motrin or Motrin IB Triminicin  Aspirin plain, buffered or enteric coated Durasal Myochrisine Trigesic  Aspirin Suppositories Easprin Nalfon Trillsate  Aspirin with Codeine Ecotrin Regular or Extra Strength Naprosyn Uracel  Atromid-S Efficin Naproxen Ursinus  Auranofin Capsules Elmiron Neocylate Vanquish  Axotal Emagrin Norgesic Verin  Azathioprine Empirin or Empirin with Codeine Normiflo Vitamin E  Azolid Emprazil Nuprin Voltaren  Bayer Aspirin plain, buffered or children's or timed BC Tablets or powders Encaprin Orgaran Warfarin Sodium  Buff-a-Comp Enoxaparin Orudis Zorpin  Buff-a-Comp with Codeine Equegesic Os-Cal-Gesic   Buffaprin Excedrin plain, buffered or Extra Strength Oxalid   Bufferin Arthritis Strength Feldene Oxphenbutazone   Bufferin plain or Extra Strength Feldene Capsules Oxycodone with Aspirin   Bufferin with Codeine Fenoprofen Fenoprofen Pabalate or Pabalate-SF   Buffets II Flogesic Panagesic   Buffinol plain or Extra Strength Florinal or Florinal with Codeine Panwarfarin   Buf-Tabs Flurbiprofen Penicillamine   Butalbital Compound Four-way cold tablets Penicillin    Butazolidin Fragmin Pepto-Bismol   Carbenicillin Geminisyn Percodan   Carna Arthritis Reliever Geopen Persantine   Carprofen Gold's salt Persistin   Chloramphenicol Goody's Phenylbutazone   Chloromycetin Haltrain Piroxlcam   Clmetidine heparin Plaquenil   Cllnoril Hyco-pap Ponstel   Clofibrate Hydroxy chloroquine Propoxyphen         Before stopping any of these medications, be sure to consult the physician who ordered them.  Some, such as Coumadin (Warfarin) are ordered to prevent or treat serious conditions such as "deep thrombosis", "pumonary embolisms", and other heart problems.  The amount of time that you may need off of the medication may also vary with the medication and the reason for which you were taking it.  If you are taking any of these medications, please make sure you notify your pain physician before you  undergo any procedures.         Facet Blocks Patient Information  Description: The facets are joints in the spine between the vertebrae.  Like any joints in the body, facets can become irritated and painful.  Arthritis can also effect the facets.  By injecting steroids and local anesthetic in and around these joints, we can temporarily block the nerve supply to them.  Steroids act directly on irritated nerves and tissues to reduce selling and inflammation which often leads to decreased pain.  Facet blocks may be done anywhere along the spine from the neck to the low back depending upon the location of your pain.   After numbing the skin with local anesthetic (like Novocaine), a small needle is passed onto the facet joints under x-ray guidance.  You may experience a sensation of pressure while this is being done.  The entire block usually lasts about 15-25 minutes.   Conditions which may be treated by facet blocks:   Low back/buttock pain  Neck/shoulder pain  Certain types of headaches  Preparation for the injection:  1. Do not eat any solid food or dairy  products within 6 hours of your appointment. 2. You may drink clear liquid up to 2 hours before appointment.  Clear liquids include water, black coffee, juice or soda.  No milk or cream please. 3. You may take your regular medication, including pain medications, with a sip of water before your appointment.  Diabetics should hold regular insulin (if taken separately) and take 1/2 normal NPH dose the morning of the procedure.  Carry some sugar containing items with you to your appointment. 4. A driver must accompany you and be prepared to drive you home after your procedure. 5. Bring all your current medications with you. 6. An IV may be inserted and sedation may be given at the discretion of the physician. 7. A blood pressure cuff, EKG and other monitors will often be applied during the procedure.  Some patients may need to have extra oxygen administered for a short period. 8. You will be asked to provide medical information, including your allergies and medications, prior to the procedure.  We must know immediately if you are taking blood thinners (like Coumadin/Warfarin) or if you are allergic to IV iodine contrast (dye).  We must know if you could possible be pregnant.  Possible side-effects:   Bleeding from needle site  Infection (rare, may require surgery)  Nerve injury (rare)  Numbness & tingling (temporary)  Difficulty urinating (rare, temporary)  Spinal headache (a headache worse with upright posture)  Light-headedness (temporary)  Pain at injection site (serveral days)  Decreased blood pressure (rare, temporary)  Weakness in arm/leg (temporary)  Pressure sensation in back/neck (temporary)   Call if you experience:   Fever/chills associated with headache or increased back/neck pain  Headache worsened by an upright position  New onset, weakness or numbness of an extremity below the injection site  Hives or difficulty breathing (go to the emergency  room)  Inflammation or drainage at the injection site(s)  Severe back/neck pain greater than usual  New symptoms which are concerning to you  Please note:  Although the local anesthetic injected can often make your back or neck feel good for several hours after the injection, the pain will likely return. It takes 3-7 days for steroids to work.  You may not notice any pain relief for at least one week.  If effective, we will often do a series of 2-3 injections spaced  3-6 weeks apart to maximally decrease your pain.  After the initial series, you may be a candidate for a more permanent nerve block of the facets.  If you have any questions, please call #[phone] [facility]  Patient has appointment with Dr. Malvin Johns at [facility] on July 26 @ 9:30am. Please take your insurance card, copay and medications to that visit.   Dr. Metta Clines would also like for you to see Dr. Letta Moynahan, our phycologist, for an evaluation for depression. He will call you to set up an appointment at your convinience.

## 2014-09-09 NOTE — Progress Notes (Signed)
Safety precautions to be maintained throughout the outpatient stay will include: orient to surroundings, keep bed in low position, maintain call bell within reach at all times, provide assistance with transfer out of bed and ambulation.  

## 2014-09-09 NOTE — Progress Notes (Signed)
Subjective:    Patient ID: Peter Weiss, male    DOB: 12-27-1940, 74 y.o.   MRN: [id number]  HPI  Patient is 74 year old gentleman who comes to pain management Center at the request of Dr.Hande  for further evaluation and treatment of pain involving the lower back and lower extremity region predominantly. The patient has a long history of lower back and lower extremity pain and states that he has recently moved to [place] where he is living with a friend of 52 years who is disabled and has cancer. On today's visit the patient was poor historian with several instances of being unable to recall an accurate history. The patient admitted to memory impairment as well as frequent falling. Discussed the patient's condition and informed patient that we had concern regarding his memory impairment as well as frequent falling. The patient also lives with friend who is unable to provide any assistance to the patient. We informed the patient that we would like for patient to bring a responsible adult to [facility] every time that he would come point appointment since the patient had difficulty with him. Memory impairment. We also informed the patient that we would need updated studies of the spine, psych evaluation for evaluation of his depression and general psych evaluation as well as neurological assessment of his memory impairment. The patient requested pain medications on today's visit and we informed patient that we would be unable to provide medications at this time. The patient was not pleased with this response and according to Mrs. Jeraldine Loots Maynor the patient informed the staff that this scheduling of evaluations and additional studies has just been a pain in the(BLANK)! The patient stated that he doubted seriously that he would return and that he would seek his medications elsewhere. The patient described his pain as unknowing disabling distressing exhausting getting longer itching  tenderness throbbing tingling" sensation awakening patient from sleep. Pain is associated with weakness tingling swelling sadness inability to control bladder and ability to concentrate associated with amplitudes fatigue and dizziness depression. Patient states the pain increases with bending climbing kneeling lifting motion standing squatting stooping twisting walking working. Pain decreases with resting warm showers baths and medications we informed patient that we were uncomfortable prescribing medications due to patient's general medical condition and that we would prefer to have patient undergo further evaluations and also consider evaluation at a tertiary pain clinic to address prescribing medications for the patient. We informed patient that we would proceed with interventional treatment in attempt to decrease his symptoms, minimize progression of his symptoms, and hopefully avoid need for more involved treatment the patient was seemingly irritated by this and expressed more discontent met with the staff as well as expressed his lack of interest in undergoing additional evaluations including both consultations and studies. We will schedule patient for lumbar facet, medial branch nerve, blocks to be performed at time return appointment should patient wished to proceed with such treatment.     Review of Systems     Cardiovascular Heart trouble, daily aspirin intake high blood pressure prior heart surgery heart valve problems aspirin therapy  Pulmonary Shortness of breath( without recent change in shortness of breath) sleep apnea  Neurological The patient is admitted to forgetfulness with significant memory impairment as well as dizziness and frequent falling  Psychological Anxiety depression  Gastrointestinal Unremarkable  Genitourinary Unremarkable  Hematological Unremarkable  Endocrine Diabetes mellitus  Rheumatological Unremarkable  Musculoskeletal Unremarkable  Other  significant Unremarkable        ,  Objective:   Physical Exam  Patient was elderly gentleman who appeared to be with poor hygiene. Patient was poor historian and exhibits difficulty providing accurate history.  The patient also moved slowly and exhibited an unsteady gait. The patient admitted to frequent falling, memory impairment, and lack of family members or friends to assist patient with daily needs. There was tennis to palpation of the splenius capitis and occipitalis musculature regions palpation of which reproduced moderate discomfort. There were no new lesions of the head and neck noted. Palpation of the cervical and thoracic paraspinal musculature region and facet regions were with tenderness to palpation of moderate degree. There was mild tenderness of the acromioclavicular glenohumeral joint region. Patient was with unremarkable Spurling's maneuver. Tinel and Phalen's maneuver were without increase of pain of significant degree. No crepitus of the thoracic region was noted. Palpation over the lumbar paraspinal musculature region lumbar facet region was with tenderness to palpation of moderate degree. Lateral bending and rotation reproduce moderate to moderately severe discomfort. There was moderate tenderness over the PSIS and PII S regions on the left as well as on the right. Mild tennis to palpation of the greater trochanteric region was noted. A 20 without an increase of pain with dorsiflexion noted. There was negative clonus and negative Homans. Abdomen nontender and no costovertebral angle tenderness was noted.         Assessment & Plan:   Degenerative disc disease lumbar spine The patient was patient with findings suggestive of lumbar facet syndrome and lumbar stenosis with neurogenic claudication and unstable gait. Prior  studies of the spine were unavailable and patient was poor historian unable to provide information of your studies were  obtained .  Dementia  Diabetes mellitus There is concern regarding neuropathy contributing to patient's lower extremity symptoms and unsteady gait  Syncope and collapse Patient admits to frequent falling and passing out   Plan  Continue present medications. We informed patient that due to his history of dementia, syncope and collapse, and other medical conditions that the preferred to avoid prescribing medications for treatment of his condition. We will consider patient for interventional treatment and have schedule patient for procedure to be performed at time of return appointment in attempt to decrease patient's lumbar and lower extremity pain  Lumbar facet, medial branch nerve, blocks to be performed at time of return appointment As explained to patient we will need radical clearance from Dr.Hande as well as permission to hold aspirin prior to the procedure. We will also need patient follow-up with Dr. Ginette Pitman to treat patient's lower extremity edema prior to the lumbar facet medial branch nerve, blocks being performed   F/U PCP for evaliation of  BP and general medical  condition.As explained to patient we will need to have patient follow-up with Dr.Hande to discuss referral to a tertiary center for evaluation of patient's condition since we feel that patient is to great a risk to prescribed medications for treatment of his condition at this time. We will defer to have patient evaluated at a tertiary pain clinic for his medication regimen can be determined and implemented.  F/U surgical evaluation.Neurosurgical evaluation of lumbar and lower extremity pain as discussed  F/U neurological evaluation.Neurological evaluation has been scheduled to further evaluate patient's dementia and frequent falling. Patient has history of syncope and collapse with significant cardiac history is well  Psych evaluation of depression. Please schedule patient for psych evaluation for further assessment of his  depression anxiety and general psych condition  MRI  of the lumbar spine MRI of the lumbar spine was scheduled today to evaluate for degenerative disc disease, herniated nucleus pulposus, stenosis, and other abnormalities which may be contributing to patient's lumbar and lower extremity pain and symptoms  May consider radiofrequency rhizolysis or intraspinal procedures pending response to present treatment and F/U evaluation.  Patient to call [facility] should patient have concerns prior to scheduled return appointment.

## 2014-09-20 ENCOUNTER — Other Ambulatory Visit: Payer: Self-pay | Admitting: Pain Medicine

## 2014-09-23 ENCOUNTER — Ambulatory Visit: Payer: Medicare Other | Admitting: Pain Medicine

## 2014-10-31 NOTE — Telephone Encounter (Signed)
erro  neous encounter

## 2014-12-23 ENCOUNTER — Emergency Department: Payer: Medicare Other

## 2014-12-23 ENCOUNTER — Observation Stay
Admission: EM | Admit: 2014-12-23 | Discharge: 2014-12-24 | Disposition: A | Discharge status: Home / Self Care | Payer: Medicare Other | Attending: Internal Medicine | Admitting: Internal Medicine

## 2014-12-23 DIAGNOSIS — H109 Unspecified conjunctivitis: Secondary | ICD-10-CM | POA: Insufficient documentation

## 2014-12-23 DIAGNOSIS — R079 Chest pain, unspecified: Secondary | ICD-10-CM | POA: Diagnosis not present

## 2014-12-23 DIAGNOSIS — Z833 Family history of diabetes mellitus: Secondary | ICD-10-CM | POA: Insufficient documentation

## 2014-12-23 DIAGNOSIS — R42 Dizziness and giddiness: Secondary | ICD-10-CM

## 2014-12-23 DIAGNOSIS — Z23 Encounter for immunization: Secondary | ICD-10-CM | POA: Diagnosis not present

## 2014-12-23 DIAGNOSIS — M160 Bilateral primary osteoarthritis of hip: Secondary | ICD-10-CM | POA: Insufficient documentation

## 2014-12-23 DIAGNOSIS — Z809 Family history of malignant neoplasm, unspecified: Secondary | ICD-10-CM | POA: Insufficient documentation

## 2014-12-23 DIAGNOSIS — Z9119 Patient's noncompliance with other medical treatment and regimen: Secondary | ICD-10-CM | POA: Insufficient documentation

## 2014-12-23 DIAGNOSIS — F329 Major depressive disorder, single episode, unspecified: Secondary | ICD-10-CM | POA: Insufficient documentation

## 2014-12-23 DIAGNOSIS — R296 Repeated falls: Secondary | ICD-10-CM | POA: Diagnosis not present

## 2014-12-23 DIAGNOSIS — R0781 Pleurodynia: Secondary | ICD-10-CM

## 2014-12-23 DIAGNOSIS — R000 Tachycardia, unspecified: Secondary | ICD-10-CM

## 2014-12-23 DIAGNOSIS — W19XXXA Unspecified fall, initial encounter: Secondary | ICD-10-CM

## 2014-12-23 DIAGNOSIS — Z8249 Family history of ischemic heart disease and other diseases of the circulatory system: Secondary | ICD-10-CM | POA: Insufficient documentation

## 2014-12-23 DIAGNOSIS — M7989 Other specified soft tissue disorders: Secondary | ICD-10-CM | POA: Insufficient documentation

## 2014-12-23 DIAGNOSIS — Z952 Presence of prosthetic heart valve: Secondary | ICD-10-CM | POA: Insufficient documentation

## 2014-12-23 DIAGNOSIS — M1990 Unspecified osteoarthritis, unspecified site: Secondary | ICD-10-CM | POA: Insufficient documentation

## 2014-12-23 DIAGNOSIS — I471 Supraventricular tachycardia: Secondary | ICD-10-CM | POA: Insufficient documentation

## 2014-12-23 DIAGNOSIS — Z79899 Other long term (current) drug therapy: Secondary | ICD-10-CM | POA: Insufficient documentation

## 2014-12-23 DIAGNOSIS — Z9889 Other specified postprocedural states: Secondary | ICD-10-CM | POA: Diagnosis not present

## 2014-12-23 DIAGNOSIS — R32 Unspecified urinary incontinence: Secondary | ICD-10-CM | POA: Diagnosis not present

## 2014-12-23 DIAGNOSIS — G709 Myoneural disorder, unspecified: Secondary | ICD-10-CM | POA: Insufficient documentation

## 2014-12-23 DIAGNOSIS — Z811 Family history of alcohol abuse and dependence: Secondary | ICD-10-CM | POA: Insufficient documentation

## 2014-12-23 DIAGNOSIS — M79605 Pain in left leg: Secondary | ICD-10-CM | POA: Diagnosis not present

## 2014-12-23 DIAGNOSIS — E119 Type 2 diabetes mellitus without complications: Secondary | ICD-10-CM | POA: Diagnosis not present

## 2014-12-23 DIAGNOSIS — M8580 Other specified disorders of bone density and structure, unspecified site: Secondary | ICD-10-CM | POA: Insufficient documentation

## 2014-12-23 DIAGNOSIS — K219 Gastro-esophageal reflux disease without esophagitis: Secondary | ICD-10-CM | POA: Insufficient documentation

## 2014-12-23 DIAGNOSIS — I10 Essential (primary) hypertension: Secondary | ICD-10-CM | POA: Diagnosis not present

## 2014-12-23 DIAGNOSIS — E785 Hyperlipidemia, unspecified: Secondary | ICD-10-CM | POA: Diagnosis not present

## 2014-12-23 LAB — TSH: TSH: 1.26 u[IU]/mL (ref 0.350–4.500)

## 2014-12-23 LAB — URINALYSIS COMPLETE WITH MICROSCOPIC (ARMC ONLY)
BACTERIA UA: NONE SEEN
BILIRUBIN URINE: NEGATIVE
Glucose, UA: NEGATIVE mg/dL
HGB URINE DIPSTICK: NEGATIVE
Leukocytes, UA: NEGATIVE
Nitrite: NEGATIVE
PH: 5 (ref 5.0–8.0)
PROTEIN: NEGATIVE mg/dL
Specific Gravity, Urine: 1.019 (ref 1.005–1.030)

## 2014-12-23 LAB — COMPREHENSIVE METABOLIC PANEL
ALK PHOS: 73 U/L (ref 38–126)
ALT: 20 U/L (ref 17–63)
AST: 24 U/L (ref 15–41)
Albumin: 4 g/dL (ref 3.5–5.0)
Anion gap: 7 (ref 5–15)
BUN: 17 mg/dL (ref 6–20)
CALCIUM: 9.2 mg/dL (ref 8.9–10.3)
CHLORIDE: 104 mmol/L (ref 101–111)
CO2: 29 mmol/L (ref 22–32)
CREATININE: 0.97 mg/dL (ref 0.61–1.24)
GFR calc Af Amer: >60 mL/min (ref >60)
GFR calc non Af Amer: >60 mL/min (ref >60)
GLUCOSE: 122 mg/dL — AB (ref 65–99)
Potassium: 3.9 mmol/L (ref 3.5–5.1)
SODIUM: 140 mmol/L (ref 135–145)
Total Bilirubin: 0.9 mg/dL (ref 0.3–1.2)
Total Protein: 7.5 g/dL (ref 6.5–8.1)

## 2014-12-23 LAB — CBC WITH DIFFERENTIAL/PLATELET
BASOS ABS: 0 10*3/uL (ref 0–0.1)
BASOS PCT: 1 %
EOS ABS: 0.2 10*3/uL (ref 0–0.7)
Eosinophils Relative: 3 %
HCT: 39 % — ABNORMAL LOW (ref 40.0–52.0)
HEMOGLOBIN: 12.6 g/dL — AB (ref 13.0–18.0)
Lymphocytes Relative: 14 %
Lymphs Abs: 0.9 10*3/uL — ABNORMAL LOW (ref 1.0–3.6)
MCH: 27.5 pg (ref 26.0–34.0)
MCHC: 32.2 g/dL (ref 32.0–36.0)
MCV: 85.3 fL (ref 80.0–100.0)
MONO ABS: 0.5 10*3/uL (ref 0.2–1.0)
MONOS PCT: 7 %
NEUTROS ABS: 5 10*3/uL (ref 1.4–6.5)
NEUTROS PCT: 75 %
Platelets: 220 10*3/uL (ref 150–440)
RBC: 4.57 MIL/uL (ref 4.40–5.90)
RDW: 17.1 % — AB (ref 11.5–14.5)
WBC: 6.6 10*3/uL (ref 3.8–10.6)

## 2014-12-23 LAB — TROPONIN I: Troponin I: 0.03 ng/mL (ref <0.031)

## 2014-12-23 MED ORDER — DONEPEZIL HCL 5 MG PO TABS
10.0000 mg | ORAL_TABLET | Freq: Every day | ORAL | Status: DC
  Administered 2014-12-23: 10 mg via ORAL
  Filled 2014-12-23: qty 2

## 2014-12-23 MED ORDER — SERTRALINE HCL 50 MG PO TABS
50.0000 mg | ORAL_TABLET | Freq: Every day | ORAL | Status: DC
  Administered 2014-12-24: 50 mg via ORAL
  Filled 2014-12-23: qty 1

## 2014-12-23 MED ORDER — INFLUENZA VAC SPLIT QUAD 0.5 ML IM SUSY
0.5000 mL | PREFILLED_SYRINGE | INTRAMUSCULAR | Status: AC
  Administered 2014-12-24: 0.5 mL via INTRAMUSCULAR
  Filled 2014-12-23: qty 0.5

## 2014-12-23 MED ORDER — ACETAMINOPHEN 650 MG RE SUPP: 650.0000 mg | Freq: Four times a day (QID) | RECTAL | Status: DC | PRN

## 2014-12-23 MED ORDER — SODIUM CHLORIDE 0.9 % IJ SOLN
3.0000 mL | Freq: Two times a day (BID) | INTRAMUSCULAR | Status: DC
  Administered 2014-12-23: 3 mL via INTRAVENOUS

## 2014-12-23 MED ORDER — ACETAMINOPHEN 325 MG PO TABS: 650.0000 mg | ORAL_TABLET | Freq: Four times a day (QID) | ORAL | Status: DC | PRN

## 2014-12-23 MED ORDER — MORPHINE SULFATE (PF) 2 MG/ML IV SOLN
2.0000 mg | INTRAVENOUS | Status: DC | PRN
  Administered 2014-12-24: 2 mg via INTRAVENOUS
  Filled 2014-12-23: qty 1

## 2014-12-23 MED ORDER — VENLAFAXINE HCL ER 75 MG PO CP24
150.0000 mg | ORAL_CAPSULE | Freq: Every day | ORAL | Status: DC
  Administered 2014-12-24: 150 mg via ORAL
  Filled 2014-12-23: qty 2

## 2014-12-23 MED ORDER — SODIUM CHLORIDE 0.9 % IV SOLN
INTRAVENOUS | Status: DC
  Administered 2014-12-23: 23:00:00 via INTRAVENOUS

## 2014-12-23 MED ORDER — SODIUM CHLORIDE 0.9 % IV BOLUS (SEPSIS)
1000.0000 mL | Freq: Once | INTRAVENOUS | Status: AC
  Administered 2014-12-23: 1000 mL via INTRAVENOUS

## 2014-12-23 MED ORDER — GABAPENTIN 400 MG PO CAPS
400.0000 mg | ORAL_CAPSULE | Freq: Three times a day (TID) | ORAL | Status: DC
  Administered 2014-12-23 – 2014-12-24 (×2): 400 mg via ORAL
  Filled 2014-12-23 (×2): qty 1

## 2014-12-23 MED ORDER — OXYCODONE HCL 5 MG PO TABS
5.0000 mg | ORAL_TABLET | ORAL | Status: DC | PRN
  Administered 2014-12-23: 5 mg via ORAL
  Filled 2014-12-23: qty 1

## 2014-12-23 MED ORDER — PANTOPRAZOLE SODIUM 40 MG PO TBEC
40.0000 mg | DELAYED_RELEASE_TABLET | Freq: Every day | ORAL | Status: DC
  Administered 2014-12-24: 40 mg via ORAL
  Filled 2014-12-23: qty 1

## 2014-12-23 MED ORDER — SIMVASTATIN 20 MG PO TABS
20.0000 mg | ORAL_TABLET | Freq: Every day | ORAL | Status: DC
  Administered 2014-12-23: 20 mg via ORAL
  Filled 2014-12-23: qty 1

## 2014-12-23 MED ORDER — HYDRALAZINE HCL 20 MG/ML IJ SOLN
10.0000 mg | INTRAMUSCULAR | Status: DC | PRN
  Filled 2014-12-23: qty 1

## 2014-12-23 MED ORDER — METOPROLOL TARTRATE 25 MG PO TABS
12.5000 mg | ORAL_TABLET | Freq: Two times a day (BID) | ORAL | Status: DC
Start: 2014-12-23 — End: 2014-12-24
  Administered 2014-12-23 – 2014-12-24 (×2): 12.5 mg via ORAL
  Filled 2014-12-23 (×2): qty 1

## 2014-12-23 MED ORDER — HEPARIN SODIUM (PORCINE) 5000 UNIT/ML IJ SOLN
5000.0000 [IU] | Freq: Three times a day (TID) | INTRAMUSCULAR | Status: DC
  Administered 2014-12-23 – 2014-12-24 (×2): 5000 [IU] via SUBCUTANEOUS
  Filled 2014-12-23 (×2): qty 1

## 2014-12-23 MED ORDER — TAMSULOSIN HCL 0.4 MG PO CAPS
0.4000 mg | ORAL_CAPSULE | Freq: Every day | ORAL | Status: DC
  Administered 2014-12-24: 0.4 mg via ORAL
  Filled 2014-12-23: qty 1

## 2014-12-23 NOTE — Progress Notes (Signed)
LCSW met with patient and had a good discussion on in home support. Several choice were provided and for cost saving he might put up an add in his community board and hire privaetly. Even though he has insurance it wont cover all the expense.  Patient is agreeable to follow up with heart doctor. Take his medication as prescribed.

## 2014-12-23 NOTE — ED Notes (Signed)
Pt to [place] after CT and X-ray

## 2014-12-23 NOTE — H&P (Signed)
[facility] - [place] at [facility]   PATIENT NAME: Peter Weiss    MR#:  [id number]  DATE OF BIRTH:  [birth year]   DATE OF ADMISSION:  12/23/2014  PRIMARY CARE PHYSICIAN: none  REQUESTING/REFERRING PHYSICIAN: Quale  CHIEF COMPLAINT:   Chief Complaint  Patient presents with  . Fall  . Hip Pain    HISTORY OF PRESENT ILLNESS:  Peter Weiss  is a 74 y.o. male with a known history of essential hypertension, hyperlipidemia presenting with multiple falls. He describes having approximately 3 falls over the last week. States that when he goes from sitting to standing position or standing for long period of time he becomes dizzy/lightheaded. Because of this he has had multiple falls. Suffered some minor injuries to his left side as well as right leg. Because of multiple falls at present to Hospital further workup and evaluation. Noted emergency department to be persistently tachycardic. He's been evaluated by multiple ER physicians as well as casework/social work given tachycardia and jaundice mobility there is concern of safety upon being discharged, thus they requested admission  PAST MEDICAL HISTORY:   Past Medical History  Diagnosis Date  . Arthritis   . Chicken pox   . Heart murmur   . Hypertension   . Hyperlipidemia   . Urinary incontinence   . History of fainting spells of unknown cause   . Diabetes mellitus without complication (HCC)   . Depression   . Aortic stenosis   . Neuromuscular disorder (HCC)     PAST SURGICAL HISTORY:   Past Surgical History  Procedure Laterality Date  . Goiter  2008  . Multiple extractions with alveoloplasty N/A 04/24/2014    Procedure: Extraction of tooth #'s 2,3,5,12,13,14,15,20 with allveoloplasty and gross debridement of remaining dentition and sectioning of bridge at mesial of abutment tooth #32.;  Surgeon: Charlynne Pander, DDS;  Location: [facility] OR;  Service: Oral Surgery;  Laterality: N/A;  . Aortic valve  replacement N/A 04/26/2014    Procedure: AORTIC VALVE REPLACEMENT (AVR);  Surgeon: Loreli Slot, MD;  Location: [facility] OR;  Service: Open Heart Surgery;  Laterality: N/A;  . Maze N/A 04/26/2014    Procedure: MAZE;  Surgeon: Loreli Slot, MD;  Location: [facility] OR;  Service: Open Heart Surgery;  Laterality: N/A;  . Tee without cardioversion N/A 04/26/2014    Procedure: TRANSESOPHAGEAL ECHOCARDIOGRAM (TEE);  Surgeon: Loreli Slot, MD;  Location: [facility] OR;  Service: Open Heart Surgery;  Laterality: N/A;  . Esophagogastroduodenoscopy N/A 07/04/2014    Procedure: ESOPHAGOGASTRODUODENOSCOPY (EGD);  Surgeon: Jeani Hawking, MD;  Location: [facility] ENDOSCOPY;  Service: Endoscopy;  Laterality: N/A;  . Cardiac catheterization  03/2014    SOCIAL HISTORY:   Social History  Substance Use Topics  . Smoking status: Never Smoker   . Smokeless tobacco: Never Used  . Alcohol Use: No    FAMILY HISTORY:   Family History  Problem Relation Age of Onset  . Alcohol abuse Mother   . Heart disease Mother   . Diabetes Mother   . Heart disease Father   . Diabetes Father   . Cancer Mother   . Cancer Father   . Alcohol abuse Father   . Alcohol abuse Brother     DRUG ALLERGIES:  No Known Allergies  REVIEW OF SYSTEMS:  REVIEW OF SYSTEMS:  CONSTITUTIONAL: Denies fevers, chills, positive fatigue, weakness.  EYES: Denies blurred vision, double vision, or eye pain.  EARS, NOSE, THROAT: Denies tinnitus, ear pain, hearing loss.  RESPIRATORY: denies cough, shortness of breath, wheezing  CARDIOVASCULAR: Denies chest pain, palpitations, edema.  GASTROINTESTINAL: Denies nausea, vomiting, diarrhea, abdominal pain.  GENITOURINARY: Denies dysuria, hematuria.  ENDOCRINE: Denies nocturia or thyroid problems. HEMATOLOGIC AND LYMPHATIC: Denies easy bruising or bleeding.  SKIN: Denies rash or lesions.  MUSCULOSKELETAL: Denies pain in neck, back, shoulder, knees, hips, or further arthritic symptoms.  NEUROLOGIC:  Denies paralysis, paresthesias.  PSYCHIATRIC: Denies anxiety or depressive symptoms. Otherwise full review of systems performed by me is negative.   MEDICATIONS AT HOME:   Prior to Admission medications   Medication Sig Start Date End Date Taking? Authorizing Provider  donepezil (ARICEPT) 10 MG tablet Take 1 tablet (10 mg total) by mouth at bedtime. 07/31/14  Yes Antonieta Iba, MD  furosemide (LASIX) 20 MG tablet Take 1 tablet (20 mg total) by mouth daily. 07/31/14  Yes Antonieta Iba, MD  gabapentin (NEURONTIN) 400 MG capsule Take 400 mg by mouth 3 (three) times daily.   Yes Historical Provider, MD  metoprolol tartrate (LOPRESSOR) 25 MG tablet Take 12.5 mg by mouth 2 (two) times daily.    Yes Historical Provider, MD  omeprazole (PRILOSEC) 20 MG capsule Take 1 capsule (20 mg total) by mouth daily. 07/31/14  Yes Antonieta Iba, MD  Oxycodone HCl 10 MG TABS Take 10 mg by mouth every 6 (six) hours as needed (for pain).   Yes Historical Provider, MD  potassium chloride (K-DUR) 10 MEQ tablet Take 10 mEq by mouth daily.   Yes Historical Provider, MD  sertraline (ZOLOFT) 50 MG tablet Take 1 tablet (50 mg total) by mouth daily. 07/31/14  Yes Antonieta Iba, MD  simvastatin (ZOCOR) 20 MG tablet Take 20 mg by mouth at bedtime.    Yes Historical Provider, MD  tamsulosin (FLOMAX) 0.4 MG CAPS capsule Take 1 capsule (0.4 mg total) by mouth daily. 07/31/14  Yes Antonieta Iba, MD  venlafaxine XR (EFFEXOR-XR) 150 MG 24 hr capsule Take 150 mg by mouth daily.   Yes Historical Provider, MD      VITAL SIGNS:  Blood pressure 161/107, pulse 110, temperature 97.5 F (36.4 C), temperature source Oral, resp. rate 18, height  (1.803 m), weight 240 lb (108.863 kg), SpO2 98 %.  PHYSICAL EXAMINATION:  VITAL SIGNS: Filed Vitals:   12/23/14 2030  BP: 161/107  Pulse: 110  Temp:   Resp:    GENERAL:74 y.o.male currently in no acute distress. Disheveled HEAD: Normocephalic, atraumatic.  EYES: Pupils  equal, round, reactive to light. Extraocular muscles intact. No scleral icterus.  MOUTH: Moist mucosal membrane. Dentition intact. No abscess noted.  EAR, NOSE, THROAT: Clear without exudates. No external lesions.  NECK: Supple. No thyromegaly. No nodules. No JVD.  PULMONARY: Clear to ascultation, without wheeze rails or rhonci. No use of accessory muscles, Good respiratory effort. good air entry bilaterally CHEST: Nontender to palpation.  CARDIOVASCULAR: S1 and S2. Regular rate and rhythm. No murmurs, rubs, or gallops. No edema. Pedal pulses 2+ bilaterally.  GASTROINTESTINAL: Soft, nontender, nondistended. No masses. Positive bowel sounds. No hepatosplenomegaly.  MUSCULOSKELETAL: No swelling, clubbing, or edema. Range of motion full in all extremities.  NEUROLOGIC: Cranial nerves II through XII are intact. No gross focal neurological deficits. Sensation intact. Reflexes intact.  SKIN: No ulceration, lesions, rashes, or cyanosis. Skin warm and dry. Turgor intact.  PSYCHIATRIC: Mood, affect within normal limits. The patient is awake, alert and oriented x 3. Insight, judgment intact.    LABORATORY PANEL:   CBC  Recent Labs  Lab 12/23/14 1346  WBC 6.6  HGB 12.6*  HCT 39.0*  PLT 220   ------------------------------------------------------------------------------------------------------------------  Chemistries   Recent Labs Lab 12/23/14 1346  NA 140  K 3.9  CL 104  CO2 29  GLUCOSE 122*  BUN 17  CREATININE 0.97  CALCIUM 9.2  AST 24  ALT 20  ALKPHOS 73  BILITOT 0.9   ------------------------------------------------------------------------------------------------------------------  Cardiac Enzymes  Recent Labs Lab 12/23/14 1346  TROPONINI 0.03   ------------------------------------------------------------------------------------------------------------------  RADIOLOGY:  Dg Chest 1 View  12/23/2014   CLINICAL DATA:  Patient fell on left side yesterday with left  posterior chest pain and dizziness  EXAM: CHEST 1 VIEW  COMPARISON:  07/04/2014  FINDINGS: Mild cardiac enlargement stable as is left atrial appendage clip. Status post median sternotomy. Stable aortic calcification.  Vascular pattern is normal. No consolidation effusion or pneumothorax. Osseous thorax appears intact.  IMPRESSION: No active disease.   Electronically Signed   By: Esperanza Heir M.D.   On: 12/23/2014 14:59   Dg Ribs Unilateral Left  12/23/2014   CLINICAL DATA:  Status post fall on left side, left-sided posterior chest pain and dizziness.  EXAM: LEFT RIBS - 2 VIEW  COMPARISON:  Chest x-ray dated 07/04/2014.  FINDINGS: BB placed on the soft tissues at the site of patient's left-sided pain. No underlying rib fracture or dislocation identified. All ribs appear intact and well aligned. No focal cortical irregularity or osseous lesion. The underlying left lung appears clear. Soft tissues about the left chest are unremarkable.  IMPRESSION: No rib fracture or dislocation seen.   Electronically Signed   By: Bary Richard M.D.   On: 12/23/2014 15:01   Ct Head Wo Contrast  12/23/2014   CLINICAL DATA:  Dizziness for 1 month, 3 falls in the past several days, urinary incontinence  EXAM: CT HEAD WITHOUT CONTRAST  TECHNIQUE: Contiguous axial images were obtained from the base of the skull through the vertex without intravenous contrast.  COMPARISON:  03/19/2014  FINDINGS: Atrophy diffusely with low attenuation in the deep white matter, most prominent focally in the anterior left parietal lobe. This is unchanged from 03/19/2014. No evidence of acute vascular territory infarct. There is no hemorrhage or extra-axial fluid. There is no hydrocephalus. The calvarium is intact. No significant opacities appreciated in the visualized portions of the paranasal sinuses.  IMPRESSION: Chronic involutional change with no acute findings   Electronically Signed   By: Esperanza Heir M.D.   On: 12/23/2014 14:57   US  Venous Img Lower Unilateral Left  12/23/2014   CLINICAL DATA:  Left lower extremity pain and swelling for 3 days  EXAM: LEFT LOWER EXTREMITY VENOUS DUPLEX ULTRASOUND  TECHNIQUE: Doppler venous assessment of the left lower extremity deep venous system was performed, including characterization of spectral flow, compressibility, and phasicity.  COMPARISON:  None.  FINDINGS: There is complete compressibility of the left common femoral, femoral, and popliteal veins.  Doppler analysis demonstrates respiratory phasicity and augmentation of flow upon calf compression.  IMPRESSION: No evidence of DVT in the left lower extremity.   Electronically Signed   By: Jolaine Click M.D.   On: 12/23/2014 15:20   Dg Hip Unilat With Pelvis 2-3 Views Left  12/23/2014   CLINICAL DATA:  Larey Seat several times yesterday hitting the left side, the patient is on blood thinner  EXAM: DG HIP (WITH OR WITHOUT PELVIS) 2-3V LEFT  COMPARISON:  None.  FINDINGS: For age there is only mild degenerative joint disease of the hips present.  No acute hip fracture is seen. The pelvic rami are intact. The bones are diffusely osteopenic. There are degenerative changes the lower lumbar spine. The SI joints appear corticated.  IMPRESSION: No acute fracture. Only mild degenerative change of the hips for age. Osteopenia.   Electronically Signed   By: Dwyane Dee M.D.   On: 12/23/2014 15:00    EKG:   Orders placed or performed during the hospital encounter of 12/23/14  . EKG 12-Lead  . EKG 12-Lead    IMPRESSION AND PLAN:   74 year old Caucasian gentleman history of essential hypertension, medical noncompliance presented with multiple falls found to be tachycardic  1. Multiple falls: Multifactorial, somewhat orthostatic given heart rate and symptoms, IV fluid hydration, restart home medications, check vitamin D level, physical therapy 2. Essential hypertension: Lopressor 3. GERD without esophagitis Prilosec 4. Hyperlipidemia unspecified: Flomax 5.  Venous thromboembolism prophylactic: Heparin subcutaneous    All the records are reviewed and case discussed with ED provider. Management plans discussed with the patient, family and they are in agreement.  CODE STATUS: Full  TOTAL TIME TAKING CARE OF THIS PATIENT: 35  minutes.    Hower,  Mardi Mainland.D on 12/23/2014 at 9:37 PM  Between 7am to 6pm - Pager - [phone]  After 6pm: House Pager: - [phone]  [facility]  Office  [phone]  CC: Primary care physician; none

## 2014-12-23 NOTE — ED Notes (Signed)
Patient transported to X-ray 

## 2014-12-23 NOTE — ED Provider Notes (Signed)
-----------------------------------------   6:15 PM on 12/23/2014 -----------------------------------------  Reevaluated the patient and he is awake and alert and not complaining of any concerns. He is working to get a ride home, and states he would like to go home. He does report is been feeling lightheaded with standing for the last week. This is caused him to have a couple of falls. He does still have some slight tachycardia after first liter of fluid, and based on the symptoms he tells me and concerned about possible orthostasis. I will give him a second liter of fluid and we'll check orthostatic vital times at this time.  Patient has been seen by a Child psychotherapist who has helped arrange for home health, and the patient has pamphlets and plans to follow-up with them. He also may close follow-up with his primary doctor.  ----------------------------------------- 7:45 PM on 12/23/2014 -----------------------------------------  After 2 L of fluid the patient still remains tachycardic. I discussed with him and also his neighbors report they've picked him up off the ground and numerous times at his home, having fallen several times this week. Given his history of frequent falls and persistent tachycardia we will admit the patient to hospital for further evaluation and workup.  Sharyn Creamer, MD 12/23/14 [phone]

## 2014-12-23 NOTE — ED Provider Notes (Signed)
[facility] Emergency Department Provider Note  ____________________________________________  Time seen: Approximately 1:15 PM  I have reviewed the triage vital signs and the nursing notes.   HISTORY  Chief Complaint Fall and Hip Pain    HPI Peter Weiss is a 74 y.o. male with a history of a stenosis with valve replacementon eliquis who is presenting today with increasing weakness with falls over the last month. He says that he is not dizzy, despite the triage note, but lightheaded when he stands up to walk. He complains of pain to his left hip as well as his left ribs. Denies any headache at this time. Denies any chest pain or shortness of breath. Denies any back pain at this time. Denies any fever home. Says that he has been incontinent of urine and is now wearing a depends. States that his falls are related to this lightheadedness and diffuse weakness.    Past Medical History  Diagnosis Date  . Arthritis   . Chicken pox   . Heart murmur   . Hypertension   . Hyperlipidemia   . Urinary incontinence   . History of fainting spells of unknown cause   . Diabetes mellitus without complication (HCC)   . Depression   . Aortic stenosis   . Neuromuscular disorder [facility])     Patient Active Problem List   Diagnosis Date Noted  . DDD (degenerative disc disease), lumbar 09/09/2014  . Facet syndrome, lumbar 09/09/2014  . Spinal stenosis, lumbar region, with neurogenic claudication 09/09/2014  . Neuropathy due to secondary diabetes (HCC) 09/09/2014  . Pain in the chest   . Upper GI bleed   . Acute blood loss anemia   . Other chest pain   . Abdominal aortic aneurysm (HCC)   . Essential hypertension   . Adjustment disorder with disturbance of conduct   . Chronic pain syndrome   . Urinary frequency   . Hemorrhagic shock 07/04/2014  . S/P AVR (aortic valve replacement) 07/04/2014  . Chest pain 07/04/2014  . Gastrointestinal hemorrhage 07/04/2014  .  Melena   . S/P AVR 04/26/2014  . Acute kidney injury (HCC)   . Aortic stenosis   . Atrial fibrillation, unspecified   . Dental caries   . Pyrexia   . Aortic valve stenosis, critical 04/20/2014  . Syncope and collapse 04/18/2014  . Dehydration 04/18/2014  . Hypotension 04/18/2014  . Syncope 04/18/2014  . Arterial hypotension 03/25/2014  . Lethargic 03/25/2014  . Adjustment disorder with mixed anxiety and depressed mood 03/25/2014  . Chronic back pain 09/28/2013  . Mild cognitive impairment 09/28/2013  . DM2 (diabetes mellitus, type 2) (HCC) 09/28/2013  . HLD (hyperlipidemia) 09/28/2013  . HTN (hypertension) 09/28/2013  . GERD (gastroesophageal reflux disease) 09/28/2013    Past Surgical History  Procedure Laterality Date  . Goiter  2008  . Multiple extractions with alveoloplasty N/A 04/24/2014    Procedure: Extraction of tooth #'s 2,3,5,12,13,14,15,20 with allveoloplasty and gross debridement of remaining dentition and sectioning of bridge at mesial of abutment tooth #32.;  Surgeon: Charlynne Pander, DDS;  Location: [facility] OR;  Service: Oral Surgery;  Laterality: N/A;  . Aortic valve replacement N/A 04/26/2014    Procedure: AORTIC VALVE REPLACEMENT (AVR);  Surgeon: Loreli Slot, MD;  Location: [facility] OR;  Service: Open Heart Surgery;  Laterality: N/A;  . Maze N/A 04/26/2014    Procedure: MAZE;  Surgeon: Loreli Slot, MD;  Location: [facility] OR;  Service: Open Heart Surgery;  Laterality: N/A;  .  Tee without cardioversion N/A 04/26/2014    Procedure: TRANSESOPHAGEAL ECHOCARDIOGRAM (TEE);  Surgeon: Loreli Slot, MD;  Location: [facility] OR;  Service: Open Heart Surgery;  Laterality: N/A;  . Esophagogastroduodenoscopy N/A 07/04/2014    Procedure: ESOPHAGOGASTRODUODENOSCOPY (EGD);  Surgeon: Jeani Hawking, MD;  Location: [facility] ENDOSCOPY;  Service: Endoscopy;  Laterality: N/A;  . Cardiac catheterization  03/2014    Current Outpatient Rx  Name  Route  Sig  Dispense  Refill  . apixaban  (ELIQUIS) 2.5 MG TABS tablet   Oral   Take 2.5 mg by mouth daily.         Marland Kitchen aspirin 325 MG tablet   Oral   Take 325 mg by mouth daily.         Marland Kitchen donepezil (ARICEPT) 10 MG tablet   Oral   Take 1 tablet (10 mg total) by mouth at bedtime.   90 tablet   0   . furosemide (LASIX) 20 MG tablet   Oral   Take 1 tablet (20 mg total) by mouth daily.   90 tablet   3   . gabapentin (NEURONTIN) 400 MG capsule   Oral   Take 400 mg by mouth 3 (three) times daily.         . metoprolol tartrate (LOPRESSOR) 50 MG tablet   Oral   Take 1 tablet (50 mg total) by mouth 2 (two) times daily. Patient taking differently: Take 25 mg by mouth 2 (two) times daily. Takes 0.5 tablet two times daily   180 tablet   3   . omeprazole (PRILOSEC) 20 MG capsule   Oral   Take 1 capsule (20 mg total) by mouth daily.   90 capsule   3   . OxyCODONE (OXYCONTIN) 10 mg T12A 12 hr tablet   Oral   Take 10 mg by mouth every 12 (twelve) hours.         Marland Kitchen POTASSIUM CHLORIDE ER PO   Oral   Take 10 mEq by mouth daily.         . sertraline (ZOLOFT) 50 MG tablet   Oral   Take 1 tablet (50 mg total) by mouth daily.   90 tablet   0   . simvastatin (ZOCOR) 20 MG tablet   Oral   Take 20 mg by mouth daily.         . tamsulosin (FLOMAX) 0.4 MG CAPS capsule   Oral   Take 1 capsule (0.4 mg total) by mouth daily.   90 capsule   3   . traMADol (ULTRAM) 50 MG tablet   Oral   Take by mouth 3 (three) times daily as needed.         . Venlafaxine HCl 150 MG TB24   Oral   Take 1 tablet (150 mg total) by mouth daily.   90 each   0     Allergies Review of patient's allergies indicates no known allergies.  Family History  Problem Relation Age of Onset  . Alcohol abuse Mother   . Heart disease Mother   . Diabetes Mother   . Heart disease Father   . Diabetes Father   . Cancer Mother   . Cancer Father   . Alcohol abuse Father   . Alcohol abuse Brother     Social History Social History   Substance Use Topics  . Smoking status: Never Smoker   . Smokeless tobacco: Never Used  . Alcohol Use: No    Review of Systems Constitutional:  No fever/chills Eyes: No visual changes. ENT: No sore throat. Cardiovascular: Denies chest pain. Respiratory: Denies shortness of breath. Gastrointestinal: No abdominal pain.  No nausea, no vomiting.  No diarrhea.  No constipation. Genitourinary: Negative for dysuria. Musculoskeletal: Negative for back pain. Skin: Negative for rash. Neurological: Negative for headaches, focal weakness or numbness.  10-point ROS otherwise negative.  ____________________________________________   PHYSICAL EXAM:  VITAL SIGNS: ED Triage Vitals  Enc Vitals Group     BP 12/23/14 1229 149/104 mmHg     Pulse Rate 12/23/14 1229 116     Resp 12/23/14 1229 20     Temp 12/23/14 1229 97.5 F (36.4 C)     Temp Source 12/23/14 1229 Oral     SpO2 12/23/14 1229 100 %     Weight 12/23/14 1229 240 lb (108.863 kg)     Height 12/23/14 1229 5\' 11"  (1.803 m)     Head Cir --      Peak Flow --      Pain Score 12/23/14 1230 5     Pain Loc --      Pain Edu? --      Excl. in GC? --     Constitutional: Alert and oriented. Well appearing and in no acute distress. Eyes: Conjunctivae are normal. PERRL. EOMI. Head: Atraumatic. Nose: No congestion/rhinnorhea. Mouth/Throat: Mucous membranes are moist.  Oropharynx non-erythematous. Neck: No stridor.  No tenderness to palpation. Cardiovascular: Tachycardic, regular rhythm. Grossly normal heart sounds.  Good peripheral circulation. Respiratory: Normal respiratory effort.  No retractions. Lungs CTAB. Gastrointestinal: Soft and nontender. No distention. No abdominal bruits. No CVA tenderness. Musculoskeletal: Left lower extremity edema which is mild to moderate. Mild tenderness to palpation over the left hip. There is no redness or induration here. No tenderness palpation over the T or L spines. He has bilateral and intact  dorsalis pedis pulses. Neurologic:  Normal speech and language. No gross focal neurologic deficits are appreciated.  Skin:  Skin is warm, dry and intact. No rash noted. Psychiatric: Mood and affect are normal. Speech and behavior are normal.  ____________________________________________   LABS (all labs ordered are listed, but only abnormal results are displayed)  Labs Reviewed  CBC WITH DIFFERENTIAL/PLATELET - Abnormal; Notable for the following:    Hemoglobin 12.6 (*)    HCT 39.0 (*)    RDW 17.1 (*)    Lymphs Abs 0.9 (*)    All other components within normal limits  COMPREHENSIVE METABOLIC PANEL - Abnormal; Notable for the following:    Glucose, Bld 122 (*)    All other components within normal limits  URINALYSIS COMPLETEWITH MICROSCOPIC ([facility] ONLY) - Abnormal; Notable for the following:    Color, Urine YELLOW (*)    APPearance CLEAR (*)    Ketones, ur 2+ (*)    Squamous Epithelial / LPF 0-5 (*)    All other components within normal limits  TROPONIN I   ____________________________________________  EKG  ED ECG REPORT I, Arelia Longest, the attending physician, personally viewed and interpreted this ECG.   Date: 12/23/2014  EKG Time: 1239  Rate: 116  Rhythm: sinus tachycardia  Intervals:right bundle branch block  ST&T Change: No ST elevations or depressions. T-wave inversions in V2.  Nose significant change from 07/05/2014.  ____________________________________________  RADIOLOGY  Ultrasound of the lower extremity without evidence of DVT. No acute findings on the CAT scan of the brain. No other acute findings on radiography. ____________________________________________   PROCEDURES    ____________________________________________   INITIAL  IMPRESSION / ASSESSMENT AND PLAN / ED COURSE  Pertinent labs & imaging results that were available during my care of the patient were reviewed by me and considered in my medical decision making (see chart for  details).  ----------------------------------------- 4:31 PM on 12/23/2014 -----------------------------------------  Was able to assist patient out of bed and he was able to ambulate with my assistance. He does use a walker and a cane at his baseline. He denies any shortness of breath or chest pain whenever walking. He did not have any lightheadedness or dizziness while walking. He has seen Dr. Kerin Salen in the past and I discussed the case with Dr. Macon Large who will give his name to the office to work him in for follow-up in the next 2-3 days. The patient said that he would be able to make an appointment with cardiology in several days from now. As not a primary care doctor at this time. He told me that he "fired" his last of her primary care doctors because they would not doses oxycodone like it had been before he moved to this area. I told him I'll give him the phone number for the [facility] where he could follow-up with a primary care doctor. I also ordered a social work consult for this patient to arrange home health. Patient is persistently tachycardic but without any chest pain or shortness of breath. Currently getting fluids. I believe this may be related to dehydration as he has 2+ ketones in his urine. As long as his heart rate normalized with fluids he should be appropriate for discharge to home. Signed out to Dr. Fanny Bien. ____________________________________________   FINAL CLINICAL IMPRESSION(S) / ED DIAGNOSES  Acute tachycardia. Acute generalized weakness.    Myrna Blazer, MD 12/23/14 [phone]

## 2014-12-23 NOTE — ED Notes (Signed)
Pt states multiple falls in the past few days.  Lives at home and has no one to take care of him.  Pt very disheveled.

## 2014-12-23 NOTE — ED Notes (Signed)
Pt c/o dizziness for the past month, states he has fallen 3 times in the past couple of days and is having pain to the left hip and left rib area..states he has been incontinent of urine..states he lives alone.Marland Kitchen

## 2014-12-23 NOTE — Progress Notes (Signed)
LCSW met with patient. He reports he has no wallet or phone book with him so he cant arrange a ride. He reports he is unable to walk at this time. He is agreeable to get some in home help and has insurance.

## 2014-12-24 LAB — BASIC METABOLIC PANEL
Anion gap: 4 — ABNORMAL LOW (ref 5–15)
BUN: 17 mg/dL (ref 6–20)
CALCIUM: 8.4 mg/dL — AB (ref 8.9–10.3)
CO2: 28 mmol/L (ref 22–32)
CREATININE: 1.04 mg/dL (ref 0.61–1.24)
Chloride: 107 mmol/L (ref 101–111)
GFR calc non Af Amer: >60 mL/min (ref >60)
Glucose, Bld: 136 mg/dL — ABNORMAL HIGH (ref 65–99)
Potassium: 3.7 mmol/L (ref 3.5–5.1)
SODIUM: 139 mmol/L (ref 135–145)

## 2014-12-24 LAB — CBC
HCT: 33.1 % — ABNORMAL LOW (ref 40.0–52.0)
Hemoglobin: 10.9 g/dL — ABNORMAL LOW (ref 13.0–18.0)
MCH: 28.4 pg (ref 26.0–34.0)
MCHC: 33 g/dL (ref 32.0–36.0)
MCV: 86.1 fL (ref 80.0–100.0)
PLATELETS: 195 10*3/uL (ref 150–440)
RBC: 3.84 MIL/uL — AB (ref 4.40–5.90)
RDW: 17.2 % — AB (ref 11.5–14.5)
WBC: 6.1 10*3/uL (ref 3.8–10.6)

## 2014-12-24 MED ORDER — ERYTHROMYCIN 5 MG/GM OP OINT
TOPICAL_OINTMENT | Freq: Four times a day (QID) | OPHTHALMIC | Status: DC
  Filled 2014-12-24: qty 3.5

## 2014-12-24 MED ORDER — ERYTHROMYCIN 5 MG/GM OP OINT: TOPICAL_OINTMENT | Freq: Four times a day (QID) | OPHTHALMIC | Status: DC

## 2014-12-24 NOTE — Discharge Summary (Signed)
[facility] - [place] at [facility]   PATIENT NAME: Peter Weiss    MR#:  [id number]  DATE OF BIRTH:  1940-05-30  DATE OF ADMISSION:  12/23/2014 ADMITTING PHYSICIAN: Wyatt Haste, MD  DATE OF DISCHARGE: 12/24/2014 PRIMARY CARE PHYSICIAN: Barbette Reichmann, MD    ADMISSION DIAGNOSIS:  Lightheadedness [R42] Tachycardia [R00.0] Rib pain [R07.81] Fall, initial encounter [W19.XXXA]   DISCHARGE DIAGNOSIS:  Multiple falls  SECONDARY DIAGNOSIS:   Past Medical History  Diagnosis Date  . Arthritis   . Chicken pox   . Heart murmur   . Hypertension   . Hyperlipidemia   . Urinary incontinence   . History of fainting spells of unknown cause   . Diabetes mellitus without complication (HCC)   . Depression   . Aortic stenosis   . Neuromuscular disorder [facility])     HOSPITAL COURSE:   1. Multiple falls: Multifactorial, somewhat orthostatic given heart rate and symptoms, he has been treated with IV fluid hydration. But orthostatic blood pressures didn't show any orthostatic hypotension. Physical therapy evaluation suggests that the patient need home health and PT.  2. Essential hypertension: on Lopressor.. 3. Right eye conjunctivitis. Erythema ointment.   DISCHARGE CONDITIONS:   Stable, discharge to home with home health and PT today.  CONSULTS OBTAINED:  Treatment Team:  Wyatt Haste, MD Shaune Pollack, MD  DRUG ALLERGIES:  No Known Allergies  DISCHARGE MEDICATIONS:   Current Discharge Medication List    START taking these medications   Details  erythromycin ophthalmic ointment Place into the right eye 4 (four) times daily. For 7 days Qty: 3.5 g, Refills: 0      CONTINUE these medications which have NOT CHANGED   Details  donepezil (ARICEPT) 10 MG tablet Take 1 tablet (10 mg total) by mouth at bedtime. Qty: 90 tablet, Refills: 0    furosemide (LASIX) 20 MG tablet Take 1 tablet (20 mg total) by mouth daily. Qty: 90 tablet, Refills: 3     gabapentin (NEURONTIN) 400 MG capsule Take 400 mg by mouth 3 (three) times daily.    metoprolol tartrate (LOPRESSOR) 25 MG tablet Take 12.5 mg by mouth 2 (two) times daily.     omeprazole (PRILOSEC) 20 MG capsule Take 1 capsule (20 mg total) by mouth daily. Qty: 90 capsule, Refills: 3    Oxycodone HCl 10 MG TABS Take 10 mg by mouth every 6 (six) hours as needed (for pain).    potassium chloride (K-DUR) 10 MEQ tablet Take 10 mEq by mouth daily.    sertraline (ZOLOFT) 50 MG tablet Take 1 tablet (50 mg total) by mouth daily. Qty: 90 tablet, Refills: 0    simvastatin (ZOCOR) 20 MG tablet Take 20 mg by mouth at bedtime.     tamsulosin (FLOMAX) 0.4 MG CAPS capsule Take 1 capsule (0.4 mg total) by mouth daily. Qty: 90 capsule, Refills: 3    venlafaxine XR (EFFEXOR-XR) 150 MG 24 hr capsule Take 150 mg by mouth daily.         DISCHARGE INSTRUCTIONS:    If you experience worsening of your admission symptoms, develop shortness of breath, life threatening emergency, suicidal or homicidal thoughts you must seek medical attention immediately by calling 911 or calling your MD immediately  if symptoms less severe.  You Must read complete instructions/literature along with all the possible adverse reactions/side effects for all the Medicines you take and that have been prescribed to you. Take any new Medicines after you have completely understood and accept  all the possible adverse reactions/side effects.   Please note  You were cared for by a hospitalist during your hospital stay. If you have any questions about your discharge medications or the care you received while you were in the hospital after you are discharged, you can call the unit and asked to speak with the hospitalist on call if the hospitalist that took care of you is not available. Once you are discharged, your primary care physician will handle any further medical issues. Please note that NO REFILLS for any discharge medications  will be authorized once you are discharged, as it is imperative that you return to your primary care physician (or establish a relationship with a primary care physician if you do not have one) for your aftercare needs so that they can reassess your need for medications and monitor your lab values.    Today   SUBJECTIVE   Dizzy sometimes and body pain all over.  He wanted OxyContin.   VITAL SIGNS:  Blood pressure 128/81, pulse 113, temperature 98 F (36.7 C), temperature source Oral, resp. rate 19, height 5\' 11"  (1.803 m), weight 109.952 kg (242 lb 6.4 oz), SpO2 97 %.  I/O:   Intake/Output Summary (Last 24 hours) at 12/24/14 1549 Last data filed at 12/24/14 1130  Gross per 24 hour  Intake    360 ml  Output    995 ml  Net   -635 ml    PHYSICAL EXAMINATION:  GENERAL:  74 y.o.-year-old patient lying in the bed with no acute distress.  EYES: Pupils equal, round, reactive to light and accommodation. No scleral icterus. Extraocular muscles intact. Right eye is pink. HEENT: Head atraumatic, normocephalic. Oropharynx and nasopharynx clear.  NECK:  Supple, no jugular venous distention. No thyroid enlargement, no tenderness.  LUNGS: Normal breath sounds bilaterally, no wheezing, rales,rhonchi or crepitation. No use of accessory muscles of respiration.  CARDIOVASCULAR: S1, S2 normal. No murmurs, rubs, or gallops.  ABDOMEN: Soft, non-tender, non-distended. Bowel sounds present. No organomegaly or mass.  EXTREMITIES: No pedal edema, cyanosis, or clubbing.  NEUROLOGIC: Cranial nerves II through XII are intact. Muscle strength 4/5 in all extremities. Sensation intact. Gait not checked.  PSYCHIATRIC: The patient is alert and oriented x 3.  SKIN: No obvious rash, lesion, or ulcer.   DATA REVIEW:   CBC  Recent Labs Lab 12/24/14 0645  WBC 6.1  HGB 10.9*  HCT 33.1*  PLT 195    Chemistries   Recent Labs Lab 12/23/14 1346 12/24/14 0645  NA 140 139  K 3.9 3.7  CL 104 107  CO2 29  28  GLUCOSE 122* 136*  BUN 17 17  CREATININE 0.97 1.04  CALCIUM 9.2 8.4*  AST 24  --   ALT 20  --   ALKPHOS 73  --   BILITOT 0.9  --     Cardiac Enzymes  Recent Labs Lab 12/23/14 1346  TROPONINI 0.03    Microbiology Results  Results for orders placed or performed during the hospital encounter of 07/04/14  Urine culture     Status: None   Collection Time: 07/04/14 10:03 AM  Result Value Ref Range Status   Specimen Description URINE, CLEAN CATCH  Final   Special Requests NONE  Final   Colony Count NO GROWTH Performed at [facility]   Final   Culture NO GROWTH Performed at [facility]   Final   Report Status 07/05/2014 FINAL  Final  MRSA PCR Screening  Status: None   Collection Time: 07/04/14  1:00 PM  Result Value Ref Range Status   MRSA by PCR NEGATIVE NEGATIVE Final    Comment:        The GeneXpert MRSA Assay (FDA approved for NASAL specimens only), is one component of a comprehensive MRSA colonization surveillance program. It is not intended to diagnose MRSA infection nor to guide or monitor treatment for MRSA infections.   Clostridium Difficile by PCR     Status: None   Collection Time: 07/04/14  9:18 PM  Result Value Ref Range Status   Toxigenic C Difficile by pcr NEGATIVE NEGATIVE Final    RADIOLOGY:  Dg Chest 1 View  12/23/2014   CLINICAL DATA:  Patient fell on left side yesterday with left posterior chest pain and dizziness  EXAM: CHEST 1 VIEW  COMPARISON:  07/04/2014  FINDINGS: Mild cardiac enlargement stable as is left atrial appendage clip. Status post median sternotomy. Stable aortic calcification.  Vascular pattern is normal. No consolidation effusion or pneumothorax. Osseous thorax appears intact.  IMPRESSION: No active disease.   Electronically Signed   By: Esperanza Heir M.D.   On: 12/23/2014 14:59   Dg Ribs Unilateral Left  12/23/2014   CLINICAL DATA:  Status post fall on left side, left-sided posterior chest pain and  dizziness.  EXAM: LEFT RIBS - 2 VIEW  COMPARISON:  Chest x-ray dated 07/04/2014.  FINDINGS: BB placed on the soft tissues at the site of patient's left-sided pain. No underlying rib fracture or dislocation identified. All ribs appear intact and well aligned. No focal cortical irregularity or osseous lesion. The underlying left lung appears clear. Soft tissues about the left chest are unremarkable.  IMPRESSION: No rib fracture or dislocation seen.   Electronically Signed   By: Bary Richard M.D.   On: 12/23/2014 15:01   Ct Head Wo Contrast  12/23/2014   CLINICAL DATA:  Dizziness for 1 month, 3 falls in the past several days, urinary incontinence  EXAM: CT HEAD WITHOUT CONTRAST  TECHNIQUE: Contiguous axial images were obtained from the base of the skull through the vertex without intravenous contrast.  COMPARISON:  03/19/2014  FINDINGS: Atrophy diffusely with low attenuation in the deep white matter, most prominent focally in the anterior left parietal lobe. This is unchanged from 03/19/2014. No evidence of acute vascular territory infarct. There is no hemorrhage or extra-axial fluid. There is no hydrocephalus. The calvarium is intact. No significant opacities appreciated in the visualized portions of the paranasal sinuses.  IMPRESSION: Chronic involutional change with no acute findings   Electronically Signed   By: Esperanza Heir M.D.   On: 12/23/2014 14:57   US Venous Img Lower Unilateral Left  12/23/2014   CLINICAL DATA:  Left lower extremity pain and swelling for 3 days  EXAM: LEFT LOWER EXTREMITY VENOUS DUPLEX ULTRASOUND  TECHNIQUE: Doppler venous assessment of the left lower extremity deep venous system was performed, including characterization of spectral flow, compressibility, and phasicity.  COMPARISON:  None.  FINDINGS: There is complete compressibility of the left common femoral, femoral, and popliteal veins.  Doppler analysis demonstrates respiratory phasicity and augmentation of flow upon calf  compression.  IMPRESSION: No evidence of DVT in the left lower extremity.   Electronically Signed   By: Jolaine Click M.D.   On: 12/23/2014 15:20   Dg Hip Unilat With Pelvis 2-3 Views Left  12/23/2014   CLINICAL DATA:  Larey Seat several times yesterday hitting the left side, the patient is on blood thinner  EXAM: DG HIP (WITH OR WITHOUT PELVIS) 2-3V LEFT  COMPARISON:  None.  FINDINGS: For age there is only mild degenerative joint disease of the hips present. No acute hip fracture is seen. The pelvic rami are intact. The bones are diffusely osteopenic. There are degenerative changes the lower lumbar spine. The SI joints appear corticated.  IMPRESSION: No acute fracture. Only mild degenerative change of the hips for age. Osteopenia.   Electronically Signed   By: Dwyane Dee M.D.   On: 12/23/2014 15:00        Management plans discussed with the patient, family and they are in agreement.  CODE STATUS:     Code Status Orders        Start     Ordered   12/23/14 2030  Full code   Continuous     12/23/14 2030      TOTAL TIME TAKING CARE OF THIS PATIENT: 37 minutes.    Shaune Pollack M.D on 12/24/2014 at 3:49 PM  Between 7am to 6pm - Pager - [phone]  After 6pm go to [email or web address] - password [email or web address] [facility]  [place] [facility]  Office  [phone]  CC: Primary care physician; Barbette Reichmann, MD

## 2014-12-24 NOTE — Clinical Social Work Note (Addendum)
Clinical Social Work Assessment  Patient Details  Name: Peter Weiss MRN: [id number] Date of Birth: 1941-01-12  Date of referral:  12/24/14               Reason for consult:  Care Management Concerns, Community Resources                Permission sought to share information with:  Case Manager Permission granted to share information::  Yes, Verbal Permission Granted  Name::      Actor::     Relationship::     Contact Information:     Housing/Transportation Living arrangements for the past 2 months:  Mobile Home Source of Information:  Patient Patient Interpreter Needed:  None Criminal Activity/Legal Involvement Pertinent to Current Situation/Hospitalization:  No - Comment as needed Significant Relationships:  Neighbor Lives with:  Self Do you feel safe going back to the place where you live?  Yes Need for family participation in patient care:  No (Coment)  Care giving concerns: Patient lives alone in [place], [place].    Social Worker assessment / plan: Holiday representative (CSW) received consult for home health. PT is recommending home health with 24 hour assistance. CSW met with patient to discuss D/C plan. Patient was alert and oriented and sitting up in the chair. CSW introduced self and explained role of CSW department. Patient prefers to go by Peter Weiss instead of [employer]. Per patient he did not know his first name was Peter Weiss until he was 46 and saw his birth certificate. Patient reported that he lives alone in the [facility] [place]. Patient reported that he receives $1,530 per month in Social Security. Patient has 2 children in their 38's that live in [place]. Patient also has 3 grandchildren that live in [place]. Patient reported that he has access to food, water and medications. Patient reported that he recently purchased a car for $1,000 and has not gotten the title yet. Patient reported that he person he purchased the car from is avoiding  home. CSW encouraged patient to make a police report about the car. Patient reported that he would make a police report. Patient reported that he has 2 dogs at home and neighbors that help him walk the dogs when he is not home. Patient reported that he has home phone that works. Per patient he did have a roommate that was a friend he was helping get back on his feet. Per patient his friend moved out 2 weeks ago.   CSW provided patient with community resources including [address] and [employer] [facility] [place].  Employment status:  Disabled (Comment on whether or not currently receiving Disability), Retired Nurse, adult PT Recommendations:  Home with [place], [place] / Referral to community resources:  Other (Comment Required) Librarian, academic )  Patient/Family's Response to care:  Patient is agreeable to going home today.   Patient/Family's Understanding of and Emotional Response to Diagnosis, Current Treatment, and Prognosis: Patient was pleasant throughout assessment and thanked CSW for visit.   Emotional Assessment Appearance:  Appears stated age Attitude/Demeanor/Rapport:    Affect (typically observed):  Accepting, Adaptable, Pleasant Orientation:  Oriented to Self, Oriented to Place, Oriented to  Time, Oriented to Situation Alcohol / Substance use:  Not Applicable Psych involvement (Current and /or in the community):  No (Comment)  Discharge Needs  Concerns to be addressed:  Discharge Planning Concerns Readmission within the last 30 days:  No  Current discharge risk:  Chronically ill Barriers to Discharge:  No Barriers Identified   Peter Weiss 12/24/2014, 2:55 PM

## 2014-12-24 NOTE — Evaluation (Signed)
Physical Therapy Evaluation Patient Details Name: Peter Weiss MRN: [id number] DOB: 04/24/1940 Today's Date: 12/24/2014   History of Present Illness  Pt is a 74 y.o. male presenting to hospital with frequent falls (3 in last week) and tachycardic.  Pt reporting dizziness/lightheadedness with standing and prolonged standing.  Imaging of L hip and L ribs negative for fx.  CT of head negative for acute findings.  L LE doppler negative for DVT.  PMH includes Bovine AVR placement 04/2014 by Dr. Dorris Fetch, DM, HTN, HLD.  Hospitalization about 5 months ago d/t hemorrhagic shock with GIB.    Clinical Impression  Currently pt demonstrates impairments with balance, general strength, safety awareness and memory, and limitations with functional mobility.  Prior to admission, pt was modified independent ambulating with SPC.  Pt lives alone in 1 level home with 3 steps to enter without railing.  Currently pt is CGA with transfers and ambulation 130 feet with RW (pt requiring RW to decrease c/o L hip pain).  Pt did c/o dizziness intermittently with standing and ambulation although no loss of balance noted during session.  Orthostatic vitals taken but no significant systolic BP change or HR change noted (nursing notified; see flowsheet for details).   Pt would benefit from skilled PT to address above noted impairments and functional limitations.  Recommend pt discharge to home with 24/7 supervision/assist for safety (d/t intermittent dizziness and decreased safety awareness and memory) and use of RW when medically appropriate.     Follow Up Recommendations Home health PT;Supervision/Assistance - 24 hour    Equipment Recommendations  Rolling walker with 5" wheels    Recommendations for Other Services       Precautions / Restrictions Precautions Precautions: Fall Restrictions Weight Bearing Restrictions: No      Mobility  Bed Mobility Overal bed mobility: Modified Independent              General bed mobility comments: HOB elevated; increased time to perform (pt normally sleeps in recliner chair)  Transfers Overall transfer level: Needs assistance Equipment used: Rolling walker (2 wheeled) Transfers: Sit to/from Stand Sit to Stand: Min guard         General transfer comment: increased effort to stand but steady without loss of balance  Ambulation/Gait Ambulation/Gait assistance: Min guard (SBA for safety d/t pt reporting frequent falls d/t dizziness) Ambulation Distance (Feet): 130 Feet Assistive device: Rolling walker (2 wheeled) Gait Pattern/deviations: Step-through pattern Gait velocity: decreased   General Gait Details: decreased B step length/foot clearance/heelstrike but steady without loss of balance  Stairs            Wheelchair Mobility    Modified Rankin (Stroke Patients Only)       Balance Overall balance assessment: History of Falls                                           Pertinent Vitals/Pain Pain Assessment: 0-10 Pain Score: 8  Pain Location: L hip Pain Descriptors / Indicators: Tender;Sore Pain Intervention(s): Limited activity within patient's tolerance;Monitored during session;Premedicated before session;Repositioned    Home Living Family/patient expects to be discharged to:: Private residence Living Arrangements: Alone   Type of Home: Mobile home Home Access: Stairs to enter Entrance Stairs-Rails: None Entrance Stairs-Number of Steps: 3 Home Layout: One level Home Equipment: Cane - single point (Pt reports he no longer has RW)  Prior Function Level of Independence: Independent with assistive device(s)         Comments: pt states he is walking with 1 cane     Hand Dominance        Extremity/Trunk Assessment   Upper Extremity Assessment: Generalized weakness           Lower Extremity Assessment: Generalized weakness         Communication   Communication: No difficulties   Cognition Arousal/Alertness: Awake/alert Behavior During Therapy: Impulsive Overall Cognitive Status: No family/caregiver present to determine baseline cognitive functioning (pt appearing oriented but verbalizes innapropriate comments)       Memory: Decreased recall of precautions;Decreased short-term memory (pt with difficulty remembering statements from earlier in day and earlier in PT session)              General Comments   Nursing cleared pt for participation in physical therapy.  Pt agreeable to PT session.    Exercises        Assessment/Plan    PT Assessment Patient needs continued PT services  PT Diagnosis Difficulty walking;Generalized weakness   PT Problem List Decreased strength;Decreased activity tolerance;Decreased balance;Decreased mobility;Decreased safety awareness;Decreased knowledge of precautions;Pain  PT Treatment Interventions DME instruction;Gait training;Stair training;Functional mobility training;Therapeutic activities;Therapeutic exercise;Balance training;Patient/family education   PT Goals (Current goals can be found in the Care Plan section) Acute Rehab PT Goals Patient Stated Goal: To go home PT Goal Formulation: With patient Time For Goal Achievement: 01/07/15 Potential to Achieve Goals: Fair    Frequency Min 2X/week   Barriers to discharge Decreased caregiver support      Co-evaluation               End of Session Equipment Utilized During Treatment: Gait belt Activity Tolerance: Patient tolerated treatment well Patient left: in chair;with call bell/phone within reach;with chair alarm set Nurse Communication: Mobility status    Functional Assessment Tool Used: AM-PAC without stairs Functional Limitation: Mobility: Walking and moving around Mobility: Walking and Moving Around Current Status [phone]): At least 40 percent but less than 60 percent impaired, limited or restricted Mobility: Walking and Moving Around Goal Status  [phone]): At least 1 percent but less than 20 percent impaired, limited or restricted    Time: [phone] PT Time Calculation (min) (ACUTE ONLY): 40 min   Charges:   PT Evaluation $Initial PT Evaluation Tier I: 1 Procedure PT Treatments $Therapeutic Activity: 8-22 mins   PT G Codes:   PT G-Codes **NOT FOR INPATIENT CLASS** Functional Assessment Tool Used: AM-PAC without stairs Functional Limitation: Mobility: Walking and moving around Mobility: Walking and Moving Around Current Status ([id number]): At least 40 percent but less than 60 percent impaired, limited or restricted Mobility: Walking and Moving Around Goal Status [phone]): At least 1 percent but less than 20 percent impaired, limited or restricted    [facility] 12/24/2014, 11:19 AM Hendricks Limes, PT [phone]

## 2014-12-24 NOTE — Care Management Note (Addendum)
Case Management Note  Patient Details  Name: Peter Weiss MRN: [id number] Date of Birth: 02-Mar-1941  Subjective/Objective:                  Social work consult needed as patient states that "something is happening to his money". He feels that someone is stealing his money. He lives alone [place] but has "neighbors that help him". He has friends named Soil scientist and [place] [phone]. Rolling walker needed. 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 He states "he makes good money" but then stated that he has thousands of dollars owed in medical expenses and cannot pay them. He says he "has runny water/electricity next door at one of his other trailers but not in the one he lives in". He states that "his trailer is a mess but just had the walls painted". His neighbor told Fredric Mare CSW that he is "buying prescription medicine from random people". Patient [place] [facility] [place]. He states he "bought a car for $1,000- [employer]- but the guy didn't give him the title and he doesn't remember the guys name".   Action/Plan: Home health arranged through [facility] but they cannot go out until PCP has been established. He has an appointment with Dr. Einar Crow in November at [facility]. Dan Humphreys was requested from Will with [facility] and delivered to patient. No further RNCM needs.  Expected Discharge Date:                  Expected Discharge Plan:     In-House Referral:  Clinical Social Work  Discharge planning Services  CM Consult  Post Acute Care Choice:  Home Health, Durable Medical Equipment Choice offered to:  Patient  DME Arranged:  Walker rolling DME Agency:     HH Arranged:  PT, RN HH Agency:  [facility]  Status of Service:  In process, will continue to follow  Medicare Important Message Given:    Date Medicare IM Given:    Medicare IM give  by:    Date Additional Medicare IM Given:    Additional Medicare Important Message give by:     If discussed at Long Length of Stay Meetings, dates discussed:    Additional Comments:  Collie Siad, RN 12/24/2014, 12:26 PM

## 2014-12-25 LAB — VITAMIN D 25 HYDROXY (VIT D DEFICIENCY, FRACTURES): Vit D, 25-Hydroxy: 21.5 ng/mL — ABNORMAL LOW (ref 30.0–100.0)

## 2014-12-31 ENCOUNTER — Ambulatory Visit (INDEPENDENT_AMBULATORY_CARE_PROVIDER_SITE_OTHER): Payer: Medicare Other | Admitting: Cardiovascular Disease

## 2014-12-31 ENCOUNTER — Encounter: Payer: Self-pay | Admitting: Cardiovascular Disease

## 2014-12-31 VITALS — BP 148/92 | HR 92 | Ht 71.0 in | Wt 251.8 lb

## 2014-12-31 DIAGNOSIS — I959 Hypotension, unspecified: Secondary | ICD-10-CM

## 2014-12-31 DIAGNOSIS — G8929 Other chronic pain: Secondary | ICD-10-CM

## 2014-12-31 DIAGNOSIS — R296 Repeated falls: Secondary | ICD-10-CM

## 2014-12-31 DIAGNOSIS — I10 Essential (primary) hypertension: Secondary | ICD-10-CM

## 2014-12-31 DIAGNOSIS — I350 Nonrheumatic aortic (valve) stenosis: Secondary | ICD-10-CM

## 2014-12-31 DIAGNOSIS — Z954 Presence of other heart-valve replacement: Secondary | ICD-10-CM

## 2014-12-31 DIAGNOSIS — M549 Dorsalgia, unspecified: Secondary | ICD-10-CM

## 2014-12-31 DIAGNOSIS — Z952 Presence of prosthetic heart valve: Secondary | ICD-10-CM

## 2014-12-31 DIAGNOSIS — I4891 Unspecified atrial fibrillation: Secondary | ICD-10-CM

## 2014-12-31 NOTE — Patient Instructions (Addendum)
You are doing well. No medication changes were made.  You already have an appt sched w/ Dr. Dareen PianoAnderson 11/11 @ 8:30 His office said that if you are in need of pain pills before that time, you may go to their walk in clinic  Please call us if you have new issues that need to be addressed before your next appt.  Your physician wants you to follow-up in: 6 months.  You will receive a reminder letter in the mail two months in advance. If you don't receive a letter, please call our office to schedule the follow-up appointment.

## 2014-12-31 NOTE — Assessment & Plan Note (Signed)
Repeat echocardiogram ordered for next year, routine surveillance

## 2014-12-31 NOTE — Progress Notes (Signed)
Patient ID: Peter Weiss, male    DOB: 03-Jul-1940, 74 y.o.   MRN: [id number]  HPI Comments: [name] is a 74 year old gentleman with history of severe aortic valve stenosis, persistent atrial fibrillation, status post AVR,GI bleed who presents for routine follow-up of his atrial fibrillation and aVR  In follow-up today, he is requesting pain medication. He reports that he declined intervention by pain clinic because he did not know if it would work.  He has still not seen primary care He is requesting 10 mg pain pills. He reports that he is able to buy them on the street for one dollar per milligram. In the past he has spent $100 on 10 pills Previously was on a higher dose, 20 mg. He is requesting 30 mg. He has been seen by pain clinic,  Given underlying dementia, no pain medication was given  Orthostatics today show supine pressure 150/87, down to 120s with standing. Minimal climb in heart rate Recently in the hospital for falls, dizziness. Legs are very weak today, uses a cane to stand. Very shaky with standing for his orthostatics.  EKG on today's visit shows atrial fibrillation with ventricular rate 92 bpm, right bundle branch block  Other past medical history  Previous admission [facility] after he was admitted for atrial fibrillation, shortness of breath. Workup at that time confirmed aortic valve stenosis.  several long hospital courses [facility] for aortic valve replacement with bioprosthetic valve, Follow-up hospitalization for GI bleeding after he was on anticoagulation for atrial fibrillation.   No Known Allergies  Current Outpatient Prescriptions on File Prior to Visit  Medication Sig Dispense Refill  . donepezil (ARICEPT) 10 MG tablet Take 1 tablet (10 mg total) by mouth at bedtime. 90 tablet 0  . erythromycin ophthalmic ointment Place into the right eye 4 (four) times daily. For 7 days 3.5 g 0  . furosemide (LASIX) 20 MG tablet Take 1 tablet (20 mg total) by mouth  daily. 90 tablet 3  . gabapentin (NEURONTIN) 400 MG capsule Take 400 mg by mouth 3 (three) times daily.    . metoprolol tartrate (LOPRESSOR) 25 MG tablet Take 12.5 mg by mouth 2 (two) times daily.     Marland Kitchen. omeprazole (PRILOSEC) 20 MG capsule Take 1 capsule (20 mg total) by mouth daily. 90 capsule 3  . Oxycodone HCl 10 MG TABS Take 10 mg by mouth every 6 (six) hours as needed (for pain).    . potassium chloride (K-DUR) 10 MEQ tablet Take 10 mEq by mouth daily.    . sertraline (ZOLOFT) 50 MG tablet Take 1 tablet (50 mg total) by mouth daily. 90 tablet 0  . simvastatin (ZOCOR) 20 MG tablet Take 20 mg by mouth at bedtime.     . tamsulosin (FLOMAX) 0.4 MG CAPS capsule Take 1 capsule (0.4 mg total) by mouth daily. 90 capsule 3  . venlafaxine XR (EFFEXOR-XR) 150 MG 24 hr capsule Take 150 mg by mouth daily.     No current facility-administered medications on file prior to visit.    Past Medical History  Diagnosis Date  . Arthritis   . Chicken pox   . Heart murmur   . Hypertension   . Hyperlipidemia   . Urinary incontinence   . History of fainting spells of unknown cause   . Diabetes mellitus without complication (HCC)   . Depression   . Aortic stenosis   . Neuromuscular disorder [facility](HCC)     Past Surgical History  Procedure Laterality Date  .  Goiter  2008  . Multiple extractions with alveoloplasty N/A 04/24/2014    Procedure: Extraction of tooth #'s 2,3,5,12,13,14,15,20 with allveoloplasty and gross debridement of remaining dentition and sectioning of bridge at mesial of abutment tooth #32.;  Surgeon: Charlynne Pander, DDS;  Location: [facility] OR;  Service: Oral Surgery;  Laterality: N/A;  . Aortic valve replacement N/A 04/26/2014    Procedure: AORTIC VALVE REPLACEMENT (AVR);  Surgeon: Loreli Slot, MD;  Location: [facility] OR;  Service: Open Heart Surgery;  Laterality: N/A;  . Maze N/A 04/26/2014    Procedure: MAZE;  Surgeon: Loreli Slot, MD;  [facility] OR;  Service: Open Heart Surgery;   Laterality: N/A;  . Tee without cardioversion N/A 04/26/2014    Procedure: TRANSESOPHAGEAL ECHOCARDIOGRAM (TEE);  Surgeon: Loreli Slot, MD;  Location: [facility] OR;  Service: Open Heart Surgery;  Laterality: N/A;  . Esophagogastroduodenoscopy N/A 07/04/2014    Procedure: ESOPHAGOGASTRODUODENOSCOPY (EGD);  Surgeon: Jeani Hawking, MD;  Location[facility] ENDOSCOPY;  Service: Endoscopy;  Laterality: N/A;  . Cardiac catheterization  03/2014    Social History  reports that he has never smoked. He has never used smokeless tobacco. He reports that he drinks alcohol. He reports that he does not use illicit drugs.  Family History family history includes Alcohol abuse in his brother, father, and mother; Cancer in his father and mother; Diabetes in his father and mother; Heart disease in his father and mother.   Review of Systems  Respiratory: Negative.   Cardiovascular: Negative.   Gastrointestinal: Negative.   Musculoskeletal: Positive for gait problem.  Skin: Negative.   Neurological: Positive for dizziness and weakness.  Hematological: Negative.   Psychiatric/Behavioral: Negative.   All other systems reviewed and are negative.   BP 148/92 mmHg  Pulse 82  Ht  (1.803 m)  Wt 251 lb 12 oz (114.193 kg)  BMI 35.13 kg/m2   Physical Exam  Constitutional: He is oriented to person, place, and time. He appears well-developed and well-nourished.  HENT:  Head: Normocephalic.  Nose: Nose normal.  Mouth/Throat: Oropharynx is clear and moist.  Eyes: Conjunctivae are normal. Pupils are equal, round, and reactive to light.  Neck: Normal range of motion. Neck supple. No JVD present.  Cardiovascular: S1 normal, S2 normal, normal heart sounds and intact distal pulses.  An irregularly irregular rhythm present. Tachycardia present.  Exam reveals no gallop and no friction rub.   No murmur heard. Pulmonary/Chest: Effort normal and breath sounds normal. No respiratory distress. He has no wheezes. He has no  rales. He exhibits no tenderness.  Abdominal: Soft. Bowel sounds are normal. He exhibits no distension. There is no tenderness.  Musculoskeletal: Normal range of motion. He exhibits no edema or tenderness.  Lymphadenopathy:    He has no cervical adenopathy.  Neurological: He is alert and oriented to person, place, and time. Coordination normal.  Skin: Skin is warm and dry. No rash noted. No erythema.  Psychiatric: He has a normal mood and affect. His behavior is normal. Judgment and thought content normal.      Assessment and Plan   Nursing note and vitals reviewed.

## 2014-12-31 NOTE — Assessment & Plan Note (Signed)
Previously seen by pain clinic He has declined injections Has not set up any from her care follow-up He is requesting pain medications on today's visit. We have declined

## 2014-12-31 NOTE — Assessment & Plan Note (Signed)
Blood pressure stable on today's visit. No changes to his medications Digoxin could be added  For tachycardia if needed in the future He is relatively sedentary, immobile.

## 2014-12-31 NOTE — Assessment & Plan Note (Signed)
Status post AVR. He has recovered well. Repeat echocardiogram in 2017

## 2014-12-31 NOTE — Assessment & Plan Note (Signed)
Recommended he establish with primary care. He will need home PT for leg weakness, high risk of falls

## 2015-01-02 NOTE — Care Management (Signed)
Post discharge: I've received several calls from patient requesting assistance paying his electricity bills. I have not been able to speak with patient as I have left several messages on his cell phone. I checked with [facility] (knowing that patient has told me "he owns 3 trailors and kept the electricity bills in my name like at [employer]". HHRN noted that patient told her "he didn't know why they kept coming out as he does not need them". She has been unable to tell me if he has electricity due to not being about enter house". HHRN did not hearing a television and fan working in the background.

## 2015-02-11 ENCOUNTER — Emergency Department: Payer: Medicare Other

## 2015-02-11 ENCOUNTER — Emergency Department
Admission: EM | Admit: 2015-02-11 | Discharge: 2015-02-11 | Disposition: A | Discharge status: Home / Self Care | Payer: Medicare Other | Attending: Emergency Medicine | Admitting: Emergency Medicine

## 2015-02-11 ENCOUNTER — Encounter: Payer: Self-pay | Admitting: Emergency Medicine

## 2015-02-11 DIAGNOSIS — G8929 Other chronic pain: Secondary | ICD-10-CM | POA: Diagnosis not present

## 2015-02-11 DIAGNOSIS — R55 Syncope and collapse: Secondary | ICD-10-CM | POA: Diagnosis present

## 2015-02-11 DIAGNOSIS — Z79899 Other long term (current) drug therapy: Secondary | ICD-10-CM | POA: Insufficient documentation

## 2015-02-11 DIAGNOSIS — M549 Dorsalgia, unspecified: Secondary | ICD-10-CM | POA: Diagnosis not present

## 2015-02-11 DIAGNOSIS — R42 Dizziness and giddiness: Secondary | ICD-10-CM

## 2015-02-11 DIAGNOSIS — I10 Essential (primary) hypertension: Secondary | ICD-10-CM | POA: Diagnosis not present

## 2015-02-11 DIAGNOSIS — E1340 Other specified diabetes mellitus with diabetic neuropathy, unspecified: Secondary | ICD-10-CM | POA: Diagnosis not present

## 2015-02-11 LAB — URINALYSIS COMPLETE WITH MICROSCOPIC (ARMC ONLY)
BACTERIA UA: NONE SEEN
Bilirubin Urine: NEGATIVE
Glucose, UA: NEGATIVE mg/dL
HGB URINE DIPSTICK: NEGATIVE
KETONES UR: NEGATIVE mg/dL
LEUKOCYTES UA: NEGATIVE
NITRITE: NEGATIVE
PH: 5 (ref 5.0–8.0)
PROTEIN: NEGATIVE mg/dL
RBC / HPF: NONE SEEN RBC/hpf (ref 0–5)
SPECIFIC GRAVITY, URINE: 1.006 (ref 1.005–1.030)
SQUAMOUS EPITHELIAL / LPF: NONE SEEN
WBC UA: NONE SEEN WBC/hpf (ref 0–5)

## 2015-02-11 LAB — CBC
HEMATOCRIT: 36.4 % — AB (ref 40.0–52.0)
HEMOGLOBIN: 11.7 g/dL — AB (ref 13.0–18.0)
MCH: 28.1 pg (ref 26.0–34.0)
MCHC: 32.1 g/dL (ref 32.0–36.0)
MCV: 87.6 fL (ref 80.0–100.0)
Platelets: 172 10*3/uL (ref 150–440)
RBC: 4.15 MIL/uL — AB (ref 4.40–5.90)
RDW: 15.3 % — ABNORMAL HIGH (ref 11.5–14.5)
WBC: 5.6 10*3/uL (ref 3.8–10.6)

## 2015-02-11 LAB — BASIC METABOLIC PANEL
ANION GAP: 9 (ref 5–15)
BUN: 21 mg/dL — ABNORMAL HIGH (ref 6–20)
CHLORIDE: 102 mmol/L (ref 101–111)
CO2: 29 mmol/L (ref 22–32)
Calcium: 8.6 mg/dL — ABNORMAL LOW (ref 8.9–10.3)
Creatinine, Ser: 1.17 mg/dL (ref 0.61–1.24)
GFR calc Af Amer: >60 mL/min (ref >60)
Glucose, Bld: 143 mg/dL — ABNORMAL HIGH (ref 65–99)
POTASSIUM: 3.6 mmol/L (ref 3.5–5.1)
SODIUM: 140 mmol/L (ref 135–145)

## 2015-02-11 LAB — GLUCOSE, CAPILLARY: GLUCOSE-CAPILLARY: 109 mg/dL — AB (ref 65–99)

## 2015-02-11 LAB — TROPONIN I: Troponin I: <0.03 ng/mL (ref <0.031)

## 2015-02-11 MED ORDER — SODIUM CHLORIDE 0.9 % IV BOLUS (SEPSIS)
500.0000 mL | Freq: Once | INTRAVENOUS | Status: AC
  Administered 2015-02-11: 500 mL via INTRAVENOUS

## 2015-02-11 MED ORDER — TRAMADOL HCL 50 MG PO TABS
ORAL_TABLET | ORAL | Status: AC
  Filled 2015-02-11: qty 1

## 2015-02-11 NOTE — ED Provider Notes (Signed)
[facility] Emergency Department Provider Note  ____________________________________________  Time seen: 10:50 AM I have reviewed the triage vital signs and the nursing notes.   HISTORY  Chief Complaint Loss of Consciousness    HPI Peter Weiss is a 74 y.o. male Complains of chronic dizziness this point worse today causing him to fall down. Been eating and drinking normally. States that he is out of his oxycodone for several weeks and wants more oxycodone. He took his neighbors oxycodone last night. Denies any fever chills chest pain shortness of breath numbness tingling weakness headaches or vision changes. As an appointment December 8 with primary care.      Past Medical History  Diagnosis Date  . Arthritis   . Chicken pox   . Heart murmur   . Hypertension   . Hyperlipidemia   . Urinary incontinence   . History of fainting spells of unknown cause   . Diabetes mellitus without complication (HCC)   . Depression   . Aortic stenosis   . Neuromuscular disorder [facility])      Patient Active Problem List   Diagnosis Date Noted  . Multiple falls 12/23/2014  . DDD (degenerative disc disease), lumbar 09/09/2014  . Facet syndrome, lumbar 09/09/2014  . Spinal stenosis, lumbar region, with neurogenic claudication 09/09/2014  . Neuropathy due to secondary diabetes (HCC) 09/09/2014  . Pain in the chest   . Upper GI bleed   . Acute blood loss anemia   . Other chest pain   . Abdominal aortic aneurysm (HCC)   . Essential hypertension   . Adjustment disorder with disturbance of conduct   . Chronic pain syndrome   . Urinary frequency   . Hemorrhagic shock 07/04/2014  . S/P AVR (aortic valve replacement) 07/04/2014  . Chest pain 07/04/2014  . Gastrointestinal hemorrhage 07/04/2014  . Melena   . S/P AVR 04/26/2014  . Acute kidney injury (HCC)   . Aortic stenosis   . Atrial fibrillation, unspecified   . Dental caries   . Pyrexia   . Aortic valve  stenosis, critical 04/20/2014  . Syncope and collapse 04/18/2014  . Dehydration 04/18/2014  . Hypotension 04/18/2014  . Syncope 04/18/2014  . Arterial hypotension 03/25/2014  . Lethargic 03/25/2014  . Adjustment disorder with mixed anxiety and depressed mood 03/25/2014  . Chronic back pain 09/28/2013  . Mild cognitive impairment 09/28/2013  . DM2 (diabetes mellitus, type 2) (HCC) 09/28/2013  . HLD (hyperlipidemia) 09/28/2013  . HTN (hypertension) 09/28/2013  . GERD (gastroesophageal reflux disease) 09/28/2013     Past Surgical History  Procedure Laterality Date  . Goiter  2008  . Multiple extractions with alveoloplasty N/A 04/24/2014    Procedure: Extraction of tooth #'s 2,3,5,12,13,14,15,20 with allveoloplasty and gross debridement of remaining dentition and sectioning of bridge at mesial of abutment tooth #32.;  Surgeon: Charlynne Pander, DDS;  Location: [facility] OR;  Service: Oral Surgery;  Laterality: N/A;  . Aortic valve replacement N/A 04/26/2014    Procedure: AORTIC VALVE REPLACEMENT (AVR);  Surgeon: Loreli Slot, MD;  Location: [facility] OR;  Service: Open Heart Surgery;  Laterality: N/A;  . Maze N/A 04/26/2014    Procedure: MAZE;  Surgeon: Loreli Slot, MD;  Location: [facility] OR;  Service: Open Heart Surgery;  Laterality: N/A;  . Tee without cardioversion N/A 04/26/2014    Procedure: TRANSESOPHAGEAL ECHOCARDIOGRAM (TEE);  Surgeon: Loreli Slot, MD;  Location: [facility] OR;  Service: Open Heart Surgery;  Laterality: N/A;  . Esophagogastroduodenoscopy N/A 07/04/2014  Procedure: ESOPHAGOGASTRODUODENOSCOPY (EGD);  Surgeon: Jeani Hawking, MD;  Location[facility] ENDOSCOPY;  Service: Endoscopy;  Laterality: N/A;  . Cardiac catheterization  03/2014     Current Outpatient Rx  Name  Route  Sig  Dispense  Refill  . donepezil (ARICEPT) 10 MG tablet   Oral   Take 1 tablet (10 mg total) by mouth at bedtime.   90 tablet   0   . furosemide (LASIX) 20 MG tablet   Oral   Take 1 tablet (20 mg  total) by mouth daily.   90 tablet   3   . gabapentin (NEURONTIN) 400 MG capsule   Oral   Take 400 mg by mouth 3 (three) times daily.         . metoprolol tartrate (LOPRESSOR) 25 MG tablet   Oral   Take 12.5 mg by mouth 2 (two) times daily.          Marland Kitchen omeprazole (PRILOSEC) 20 MG capsule   Oral   Take 1 capsule (20 mg total) by mouth daily.   90 capsule   3   . Oxycodone HCl 10 MG TABS   Oral   Take 10 mg by mouth every 6 (six) hours as needed (for pain).         . potassium chloride (K-DUR) 10 MEQ tablet   Oral   Take 10 mEq by mouth daily.         . sertraline (ZOLOFT) 50 MG tablet   Oral   Take 1 tablet (50 mg total) by mouth daily.   90 tablet   0   . simvastatin (ZOCOR) 20 MG tablet   Oral   Take 20 mg by mouth at bedtime.          . tamsulosin (FLOMAX) 0.4 MG CAPS capsule   Oral   Take 1 capsule (0.4 mg total) by mouth daily.   90 capsule   3   . venlafaxine XR (EFFEXOR-XR) 150 MG 24 hr capsule   Oral   Take 150 mg by mouth daily.         Marland Kitchen erythromycin ophthalmic ointment   Right Eye   Place into the right eye 4 (four) times daily. For 7 days Patient not taking: Reported on 02/11/2015   3.5 g   0      Allergies Review of patient's allergies indicates no known allergies.   Family History  Problem Relation Age of Onset  . Alcohol abuse Mother   . Heart disease Mother   . Diabetes Mother   . Heart disease Father   . Diabetes Father   . Cancer Mother   . Cancer Father   . Alcohol abuse Father   . Alcohol abuse Brother     Social History Social History  Substance Use Topics  . Smoking status: Never Smoker   . Smokeless tobacco: Never Used  . Alcohol Use: 0.0 oz/week    0 Standard drinks or equivalent per week    Review of Systems  Constitutional:   No fever or chills. No weight changes Eyes:   No blurry vision or double vision.  ENT:   No sore throat. Cardiovascular:   No chest pain. Respiratory:   No dyspnea or  cough. Gastrointestinal:   Negative for abdominal pain, vomiting and diarrhea.  No BRBPR or melena. Genitourinary:   Negative for dysuria, urinary retention, bloody urine, or difficulty urinating. Musculoskeletal:   Negative for back pain. No joint swelling or pain. Skin:  Negative for rash. Neurological:   Negative for headaches, focal weakness or numbness. Positive dizziness Psychiatric:  No anxiety or depression.   Endocrine:  No hot/cold intolerance, changes in energy, or sleep difficulty.  10-point ROS otherwise negative.  ____________________________________________   PHYSICAL EXAM:  VITAL SIGNS: ED Triage Vitals  Enc Vitals Group     BP 02/11/15 1050 114/68 mmHg     Pulse Rate 02/11/15 1050 77     Resp 02/11/15 1050 18     Temp 02/11/15 1050 98.4 F (36.9 C)     Temp Source 02/11/15 1050 Oral     SpO2 02/11/15 1046 100 %     Weight 02/11/15 1050 250 lb (113.399 kg)     Height 02/11/15 1050 5\' 10"  (1.778 m)     Head Cir --      Peak Flow --      Pain Score 02/11/15 1054 6     Pain Loc --      Pain Edu? --      Excl. in GC? --      Constitutional:   Alert and oriented. Well appearing and in no distress. Eyes:   No scleral icterus. No conjunctival pallor. PERRL. EOMI ENT   Head:   Normocephalic and atraumatic.   Nose:   No congestion/rhinnorhea. No septal hematoma   Mouth/Throat:   MMM, no pharyngeal erythema. No peritonsillar mass. No uvula shift.   Neck:   No stridor. No SubQ emphysema. No meningismus. Hematological/Lymphatic/Immunilogical:   No cervical lymphadenopathy. Cardiovascular:   RRR. Normal and symmetric distal pulses are present in all extremities. No murmurs, rubs, or gallops. Respiratory:   Normal respiratory effort without tachypnea nor retractions. Breath sounds are clear and equal bilaterally. No wheezes/rales/rhonchi. Gastrointestinal:   Soft and nontender. No distention. There is no CVA tenderness.  No rebound, rigidity, or  guarding. Genitourinary:   deferred Musculoskeletal:   Nontender with normal range of motion in all extremities. No joint effusions.  No lower extremity tenderness.  No edema. Neurologic:   Normal speech and language.  CN 2-10 normal. Motor grossly intact. No pronator drift.  Normal gait. No gross focal neurologic deficits are appreciated.  Skin:    Skin is warm, dry and intact. No rash noted.  No petechiae, purpura, or bullae. Psychiatric:   Mood and affect are normal. Speech and behavior are normal. Patient exhibits appropriate insight and judgment.  ____________________________________________    LABS (pertinent positives/negatives) (all labs ordered are listed, but only abnormal results are displayed) Labs Reviewed  BASIC METABOLIC PANEL - Abnormal; Notable for the following:    Glucose, Bld 143 (*)    BUN 21 (*)    Calcium 8.6 (*)    All other components within normal limits  CBC - Abnormal; Notable for the following:    RBC 4.15 (*)    Hemoglobin 11.7 (*)    HCT 36.4 (*)    RDW 15.3 (*)    All other components within normal limits  URINALYSIS COMPLETEWITH MICROSCOPIC ([facility] ONLY) - Abnormal; Notable for the following:    Color, Urine STRAW (*)    APPearance CLEAR (*)    All other components within normal limits  GLUCOSE, CAPILLARY - Abnormal; Notable for the following:    Glucose-Capillary 109 (*)    All other components within normal limits  TROPONIN I  CBG MONITORING, ED   ____________________________________________   EKG  Interpreted by me Sinus rhythm rate of 72, normal axis, normal intervals. A right bundle-branch block.  Normal ST segments and T waves  ____________________________________________    RADIOLOGY  CT head unremarkable  ____________________________________________   PROCEDURES   ____________________________________________   INITIAL IMPRESSION / ASSESSMENT AND PLAN / ED COURSE  Pertinent labs & imaging results that were  available during my care of the patient were reviewed by me and considered in my medical decision making (see chart for details).  Patient presents with dizziness. Workup including EKG CT head labs all unremarkable. Physical exam is nonfocal and reassuring. Low suspicion for ACS PE TAD pneumothorax carditis many status pneumonia sepsis triple a stroke or intracranial hemorrhage. No evidence of sepsis or acute infection. He appears to be at his baseline is may be related to taking oxycodone last night for the first time in a while. He requests more oxycodone which we will defer to his primary care doctor on. Suitable for outpatient follow-up and has an appointment in about one week. I encouraged him to call his doctor to try to move that up.     ____________________________________________   FINAL CLINICAL IMPRESSION(S) / ED DIAGNOSES  Final diagnoses:  Chronic back pain  Dizziness      Sharman Cheek, MD 02/11/15 1443

## 2015-02-11 NOTE — Discharge Instructions (Signed)
Dizziness Dizziness is a common problem. It is a feeling of unsteadiness or light-headedness. You may feel like you are about to faint. Dizziness can lead to injury if you stumble or fall. Anyone can become dizzy, but dizziness is more common in older adults. This condition can be caused by a number of things, including medicines, dehydration, or illness. HOME CARE INSTRUCTIONS Taking these steps may help with your condition: Eating and Drinking  Drink enough fluid to keep your urine clear or pale yellow. This helps to keep you from becoming dehydrated. Try to drink more clear fluids, such as water.  Do not drink alcohol.  Limit your caffeine intake if directed by your health care provider.  Limit your salt intake if directed by your health care provider. Activity  Avoid making quick movements.  Rise slowly from chairs and steady yourself until you feel okay.  In the morning, first sit up on the side of the bed. When you feel okay, stand slowly while you hold onto something until you know that your balance is fine.  Move your legs often if you need to stand in one place for a long time. Tighten and relax your muscles in your legs while you are standing.  Do not drive or operate heavy machinery if you feel dizzy.  Avoid bending down if you feel dizzy. Place items in your home so that they are easy for you to reach without leaning over. Lifestyle  Do not use any tobacco products, including cigarettes, chewing tobacco, or electronic cigarettes. If you need help quitting, ask your health care provider.  Try to reduce your stress level, such as with yoga or meditation. Talk with your health care provider if you need help. General Instructions  Watch your dizziness for any changes.  Take medicines only as directed by your health care provider. Talk with your health care provider if you think that your dizziness is caused by a medicine that you are taking.  Tell a friend or a family  member that you are feeling dizzy. If he or she notices any changes in your behavior, have this person call your health care provider.  Keep all follow-up visits as directed by your health care provider. This is important. SEEK MEDICAL CARE IF:  Your dizziness does not go away.  Your dizziness or light-headedness gets worse.  You feel nauseous.  You have reduced hearing.  You have new symptoms.  You are unsteady on your feet or you feel like the room is spinning. SEEK IMMEDIATE MEDICAL CARE IF:  You vomit or have diarrhea and are unable to eat or drink anything.  You have problems talking, walking, swallowing, or using your arms, hands, or legs.  You feel generally weak.  You are not thinking clearly or you have trouble forming sentences. It may take a friend or family member to notice this.  You have chest pain, abdominal pain, shortness of breath, or sweating.  Your vision changes.  You notice any bleeding.  You have a headache.  You have neck pain or a stiff neck.  You have a fever.   This information is not intended to replace advice given to you by your health care provider. Make sure you discuss any questions you have with your health care provider.   Document Released: 08/25/2000 Document Revised: 07/16/2014 Document Reviewed: 02/25/2014 Elsevier Interactive Patient Education 2016 Elsevier Inc.  Chronic Back Pain  When back pain lasts longer than 3 months, it is called chronic back pain.People  with chronic back pain often go through certain periods that are more intense (flare-ups).  CAUSES Chronic back pain can be caused by wear and tear (degeneration) on different structures in your back. These structures include:  The bones of your spine (vertebrae) and the joints surrounding your spinal cord and nerve roots (facets).  The strong, fibrous tissues that connect your vertebrae (ligaments). Degeneration of these structures may result in pressure on your  nerves. This can lead to constant pain. HOME CARE INSTRUCTIONS  Avoid bending, heavy lifting, prolonged sitting, and activities which make the problem worse.  Take brief periods of rest throughout the day to reduce your pain. Lying down or standing usually is better than sitting while you are resting.  Take over-the-counter or prescription medicines only as directed by your caregiver. SEEK IMMEDIATE MEDICAL CARE IF:   You have weakness or numbness in one of your legs or feet.  You have trouble controlling your bladder or bowels.  You have nausea, vomiting, abdominal pain, shortness of breath, or fainting.   This information is not intended to replace advice given to you by your health care provider. Make sure you discuss any questions you have with your health care provider.   Document Released: 04/08/2004 Document Revised: 05/24/2011 Document Reviewed: 08/19/2014 Elsevier Interactive Patient Education [employer]2016 Elsevier Inc.

## 2015-02-11 NOTE — ED Notes (Signed)
Patient transported to CT 

## 2015-02-11 NOTE — ED Notes (Signed)
Pt to ED via EMS transport, pt states he has been having some intermittent dizziness for "a while", states he started having dizziness this am and had syncopal episode, pt able to call EMS, EMS advised upon there arrival pt was found under coffee table, pt states he had a heart valve placement in March, states he has been out of his oxycodone for a couple of weeks and he took one of his neighbors last night, pt denies any pain at present, does c/o dizziness

## 2015-03-05 ENCOUNTER — Other Ambulatory Visit: Payer: Self-pay | Admitting: Cardiovascular Disease

## 2015-03-14 ENCOUNTER — Telehealth: Payer: Self-pay | Admitting: *Deleted

## 2015-03-14 NOTE — Telephone Encounter (Signed)
Pt is calling stating he needs a new order for home health.  [facility], is having issues with [facility] and can't be with them anymore.   Please advise

## 2015-03-14 NOTE — Telephone Encounter (Signed)
Pt will need to contact PCP for this referral. He was advised of this at last ov.

## 2015-10-20 ENCOUNTER — Telehealth: Payer: Self-pay | Admitting: Cardiovascular Disease

## 2015-10-20 NOTE — Telephone Encounter (Signed)
Patient says he sees someone else every month and does not need to fu with [facility]. Deleting recall.

## 2016-01-27 ENCOUNTER — Encounter (HOSPITAL_COMMUNITY): Payer: Self-pay | Admitting: *Deleted

## 2016-01-27 ENCOUNTER — Emergency Department (HOSPITAL_COMMUNITY): Payer: Medicare Other

## 2016-01-27 ENCOUNTER — Emergency Department (HOSPITAL_COMMUNITY)
Admission: EM | Admit: 2016-01-27 | Discharge: 2016-01-27 | Disposition: A | Discharge status: Home / Self Care | Payer: Medicare Other | Attending: Emergency Medicine | Admitting: Emergency Medicine

## 2016-01-27 DIAGNOSIS — J40 Bronchitis, not specified as acute or chronic: Secondary | ICD-10-CM | POA: Diagnosis not present

## 2016-01-27 DIAGNOSIS — R0602 Shortness of breath: Secondary | ICD-10-CM

## 2016-01-27 DIAGNOSIS — E119 Type 2 diabetes mellitus without complications: Secondary | ICD-10-CM | POA: Diagnosis not present

## 2016-01-27 DIAGNOSIS — I509 Heart failure, unspecified: Secondary | ICD-10-CM | POA: Diagnosis not present

## 2016-01-27 HISTORY — DX: Heart failure, unspecified: I50.9

## 2016-01-27 LAB — CBC WITH DIFFERENTIAL/PLATELET
Basophils Absolute: 0 10*3/uL (ref 0.0–0.1)
Basophils Relative: 0 %
EOS PCT: 1 %
Eosinophils Absolute: 0 10*3/uL (ref 0.0–0.7)
HEMATOCRIT: 40.5 % (ref 39.0–52.0)
Hemoglobin: 13.2 g/dL (ref 13.0–17.0)
LYMPHS ABS: 0.6 10*3/uL — AB (ref 0.7–4.0)
LYMPHS PCT: 9 %
MCH: 29.6 pg (ref 26.0–34.0)
MCHC: 32.6 g/dL (ref 30.0–36.0)
MCV: 90.8 fL (ref 78.0–100.0)
MONO ABS: 0.7 10*3/uL (ref 0.1–1.0)
MONOS PCT: 11 %
Neutro Abs: 5.2 10*3/uL (ref 1.7–7.7)
Neutrophils Relative %: 79 %
PLATELETS: 171 10*3/uL (ref 150–400)
RBC: 4.46 MIL/uL (ref 4.22–5.81)
RDW: 14.2 % (ref 11.5–15.5)
WBC: 6.6 10*3/uL (ref 4.0–10.5)

## 2016-01-27 LAB — BASIC METABOLIC PANEL
Anion gap: 9 (ref 5–15)
BUN: 12 mg/dL (ref 6–20)
CALCIUM: 9.3 mg/dL (ref 8.9–10.3)
CO2: 27 mmol/L (ref 22–32)
Chloride: 102 mmol/L (ref 101–111)
Creatinine, Ser: 0.98 mg/dL (ref 0.61–1.24)
GFR calc Af Amer: >60 mL/min (ref >60)
GLUCOSE: 177 mg/dL — AB (ref 65–99)
Potassium: 3.8 mmol/L (ref 3.5–5.1)
Sodium: 138 mmol/L (ref 135–145)

## 2016-01-27 LAB — I-STAT TROPONIN, ED: Troponin i, poc: 0 ng/mL (ref 0.00–0.08)

## 2016-01-27 LAB — BRAIN NATRIURETIC PEPTIDE: B Natriuretic Peptide: 163.7 pg/mL — ABNORMAL HIGH (ref 0.0–100.0)

## 2016-01-27 MED ORDER — ALBUTEROL SULFATE HFA 108 (90 BASE) MCG/ACT IN AERS
2.0000 | INHALATION_SPRAY | Freq: Once | RESPIRATORY_TRACT | Status: AC
  Administered 2016-01-27: 2 via RESPIRATORY_TRACT
  Filled 2016-01-27: qty 6.7

## 2016-01-27 MED ORDER — IPRATROPIUM-ALBUTEROL 0.5-2.5 (3) MG/3ML IN SOLN
3.0000 mL | Freq: Once | RESPIRATORY_TRACT | Status: AC
  Administered 2016-01-27: 3 mL via RESPIRATORY_TRACT
  Filled 2016-01-27: qty 3

## 2016-01-27 NOTE — ED Notes (Signed)
Patient physically moved from hallway to room A5; patient placed on monitor, continuous pulse oximetry and blood pressure cuff

## 2016-01-27 NOTE — ED Provider Notes (Signed)
[facility]-EMERGENCY DEPT Provider Note   CSN: [id number] Arrival date & time: 01/27/16  1230     History   Chief Complaint Chief Complaint  Patient presents with  . Shortness of Breath    HPI Peter Weiss is a 75 y.o. male.  The history is provided by the patient.  Shortness of Breath  This is a new problem. The average episode lasts 2 days. The problem occurs continuously.Episode onset: gradual onset. The problem has been gradually worsening. Associated symptoms include rhinorrhea, cough and sputum production. Pertinent negatives include no fever, no hemoptysis, no chest pain, no vomiting, no abdominal pain and no leg swelling. Associated medical issues include heart failure. Associated medical issues do not include COPD or CAD.   Reports being around sick contacts recently.   Past Medical History:  Diagnosis Date  . CHF (congestive heart failure) (HCC)   . Diabetes mellitus without complication (HCC)     There are no active problems to display for this patient.   History reviewed. No pertinent surgical history.     Home Medications    Prior to Admission medications   Not on File    Family History History reviewed. No pertinent family history.  Social History Social History  Substance Use Topics  . Smoking status: Never Smoker  . Smokeless tobacco: Never Used  . Alcohol use Not on file     Allergies   Patient has no allergy information on record.   Review of Systems Review of Systems  Constitutional: Negative for fever.  HENT: Positive for rhinorrhea.   Respiratory: Positive for cough, sputum production and shortness of breath. Negative for hemoptysis.   Cardiovascular: Negative for chest pain and leg swelling.  Gastrointestinal: Negative for abdominal pain and vomiting.  Ten systems are reviewed and are negative for acute change except as noted in the HPI    Physical Exam Updated Vital Signs BP 168/79 (BP Location: Right Arm)   Pulse 98   Temp  98.9 F (37.2 C) (Oral)   Resp 20   Ht 5\' 11"  (1.803 m)   Wt 250 lb (113.4 kg)   SpO2 97%   BMI 34.87 kg/m   Physical Exam  Constitutional: He is oriented to person, place, and time. He appears well-developed and well-nourished. No distress.  HENT:  Head: Normocephalic and atraumatic.  Nose: Rhinorrhea present.  Nasal congestion  Eyes: Conjunctivae and EOM are normal. Pupils are equal, round, and reactive to light. Right eye exhibits no discharge. Left eye exhibits no discharge. No scleral icterus.  Neck: Normal range of motion. Neck supple.  Cardiovascular: Normal rate and regular rhythm.  Exam reveals no gallop and no friction rub.   No murmur heard. Pulmonary/Chest: Effort normal and breath sounds normal. No stridor. No respiratory distress. He has no rales.  Abdominal: Soft. He exhibits no distension. There is no tenderness.  Musculoskeletal: He exhibits no edema or tenderness.  Neurological: He is alert and oriented to person, place, and time.  Skin: Skin is warm and dry. No rash noted. He is not diaphoretic. No erythema.  Psychiatric: He has a normal mood and affect.  Vitals reviewed.    ED Treatments / Results  Labs (all labs ordered are listed, but only abnormal results are displayed) Labs Reviewed  CBC WITH DIFFERENTIAL/PLATELET - Abnormal; Notable for the following:       Result Value   Lymphs Abs 0.6 (*)    All other components within normal limits  BASIC METABOLIC PANEL - Abnormal; Notable  for the following:    Glucose, Bld 177 (*)    All other components within normal limits  BRAIN NATRIURETIC PEPTIDE - Abnormal; Notable for the following:    B Natriuretic Peptide 163.7 (*)    All other components within normal limits  I-STAT TROPOININ, ED    EKG  EKG Interpretation  Date/Time:  Tuesday January 27 2016 12:44:24 EST Ventricular Rate:  99 PR Interval:    QRS Duration: 148 QT Interval:  408 QTC Calculation: 523 R Axis:   -29 Text Interpretation:   Undetermined rhythm Indeterminate axis Right bundle branch block Inferior infarct , age undetermined Abnormal ECG No old tracing to compare Confirmed by [facility] MD, Sigismund Cross [phone]) on 01/27/2016 12:54:08 PM       Radiology Dg Chest 2 View  Result Date: 01/27/2016 CLINICAL DATA:  Syncope last night.  Cough for 3 days. EXAM: CHEST  2 VIEW COMPARISON:  None. FINDINGS: The patient is status post median sternotomy, aortic valve replacement and clipping of the atrial appendage. There is cardiomegaly without edema. Lungs are clear. No pneumothorax or pleural effusion. Aortic atherosclerosis noted. No bony abnormality. IMPRESSION: Cardiomegaly without acute disease. Electronically Signed   By: Drusilla Kannerhomas  Dalessio M.D.   On: 01/27/2016 16:09    Procedures Procedures (including critical care time)  Medications Ordered in ED Medications  albuterol (PROVENTIL HFA;VENTOLIN HFA) 108 (90 Base) MCG/ACT inhaler 2 puff (not administered)  ipratropium-albuterol (DUONEB) 0.5-2.5 (3) MG/3ML nebulizer solution 3 mL (3 mLs Nebulization Given 01/27/16 1405)     Initial Impression / Assessment and Plan / ED Course  I have reviewed the triage vital signs and the nursing notes.  Pertinent labs & imaging results that were available during my care of the patient were reviewed by me and considered in my medical decision making (see chart for details).  Clinical Course     No evidence of volume overload on exam. Confirmed by BNP. Chest x-ray without evidence of pulmonary edema, pneumonia. Labs grossly reassuring. Troponin negative. EKG without acute ischemia. Presentation is highly consistent with ACS. Low suspicion for pulmonary embolism. Most consistent with bronchitis.  Provided with albuterol for symptomatic treatment and improved sputum clearance.  The patient is safe for discharge with strict return precautions.   Final Clinical Impressions(s) / ED Diagnoses   Final diagnoses:  SOB (shortness of breath)    Bronchitis   Disposition: Discharge  Condition: Good  I have discussed the results, Dx and Tx plan with the patient who expressed understanding and agree(s) with the plan. Discharge instructions discussed at great length. The patient was given strict return precautions who verbalized understanding of the instructions. No further questions at time of discharge.    New Prescriptions   No medications on file    [facility] [address] [place] [id number] [phone] Call  For help establishing care with a care provider      Nira ConnPedro Eduardo Jaydrian Corpening, MD 01/27/16 [phone]

## 2016-01-27 NOTE — ED Triage Notes (Signed)
Patient comes in per [facility] with c/o SOB, dizziness, generalized weakness over last 3 days. Hx CHF, valve replacement surgeries. Possible non compliance with meds. 100.5 temporal temp. HR 98, BP 160/98, 97% on 2L. fsbs 255. IV 18 in LAC.

## 2016-01-28 ENCOUNTER — Encounter: Payer: Self-pay | Admitting: Emergency Medicine

## 2016-08-18 ENCOUNTER — Ambulatory Visit (INDEPENDENT_AMBULATORY_CARE_PROVIDER_SITE_OTHER): Payer: Medicare Other | Admitting: Emergency Medicine

## 2016-08-18 VITALS — BP 127/74 | HR 57 | Temp 98.3°F | Resp 16 | Ht 71.0 in | Wt 261.4 lb

## 2016-08-18 DIAGNOSIS — G8929 Other chronic pain: Secondary | ICD-10-CM

## 2016-08-18 DIAGNOSIS — M545 Low back pain: Secondary | ICD-10-CM

## 2016-08-18 DIAGNOSIS — G894 Chronic pain syndrome: Secondary | ICD-10-CM

## 2016-08-18 NOTE — Patient Instructions (Signed)
     IF you received an x-ray today, you will receive an invoice from [facility]. Please contact [facility] at [phone] with questions or concerns regarding your invoice.   IF you received labwork today, you will receive an invoice from [facility]. Please contact [facility] at [phone] with questions or concerns regarding your invoice.   Our billing staff will not be able to assist you with questions regarding bills from these companies.  You will be contacted with the lab results as soon as they are available. The fastest way to get your results is to activate your My Chart account. Instructions are located on the last page of this paperwork. If you have not heard from us regarding the results in 2 weeks, please contact this office.     

## 2016-08-18 NOTE — Progress Notes (Addendum)
Peter Weiss 76 y.o.   Chief Complaint  Patient presents with  . established care    medication refill- ALL    HISTORY OF PRESENT ILLNESS: This is a 76 y.o. male here to establish care. Multiple medical problems including chronic back pain and chronic pain syndrome. Here with niece. On Oxycodone. Became very upset and verbally abusive when I started advising him about chronic narcotic use and need for pain management referral. Started using profanities and walked out of the room.  HPI   Prior to Admission medications   Medication Sig Start Date End Date Taking? Authorizing Provider  donepezil (ARICEPT) 10 MG tablet Take 1 tablet (10 mg total) by mouth at bedtime. 07/31/14  Yes Antonieta Iba, MD  erythromycin ophthalmic ointment Place into the right eye 4 (four) times daily. For 7 days 12/24/14  Yes Shaune Pollack, MD  furosemide (LASIX) 20 MG tablet Take 1 tablet (20 mg total) by mouth daily. 07/31/14  Yes Gollan, Tollie Pizza, MD  gabapentin (NEURONTIN) 400 MG capsule Take 400 mg by mouth 3 (three) times daily.   Yes [provider]  metoprolol tartrate (LOPRESSOR) 25 MG tablet Take 12.5 mg by mouth 2 (two) times daily.    Yes [provider]  omeprazole (PRILOSEC) 20 MG capsule Take 1 capsule (20 mg total) by mouth daily. 07/31/14  Yes Gollan, Tollie Pizza, MD  Oxycodone HCl 10 MG TABS Take 10 mg by mouth every 6 (six) hours as needed (for pain).   Yes [provider]  potassium chloride (K-DUR) 10 MEQ tablet Take 10 mEq by mouth daily.   Yes [provider]  simvastatin (ZOCOR) 20 MG tablet Take 20 mg by mouth at bedtime.    Yes [provider]  tamsulosin (FLOMAX) 0.4 MG CAPS capsule Take 1 capsule (0.4 mg total) by mouth daily. 07/31/14  Yes Antonieta Iba, MD  venlafaxine XR (EFFEXOR-XR) 150 MG 24 hr capsule Take 150 mg by mouth daily.   Yes [provider]  sertraline (ZOLOFT) 50 MG tablet Take 1 tablet (50 mg total) by mouth  daily. Patient not taking: Reported on 08/18/2016 07/31/14   Antonieta Iba, MD    No Known Allergies  Patient Active Problem List   Diagnosis Date Noted  . Multiple falls 12/23/2014  . DDD (degenerative disc disease), lumbar 09/09/2014  . Facet syndrome, lumbar (HCC) 09/09/2014  . Spinal stenosis, lumbar region, with neurogenic claudication 09/09/2014  . Neuropathy due to secondary diabetes (HCC) 09/09/2014  . Pain in the chest   . Upper GI bleed   . Acute blood loss anemia   . Other chest pain   . Abdominal aortic aneurysm (HCC)   . Essential hypertension   . Adjustment disorder with disturbance of conduct   . Chronic pain syndrome   . Urinary frequency   . Hemorrhagic shock (HCC) 07/04/2014  . S/P AVR (aortic valve replacement) 07/04/2014  . Chest pain 07/04/2014  . Gastrointestinal hemorrhage 07/04/2014  . Melena   . S/P AVR 04/26/2014  . Acute kidney injury (HCC)   . Aortic stenosis   . Atrial fibrillation, unspecified   . Dental caries   . Pyrexia   . Aortic valve stenosis, critical 04/20/2014  . Syncope and collapse 04/18/2014  . Dehydration 04/18/2014  . Hypotension 04/18/2014  . Syncope 04/18/2014  . Arterial hypotension 03/25/2014  . Lethargic 03/25/2014  . Adjustment disorder with mixed anxiety and depressed mood 03/25/2014  . Chronic back pain 09/28/2013  .  Mild cognitive impairment 09/28/2013  . DM2 (diabetes mellitus, type 2) (HCC) 09/28/2013  . HLD (hyperlipidemia) 09/28/2013  . HTN (hypertension) 09/28/2013  . GERD (gastroesophageal reflux disease) 09/28/2013    Past Medical History:  Diagnosis Date  . Aortic stenosis   . Arthritis   . CHF (congestive heart failure) (HCC)   . Chicken pox   . Depression   . Diabetes mellitus without complication (HCC)   . Heart murmur   . History of fainting spells of unknown cause   . Hyperlipidemia   . Hypertension   . Neuromuscular disorder (HCC)   . Urinary incontinence     Past Surgical History:   Procedure Laterality Date  . AORTIC VALVE REPLACEMENT N/A 04/26/2014   Procedure: AORTIC VALVE REPLACEMENT (AVR);  Surgeon: Loreli SlotSteven C Hendrickson, MD;  [facility];  Service: Open Heart Surgery;  Laterality: N/A;  . CARDIAC CATHETERIZATION  03/2014  . ESOPHAGOGASTRODUODENOSCOPY N/A 07/04/2014   Procedure: ESOPHAGOGASTRODUODENOSCOPY (EGD);  Surgeon: Jeani HawkingPatrick Hung, MD;  [facility];  Service: Endoscopy;  Laterality: N/A;  . goiter  2008  . MAZE N/A 04/26/2014   Procedure: MAZE;  Surgeon: Loreli SlotSteven C Hendrickson, MD;  [facility];  Service: Open Heart Surgery;  Laterality: N/A;  . MULTIPLE EXTRACTIONS WITH ALVEOLOPLASTY N/A 04/24/2014   Procedure: Extraction of tooth #'s 2,3,5,12,13,14,15,20 with allveoloplasty and gross debridement of remaining dentition and sectioning of bridge at mesial of abutment tooth #32.;  Surgeon: Charlynne Panderonald F Kulinski, DDS;  [facility];  Service: Oral Surgery;  Laterality: N/A;  . TEE WITHOUT CARDIOVERSION N/A 04/26/2014   Procedure: TRANSESOPHAGEAL ECHOCARDIOGRAM (TEE);  Surgeon: Loreli SlotSteven C Hendrickson, MD;  [facility];  Service: Open Heart Surgery;  Laterality: N/A;    Social History   Social History  . Marital status: Divorced    Spouse name: N/A  . Number of children: N/A  . Years of education: N/A   Occupational History  . Not on file.   Social History Main Topics  . Smoking status: Never Smoker  . Smokeless tobacco: Never Used  . Alcohol use 0.0 oz/week  . Drug use: Yes    Types: Marijuana     Comment: once a month  . Sexual activity: Not Currently   Other Topics Concern  . Not on file   Social History Narrative   ** Merged History Encounter **       ** Merged History Encounter **        Family History  Problem Relation Age of Onset  . Alcohol abuse Mother   . Heart disease Mother   . Diabetes Mother   . Heart disease Father   . Diabetes Father   . Cancer Mother   . Cancer Father   . Alcohol abuse Father   . Alcohol  abuse Brother      Review of Systems  Unable to perform ROS: Other     Physical Exam  Constitutional: He is oriented to person, place, and time. He appears well-developed.  Neurological: He is alert and oriented to person, place, and time.  Vitals reviewed.  Not performed.  ASSESSMENT & PLAN:  Maisie Fushomas was seen today for established care.  Diagnoses and all orders for this visit:  Chronic pain syndrome  Chronic low back pain, unspecified back pain laterality, with sciatica presence unspecified  Patient walked out.   Edwina BarthMiguel Adham Johnson, MD [facility]

## 2016-08-31 ENCOUNTER — Emergency Department (HOSPITAL_COMMUNITY): Payer: Medicare Other

## 2016-08-31 ENCOUNTER — Encounter (HOSPITAL_COMMUNITY): Payer: Self-pay | Admitting: Emergency Medicine

## 2016-08-31 ENCOUNTER — Emergency Department (HOSPITAL_COMMUNITY)
Admission: EM | Admit: 2016-08-31 | Discharge: 2016-08-31 | Disposition: A | Discharge status: Home / Self Care | Payer: Medicare Other | Attending: Emergency Medicine | Admitting: Emergency Medicine

## 2016-08-31 DIAGNOSIS — I509 Heart failure, unspecified: Secondary | ICD-10-CM | POA: Diagnosis not present

## 2016-08-31 DIAGNOSIS — R0789 Other chest pain: Secondary | ICD-10-CM

## 2016-08-31 DIAGNOSIS — I110 Hypertensive heart disease with heart failure: Secondary | ICD-10-CM | POA: Diagnosis not present

## 2016-08-31 DIAGNOSIS — E119 Type 2 diabetes mellitus without complications: Secondary | ICD-10-CM | POA: Diagnosis not present

## 2016-08-31 DIAGNOSIS — E785 Hyperlipidemia, unspecified: Secondary | ICD-10-CM | POA: Diagnosis not present

## 2016-08-31 DIAGNOSIS — Z79899 Other long term (current) drug therapy: Secondary | ICD-10-CM | POA: Diagnosis not present

## 2016-08-31 DIAGNOSIS — R079 Chest pain, unspecified: Secondary | ICD-10-CM | POA: Diagnosis present

## 2016-08-31 LAB — BASIC METABOLIC PANEL
Anion gap: 11 (ref 5–15)
BUN: 18 mg/dL (ref 6–20)
CALCIUM: 9.1 mg/dL (ref 8.9–10.3)
CO2: 22 mmol/L (ref 22–32)
Chloride: 104 mmol/L (ref 101–111)
Creatinine, Ser: 0.95 mg/dL (ref 0.61–1.24)
GLUCOSE: 179 mg/dL — AB (ref 65–99)
Potassium: 4.1 mmol/L (ref 3.5–5.1)
SODIUM: 137 mmol/L (ref 135–145)

## 2016-08-31 LAB — CBC
HCT: 41.4 % (ref 39.0–52.0)
Hemoglobin: 13.4 g/dL (ref 13.0–17.0)
MCH: 30 pg (ref 26.0–34.0)
MCHC: 32.4 g/dL (ref 30.0–36.0)
MCV: 92.8 fL (ref 78.0–100.0)
PLATELETS: 166 10*3/uL (ref 150–400)
RBC: 4.46 MIL/uL (ref 4.22–5.81)
RDW: 14.2 % (ref 11.5–15.5)
WBC: 7.3 10*3/uL (ref 4.0–10.5)

## 2016-08-31 LAB — I-STAT TROPONIN, ED: TROPONIN I, POC: 0 ng/mL (ref 0.00–0.08)

## 2016-08-31 NOTE — Discharge Instructions (Signed)
[facility] or the primary doctor of your choice

## 2016-08-31 NOTE — ED Notes (Signed)
Pt waiting to find ride home, given cab voucher brought to waiting room from hallway

## 2016-08-31 NOTE — ED Notes (Addendum)
Pt received NTG x 1 sl and ASA 324mg  po enroute,.  Pt to ed via [facility] with c/o chest pain -- relieved with 1 ntg. Pt requesting to have toenails cut-- explained that we do not cut toenails.

## 2016-08-31 NOTE — ED Provider Notes (Signed)
[facility] Provider Note   CSN: [id number] Arrival date & time: 08/31/16  1030     History   Chief Complaint Chief Complaint  Patient presents with  . Chest Pain    HPI Peter Weiss is a 76 y.o. male who presents with chest pain. PMH significant for CHF EF 65-70% in 2016, severe AS s/p AVR, A.fib, hx of GIB, Type 2 DM, HTN, HLD, chronic pain. He states that he had an acute onset of chest pain at about 9 AM this morning. It is over the left side of his chest and nonradiating. Feels sharp. He reports that he was just sitting when it started. He received 1 nitroglycerin and aspirin. Currently states it is feeling better. He denies having episodes of chest pain like this before. He does not currently have a PCP and has been out of most of his medicines. He has not seen a cardiologist in years. He reports [place] lives in a "[place]" and is unable to drive to get to his appointments. No fever or chills, leg swelling, shortness of breath, cough, wheezing, abdominal pain, nausea or vomiting, syncope. Cath in Jan 2016 by Dr Mariah Milling at [facility] showed minimal disease.  HPI  Past Medical History:  Diagnosis Date  . Aortic stenosis   . Arthritis   . CHF (congestive heart failure) (HCC)   . Chicken pox   . Depression   . Diabetes mellitus without complication (HCC)   . Heart murmur   . History of fainting spells of unknown cause   . Hyperlipidemia   . Hypertension   . Neuromuscular disorder (HCC)   . Urinary incontinence     Patient Active Problem List   Diagnosis Date Noted  . Multiple falls 12/23/2014  . DDD (degenerative disc disease), lumbar 09/09/2014  . Facet syndrome, lumbar (HCC) 09/09/2014  . Spinal stenosis, lumbar region, with neurogenic claudication 09/09/2014  . Neuropathy due to secondary diabetes (HCC) 09/09/2014  . Upper GI bleed   . Acute blood loss anemia   . Abdominal aortic aneurysm (HCC)   . Essential hypertension   . Adjustment disorder with  disturbance of conduct   . Chronic pain syndrome   . Urinary frequency   . Hemorrhagic shock (HCC) 07/04/2014  . S/P AVR (aortic valve replacement) 07/04/2014  . Chest pain 07/04/2014  . Gastrointestinal hemorrhage 07/04/2014  . Melena   . Acute kidney injury (HCC)   . Aortic stenosis   . Atrial fibrillation, unspecified   . Dental caries   . Pyrexia   . Aortic valve stenosis, critical 04/20/2014  . Syncope and collapse 04/18/2014  . Dehydration 04/18/2014  . Hypotension 04/18/2014  . Lethargic 03/25/2014  . Adjustment disorder with mixed anxiety and depressed mood 03/25/2014  . Chronic back pain 09/28/2013  . Mild cognitive impairment 09/28/2013  . DM2 (diabetes mellitus, type 2) (HCC) 09/28/2013  . HLD (hyperlipidemia) 09/28/2013  . GERD (gastroesophageal reflux disease) 09/28/2013    Past Surgical History:  Procedure Laterality Date  . AORTIC VALVE REPLACEMENT N/A 04/26/2014   Procedure: AORTIC VALVE REPLACEMENT (AVR);  Surgeon: Loreli Slot, MD;  Location[facility];  Service: Open Heart Surgery;  Laterality: N/A;  . CARDIAC CATHETERIZATION  03/2014  . ESOPHAGOGASTRODUODENOSCOPY N/A 07/04/2014   Procedure: ESOPHAGOGASTRODUODENOSCOPY (EGD);  Surgeon: Jeani Hawking, MD;  [facility] END[facility]COPY;  Service: Endoscopy;  Laterality: N/A;  . goiter  2008  . MAZE N/A 04/26/2014   Procedure: MAZE;  Surgeon: Loreli Slot, MD;  Location:  [facility] OR;  Service: Open Heart Surgery;  Laterality: N/A;  . MULTIPLE EXTRACTIONS WITH ALVEOLOPLASTY N/A 04/24/2014   Procedure: Extraction of tooth #'s 2,3,5,12,13,14,15,20 with allveoloplasty and gross debridement of remaining dentition and sectioning of bridge at mesial of abutment tooth #32.;  Surgeon: Charlynne Panderonald F Kulinski, DDS;  [facility];  Service: Oral Surgery;  Laterality: N/A;  . TEE WITHOUT CARDIOVERSION N/A 04/26/2014   Procedure: TRANSESOPHAGEAL ECHOCARDIOGRAM (TEE);  Surgeon: Loreli SlotSteven C Hendrickson, MD;  [facility];  Service: Open  Heart Surgery;  Laterality: N/A;       Home Medications    Prior to Admission medications   Medication Sig Start Date End Date Taking? Authorizing Provider  donepezil (ARICEPT) 10 MG tablet Take 1 tablet (10 mg total) by mouth at bedtime. 07/31/14   Antonieta IbaGollan, Timothy J, MD  erythromycin ophthalmic ointment Place into the right eye 4 (four) times daily. For 7 days 12/24/14   Shaune Pollackhen, Qing, MD  furosemide (LASIX) 20 MG tablet Take 1 tablet (20 mg total) by mouth daily. 07/31/14   Antonieta IbaGollan, Timothy J, MD  gabapentin (NEURONTIN) 400 MG capsule Take 400 mg by mouth 3 (three) times daily.    [provider]  metoprolol tartrate (LOPRESSOR) 25 MG tablet Take 12.5 mg by mouth 2 (two) times daily.     [provider]  omeprazole (PRILOSEC) 20 MG capsule Take 1 capsule (20 mg total) by mouth daily. 07/31/14   Antonieta IbaGollan, Timothy J, MD  Oxycodone HCl 10 MG TABS Take 10 mg by mouth every 6 (six) hours as needed (for pain).    [provider]  potassium chloride (K-DUR) 10 MEQ tablet Take 10 mEq by mouth daily.    [provider]  sertraline (ZOLOFT) 50 MG tablet Take 1 tablet (50 mg total) by mouth daily. Patient not taking: Reported on 08/18/2016 07/31/14   Antonieta IbaGollan, Timothy J, MD  simvastatin (ZOCOR) 20 MG tablet Take 20 mg by mouth at bedtime.     [provider]  tamsulosin (FLOMAX) 0.4 MG CAPS capsule Take 1 capsule (0.4 mg total) by mouth daily. 07/31/14   Antonieta IbaGollan, Timothy J, MD  venlafaxine XR (EFFEXOR-XR) 150 MG 24 hr capsule Take 150 mg by mouth daily.    [provider]    Family History Family History  Problem Relation Age of Onset  . Alcohol abuse Mother   . Heart disease Mother   . Diabetes Mother   . Heart disease Father   . Diabetes Father   . Cancer Mother   . Cancer Father   . Alcohol abuse Father   . Alcohol abuse Brother     Social History Social History  Substance Use Topics  . Smoking status: Never Smoker  . Smokeless tobacco: Never  Used  . Alcohol use 0.0 oz/week     Allergies   Patient has no known allergies.   Review of Systems Review of Systems  Constitutional: Negative for chills and fever.  Respiratory: Negative for cough and shortness of breath.   Cardiovascular: Positive for chest pain. Negative for leg swelling.  Gastrointestinal: Negative for abdominal pain and nausea.  Neurological: Negative for syncope.  All other systems reviewed and are negative.    Physical Exam Updated Vital Signs BP (!) 172/74 (BP Location: Right Arm)   Pulse 80   Temp 98.1 F (36.7 C) (Oral)   Resp 13   Ht 5\' 11"  (1.803 m)   Wt 118.4 kg (261 lb)   SpO2 99%  BMI 36.40 kg/m   Physical Exam  Constitutional: He is oriented to person, place, and time. He appears well-developed and well-nourished. No distress.  Obese, cooperative  HENT:  Head: Normocephalic and atraumatic.  Eyes: Conjunctivae are normal. Pupils are equal, round, and reactive to light. Right eye exhibits no discharge. Left eye exhibits no discharge. No scleral icterus.  Neck: Normal range of motion.  Cardiovascular: Normal rate and regular rhythm.  Exam reveals no gallop and no friction rub.   No murmur heard. Pulmonary/Chest: Effort normal and breath sounds normal. No respiratory distress. He has no wheezes. He has no rales. He exhibits no tenderness.  Abdominal: Soft. Bowel sounds are normal. He exhibits no distension. There is no tenderness.  Musculoskeletal:  No leg edema  Neurological: He is alert and oriented to person, place, and time.  Skin: Skin is warm and dry.  Psychiatric: He has a normal mood and affect. His behavior is normal.  Nursing note and vitals reviewed.    ED Treatments / Results  Labs (all labs ordered are listed, but only abnormal results are displayed) Labs Reviewed  BASIC METABOLIC PANEL - Abnormal; Notable for the following:       Result Value   Glucose, Bld 179 (*)    All other components within normal limits    CBC  I-STAT TROPOININ, ED    EKG  EKG Interpretation  Date/Time:  Tuesday August 31 2016 10:37:49 EDT Ventricular Rate:  79 PR Interval:    QRS Duration: 157 QT Interval:  454 QTC Calculation: 521 R Axis:   -19 Text Interpretation:  Sinus rhythm Atrial premature complex Right bundle branch block Inferior infarct, old No significant change was found Confirmed by Azalia Bilis ([id number]) on 08/31/2016 1:37:39 PM       Radiology Dg Chest 2 View  Result Date: 08/31/2016 CLINICAL DATA:  Onset of left-sided chest pain this morning. History of CHF, aortic stenosis, diabetes, and hypertension. EXAM: CHEST  2 VIEW COMPARISON:  Chest x-ray of December 23, 2014. FINDINGS: The lungs are mildly hypoinflated. There is no focal infiltrate. There is no pleural effusion or pneumothorax. The pulmonary vascularity is not engorged. The cardiac silhouette is mildly enlarged. A left atrial appendage clip and prosthetic aortic valve ring are visible. The sternal wires are intact. There is calcification in the wall of the aortic arch. The bony thorax exhibits no acute abnormality. IMPRESSION: Mild hypoinflation.  Cardiomegaly without pulmonary edema. Thoracic aortic atherosclerosis. Electronically Signed   By: David  [place] M.D.   On: 08/31/2016 12:50    Procedures Procedures (including critical care time)  Medications Ordered in ED Medications - No data to display   Initial Impression / Assessment and Plan / ED Course  I have reviewed the triage vital signs and the nursing notes.  Pertinent labs & imaging results that were available during my care of the patient were reviewed by me and considered in my medical decision making (see chart for details).  76 year old male with chest pain. Seems MSK in nature and is reproducible. Chest pain work up is reassuring. Doubt ACS, PE, pericarditis, esophageal rupture, tension pneumothorax, aortic dissection, cardiac tamponade. EKG is NSR and PACs and RBBB with no  significant change since last. CXR is negative. Troponin is 0. Labs are remarkable for hyperglycemia. He had a cath which showed minimal disease in 2016. His main issue is that he has poor follow up. I gave a referral to [facility] and urged him to f/u. He said  he would try. Return precautions given.  Final Clinical Impressions(s) / ED Diagnoses   Final diagnoses:  Atypical chest pain    New Prescriptions New Prescriptions   No medications on file     Beryle Quant 08/31/16 1557    Azalia Bilis, MD 08/31/16 [phone]

## 2016-10-20 DIAGNOSIS — E119 Type 2 diabetes mellitus without complications: Secondary | ICD-10-CM | POA: Diagnosis not present

## 2016-10-20 DIAGNOSIS — N401 Enlarged prostate with lower urinary tract symptoms: Secondary | ICD-10-CM | POA: Diagnosis not present

## 2016-10-20 DIAGNOSIS — D539 Nutritional anemia, unspecified: Secondary | ICD-10-CM | POA: Diagnosis not present

## 2016-10-20 DIAGNOSIS — M545 Low back pain: Secondary | ICD-10-CM | POA: Diagnosis not present

## 2016-10-20 DIAGNOSIS — R5383 Other fatigue: Secondary | ICD-10-CM | POA: Diagnosis not present

## 2016-10-20 DIAGNOSIS — I10 Essential (primary) hypertension: Secondary | ICD-10-CM | POA: Diagnosis not present

## 2016-12-23 DIAGNOSIS — B351 Tinea unguium: Secondary | ICD-10-CM | POA: Diagnosis not present

## 2016-12-23 DIAGNOSIS — N401 Enlarged prostate with lower urinary tract symptoms: Secondary | ICD-10-CM | POA: Diagnosis not present

## 2016-12-23 DIAGNOSIS — E785 Hyperlipidemia, unspecified: Secondary | ICD-10-CM | POA: Diagnosis not present

## 2016-12-23 DIAGNOSIS — I10 Essential (primary) hypertension: Secondary | ICD-10-CM | POA: Diagnosis not present

## 2016-12-23 DIAGNOSIS — E1140 Type 2 diabetes mellitus with diabetic neuropathy, unspecified: Secondary | ICD-10-CM | POA: Diagnosis not present

## 2017-01-17 DIAGNOSIS — E1140 Type 2 diabetes mellitus with diabetic neuropathy, unspecified: Secondary | ICD-10-CM | POA: Diagnosis not present

## 2017-01-17 DIAGNOSIS — G3184 Mild cognitive impairment, so stated: Secondary | ICD-10-CM | POA: Diagnosis not present

## 2017-01-17 DIAGNOSIS — Z23 Encounter for immunization: Secondary | ICD-10-CM | POA: Diagnosis not present

## 2017-01-17 DIAGNOSIS — I10 Essential (primary) hypertension: Secondary | ICD-10-CM | POA: Diagnosis not present

## 2017-05-05 DIAGNOSIS — E119 Type 2 diabetes mellitus without complications: Secondary | ICD-10-CM | POA: Diagnosis not present

## 2017-05-05 DIAGNOSIS — I10 Essential (primary) hypertension: Secondary | ICD-10-CM | POA: Diagnosis not present

## 2017-05-05 DIAGNOSIS — E0842 Diabetes mellitus due to underlying condition with diabetic polyneuropathy: Secondary | ICD-10-CM | POA: Diagnosis not present

## 2017-05-05 DIAGNOSIS — M5136 Other intervertebral disc degeneration, lumbar region: Secondary | ICD-10-CM | POA: Diagnosis not present

## 2017-05-05 DIAGNOSIS — K219 Gastro-esophageal reflux disease without esophagitis: Secondary | ICD-10-CM | POA: Diagnosis not present

## 2017-05-20 ENCOUNTER — Emergency Department (HOSPITAL_COMMUNITY): Payer: Medicare Other

## 2017-05-20 ENCOUNTER — Encounter (HOSPITAL_COMMUNITY): Payer: Self-pay | Admitting: *Deleted

## 2017-05-20 ENCOUNTER — Emergency Department (HOSPITAL_COMMUNITY)
Admission: EM | Admit: 2017-05-20 | Discharge: 2017-05-20 | Disposition: A | Discharge status: Home / Self Care | Payer: Medicare Other | Attending: Emergency Medicine | Admitting: Emergency Medicine

## 2017-05-20 DIAGNOSIS — M545 Low back pain, unspecified: Secondary | ICD-10-CM

## 2017-05-20 DIAGNOSIS — M25562 Pain in left knee: Secondary | ICD-10-CM | POA: Diagnosis not present

## 2017-05-20 DIAGNOSIS — M25561 Pain in right knee: Secondary | ICD-10-CM | POA: Diagnosis not present

## 2017-05-20 DIAGNOSIS — I509 Heart failure, unspecified: Secondary | ICD-10-CM | POA: Insufficient documentation

## 2017-05-20 DIAGNOSIS — G8929 Other chronic pain: Secondary | ICD-10-CM

## 2017-05-20 DIAGNOSIS — E119 Type 2 diabetes mellitus without complications: Secondary | ICD-10-CM | POA: Diagnosis not present

## 2017-05-20 DIAGNOSIS — Z79899 Other long term (current) drug therapy: Secondary | ICD-10-CM | POA: Diagnosis not present

## 2017-05-20 DIAGNOSIS — M25569 Pain in unspecified knee: Secondary | ICD-10-CM

## 2017-05-20 DIAGNOSIS — I110 Hypertensive heart disease with heart failure: Secondary | ICD-10-CM | POA: Insufficient documentation

## 2017-05-20 MED ORDER — LIDOCAINE 5 % EX PTCH
1.0000 | MEDICATED_PATCH | Freq: Once | CUTANEOUS | Status: DC
  Administered 2017-05-20: 1 via TRANSDERMAL
  Filled 2017-05-20: qty 1

## 2017-05-20 MED ORDER — LIDOCAINE 5 % EX PTCH: 1.0000 | MEDICATED_PATCH | CUTANEOUS | 0 refills | Status: AC

## 2017-05-20 NOTE — ED Notes (Addendum)
Pt reports left knee pain that has been going on now for a month. Pt reports being seen by  Different doctors for this but they have both recommended him to a pain clinic. Pt reports the pain clinic never called him back and he gave up trying to get in touch with them. Pt reports the pain come and goes but gets worse when he stands on it.
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
## 2017-05-20 NOTE — ED Triage Notes (Signed)
Pt c/o difficulty walking since being taken off Oxycodone 1 mth ago, pt has walker with him, denies recent falls, pt smells of strong urine, pt A&O x4
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
## 2017-05-20 NOTE — ED Notes (Signed)
Pt refused to sign because it took to long in his mind and he did not get any narcotics.

## 2017-06-02 DIAGNOSIS — I10 Essential (primary) hypertension: Secondary | ICD-10-CM | POA: Diagnosis not present

## 2017-06-02 DIAGNOSIS — G894 Chronic pain syndrome: Secondary | ICD-10-CM | POA: Diagnosis not present

## 2017-06-02 DIAGNOSIS — N401 Enlarged prostate with lower urinary tract symptoms: Secondary | ICD-10-CM | POA: Diagnosis not present

## 2017-06-02 DIAGNOSIS — E119 Type 2 diabetes mellitus without complications: Secondary | ICD-10-CM | POA: Diagnosis not present

## 2017-06-04 ENCOUNTER — Encounter (HOSPITAL_COMMUNITY): Payer: Self-pay

## 2017-06-04 ENCOUNTER — Emergency Department (HOSPITAL_COMMUNITY)
Admission: EM | Admit: 2017-06-04 | Discharge: 2017-06-04 | Disposition: A | Discharge status: Home / Self Care | Payer: Medicare Other | Attending: Emergency Medicine | Admitting: Emergency Medicine

## 2017-06-04 ENCOUNTER — Other Ambulatory Visit: Payer: Self-pay

## 2017-06-04 ENCOUNTER — Emergency Department (HOSPITAL_COMMUNITY): Payer: Medicare Other

## 2017-06-04 DIAGNOSIS — S0990XA Unspecified injury of head, initial encounter: Secondary | ICD-10-CM | POA: Diagnosis not present

## 2017-06-04 DIAGNOSIS — Y9389 Activity, other specified: Secondary | ICD-10-CM | POA: Insufficient documentation

## 2017-06-04 DIAGNOSIS — I509 Heart failure, unspecified: Secondary | ICD-10-CM | POA: Insufficient documentation

## 2017-06-04 DIAGNOSIS — R030 Elevated blood-pressure reading, without diagnosis of hypertension: Secondary | ICD-10-CM | POA: Diagnosis not present

## 2017-06-04 DIAGNOSIS — E1140 Type 2 diabetes mellitus with diabetic neuropathy, unspecified: Secondary | ICD-10-CM | POA: Insufficient documentation

## 2017-06-04 DIAGNOSIS — Y999 Unspecified external cause status: Secondary | ICD-10-CM | POA: Diagnosis not present

## 2017-06-04 DIAGNOSIS — M542 Cervicalgia: Secondary | ICD-10-CM | POA: Diagnosis not present

## 2017-06-04 DIAGNOSIS — Z79899 Other long term (current) drug therapy: Secondary | ICD-10-CM | POA: Insufficient documentation

## 2017-06-04 DIAGNOSIS — Y92009 Unspecified place in unspecified non-institutional (private) residence as the place of occurrence of the external cause: Secondary | ICD-10-CM | POA: Diagnosis not present

## 2017-06-04 DIAGNOSIS — I110 Hypertensive heart disease with heart failure: Secondary | ICD-10-CM | POA: Diagnosis not present

## 2017-06-04 DIAGNOSIS — R51 Headache: Secondary | ICD-10-CM | POA: Diagnosis not present

## 2017-06-04 DIAGNOSIS — S199XXA Unspecified injury of neck, initial encounter: Secondary | ICD-10-CM | POA: Diagnosis not present

## 2017-06-04 MED ORDER — ACETAMINOPHEN 500 MG PO TABS
1000.0000 mg | ORAL_TABLET | Freq: Once | ORAL | Status: AC
  Administered 2017-06-04: 1000 mg via ORAL
  Filled 2017-06-04: qty 2
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
## 2017-07-01 ENCOUNTER — Encounter (HOSPITAL_COMMUNITY): Payer: Self-pay | Admitting: Emergency Medicine

## 2017-07-01 ENCOUNTER — Emergency Department (HOSPITAL_COMMUNITY)
Admission: EM | Admit: 2017-07-01 | Discharge: 2017-07-01 | Disposition: A | Discharge status: Home / Self Care | Payer: Medicare Other | Attending: Emergency Medicine | Admitting: Emergency Medicine

## 2017-07-01 DIAGNOSIS — Z952 Presence of prosthetic heart valve: Secondary | ICD-10-CM | POA: Insufficient documentation

## 2017-07-01 DIAGNOSIS — R350 Frequency of micturition: Secondary | ICD-10-CM | POA: Diagnosis not present

## 2017-07-01 DIAGNOSIS — Z79899 Other long term (current) drug therapy: Secondary | ICD-10-CM | POA: Diagnosis not present

## 2017-07-01 DIAGNOSIS — R300 Dysuria: Secondary | ICD-10-CM

## 2017-07-01 DIAGNOSIS — D649 Anemia, unspecified: Secondary | ICD-10-CM | POA: Diagnosis not present

## 2017-07-01 DIAGNOSIS — E119 Type 2 diabetes mellitus without complications: Secondary | ICD-10-CM | POA: Diagnosis not present

## 2017-07-01 DIAGNOSIS — Z7984 Long term (current) use of oral hypoglycemic drugs: Secondary | ICD-10-CM | POA: Diagnosis not present

## 2017-07-01 DIAGNOSIS — I10 Essential (primary) hypertension: Secondary | ICD-10-CM | POA: Insufficient documentation

## 2017-07-01 DIAGNOSIS — R42 Dizziness and giddiness: Secondary | ICD-10-CM | POA: Insufficient documentation

## 2017-07-01 DIAGNOSIS — I509 Heart failure, unspecified: Secondary | ICD-10-CM | POA: Insufficient documentation

## 2017-07-01 DIAGNOSIS — R404 Transient alteration of awareness: Secondary | ICD-10-CM | POA: Diagnosis not present

## 2017-07-01 LAB — COMPREHENSIVE METABOLIC PANEL
ALT: 18 U/L (ref 17–63)
AST: 24 U/L (ref 15–41)
Albumin: 3.5 g/dL (ref 3.5–5.0)
Alkaline Phosphatase: 57 U/L (ref 38–126)
Anion gap: 8 (ref 5–15)
BUN: 28 mg/dL — AB (ref 6–20)
CHLORIDE: 108 mmol/L (ref 101–111)
CO2: 26 mmol/L (ref 22–32)
Calcium: 8.9 mg/dL (ref 8.9–10.3)
Creatinine, Ser: 1.34 mg/dL — ABNORMAL HIGH (ref 0.61–1.24)
GFR, EST AFRICAN AMERICAN: 58 mL/min — AB (ref >60)
GFR, EST NON AFRICAN AMERICAN: 50 mL/min — AB (ref >60)
Glucose, Bld: 175 mg/dL — ABNORMAL HIGH (ref 65–99)
POTASSIUM: 4 mmol/L (ref 3.5–5.1)
SODIUM: 142 mmol/L (ref 135–145)
Total Bilirubin: 0.7 mg/dL (ref 0.3–1.2)
Total Protein: 6.9 g/dL (ref 6.5–8.1)

## 2017-07-01 LAB — CBC WITH DIFFERENTIAL/PLATELET
BASOS ABS: 0 10*3/uL (ref 0.0–0.1)
Basophils Relative: 0 %
Eosinophils Absolute: 0.3 10*3/uL (ref 0.0–0.7)
Eosinophils Relative: 4 %
HCT: 32.9 % — ABNORMAL LOW (ref 39.0–52.0)
Hemoglobin: 10.2 g/dL — ABNORMAL LOW (ref 13.0–17.0)
LYMPHS PCT: 20 %
Lymphs Abs: 1.3 10*3/uL (ref 0.7–4.0)
MCH: 29.1 pg (ref 26.0–34.0)
MCHC: 31 g/dL (ref 30.0–36.0)
MCV: 93.7 fL (ref 78.0–100.0)
MONO ABS: 0.4 10*3/uL (ref 0.1–1.0)
Monocytes Relative: 6 %
Neutro Abs: 4.8 10*3/uL (ref 1.7–7.7)
Neutrophils Relative %: 70 %
PLATELETS: 195 10*3/uL (ref 150–400)
RBC: 3.51 MIL/uL — AB (ref 4.22–5.81)
RDW: 15.9 % — AB (ref 11.5–15.5)
WBC: 6.8 10*3/uL (ref 4.0–10.5)

## 2017-07-01 LAB — URINALYSIS, ROUTINE W REFLEX MICROSCOPIC
Bilirubin Urine: NEGATIVE
GLUCOSE, UA: NEGATIVE mg/dL
HGB URINE DIPSTICK: NEGATIVE
Ketones, ur: 5 mg/dL — AB
Nitrite: NEGATIVE
PROTEIN: 30 mg/dL — AB
Specific Gravity, Urine: 1.028 (ref 1.005–1.030)
pH: 5 (ref 5.0–8.0)

## 2017-07-01 LAB — CK: CK TOTAL: 77 U/L (ref 49–397)

## 2017-07-01 LAB — CBG MONITORING, ED: GLUCOSE-CAPILLARY: 156 mg/dL — AB (ref 65–99)

## 2017-07-01 MED ORDER — SODIUM CHLORIDE 0.9 % IV SOLN
INTRAVENOUS | Status: DC
  Administered 2017-07-01: 19:00:00 via INTRAVENOUS

## 2017-07-01 NOTE — ED Triage Notes (Signed)
Per [facility] pt from home for urinary frequency and dark urine for past week. Had dizziness but changed medications that have those side effects.

## 2017-07-01 NOTE — ED Provider Notes (Signed)
[facility]-EMERGENCY DEPT Provider Note   CSN: [id number] Arrival date & time: 07/01/17  1248     History   Chief Complaint Chief Complaint  Patient presents with  . Urinary Frequency  . dark urine    HPI [name] is a 77 y.o. male.  HPI Patient presents to the emergency room for evaluation of weakness dizziness and discolored urine.  Patient states over the last few weeks he has had some generalized dizziness.  He thinks this all started after some recent changes in his medications.  Patient feels that his symptoms get worse when he takes his medications.  Patient denies any vomiting.  No diarrhea.  He has noticed some dysuria and dark-colored urine in the last week.  Patient dates today the symptoms were a little worse and he fell back in his chair when he tried to get up.  He did not injure himself or hit his head. Past Medical History:  Diagnosis Date  . Aortic stenosis   . Arthritis   . CHF (congestive heart failure) (HCC)   . Chicken pox   . Depression   . Diabetes mellitus without complication (HCC)   . Heart murmur   . History of fainting spells of unknown cause   . Hyperlipidemia   . Hypertension   . Neuromuscular disorder (HCC)   . Urinary incontinence     Patient Active Problem List   Diagnosis Date Noted  . Multiple falls 12/23/2014  . DDD (degenerative disc disease), lumbar 09/09/2014  . Facet syndrome, lumbar 09/09/2014  . Spinal stenosis, lumbar region, with neurogenic claudication 09/09/2014  . Neuropathy due to secondary diabetes (HCC) 09/09/2014  . Upper GI bleed   . Acute blood loss anemia   . Abdominal aortic aneurysm (HCC)   . Essential hypertension   . Adjustment disorder with disturbance of conduct   . Chronic pain syndrome   . Urinary frequency   . Hemorrhagic shock (HCC) 07/04/2014  . S/P AVR (aortic valve replacement) 07/04/2014  . Chest pain 07/04/2014  . Gastrointestinal hemorrhage 07/04/2014  .  Melena   . Acute kidney injury (HCC)   . Aortic stenosis   . Atrial fibrillation, unspecified   . Dental caries   . Pyrexia   . Aortic valve stenosis, critical 04/20/2014  . Syncope and collapse 04/18/2014  . Dehydration 04/18/2014  . Hypotension 04/18/2014  . Lethargic 03/25/2014  . Adjustment disorder with mixed anxiety and depressed mood 03/25/2014  . Chronic back pain 09/28/2013  . Mild cognitive impairment 09/28/2013  . DM2 (diabetes mellitus, type 2) (HCC) 09/28/2013  . HLD (hyperlipidemia) 09/28/2013  . GERD (gastroesophageal reflux disease) 09/28/2013    Past Surgical History:  Procedure Laterality Date  . AORTIC VALVE REPLACEMENT N/A 04/26/2014   Procedure: AORTIC VALVE REPLACEMENT (AVR);  Surgeon: Loreli Slot, MD;  Location: [facility] OR;  Service: Open Heart Surgery;  Laterality: N/A;  . CARDIAC CATHETERIZATION  03/2014  . ESOPHAGOGASTRODUODENOSCOPY N/A 07/04/2014   Procedure: ESOPHAGOGASTRODUODENOSCOPY (EGD);  Surgeon: Jeani Hawking, MD;  Location: [facility] ENDOSCOPY;  Service: Endoscopy;  Laterality: N/A;  . goiter  2008  . MAZE N/A 04/26/2014   Procedure: MAZE;  Surgeon: Loreli Slot, MD;  Location: [facility] OR;  Service: Open Heart Surgery;  Laterality: N/A;  . MULTIPLE EXTRACTIONS WITH ALVEOLOPLASTY N/A 04/24/2014   Procedure: Extraction of tooth #'s 2,3,5,12,13,14,15,20 with allveoloplasty and gross debridement of remaining dentition and sectioning of bridge at mesial of abutment tooth #32.;  Surgeon: Derrell Lolling  Kulinski, DDS;  Location: [facility] OR;  Service: Oral Surgery;  Laterality: N/A;  . TEE WITHOUT CARDIOVERSION N/A 04/26/2014   Procedure: TRANSESOPHAGEAL ECHOCARDIOGRAM (TEE);  Surgeon: Loreli SlotSteven C Hendrickson, MD;  [facility];  Service: Open Heart Surgery;  Laterality: N/A;        Home Medications    Prior to Admission medications   Medication Sig Start Date End Date Taking? Authorizing Provider  donepezil (ARICEPT) 10 MG tablet Take 1 tablet (10 mg total) by  mouth at bedtime. 07/31/14  Yes Gollan, Tollie Pizzaimothy J, MD  gabapentin (NEURONTIN) 400 MG capsule Take 400 mg by mouth 4 (four) times daily.    Yes [provider]  meloxicam (MOBIC) 15 MG tablet Take 15 mg by mouth daily.   Yes [provider]  metFORMIN (GLUCOPHAGE) 500 MG tablet Take 500 mg by mouth 2 (two) times daily with a meal.   Yes [provider]  metoprolol tartrate (LOPRESSOR) 25 MG tablet Take 37.5 mg by mouth 2 (two) times daily.    Yes [provider]  potassium chloride (K-DUR) 10 MEQ tablet Take 10 mEq by mouth daily.   Yes [provider]  sertraline (ZOLOFT) 50 MG tablet Take 1 tablet (50 mg total) by mouth daily. 07/31/14  Yes Antonieta IbaGollan, Timothy J, MD  simvastatin (ZOCOR) 20 MG tablet Take 20 mg by mouth at bedtime.    Yes [provider]  tamsulosin (FLOMAX) 0.4 MG CAPS capsule Take 1 capsule (0.4 mg total) by mouth daily. 07/31/14  Yes Gollan, Tollie Pizzaimothy J, MD  tiZANidine (ZANAFLEX) 4 MG tablet Take 4 mg by mouth 2 (two) times daily as needed for muscle spasms.   Yes [provider]  traZODone (DESYREL) 150 MG tablet Take 150 mg by mouth at bedtime.   Yes [provider]  erythromycin ophthalmic ointment Place into the right eye 4 (four) times daily. For 7 days Patient not taking: Reported on 07/01/2017 12/24/14   Shaune Pollackhen, Qing, MD  furosemide (LASIX) 20 MG tablet Take 1 tablet (20 mg total) by mouth daily. Patient not taking: Reported on 07/01/2017 07/31/14   Antonieta IbaGollan, Timothy J, MD  omeprazole (PRILOSEC) 20 MG capsule Take 1 capsule (20 mg total) by mouth daily. Patient not taking: Reported on 07/01/2017 07/31/14   Antonieta IbaGollan, Timothy J, MD  Oxycodone HCl 10 MG TABS Take 10 mg by mouth every 6 (six) hours as needed (for pain).    [provider]    Family History Family History  Problem Relation Age of Onset  . Alcohol abuse Mother   . Heart disease Mother   . Diabetes Mother   . Heart disease Father   . Diabetes  Father   . Cancer Mother   . Cancer Father   . Alcohol abuse Father   . Alcohol abuse Brother     Social History Social History   Tobacco Use  . Smoking status: Never Smoker  . Smokeless tobacco: Never Used  Substance Use Topics  . Alcohol use: Yes    Alcohol/week: 0.0 oz  . Drug use: Yes    Types: Marijuana    Comment: once a month     Allergies   Patient has no known allergies.   Review of Systems Review of Systems  Constitutional: Negative for fever.  Respiratory: Negative for choking and shortness of breath.   Cardiovascular: Negative for chest pain.  Gastrointestinal: Negative for blood in stool and vomiting.  Genitourinary: Negative for dysuria.  All other systems reviewed and  are negative.    Physical Exam Updated Vital Signs BP (!) 151/88   Pulse 82   Temp 97.9 F (36.6 C) (Oral)   Resp 18   SpO2 99%   Physical Exam  Constitutional: He is oriented to person, place, and time. He appears well-developed and well-nourished. No distress.  HENT:  Head: Normocephalic and atraumatic.  Right Ear: External ear normal.  Left Ear: External ear normal.  Mouth/Throat: Oropharynx is clear and moist.  Eyes: Conjunctivae are normal. Right eye exhibits no discharge. Left eye exhibits no discharge. No scleral icterus.  Neck: Neck supple. No tracheal deviation present.  Cardiovascular: Normal rate, regular rhythm and intact distal pulses.  Pulmonary/Chest: Effort normal and breath sounds normal. No stridor. No respiratory distress. He has no wheezes. He has no rales.  Abdominal: Soft. Bowel sounds are normal. He exhibits no distension. There is no tenderness. There is no rebound and no guarding.  Musculoskeletal: He exhibits no edema or tenderness.  Neurological: He is alert and oriented to person, place, and time. He has normal strength. No cranial nerve deficit (no facial droop, extraocular movements intact, no slurred speech) or sensory deficit. He exhibits normal  muscle tone. He displays no seizure activity. Coordination normal.  No pronator drift bilateral upper extrem, able to hold both legs off bed for 5 seconds, sensation intact in all extremities, no visual field cuts, no left or right sided neglect, normal finger-nose exam bilaterally, no nystagmus noted   Skin: Skin is warm and dry. No rash noted. He is not diaphoretic.  Psychiatric: He has a normal mood and affect.  Nursing note and vitals reviewed.    ED Treatments / Results  Labs (all labs ordered are listed, but only abnormal results are displayed) Labs Reviewed  URINALYSIS, ROUTINE W REFLEX MICROSCOPIC - Abnormal; Notable for the following components:      Result Value   Color, Urine AMBER (*)    Ketones, ur 5 (*)    Protein, ur 30 (*)    Leukocytes, UA MODERATE (*)    Bacteria, UA RARE (*)    Squamous Epithelial / LPF 0-5 (*)    All other components within normal limits  CBC WITH DIFFERENTIAL/PLATELET - Abnormal; Notable for the following components:   RBC 3.51 (*)    Hemoglobin 10.2 (*)    HCT 32.9 (*)    RDW 15.9 (*)    All other components within normal limits  COMPREHENSIVE METABOLIC PANEL - Abnormal; Notable for the following components:   Glucose, Bld 175 (*)    BUN 28 (*)    Creatinine, Ser 1.34 (*)    GFR calc non Af Amer 50 (*)    GFR calc Af Amer 58 (*)    All other components within normal limits  CBG MONITORING, ED - Abnormal; Notable for the following components:   Glucose-Capillary 156 (*)    All other components within normal limits  URINE CULTURE  CK    EKG EKG Interpretation  Date/Time:  Friday July 01 2017 18:32:48 EDT Ventricular Rate:  76 PR Interval:    QRS Duration: 157 QT Interval:  466 QTC Calculation: 524 R Axis:   -47 Text Interpretation:  Atrial fibrillation Right bundle branch block Inferior infarct, old Confirmed by Linwood Dibbles [id number]) on 07/01/2017 6:36:39 PM Atrial fibrillation noted in his medical  history.   Procedures Procedures (including critical care time)  Medications Ordered in ED Medications  0.9 %  sodium chloride infusion ( Intravenous New Bag/Given 07/01/17  1841)     Initial Impression / Assessment and Plan / ED Course  I have reviewed the triage vital signs and the nursing notes.  Pertinent labs & imaging results that were available during my care of the patient were reviewed by me and considered in my medical decision making (see chart for details).   Patient presented to the emergency room for several complaints.  The symptoms have been going on for several weeks.  Patient feels like it is related to some new medications that he was prescribed a few weeks ago.  Patient feels like he is on too many medications.  Reviewed his medication list.  He is on several psychiatric medications.  It is possible he could be contributing to some of his lightheadedness.  I doubt that is related to his low.  Patient is hemoglobin is lower than it has been in the past however I do not think that would cause his dizziness.  He also denies any rectal bleeding or blood in his stool.  Patient's urinalysis slightly abnormal but not clearly an infection.  I will send off a urine culture.  I recommended close follow-up with his primary care doctor to discuss his medications further.  Final Clinical Impressions(s) / ED Diagnoses   Final diagnoses:  Dysuria  Anemia, unspecified type    ED Discharge Orders    None       Linwood Dibbles, MD 07/01/17 2010

## 2017-07-01 NOTE — Discharge Instructions (Addendum)
Follow-up with your primary care doctor for further evaluation, you or your medications and to check on your anemia..  Monitor for fever or worsening symptoms Or fif you start noticing blood in your stools

## 2017-07-03 LAB — URINE CULTURE

## 2017-07-04 ENCOUNTER — Telehealth: Payer: Self-pay | Admitting: *Deleted

## 2017-07-04 NOTE — Telephone Encounter (Signed)
Post ED Visit - Positive Culture Follow-up: Unsuccessful Patient Follow-up  Culture assessed and recommendations reviewed by:  [x]  Enzo BiNathan Batchelder, Pharm.D. []  Celedonio MiyamotoJeremy Frens, Pharm.D., BCPS AQ-ID []  Garvin FilaMike Maccia, Pharm.D., BCPS []  Georgina PillionElizabeth Martin, Pharm.D., BCPS []  Mallard BayMinh Pham, [address] Rainbow BoulevardPharm.D., BCPS, AAHIVP []  Estella HuskMichelle Turner, Pharm.D., BCPS, AAHIVP []  Lysle Pearlachel Rumbarger, PharmD, BCPS []  Casilda Carlsaylor Stone, PharmD, BCPS []  Pollyann SamplesAndy Johnston, PharmD, BCPS  Positive urine culture  [x]  Patient discharged without antimicrobial prescription and treatment is now indicated []  Organism is resistant to prescribed ED discharge antimicrobial []  Patient with positive blood cultures  Needs Cephalexin 500 mg PO BID x 7 days  Unable to contact patient after 3 attempts, letter will be sent to address on file  Lysle PearlRobertson, Katleen Carraway Talley 07/04/2017, 9:42 AM

## 2017-07-06 ENCOUNTER — Emergency Department (HOSPITAL_COMMUNITY): Payer: Medicare Other

## 2017-07-06 ENCOUNTER — Inpatient Hospital Stay (HOSPITAL_COMMUNITY)
Admission: EM | Admit: 2017-07-06 | Discharge: 2017-07-09 | DRG: 071 | Disposition: A | Discharge status: Home / Self Care | Payer: Medicare Other | Attending: Internal Medicine | Admitting: Internal Medicine

## 2017-07-06 ENCOUNTER — Encounter (HOSPITAL_COMMUNITY): Payer: Self-pay

## 2017-07-06 DIAGNOSIS — G8929 Other chronic pain: Secondary | ICD-10-CM | POA: Diagnosis not present

## 2017-07-06 DIAGNOSIS — K219 Gastro-esophageal reflux disease without esophagitis: Secondary | ICD-10-CM | POA: Diagnosis not present

## 2017-07-06 DIAGNOSIS — Z008 Encounter for other general examination: Secondary | ICD-10-CM

## 2017-07-06 DIAGNOSIS — D638 Anemia in other chronic diseases classified elsewhere: Secondary | ICD-10-CM | POA: Diagnosis not present

## 2017-07-06 DIAGNOSIS — Z7984 Long term (current) use of oral hypoglycemic drugs: Secondary | ICD-10-CM | POA: Diagnosis not present

## 2017-07-06 DIAGNOSIS — F419 Anxiety disorder, unspecified: Secondary | ICD-10-CM | POA: Diagnosis not present

## 2017-07-06 DIAGNOSIS — G4700 Insomnia, unspecified: Secondary | ICD-10-CM | POA: Diagnosis not present

## 2017-07-06 DIAGNOSIS — N179 Acute kidney failure, unspecified: Secondary | ICD-10-CM | POA: Diagnosis not present

## 2017-07-06 DIAGNOSIS — Z811 Family history of alcohol abuse and dependence: Secondary | ICD-10-CM | POA: Diagnosis not present

## 2017-07-06 DIAGNOSIS — R450 Nervousness: Secondary | ICD-10-CM | POA: Diagnosis not present

## 2017-07-06 DIAGNOSIS — E785 Hyperlipidemia, unspecified: Secondary | ICD-10-CM | POA: Diagnosis present

## 2017-07-06 DIAGNOSIS — F0390 Unspecified dementia without behavioral disturbance: Secondary | ICD-10-CM | POA: Diagnosis present

## 2017-07-06 DIAGNOSIS — Z952 Presence of prosthetic heart valve: Secondary | ICD-10-CM

## 2017-07-06 DIAGNOSIS — G9340 Encephalopathy, unspecified: Principal | ICD-10-CM | POA: Diagnosis present

## 2017-07-06 DIAGNOSIS — I5032 Chronic diastolic (congestive) heart failure: Secondary | ICD-10-CM | POA: Diagnosis present

## 2017-07-06 DIAGNOSIS — E1165 Type 2 diabetes mellitus with hyperglycemia: Secondary | ICD-10-CM | POA: Diagnosis present

## 2017-07-06 DIAGNOSIS — I959 Hypotension, unspecified: Secondary | ICD-10-CM | POA: Diagnosis not present

## 2017-07-06 DIAGNOSIS — Z5320 Procedure and treatment not carried out because of patient's decision for unspecified reasons: Secondary | ICD-10-CM | POA: Diagnosis not present

## 2017-07-06 DIAGNOSIS — R451 Restlessness and agitation: Secondary | ICD-10-CM | POA: Diagnosis not present

## 2017-07-06 DIAGNOSIS — E860 Dehydration: Secondary | ICD-10-CM | POA: Diagnosis not present

## 2017-07-06 DIAGNOSIS — E118 Type 2 diabetes mellitus with unspecified complications: Secondary | ICD-10-CM | POA: Diagnosis not present

## 2017-07-06 DIAGNOSIS — I110 Hypertensive heart disease with heart failure: Secondary | ICD-10-CM | POA: Diagnosis present

## 2017-07-06 DIAGNOSIS — F4323 Adjustment disorder with mixed anxiety and depressed mood: Secondary | ICD-10-CM | POA: Diagnosis present

## 2017-07-06 DIAGNOSIS — I4581 Long QT syndrome: Secondary | ICD-10-CM | POA: Diagnosis present

## 2017-07-06 DIAGNOSIS — Z79899 Other long term (current) drug therapy: Secondary | ICD-10-CM | POA: Diagnosis not present

## 2017-07-06 DIAGNOSIS — F329 Major depressive disorder, single episode, unspecified: Secondary | ICD-10-CM | POA: Diagnosis not present

## 2017-07-06 DIAGNOSIS — N390 Urinary tract infection, site not specified: Secondary | ICD-10-CM | POA: Diagnosis present

## 2017-07-06 DIAGNOSIS — F4321 Adjustment disorder with depressed mood: Secondary | ICD-10-CM | POA: Diagnosis present

## 2017-07-06 DIAGNOSIS — D649 Anemia, unspecified: Secondary | ICD-10-CM | POA: Diagnosis present

## 2017-07-06 DIAGNOSIS — R031 Nonspecific low blood-pressure reading: Secondary | ICD-10-CM | POA: Diagnosis not present

## 2017-07-06 DIAGNOSIS — M549 Dorsalgia, unspecified: Secondary | ICD-10-CM | POA: Diagnosis present

## 2017-07-06 DIAGNOSIS — R4182 Altered mental status, unspecified: Secondary | ICD-10-CM

## 2017-07-06 DIAGNOSIS — F1290 Cannabis use, unspecified, uncomplicated: Secondary | ICD-10-CM | POA: Diagnosis not present

## 2017-07-06 DIAGNOSIS — E1129 Type 2 diabetes mellitus with other diabetic kidney complication: Secondary | ICD-10-CM

## 2017-07-06 DIAGNOSIS — J984 Other disorders of lung: Secondary | ICD-10-CM | POA: Diagnosis not present

## 2017-07-06 LAB — I-STAT CHEM 8, ED
BUN: 23 mg/dL — ABNORMAL HIGH (ref 6–20)
Calcium, Ion: 1.18 mmol/L (ref 1.15–1.40)
Chloride: 106 mmol/L (ref 101–111)
Creatinine, Ser: 1.5 mg/dL — ABNORMAL HIGH (ref 0.61–1.24)
Glucose, Bld: 187 mg/dL — ABNORMAL HIGH (ref 65–99)
HEMATOCRIT: 33 % — AB (ref 39.0–52.0)
HEMOGLOBIN: 11.2 g/dL — AB (ref 13.0–17.0)
POTASSIUM: 4.4 mmol/L (ref 3.5–5.1)
Sodium: 140 mmol/L (ref 135–145)
TCO2: 25 mmol/L (ref 22–32)

## 2017-07-06 LAB — URINALYSIS, ROUTINE W REFLEX MICROSCOPIC
BILIRUBIN URINE: NEGATIVE
Glucose, UA: 50 mg/dL — AB
HGB URINE DIPSTICK: NEGATIVE
KETONES UR: NEGATIVE mg/dL
LEUKOCYTES UA: NEGATIVE
Nitrite: NEGATIVE
Protein, ur: 100 mg/dL — AB
Specific Gravity, Urine: 1.024 (ref 1.005–1.030)
pH: 6 (ref 5.0–8.0)

## 2017-07-06 LAB — I-STAT VENOUS BLOOD GAS, ED
BICARBONATE: 25.2 mmol/L (ref 20.0–28.0)
O2 Saturation: 53 %
PH VEN: 7.362 (ref 7.250–7.430)
TCO2: 27 mmol/L (ref 22–32)
pCO2, Ven: 44.4 mmHg (ref 44.0–60.0)
pO2, Ven: 29 mmHg — CL (ref 32.0–45.0)

## 2017-07-06 LAB — I-STAT TROPONIN, ED: TROPONIN I, POC: 0 ng/mL (ref 0.00–0.08)

## 2017-07-06 LAB — COMPREHENSIVE METABOLIC PANEL
ALT: 16 U/L — AB (ref 17–63)
AST: 23 U/L (ref 15–41)
Albumin: 3.4 g/dL — ABNORMAL LOW (ref 3.5–5.0)
Alkaline Phosphatase: 57 U/L (ref 38–126)
Anion gap: 8 (ref 5–15)
BILIRUBIN TOTAL: 0.9 mg/dL (ref 0.3–1.2)
BUN: 23 mg/dL — ABNORMAL HIGH (ref 6–20)
CO2: 28 mmol/L (ref 22–32)
Calcium: 9 mg/dL (ref 8.9–10.3)
Chloride: 104 mmol/L (ref 101–111)
Creatinine, Ser: 1.69 mg/dL — ABNORMAL HIGH (ref 0.61–1.24)
GFR calc non Af Amer: 38 mL/min — ABNORMAL LOW (ref >60)
GFR, EST AFRICAN AMERICAN: 44 mL/min — AB (ref >60)
Glucose, Bld: 199 mg/dL — ABNORMAL HIGH (ref 65–99)
Potassium: 5 mmol/L (ref 3.5–5.1)
SODIUM: 140 mmol/L (ref 135–145)
Total Protein: 6.5 g/dL (ref 6.5–8.1)

## 2017-07-06 LAB — CBC WITH DIFFERENTIAL/PLATELET
BASOS ABS: 0 10*3/uL (ref 0.0–0.1)
Basophils Relative: 0 %
Eosinophils Absolute: 0.2 10*3/uL (ref 0.0–0.7)
Eosinophils Relative: 2 %
HEMATOCRIT: 35.2 % — AB (ref 39.0–52.0)
HEMOGLOBIN: 10.9 g/dL — AB (ref 13.0–17.0)
LYMPHS ABS: 1.2 10*3/uL (ref 0.7–4.0)
LYMPHS PCT: 14 %
MCH: 29 pg (ref 26.0–34.0)
MCHC: 31 g/dL (ref 30.0–36.0)
MCV: 93.6 fL (ref 78.0–100.0)
Monocytes Absolute: 0.6 10*3/uL (ref 0.1–1.0)
Monocytes Relative: 7 %
NEUTROS ABS: 6.4 10*3/uL (ref 1.7–7.7)
Neutrophils Relative %: 77 %
Platelets: 257 10*3/uL (ref 150–400)
RBC: 3.76 MIL/uL — AB (ref 4.22–5.81)
RDW: 15.7 % — ABNORMAL HIGH (ref 11.5–15.5)
WBC: 8.4 10*3/uL (ref 4.0–10.5)

## 2017-07-06 LAB — I-STAT CG4 LACTIC ACID, ED: Lactic Acid, Venous: 1.57 mmol/L (ref 0.5–1.9)

## 2017-07-06 LAB — CBG MONITORING, ED: Glucose-Capillary: 180 mg/dL — ABNORMAL HIGH (ref 65–99)

## 2017-07-06 LAB — AMMONIA: AMMONIA: 30 umol/L (ref 9–35)

## 2017-07-06 MED ORDER — SODIUM CHLORIDE 0.9 % IV SOLN
2.0000 g | INTRAVENOUS | Status: DC
  Administered 2017-07-06 – 2017-07-07 (×2): 2 g via INTRAVENOUS
  Filled 2017-07-06 (×3): qty 20

## 2017-07-06 MED ORDER — LACTATED RINGERS IV BOLUS (SEPSIS)
1000.0000 mL | Freq: Once | INTRAVENOUS | Status: AC
  Administered 2017-07-07: 1000 mL via INTRAVENOUS

## 2017-07-06 MED ORDER — LACTATED RINGERS IV BOLUS (SEPSIS)
1000.0000 mL | Freq: Once | INTRAVENOUS | Status: AC
  Administered 2017-07-06: 1000 mL via INTRAVENOUS

## 2017-07-06 NOTE — ED Triage Notes (Signed)
Pt arrived via [facility]; per EMS pt brought to ED after call from friend for AMS; upon arrival EMS noted BP to be 66/40; pt A/Ox1; pt rec'd NS in route to hosp. CBG 189

## 2017-07-06 NOTE — ED Provider Notes (Signed)
[facility] EMERGENCY DEPARTMENT Provider Note   CSN: [id number] Arrival date & time: 07/06/17  2028     History   Chief Complaint Chief Complaint  Patient presents with  . Altered Mental Status    HPI Peter Weiss is a 77 y.o. male.  The history is provided by medical records and the EMS personnel.  Altered Mental Status   This is a new problem. Episode onset: unable to specify. The problem has not changed since onset.Associated symptoms include confusion and weakness. Risk factors include a recent infection. His past medical history does not include seizures.    Past Medical History:  Diagnosis Date  . Aortic stenosis   . Arthritis   . CHF (congestive heart failure) (HCC)   . Chicken pox   . Depression   . Diabetes mellitus without complication (HCC)   . Heart murmur   . History of fainting spells of unknown cause   . Hyperlipidemia   . Hypertension   . Neuromuscular disorder (HCC)   . Urinary incontinence     Patient Active Problem List   Diagnosis Date Noted  . Multiple falls 12/23/2014  . DDD (degenerative disc disease), lumbar 09/09/2014  . Facet syndrome, lumbar 09/09/2014  . Spinal stenosis, lumbar region, with neurogenic claudication 09/09/2014  . Neuropathy due to secondary diabetes (HCC) 09/09/2014  . Upper GI bleed   . Acute blood loss anemia   . Abdominal aortic aneurysm (HCC)   . Essential hypertension   . Adjustment disorder with disturbance of conduct   . Chronic pain syndrome   . Urinary frequency   . Hemorrhagic shock (HCC) 07/04/2014  . S/P AVR (aortic valve replacement) 07/04/2014  . Chest pain 07/04/2014  . Gastrointestinal hemorrhage 07/04/2014  . Melena   . Acute kidney injury (HCC)   . Aortic stenosis   . Atrial fibrillation, unspecified   . Dental caries   . Pyrexia   . Aortic valve stenosis, critical 04/20/2014  . Syncope and collapse 04/18/2014  . Dehydration 04/18/2014  . Hypotension 04/18/2014    . Lethargic 03/25/2014  . Adjustment disorder with mixed anxiety and depressed mood 03/25/2014  . Chronic back pain 09/28/2013  . Mild cognitive impairment 09/28/2013  . DM2 (diabetes mellitus, type 2) (HCC) 09/28/2013  . HLD (hyperlipidemia) 09/28/2013  . GERD (gastroesophageal reflux disease) 09/28/2013    Past Surgical History:  Procedure Laterality Date  . AORTIC VALVE REPLACEMENT N/A 04/26/2014   Procedure: AORTIC VALVE REPLACEMENT (AVR);  Surgeon: Loreli Slot, MD;  Location: [facility] OR;  Service: Open Heart Surgery;  Laterality: N/A;  . CARDIAC CATHETERIZATION  03/2014  . ESOPHAGOGASTRODUODENOSCOPY N/A 07/04/2014   Procedure: ESOPHAGOGASTRODUODENOSCOPY (EGD);  Surgeon: Jeani Hawking, MD;  Location: [facility] ENDOSCOPY;  Service: Endoscopy;  Laterality: N/A;  . goiter  2008  . MAZE N/A 04/26/2014   Procedure: MAZE;  Surgeon: Loreli Slot, MD;  Location: [facility] OR;  Service: Open Heart Surgery;  Laterality: N/A;  . MULTIPLE EXTRACTIONS WITH ALVEOLOPLASTY N/A 04/24/2014   Procedure: Extraction of tooth #'s 2,3,5,12,13,14,15,20 with allveoloplasty and gross debridement of remaining dentition and sectioning of bridge at mesial of abutment tooth #32.;  Surgeon: Charlynne Pander, DDS;  Location: [facility] OR;  Service: Oral Surgery;  Laterality: N/A;  . TEE WITHOUT CARDIOVERSION N/A 04/26/2014   Procedure: TRANSESOPHAGEAL ECHOCARDIOGRAM (TEE);  Surgeon: Loreli Slot, MD;  Location: [facility] OR;  Service: Open Heart Surgery;  Laterality: N/A;        Home Medications  Prior to Admission medications   Medication Sig Start Date End Date Taking? Authorizing Provider  donepezil (ARICEPT) 10 MG tablet Take 1 tablet (10 mg total) by mouth at bedtime. 07/31/14   Antonieta Iba, MD  erythromycin ophthalmic ointment Place into the right eye 4 (four) times daily. For 7 days Patient not taking: Reported on 07/01/2017 12/24/14   Shaune Pollack, MD  furosemide (LASIX) 20 MG tablet Take 1 tablet (20 mg  total) by mouth daily. Patient not taking: Reported on 07/01/2017 07/31/14   Antonieta Iba, MD  gabapentin (NEURONTIN) 400 MG capsule Take 400 mg by mouth 4 (four) times daily.     [provider]  meloxicam (MOBIC) 15 MG tablet Take 15 mg by mouth daily.    [provider]  metFORMIN (GLUCOPHAGE) 500 MG tablet Take 500 mg by mouth 2 (two) times daily with a meal.    [provider]  metoprolol tartrate (LOPRESSOR) 25 MG tablet Take 37.5 mg by mouth 2 (two) times daily.     [provider]  omeprazole (PRILOSEC) 20 MG capsule Take 1 capsule (20 mg total) by mouth daily. Patient not taking: Reported on 07/01/2017 07/31/14   Antonieta Iba, MD  Oxycodone HCl 10 MG TABS Take 10 mg by mouth every 6 (six) hours as needed (for pain).    [provider]  potassium chloride (K-DUR) 10 MEQ tablet Take 10 mEq by mouth daily.    [provider]  sertraline (ZOLOFT) 50 MG tablet Take 1 tablet (50 mg total) by mouth daily. 07/31/14   Antonieta Iba, MD  simvastatin (ZOCOR) 20 MG tablet Take 20 mg by mouth at bedtime.     [provider]  tamsulosin (FLOMAX) 0.4 MG CAPS capsule Take 1 capsule (0.4 mg total) by mouth daily. 07/31/14   Antonieta Iba, MD  tiZANidine (ZANAFLEX) 4 MG tablet Take 4 mg by mouth 2 (two) times daily as needed for muscle spasms.    [provider]  traZODone (DESYREL) 150 MG tablet Take 150 mg by mouth at bedtime.    [provider]    Family History Family History  Problem Relation Age of Onset  . Alcohol abuse Mother   . Heart disease Mother   . Diabetes Mother   . Heart disease Father   . Diabetes Father   . Cancer Mother   . Cancer Father   . Alcohol abuse Father   . Alcohol abuse Brother     Social History Social History   Tobacco Use  . Smoking status: Never Smoker  . Smokeless tobacco: Never Used  Substance Use Topics  . Alcohol use: Yes    Alcohol/week: 0.0 oz  . Drug  use: Yes    Types: Marijuana    Comment: once a month     Allergies   Patient has no known allergies.   Review of Systems Review of Systems  Neurological: Positive for weakness.  Psychiatric/Behavioral: Positive for confusion.  All other systems reviewed and are negative.    Physical Exam Updated Vital Signs BP 100/63   Pulse 77   Resp 11   SpO2 94%   Physical Exam  Constitutional: He appears well-developed and well-nourished.  HENT:  Head: Normocephalic and atraumatic.  Eyes: Conjunctivae and EOM are normal.  Neck: Normal range of motion.  Cardiovascular: Normal rate.  Pulmonary/Chest: Effort normal. No respiratory distress.  Abdominal: Soft. He exhibits no distension.  Musculoskeletal: Normal range of motion. He exhibits  no edema or deformity.  Neurological: He is alert. No cranial nerve deficit. Coordination normal.  Skin: Skin is warm and dry.  Nursing note and vitals reviewed.    ED Treatments / Results  Labs (all labs ordered are listed, but only abnormal results are displayed) Labs Reviewed  COMPREHENSIVE METABOLIC PANEL - Abnormal; Notable for the following components:      Result Value   Glucose, Bld 199 (*)    BUN 23 (*)    Creatinine, Ser 1.69 (*)    Albumin 3.4 (*)    ALT 16 (*)    GFR calc non Af Amer 38 (*)    GFR calc Af Amer 44 (*)    All other components within normal limits  CBC WITH DIFFERENTIAL/PLATELET - Abnormal; Notable for the following components:   RBC 3.76 (*)    Hemoglobin 10.9 (*)    HCT 35.2 (*)    RDW 15.7 (*)    All other components within normal limits  CBG MONITORING, ED - Abnormal; Notable for the following components:   Glucose-Capillary 180 (*)    All other components within normal limits  I-STAT VENOUS BLOOD GAS, ED - Abnormal; Notable for the following components:   pO2, Ven 29.0 (*)    All other components within normal limits  I-STAT CHEM 8, ED - Abnormal; Notable for the following components:   BUN 23 (*)      Creatinine, Ser 1.50 (*)    Glucose, Bld 187 (*)    Hemoglobin 11.2 (*)    HCT 33.0 (*)    All other components within normal limits  CULTURE, BLOOD (ROUTINE X 2)  CULTURE, BLOOD (ROUTINE X 2)  AMMONIA  URINALYSIS, ROUTINE W REFLEX MICROSCOPIC  I-STAT CG4 LACTIC ACID, ED  I-STAT TROPONIN, ED  I-STAT CG4 LACTIC ACID, ED    EKG EKG Interpretation  Date/Time:  Wednesday July 06 2017 22:11:03 EDT Ventricular Rate:  72 PR Interval:    QRS Duration: 142 QT Interval:  473 QTC Calculation: 518 R Axis:   -63 Text Interpretation:  Sinus rhythm Right bundle branch block Inferior infarct, old No significant change since last tracing Confirmed by Marily MemosMesner, Ariannah Arenson [id number]) on 07/06/2017 11:09:13 PM   Radiology Dg Chest Port 1 View  Result Date: 07/06/2017 CLINICAL DATA:  History of CHF, hypertension, evaluate for infiltrate EXAM: PORTABLE CHEST 1 VIEW COMPARISON:  08/31/2016 FINDINGS: Post sternotomy changes. Slightly low lung volumes. No acute airspace disease or effusion. Aortic atherosclerosis. No pneumothorax. IMPRESSION: No active disease.  Hypoventilatory changes. Electronically Signed   By: Jasmine PangKim  Fujinaga M.D.   On: 07/06/2017 21:53    Procedures Procedures (including critical care time)  CRITICAL CARE Performed by: Marily MemosMesner, Tito Ausmus Total critical care time: 35 minutes Critical care time was exclusive of separately billable procedures and treating other patients. Critical care was necessary to treat or prevent imminent or life-threatening deterioration. Critical care was time spent personally by me on the following activities: development of treatment plan with patient and/or surrogate as well as nursing, discussions with consultants, evaluation of patient's response to treatment, examination of patient, obtaining history from patient or surrogate, ordering and performing treatments and interventions, ordering and review of laboratory studies, ordering and review of radiographic studies,  pulse oximetry and re-evaluation of patient's condition.   Medications Ordered in ED Medications  lactated ringers bolus 1,000 mL (0 mLs Intravenous Stopped 07/06/17 2225)    And  lactated ringers bolus 1,000 mL (1,000 mLs Intravenous New Bag/Given 07/06/17 2330)  And  lactated ringers bolus 1,000 mL (has no administration in time range)    And  lactated ringers bolus 1,000 mL (has no administration in time range)  cefTRIAXone (ROCEPHIN) 2 g in sodium chloride 0.9 % 100 mL IVPB (0 g Intravenous Stopped 07/06/17 2245)     Initial Impression / Assessment and Plan / ED Course  I have reviewed the triage vital signs and the nursing notes.  Pertinent labs & imaging results that were available during my care of the patient were reviewed by me and considered in my medical decision making (see chart for details).    Patient is here for altered mental status apparently somebody called EMS because he was acting himself.  On their arrival he was hypotensive and disoriented so brought here for further evaluation.  On review of the records it appears that the patient was seen here a few days ago and ended up having urinary tract infection but did not receive treatment.  On initial exam here he was hypotensive still with 60s over 30s but after fluid resuscitation this improved significantly.  Code sepsis was called antibiotics were started.  Pending urinalysis. No headache to worry about stroke.    Final Clinical Impressions(s) / ED Diagnoses   Final diagnoses:  Altered mental status, unspecified altered mental status type  Dehydration  Hypotension, unspecified hypotension type    ED Discharge Orders    None       [place] Korver, Barbara Cower, MD 07/07/17 1710

## 2017-07-07 ENCOUNTER — Other Ambulatory Visit: Payer: Self-pay

## 2017-07-07 ENCOUNTER — Observation Stay (HOSPITAL_COMMUNITY): Payer: Medicare Other

## 2017-07-07 DIAGNOSIS — G9340 Encephalopathy, unspecified: Secondary | ICD-10-CM | POA: Diagnosis present

## 2017-07-07 DIAGNOSIS — E860 Dehydration: Secondary | ICD-10-CM | POA: Diagnosis not present

## 2017-07-07 DIAGNOSIS — I959 Hypotension, unspecified: Secondary | ICD-10-CM

## 2017-07-07 DIAGNOSIS — E118 Type 2 diabetes mellitus with unspecified complications: Secondary | ICD-10-CM

## 2017-07-07 DIAGNOSIS — D649 Anemia, unspecified: Secondary | ICD-10-CM | POA: Diagnosis present

## 2017-07-07 DIAGNOSIS — N179 Acute kidney failure, unspecified: Secondary | ICD-10-CM | POA: Diagnosis not present

## 2017-07-07 DIAGNOSIS — N390 Urinary tract infection, site not specified: Secondary | ICD-10-CM | POA: Diagnosis not present

## 2017-07-07 DIAGNOSIS — F0390 Unspecified dementia without behavioral disturbance: Secondary | ICD-10-CM | POA: Diagnosis present

## 2017-07-07 LAB — SALICYLATE LEVEL: Salicylate Lvl: <7 mg/dL (ref 2.8–30.0)

## 2017-07-07 LAB — CBC
HEMATOCRIT: 32.4 % — AB (ref 39.0–52.0)
Hemoglobin: 10.3 g/dL — ABNORMAL LOW (ref 13.0–17.0)
MCH: 29.6 pg (ref 26.0–34.0)
MCHC: 31.8 g/dL (ref 30.0–36.0)
MCV: 93.1 fL (ref 78.0–100.0)
Platelets: 218 10*3/uL (ref 150–400)
RBC: 3.48 MIL/uL — ABNORMAL LOW (ref 4.22–5.81)
RDW: 15.6 % — AB (ref 11.5–15.5)
WBC: 8.2 10*3/uL (ref 4.0–10.5)

## 2017-07-07 LAB — BASIC METABOLIC PANEL
Anion gap: 10 (ref 5–15)
BUN: 25 mg/dL — AB (ref 6–20)
CALCIUM: 8.6 mg/dL — AB (ref 8.9–10.3)
CO2: 23 mmol/L (ref 22–32)
CREATININE: 1.64 mg/dL — AB (ref 0.61–1.24)
Chloride: 106 mmol/L (ref 101–111)
GFR calc Af Amer: 46 mL/min — ABNORMAL LOW (ref >60)
GFR calc non Af Amer: 39 mL/min — ABNORMAL LOW (ref >60)
GLUCOSE: 173 mg/dL — AB (ref 65–99)
Potassium: 4.4 mmol/L (ref 3.5–5.1)
Sodium: 139 mmol/L (ref 135–145)

## 2017-07-07 LAB — RAPID URINE DRUG SCREEN, HOSP PERFORMED
AMPHETAMINES: NOT DETECTED
BENZODIAZEPINES: NOT DETECTED
Barbiturates: NOT DETECTED
COCAINE: NOT DETECTED
Opiates: NOT DETECTED
Tetrahydrocannabinol: POSITIVE — AB

## 2017-07-07 LAB — TROPONIN I: Troponin I: <0.03 ng/mL (ref <0.03)

## 2017-07-07 LAB — TSH: TSH: 0.845 u[IU]/mL (ref 0.350–4.500)

## 2017-07-07 LAB — ACETAMINOPHEN LEVEL: Acetaminophen (Tylenol), Serum: <10 ug/mL — ABNORMAL LOW (ref 10–30)

## 2017-07-07 LAB — ETHANOL

## 2017-07-07 LAB — GLUCOSE, CAPILLARY
GLUCOSE-CAPILLARY: 216 mg/dL — AB (ref 65–99)
Glucose-Capillary: 149 mg/dL — ABNORMAL HIGH (ref 65–99)

## 2017-07-07 LAB — PROTEIN, URINE, RANDOM: Total Protein, Urine: 64 mg/dL

## 2017-07-07 LAB — MAGNESIUM: Magnesium: 1.8 mg/dL (ref 1.7–2.4)

## 2017-07-07 LAB — SODIUM, URINE, RANDOM: SODIUM UR: 97 mmol/L

## 2017-07-07 MED ORDER — ONDANSETRON HCL 4 MG PO TABS: 4.0000 mg | ORAL_TABLET | Freq: Four times a day (QID) | ORAL | Status: DC | PRN

## 2017-07-07 MED ORDER — OXYCODONE HCL 5 MG PO TABS
5.0000 mg | ORAL_TABLET | ORAL | Status: DC | PRN
Start: 2017-07-07 — End: 2017-07-09
  Administered 2017-07-07 – 2017-07-09 (×3): 5 mg via ORAL
  Filled 2017-07-07 (×3): qty 1

## 2017-07-07 MED ORDER — ONDANSETRON HCL 4 MG/2ML IJ SOLN: 4.0000 mg | Freq: Four times a day (QID) | INTRAMUSCULAR | Status: DC | PRN

## 2017-07-07 MED ORDER — ENOXAPARIN SODIUM 40 MG/0.4ML ~~LOC~~ SOLN
40.0000 mg | Freq: Every day | SUBCUTANEOUS | Status: DC
  Administered 2017-07-07 – 2017-07-09 (×3): 40 mg via SUBCUTANEOUS
  Filled 2017-07-07 (×4): qty 0.4

## 2017-07-07 MED ORDER — SODIUM CHLORIDE 0.9 % IV SOLN
INTRAVENOUS | Status: DC
  Administered 2017-07-07: 75 mL/h via INTRAVENOUS
  Administered 2017-07-07: 06:00:00 via INTRAVENOUS

## 2017-07-07 MED ORDER — DONEPEZIL HCL 10 MG PO TABS
10.0000 mg | ORAL_TABLET | Freq: Every day | ORAL | Status: DC
  Administered 2017-07-07 – 2017-07-08 (×2): 10 mg via ORAL
  Filled 2017-07-07 (×2): qty 1

## 2017-07-07 MED ORDER — TAMSULOSIN HCL 0.4 MG PO CAPS
0.4000 mg | ORAL_CAPSULE | Freq: Every day | ORAL | Status: DC
  Administered 2017-07-07 – 2017-07-09 (×3): 0.4 mg via ORAL
  Filled 2017-07-07 (×3): qty 1

## 2017-07-07 MED ORDER — SIMVASTATIN 20 MG PO TABS
20.0000 mg | ORAL_TABLET | Freq: Every day | ORAL | Status: DC
  Administered 2017-07-07 – 2017-07-08 (×2): 20 mg via ORAL
  Filled 2017-07-07 (×2): qty 1

## 2017-07-07 MED ORDER — ACETAMINOPHEN 325 MG PO TABS: 650.0000 mg | ORAL_TABLET | Freq: Four times a day (QID) | ORAL | Status: DC | PRN

## 2017-07-07 MED ORDER — ACETAMINOPHEN 650 MG RE SUPP: 650.0000 mg | Freq: Four times a day (QID) | RECTAL | Status: DC | PRN

## 2017-07-07 MED ORDER — IPRATROPIUM-ALBUTEROL 0.5-2.5 (3) MG/3ML IN SOLN: 3.0000 mL | Freq: Four times a day (QID) | RESPIRATORY_TRACT | Status: DC | PRN

## 2017-07-07 NOTE — Plan of Care (Signed)
  Problem: Education: Goal: Knowledge of General Education information will improve Outcome: Progressing   Problem: Clinical Measurements: Goal: Ability to maintain clinical measurements within normal limits will improve Outcome: Progressing   

## 2017-07-07 NOTE — ED Notes (Signed)
Resting quietly on stretcher watching TV - no complaints of pain or discomfort at this time

## 2017-07-07 NOTE — H&P (Signed)
History and Physical    Peter Weiss ZOX:[id number] DOB: 19-Mar-1940 DOA: 07/06/2017  Referring MD/NP/PA: Dr. Marily Memos PCP: Fleet Contras, MD  Patient coming from: Home via EMS  Chief Complaint: Altered mental status  I have personally briefly reviewed patient's old medical records in [facility]   HPI: Peter Weiss is a 77 y.o. male with medical history significant of HTN, HLD, HFpEF, dementia; who presented after being found to be acutely altered at home.  History is obtained from review of records and patient who is currently a poor historian.  He had been to the emergency department on 4/19, for complaints of dysuria and dizziness.  Patient suspected symptoms secondary to medication changes.  At that time he was evaluated in noted to have a mildly abnormal UA, but did not appear to show clear infection.  They had obtained a urine culture that eventually grew out Proteus, but patient was not able to be contacted thereafter.  Patient only reports generalized malaise and dizziness.  He thinks that he may have passed out.  Denies any current chest pain, shortness of breath, his roommate may have been the one to reportedly called EMS.  Upon their arrival patient was noted to be acutely altered.  Upon EMS arrival patient's blood pressure was noted to be 66/40 patient was given 300 mL of normal IV fluids in route.  ED Course: Upon admission into the emergency department patient temperature was not initially assessed, pulse 66-83, respirations 11-24, blood pressure 93/62-128/74, O2 saturations 90-96% on room air.  Labs revealed WBC 8.4, hemoglobin 10.9, BUN 23, creatinine 1.69, and glucose 199.  Alcohol level was undetectable and UDS positive for marijuana. Patient was given 4 L of normal saline IV fluids and Rocephin were previously seen Proteus on urine culture.  Review of Systems  Unable to perform ROS: Mental status change  Constitutional: Positive for malaise/fatigue.    Respiratory: Negative for shortness of breath.   Cardiovascular: Negative for chest pain and leg swelling.  Genitourinary: Positive for dysuria.  Neurological: Positive for dizziness.    Past Medical History:  Diagnosis Date  . Aortic stenosis   . Arthritis   . CHF (congestive heart failure) (HCC)   . Chicken pox   . Depression   . Diabetes mellitus without complication (HCC)   . Heart murmur   . History of fainting spells of unknown cause   . Hyperlipidemia   . Hypertension   . Neuromuscular disorder (HCC)   . Urinary incontinence     Past Surgical History:  Procedure Laterality Date  . AORTIC VALVE REPLACEMENT N/A 04/26/2014   Procedure: AORTIC VALVE REPLACEMENT (AVR);  Surgeon: Loreli Slot, MD;  Location: [facility] OR;  Service: Open Heart Surgery;  Laterality: N/A;  . CARDIAC CATHETERIZATION  03/2014  . ESOPHAGOGASTRODUODENOSCOPY N/A 07/04/2014   Procedure: ESOPHAGOGASTRODUODENOSCOPY (EGD);  Surgeon: Jeani Hawking, MD;  Location: [facility] ENDOSCOPY;  Service: Endoscopy;  Laterality: N/A;  . goiter  2008  . MAZE N/A 04/26/2014   Procedure: MAZE;  Surgeon: Loreli Slot, MD;  Location[facility] OR;  Service: Open Heart Surgery;  Laterality: N/A;  . MULTIPLE EXTRACTIONS WITH ALVEOLOPLASTY N/A 04/24/2014   Procedure: Extraction of tooth #'s 2,3,5,12,13,14,15,20 with allveoloplasty and gross debridement of remaining dentition and sectioning of bridge at mesial of abutment tooth #32.;  Surgeon: Charlynne Pander, DDS;  Location: [facility] OR;  Service: Oral Surgery;  Laterality: N/A;  . TEE WITHOUT CARDIOVERSION N/A 04/26/2014   Procedure: TRANSESOPHAGEAL ECHOCARDIOGRAM (TEE);  Surgeon: Loreli SlotSteven C Hendrickson, MD;  [facility];  Service: Open Heart Surgery;  Laterality: N/A;     reports that he has never smoked. He has never used smokeless tobacco. He reports that he drinks alcohol. He reports that he has current or past drug history. Drug: Marijuana.  No Known Allergies  Family History   Problem Relation Age of Onset  . Alcohol abuse Mother   . Heart disease Mother   . Diabetes Mother   . Heart disease Father   . Diabetes Father   . Cancer Mother   . Cancer Father   . Alcohol abuse Father   . Alcohol abuse Brother     Prior to Admission medications   Medication Sig Start Date End Date Taking? Authorizing Provider  donepezil (ARICEPT) 10 MG tablet Take 1 tablet (10 mg total) by mouth at bedtime. 07/31/14   Antonieta IbaGollan, Timothy J, MD  gabapentin (NEURONTIN) 400 MG capsule Take 400 mg by mouth 4 (four) times daily.     [provider]  meloxicam (MOBIC) 15 MG tablet Take 15 mg by mouth daily.    [provider]  metFORMIN (GLUCOPHAGE) 500 MG tablet Take 500 mg by mouth 2 (two) times daily with a meal.    [provider]  metoprolol tartrate (LOPRESSOR) 25 MG tablet Take 37.5 mg by mouth 2 (two) times daily.     [provider]  Oxycodone HCl 10 MG TABS Take 10 mg by mouth every 6 (six) hours as needed (for pain).    [provider]  potassium chloride (K-DUR) 10 MEQ tablet Take 10 mEq by mouth daily.    [provider]  sertraline (ZOLOFT) 50 MG tablet Take 1 tablet (50 mg total) by mouth daily. 07/31/14   Antonieta IbaGollan, Timothy J, MD  simvastatin (ZOCOR) 20 MG tablet Take 20 mg by mouth at bedtime.     [provider]  tamsulosin (FLOMAX) 0.4 MG CAPS capsule Take 1 capsule (0.4 mg total) by mouth daily. 07/31/14   Antonieta IbaGollan, Timothy J, MD  tiZANidine (ZANAFLEX) 4 MG tablet Take 4 mg by mouth 2 (two) times daily as needed for muscle spasms.    [provider]  traZODone (DESYREL) 150 MG tablet Take 150 mg by mouth at bedtime.    [provider]    Physical Exam:  Constitutional: Obese male who appears to be lethargic but easily arousable. Vitals:   07/06/17 2200 07/06/17 2207 07/06/17 2230 07/06/17 2300  BP: 95/64 93/62 107/67 100/63  Pulse: 78 78 78 77  Resp:  (!) 24 11   SpO2: 92% 93% 95% 94%    Eyes: PERRL, lids and conjunctivae normal ENMT: Mucous membranes are dry. Posterior pharynx clear of any exudate or lesions.Normal dentition.  Neck: normal, supple, no masses, no thyromegaly Respiratory: clear to auscultation bilaterally, no wheezing, no crackles. Normal respiratory effort. No accessory muscle use.  Cardiovascular: Regular rate and rhythm, no murmurs / rubs / gallops. No extremity edema. 2+ pedal pulses. No carotid bruits.  Abdomen: no tenderness, no masses palpated. No hepatosplenomegaly. Bowel sounds positive.  Musculoskeletal: no clubbing / cyanosis. No joint deformity upper and lower extremities. Good ROM, no contractures. Normal muscle tone.  Skin: no rashes, lesions, ulcers. No induration Neurologic: CN 2-12 grossly intact. Sensation intact, DTR normal. Strength 5/5 in all 4.  Psychiatric: Alert and oriented to person and place at this time.  Recent memory is poor.    Labs on Admission: I have personally reviewed following  labs and imaging studies  CBC: Recent Labs  Lab 07/01/17 1827 07/06/17 2105 07/06/17 2141  WBC 6.8 8.4  --   NEUTROABS 4.8 6.4  --   HGB 10.2* 10.9* 11.2*  HCT 32.9* 35.2* 33.0*  MCV 93.7 93.6  --   PLT 195 257  --    Basic Metabolic Panel: Recent Labs  Lab 07/01/17 1827 07/06/17 2105 07/06/17 2141  NA 142 140 140  K 4.0 5.0 4.4  CL 108 104 106  CO2 26 28  --   GLUCOSE 175* 199* 187*  BUN 28* 23* 23*  CREATININE 1.34* 1.69* 1.50*  CALCIUM 8.9 9.0  --    GFR: CrCl cannot be calculated (Unknown ideal weight.). Liver Function Tests: Recent Labs  Lab 07/01/17 1827 07/06/17 2105  AST 24 23  ALT 18 16*  ALKPHOS 57 57  BILITOT 0.7 0.9  PROT 6.9 6.5  ALBUMIN 3.5 3.4*   No results for input(s): LIPASE, AMYLASE in the last 168 hours. Recent Labs  Lab 07/06/17 2105  AMMONIA 30   Coagulation Profile: No results for input(s): INR, PROTIME in the last 168 hours. Cardiac Enzymes: Recent Labs  Lab 07/01/17 1827   CKTOTAL 77   BNP (last 3 results) No results for input(s): PROBNP in the last 8760 hours. HbA1C: No results for input(s): HGBA1C in the last 72 hours. CBG: Recent Labs  Lab 07/01/17 1835 07/06/17 2033  GLUCAP 156* 180*   Lipid Profile: No results for input(s): CHOL, HDL, LDLCALC, TRIG, CHOLHDL, LDLDIRECT in the last 72 hours. Thyroid Function Tests: No results for input(s): TSH, T4TOTAL, FREET4, T3FREE, THYROIDAB in the last 72 hours. Anemia Panel: No results for input(s): VITAMINB12, FOLATE, FERRITIN, TIBC, IRON, RETICCTPCT in the last 72 hours. Urine analysis:    Component Value Date/Time   COLORURINE AMBER (A) 07/06/2017 2325   APPEARANCEUR HAZY (A) 07/06/2017 2325   APPEARANCEUR Clear 03/19/2014 1200   LABSPEC 1.024 07/06/2017 2325   LABSPEC 1.009 03/19/2014 1200   PHURINE 6.0 07/06/2017 2325   GLUCOSEU 50 (A) 07/06/2017 2325   GLUCOSEU Negative 03/19/2014 1200   HGBUR NEGATIVE 07/06/2017 2325   BILIRUBINUR NEGATIVE 07/06/2017 2325   BILIRUBINUR Negative 03/19/2014 1200   KETONESUR NEGATIVE 07/06/2017 2325   PROTEINUR 100 (A) 07/06/2017 2325   UROBILINOGEN 0.2 07/04/2014 1003   NITRITE NEGATIVE 07/06/2017 2325   LEUKOCYTESUR NEGATIVE 07/06/2017 2325   LEUKOCYTESUR Negative 03/19/2014 1200   Sepsis Labs: Recent Results (from the past 240 hour(s))  Urine culture     Status: Abnormal   Collection Time: 07/01/17  6:27 PM  Result Value Ref Range Status   Specimen Description   Final    URINE, CLEAN CATCH Performed at [facility], [address], [place], [place] [address]    Special Requests   Final    NONE Performed at [facility], [address] [place], [place] [address]    Culture 80,000 COLONIES/mL PROTEUS MIRABILIS (A)  Final   Report Status 07/03/2017 FINAL  Final   Organism ID, Bacteria PROTEUS MIRABILIS (A)  Final      Susceptibility   Proteus mirabilis - MIC*    AMPICILLIN <=2 SENSITIVE Sensitive      CEFAZOLIN <=4 SENSITIVE Sensitive     CEFTRIAXONE <=1 SENSITIVE Sensitive     CIPROFLOXACIN <=0.25 SENSITIVE Sensitive     GENTAMICIN <=1 SENSITIVE Sensitive     IMIPENEM 2 SENSITIVE Sensitive     NITROFURANTOIN 128 RESISTANT Resistant  TRIMETH/SULFA <=20 SENSITIVE Sensitive     AMPICILLIN/SULBACTAM <=2 SENSITIVE Sensitive     PIP/TAZO <=4 SENSITIVE Sensitive     * 80,000 COLONIES/mL PROTEUS MIRABILIS     Radiological Exams on Admission: Dg Chest Port 1 View  Result Date: 07/06/2017 CLINICAL DATA:  History of CHF, hypertension, evaluate for infiltrate EXAM: PORTABLE CHEST 1 VIEW COMPARISON:  08/31/2016 FINDINGS: Post sternotomy changes. Slightly low lung volumes. No acute airspace disease or effusion. Aortic atherosclerosis. No pneumothorax. IMPRESSION: No active disease.  Hypoventilatory changes. Electronically Signed   By: Jasmine Pang M.D.   On: 07/06/2017 21:53    EKG: Independently reviewed.  Sinus rhythm with RBBB at 72 bpm and prolonged QTC of 518  Assessment/Plan Acute encephalopathy: Patient presents acutely altered, but baseline not clearly known.  Question possibility of underlying infection with UTI vs dehydration vs medication/substance. - Admit to a telemetry bed - Neurochecks - Hold sedating medication - Reassess mental status in a.m.  - may want to investigate recent medication changes  Hypotension: Acute.  Patient's initial blood pressures were noted to be as low as 60/40.  Blood pressures improved after initial fluid boluses. - Hold blood pressure medications - Trend troponins  - May warrant further cardiac workup  Prolonged QTC interval - Check magnesium - Recheck EKG in a.m  Possible urinary tract infection with Proteus: Patient was noted to have abnormal urinalysis during last emergency department visit.  Cultures grew back Proteus.  Patient was started on ceftriaxone in the ED. - Follow-up recheck of urine culture - Continue Rocephin for now  Acute  kidney injury: Patient presents with BUN 23 and creatinine 1.69.  Review of records shows that baseline kidney function previously had been within normal limits. - Strict intake and output - Check renal ultrasound  - Recheck BMP in a.m. - Hold nephrotoxic agent  Diastolic CHF: Patient appears to be hypovolemic at this time.  Last EF noted to be 65 to 70% in 2016. - Strict intake and output and daily weights  History of dementia  - Continue Aricept  Diabetes mellitus type 2: Patient appears to be on metformin .  Initial blood glucose elevated 199 on admission. - Hypoglycemic protocol  - Hold metformin  Normocytic anemia: Hemoglobin 10.9 which appears similar to blood counts 5 days ago.  - Recheck CBC in a.m  DVT prophylaxis: Lovenox Code Status: Full Family Communication: none present Disposition Plan: TBD  Consults called: None  Admission status: Observation  Clydie Braun MD [facility] Pager [phone]   If 7PM-7AM, please contact night-coverage [email or web address] Password [facility]  07/07/2017, 12:59 AM

## 2017-07-07 NOTE — Progress Notes (Signed)
Patient is a 77 year old male with past medical history of hypertension, hyperlipidemia, diastolic CHF, dementia who was brought to the emergency department because of altered mental status.  Patient altered mental status was suspected secondary to his medication changes.  Patient was also found to be hypotensive on presentation and responded to the IV fluids.  Patient found to have acute kidney injury on presentation.  Start on gentle IV fluids.  Urinalysis was positive for UTI, so urine culture sent and started on IV antibiotics. Patient seen and examined the bedside this morning.  During my evaluation, he looked comfortable.  He was alert and oriented.  He denies any complaints to me.  He is hemodynamically stable.  I could not suspect he has resolved.  Blood pressure stable.  Electrolytes are normal.  He also underwent ultrasound of the kidneys which were normal. We will continue to monitor the patient.

## 2017-07-07 NOTE — ED Notes (Signed)
Heart Healthy/Carb Modified diet lunch tray ordered.  

## 2017-07-07 NOTE — ED Notes (Signed)
Report called to [place], RN on 3[facility] - pt transported to [facility] on Tele via EDT; pt awake, alert, oriented to person, place and president - not oriented to day of week

## 2017-07-08 DIAGNOSIS — E785 Hyperlipidemia, unspecified: Secondary | ICD-10-CM | POA: Diagnosis present

## 2017-07-08 DIAGNOSIS — E1165 Type 2 diabetes mellitus with hyperglycemia: Secondary | ICD-10-CM | POA: Diagnosis present

## 2017-07-08 DIAGNOSIS — N179 Acute kidney failure, unspecified: Secondary | ICD-10-CM | POA: Diagnosis not present

## 2017-07-08 DIAGNOSIS — G4700 Insomnia, unspecified: Secondary | ICD-10-CM

## 2017-07-08 DIAGNOSIS — G9340 Encephalopathy, unspecified: Secondary | ICD-10-CM | POA: Diagnosis not present

## 2017-07-08 DIAGNOSIS — Z5320 Procedure and treatment not carried out because of patient's decision for unspecified reasons: Secondary | ICD-10-CM | POA: Diagnosis not present

## 2017-07-08 DIAGNOSIS — M549 Dorsalgia, unspecified: Secondary | ICD-10-CM | POA: Diagnosis present

## 2017-07-08 DIAGNOSIS — Z811 Family history of alcohol abuse and dependence: Secondary | ICD-10-CM

## 2017-07-08 DIAGNOSIS — I110 Hypertensive heart disease with heart failure: Secondary | ICD-10-CM | POA: Diagnosis not present

## 2017-07-08 DIAGNOSIS — F1290 Cannabis use, unspecified, uncomplicated: Secondary | ICD-10-CM | POA: Diagnosis not present

## 2017-07-08 DIAGNOSIS — R450 Nervousness: Secondary | ICD-10-CM | POA: Diagnosis not present

## 2017-07-08 DIAGNOSIS — Z952 Presence of prosthetic heart valve: Secondary | ICD-10-CM | POA: Diagnosis not present

## 2017-07-08 DIAGNOSIS — F329 Major depressive disorder, single episode, unspecified: Secondary | ICD-10-CM

## 2017-07-08 DIAGNOSIS — F4321 Adjustment disorder with depressed mood: Secondary | ICD-10-CM | POA: Diagnosis present

## 2017-07-08 DIAGNOSIS — E860 Dehydration: Secondary | ICD-10-CM | POA: Diagnosis present

## 2017-07-08 DIAGNOSIS — I4581 Long QT syndrome: Secondary | ICD-10-CM | POA: Diagnosis present

## 2017-07-08 DIAGNOSIS — R451 Restlessness and agitation: Secondary | ICD-10-CM | POA: Diagnosis not present

## 2017-07-08 DIAGNOSIS — Z008 Encounter for other general examination: Secondary | ICD-10-CM

## 2017-07-08 DIAGNOSIS — F0390 Unspecified dementia without behavioral disturbance: Secondary | ICD-10-CM | POA: Diagnosis not present

## 2017-07-08 DIAGNOSIS — I5032 Chronic diastolic (congestive) heart failure: Secondary | ICD-10-CM | POA: Diagnosis not present

## 2017-07-08 DIAGNOSIS — F4323 Adjustment disorder with mixed anxiety and depressed mood: Secondary | ICD-10-CM

## 2017-07-08 DIAGNOSIS — Z79899 Other long term (current) drug therapy: Secondary | ICD-10-CM | POA: Diagnosis not present

## 2017-07-08 DIAGNOSIS — I959 Hypotension, unspecified: Secondary | ICD-10-CM | POA: Diagnosis present

## 2017-07-08 DIAGNOSIS — D638 Anemia in other chronic diseases classified elsewhere: Secondary | ICD-10-CM | POA: Diagnosis present

## 2017-07-08 DIAGNOSIS — N390 Urinary tract infection, site not specified: Secondary | ICD-10-CM | POA: Diagnosis not present

## 2017-07-08 DIAGNOSIS — F419 Anxiety disorder, unspecified: Secondary | ICD-10-CM | POA: Diagnosis not present

## 2017-07-08 DIAGNOSIS — Z7984 Long term (current) use of oral hypoglycemic drugs: Secondary | ICD-10-CM | POA: Diagnosis not present

## 2017-07-08 DIAGNOSIS — K219 Gastro-esophageal reflux disease without esophagitis: Secondary | ICD-10-CM | POA: Diagnosis present

## 2017-07-08 DIAGNOSIS — G8929 Other chronic pain: Secondary | ICD-10-CM | POA: Diagnosis present

## 2017-07-08 LAB — BASIC METABOLIC PANEL WITH GFR
Anion gap: 10 (ref 5–15)
BUN: 27 mg/dL — ABNORMAL HIGH (ref 6–20)
CO2: 25 mmol/L (ref 22–32)
Calcium: 8.8 mg/dL — ABNORMAL LOW (ref 8.9–10.3)
Chloride: 104 mmol/L (ref 101–111)
Creatinine, Ser: 1.31 mg/dL — ABNORMAL HIGH (ref 0.61–1.24)
GFR calc Af Amer: 60 mL/min — ABNORMAL LOW
GFR calc non Af Amer: 52 mL/min — ABNORMAL LOW
Glucose, Bld: 152 mg/dL — ABNORMAL HIGH (ref 65–99)
Potassium: 4.1 mmol/L (ref 3.5–5.1)
Sodium: 139 mmol/L (ref 135–145)

## 2017-07-08 LAB — URINE CULTURE: Culture: NO GROWTH

## 2017-07-08 LAB — GLUCOSE, CAPILLARY: Glucose-Capillary: 153 mg/dL — ABNORMAL HIGH (ref 65–99)

## 2017-07-08 MED ORDER — SERTRALINE HCL 50 MG PO TABS
50.0000 mg | ORAL_TABLET | Freq: Every day | ORAL | Status: DC
  Administered 2017-07-09: 50 mg via ORAL
  Filled 2017-07-08: qty 1

## 2017-07-08 NOTE — Progress Notes (Signed)
After talking with the pt, he is alert and oriented to person, time, place and situation. At times pt is confused and forgets where he is and how and why he is at the hospital. He is able to tell me about where he lives, what medications he takes daily and at what time of the day he takes them. Pt was able to state that he has two [place] and [place]. After talking to the pt for about 10 minutes he stated " I don't know what I acted like an ass this morning, someone must have given me something" this nurse asked where did you go last night and the pt stated "I don't know what I did last night and how I got here" Pt waxes and wanes out of orientation and  Confusion.

## 2017-07-08 NOTE — Progress Notes (Signed)
Pt A&OX3.Pt combative and uncooperative. Pt left unit through back exit door. Security was called for assistance. Dr. Renford DillsAdhikari was made aware and came to evaluate pt.

## 2017-07-08 NOTE — Progress Notes (Signed)
A&OX2. Pt put on his jeans and shoes and said he was leaving. When I tried to stop him, he became aggressive and slamming doors. Linton FlemingsX. Blount, NP was made aware.... IVC safety sitter was ordered.

## 2017-07-08 NOTE — Progress Notes (Addendum)
Pt became belligerent, verbally, and physically aggressive. Pt took off his telemetry monitor and pulled out his two PIVs.  Pt refused to let me put him back on telemetry, and he refused to restart his PIV. Peter Weiss was made aware. Tele safety sitter was ordered.

## 2017-07-08 NOTE — Progress Notes (Signed)
Pt is verbally and physically aggressive. He took off his telemetry monitor and refuses to put it back on. Pt pulled out both of his PIVs and refuses to have PIV restarted. Linton FlemingsX. Blount, NP was made aware. Tele sitter was ordered.

## 2017-07-08 NOTE — Consult Note (Signed)
[facility] Consult   Reason for Consult:  Capacity evaluation  Referring Physician:  Dr. Tawanna Solo Patient Identification: Peter Weiss MRN:  [id number] Principal Diagnosis: Evaluation by psychiatric service required Diagnosis:   Patient Active Problem List   Diagnosis Date Noted  . Acute encephalopathy [G93.40] 07/07/2017  . Acute lower UTI [N39.0] 07/07/2017  . Dementia [F03.90] 07/07/2017  . Normocytic anemia [D64.9] 07/07/2017  . Multiple falls [R29.6] 12/23/2014  . DDD (degenerative disc disease), lumbar [M51.36] 09/09/2014  . Facet syndrome, lumbar [M47.816] 09/09/2014  . Spinal stenosis, lumbar region, with neurogenic claudication [M48.062] 09/09/2014  . Neuropathy due to secondary diabetes ([place]) [E13.40] 09/09/2014  . Upper GI bleed [K92.2]   . Acute blood loss anemia [D62]   . Abdominal aortic aneurysm ([place]) [I71.4]   . Essential hypertension [I10]   . Adjustment disorder with disturbance of conduct [F43.24]   . Chronic pain syndrome [G89.4]   . Urinary frequency [R35.0]   . Hemorrhagic shock ([place]) [R57.8] 07/04/2014  . S/P AVR (aortic valve replacement) [Z95.2] 07/04/2014  . Chest pain [R07.9] 07/04/2014  . Gastrointestinal hemorrhage [K92.2] 07/04/2014  . Melena [K92.1]   . Acute kidney injury ([place]) [N17.9]   . Aortic stenosis [I35.0]   . Atrial fibrillation, unspecified [I48.91]   . Dental caries [K02.9]   . Pyrexia [R50.9]   . Aortic valve stenosis, critical [I35.0] 04/20/2014  . Syncope and collapse [R55] 04/18/2014  . Dehydration [E86.0] 04/18/2014  . Hypotension [I95.9] 04/18/2014  . Lethargic [R53.83] 03/25/2014  . Adjustment disorder with mixed anxiety and depressed mood [F43.23] 03/25/2014  . Chronic back pain [M54.9, G89.29] 09/28/2013  . Mild cognitive impairment [G31.84] 09/28/2013  . DM2 (diabetes mellitus, type 2) ([place]) [E11.9] 09/28/2013  . HLD (hyperlipidemia) [E78.5] 09/28/2013  . GERD (gastroesophageal reflux disease)  [K21.9] 09/28/2013    Total Time spent with patient: 1 hour  Subjective:   Peter Weiss is a 77 y.o. male patient admitted with AMS.  HPI:   Per chart review, patient was admitted with AMS. He was found to be hypotensive to 60/40 with AKI on presentation and responded well to IVF. He has been combative and aggressive and requests discharge. He has been refusing care. He is prescribed Donepezil 10 mg qhs. He is also prescribed Zoloft 50 mg daily but is not receiving it in the hospital.   On interview, Peter Weiss reports feeling "goofy" yesterday after he was given medication.  He is oriented to person, place and time today.  He reports feeling anxious due to not regularly seeing his children or grandchildren.  He also reports some sadness.  He denies SI, HI or AVH.  He is asked about medications for improving his mood and anxiety and he reports, "All I want is Oxycodone."  He reports a history of chronic back pain due to bulging discs.  He is able to correctly report his medical history.   Past Psychiatric History: Dementia and depression.  Risk to Self: Is patient at risk for suicide?: No Risk to Others:  None. Denies HI.  Prior Inpatient Therapy:  Denies  Prior Outpatient Therapy:  Denies although per chart review Zoloft is listed as a home medication.   Past Medical History:  Past Medical History:  Diagnosis Date  . Aortic stenosis   . Arthritis   . CHF (congestive heart failure) ([place])   . Chicken pox   . Depression   . Diabetes mellitus without complication ([place])   . Heart murmur   .  History of fainting spells of unknown cause   . Hyperlipidemia   . Hypertension   . Neuromuscular disorder ([place])   . Urinary incontinence     Past Surgical History:  Procedure Laterality Date  . AORTIC VALVE REPLACEMENT N/A 04/26/2014   Procedure: AORTIC VALVE REPLACEMENT (AVR);  Surgeon: Melrose Nakayama, MD;  Location: [facility];  Service: Open Heart Surgery;  Laterality: N/A;  .  CARDIAC CATHETERIZATION  03/2014  . ESOPHAGOGASTRODUODENOSCOPY N/A 07/04/2014   Procedure: ESOPHAGOGASTRODUODENOSCOPY (EGD);  Surgeon: Carol Ada, MD;  Location: [facility] ENDOSCOPY;  Service: Endoscopy;  Laterality: N/A;  . goiter  2008  . MAZE N/A 04/26/2014   Procedure: MAZE;  Surgeon: Melrose Nakayama, MD;  Location: [facility];  Service: Open Heart Surgery;  Laterality: N/A;  . MULTIPLE EXTRACTIONS WITH ALVEOLOPLASTY N/A 04/24/2014   Procedure: Extraction of tooth #'s 2,3,5,12,13,14,15,20 with allveoloplasty and gross debridement of remaining dentition and sectioning of bridge at mesial of abutment tooth #32.;  Surgeon: Lenn Cal, DDS;  Location: [facility];  Service: Oral Surgery;  Laterality: N/A;  . TEE WITHOUT CARDIOVERSION N/A 04/26/2014   Procedure: TRANSESOPHAGEAL ECHOCARDIOGRAM (TEE);  Surgeon: Melrose Nakayama, MD;  Location: [facility];  Service: Open Heart Surgery;  Laterality: N/A;   Family History:  Family History  Problem Relation Age of Onset  . Alcohol abuse Mother   . Heart disease Mother   . Diabetes Mother   . Heart disease Father   . Diabetes Father   . Cancer Mother   . Cancer Father   . Alcohol abuse Father   . Alcohol abuse Brother    Family Psychiatric  History: Mother, father and brother with alcohol abuse.  Social History:  Social History   Substance and Sexual Activity  Alcohol Use Yes  . Alcohol/week: 0.0 oz     Social History   Substance and Sexual Activity  Drug Use Yes  . Types: Marijuana   Comment: once a month    Social History   Socioeconomic History  . Marital status: Divorced    Spouse name: Not on file  . Number of children: Not on file  . Years of education: Not on file  . Highest education level: Not on file  Occupational History  . Not on file  Social Needs  . Financial resource strain: Not on file  . Food insecurity:    Worry: Not on file    Inability: Not on file  . Transportation needs:    Medical: Not on file     Non-medical: Not on file  Tobacco Use  . Smoking status: Never Smoker  . Smokeless tobacco: Never Used  Substance and Sexual Activity  . Alcohol use: Yes    Alcohol/week: 0.0 oz  . Drug use: Yes    Types: Marijuana    Comment: once a month  . Sexual activity: Not Currently  Lifestyle  . Physical activity:    Days per week: Not on file    Minutes per session: Not on file  . Stress: Not on file  Relationships  . Social connections:    Talks on phone: Not on file    Gets together: Not on file    Attends religious service: Not on file    Active member of club or organization: Not on file    Attends meetings of clubs or organizations: Not on file    Relationship status: Not on file  Other Topics Concern  . Not on file  Social History  Narrative   ** Merged History Encounter **       ** Merged History Encounter **       Additional Social History: He lives with a roommate.  He has 2 dogs.  He previously owned a few businesses.  He has 2 children. They do not live in [place].  He has been married twice and is a widower.  He receives SSI. He denies alcohol or illicit substance use. UDS was positive for THC on admission.     Allergies:  No Known Allergies  Labs:  Results for orders placed or performed during the hospital encounter of 07/06/17 (from the past 48 hour(s))  CBG monitoring, ED     Status: Abnormal   Collection Time: 07/06/17  8:33 PM  Result Value Ref Range   Glucose-Capillary 180 (H) 65 - 99 mg/dL  Comprehensive metabolic panel     Status: Abnormal   Collection Time: 07/06/17  9:05 PM  Result Value Ref Range   Sodium 140 135 - 145 mmol/L   Potassium 5.0 3.5 - 5.1 mmol/L   Chloride 104 101 - 111 mmol/L   CO2 28 22 - 32 mmol/L   Glucose, Bld 199 (H) 65 - 99 mg/dL   BUN 23 (H) 6 - 20 mg/dL   Creatinine, Ser 1.69 (H) 0.61 - 1.24 mg/dL   Calcium 9.0 8.9 - 10.3 mg/dL   Total Protein 6.5 6.5 - 8.1 g/dL   Albumin 3.4 (L) 3.5 - 5.0 g/dL   AST 23 15 - 41 U/L    ALT 16 (L) 17 - 63 U/L   Alkaline Phosphatase 57 38 - 126 U/L   Total Bilirubin 0.9 0.3 - 1.2 mg/dL   GFR calc non Af Amer 38 (L) >60 mL/min   GFR calc Af Amer 44 (L) >60 mL/min    Comment: (NOTE) The eGFR has been calculated using the CKD EPI equation. This calculation has not been validated in all clinical situations. eGFR's persistently <60 mL/min signify possible Chronic Kidney Disease.    Anion gap 8 5 - 15    Comment: Performed at [facility] [address], [place], [place] [address]  CBC WITH DIFFERENTIAL     Status: Abnormal   Collection Time: 07/06/17  9:05 PM  Result Value Ref Range   WBC 8.4 4.0 - 10.5 K/uL   RBC 3.76 (L) 4.22 - 5.81 MIL/uL   Hemoglobin 10.9 (L) 13.0 - 17.0 g/dL   HCT 35.2 (L) 39.0 - 52.0 %   MCV 93.6 78.0 - 100.0 fL   MCH 29.0 26.0 - 34.0 pg   MCHC 31.0 30.0 - 36.0 g/dL   RDW 15.7 (H) 11.5 - 15.5 %   Platelets 257 150 - 400 K/uL   Neutrophils Relative % 77 %   Neutro Abs 6.4 1.7 - 7.7 K/uL   Lymphocytes Relative 14 %   Lymphs Abs 1.2 0.7 - 4.0 K/uL   Monocytes Relative 7 %   Monocytes Absolute 0.6 0.1 - 1.0 K/uL   Eosinophils Relative 2 %   Eosinophils Absolute 0.2 0.0 - 0.7 K/uL   Basophils Relative 0 %   Basophils Absolute 0.0 0.0 - 0.1 K/uL    Comment: Performed at [facility] [address], [place], [place] [address]  Ammonia     Status: None   Collection Time: 07/06/17  9:05 PM  Result Value Ref Range   Ammonia 30 9 - 35 umol/L    Comment: Performed at  [facility], [place] [address] [place], [place] [address]  Blood Culture (routine x 2)     Status: None (Preliminary result)   Collection Time: 07/06/17  9:13 PM  Result Value Ref Range   Specimen Description BLOOD RIGHT HAND    Special Requests      BOTTLES DRAWN AEROBIC AND ANAEROBIC Blood Culture adequate volume   Culture      NO GROWTH 2 DAYS Performed at [facility], [place] [address] [place], [place] [address]    Report Status PENDING   Blood Culture  (routine x 2)     Status: None (Preliminary result)   Collection Time: 07/06/17  9:18 PM  Result Value Ref Range   Specimen Description BLOOD RIGHT HAND    Special Requests      BOTTLES DRAWN AEROBIC AND ANAEROBIC Blood Culture results may not be optimal due to an inadequate volume of blood received in culture bottles   Culture      NO GROWTH 2 DAYS Performed at [facility], [place] [address] [place], [place] [address]    Report Status PENDING   I-Stat CG4 Lactic Acid, ED  (not at  [facility])     Status: None   Collection Time: 07/06/17  9:29 PM  Result Value Ref Range   Lactic Acid, Venous 1.57 0.5 - 1.9 mmol/L  I-stat troponin, ED     Status: None   Collection Time: 07/06/17  9:38 PM  Result Value Ref Range   Troponin i, poc 0.00 0.00 - 0.08 ng/mL   Comment 3            Comment: Due to the release kinetics of cTnI, a negative result within the first hours of the onset of symptoms does not rule out myocardial infarction with certainty. If myocardial infarction is still suspected, repeat the test at appropriate intervals.   I-Stat venous blood gas, ED ([facility], [facility])     Status: Abnormal   Collection Time: 07/06/17  9:39 PM  Result Value Ref Range   pH, Ven 7.362 7.250 - 7.430   pCO2, Ven 44.4 44.0 - 60.0 mmHg   pO2, Ven 29.0 (LL) 32.0 - 45.0 mmHg   Bicarbonate 25.2 20.0 - 28.0 mmol/L   TCO2 27 22 - 32 mmol/L   O2 Saturation 53.0 %   Patient temperature HIDE    Sample type VENOUS    Comment NOTIFIED PHYSICIAN   I-Stat Chem 8, ED     Status: Abnormal   Collection Time: 07/06/17  9:41 PM  Result Value Ref Range   Sodium 140 135 - 145 mmol/L   Potassium 4.4 3.5 - 5.1 mmol/L   Chloride 106 101 - 111 mmol/L   BUN 23 (H) 6 - 20 mg/dL   Creatinine, Ser 1.50 (H) 0.61 - 1.24 mg/dL   Glucose, Bld 187 (H) 65 - 99 mg/dL   Calcium, Ion 1.18 1.15 - 1.40 mmol/L   TCO2 25 22 - 32 mmol/L   Hemoglobin 11.2 (L) 13.0 - 17.0 g/dL   HCT 33.0 (L) 39.0 - 52.0 %  Urinalysis, Routine w reflex  microscopic     Status: Abnormal   Collection Time: 07/06/17 11:25 PM  Result Value Ref Range   Color, Urine AMBER (A) YELLOW    Comment: BIOCHEMICALS MAY BE AFFECTED BY COLOR   APPearance HAZY (A) CLEAR   Specific Gravity, Urine 1.024 1.005 - 1.030   pH 6.0 5.0 - 8.0   Glucose, UA 50 (  A) NEGATIVE mg/dL   Hgb urine dipstick NEGATIVE NEGATIVE   Bilirubin Urine NEGATIVE NEGATIVE   Ketones, ur NEGATIVE NEGATIVE mg/dL   Protein, ur 100 (A) NEGATIVE mg/dL   Nitrite NEGATIVE NEGATIVE   Leukocytes, UA NEGATIVE NEGATIVE   RBC / HPF 0-5 0 - 5 RBC/hpf   WBC, UA 0-5 0 - 5 WBC/hpf   Bacteria, UA RARE (A) NONE SEEN   Squamous Epithelial / LPF 0-5 0 - 5    Comment: Please note change in reference range.   Mucus PRESENT    Hyaline Casts, UA PRESENT     Comment: Performed at [facility], [place] [address] [place], [place] [address]  Urine culture     Status: None   Collection Time: 07/06/17 11:25 PM  Result Value Ref Range   Specimen Description URINE, CLEAN CATCH    Special Requests NONE    Culture      NO GROWTH Performed at [facility], [place] [address] [place], [place] [address]    Report Status 07/08/2017 FINAL   Sodium, urine, random     Status: None   Collection Time: 07/06/17 11:25 PM  Result Value Ref Range   Sodium, Ur 97 mmol/L    Comment: Performed at [facility] [address] [place], [place] [address]  Protein, urine, random     Status: None   Collection Time: 07/06/17 11:25 PM  Result Value Ref Range   Total Protein, Urine 64 mg/dL    Comment: NO NORMAL RANGE ESTABLISHED FOR THIS TEST Performed at [facility], [place] [address] [place], [place] [address]   Rapid urine drug screen (hospital performed)     Status: Abnormal   Collection Time: 07/07/17  1:07 AM  Result Value Ref Range   Opiates NONE DETECTED NONE DETECTED   Cocaine NONE DETECTED NONE DETECTED   Benzodiazepines NONE DETECTED NONE DETECTED   Amphetamines NONE DETECTED NONE  DETECTED   Tetrahydrocannabinol POSITIVE (A) NONE DETECTED   Barbiturates NONE DETECTED NONE DETECTED    Comment: (NOTE) DRUG SCREEN FOR MEDICAL PURPOSES ONLY.  IF CONFIRMATION IS NEEDED FOR ANY PURPOSE, NOTIFY LAB WITHIN 5 DAYS. LOWEST DETECTABLE LIMITS FOR URINE DRUG SCREEN Drug Class                     Cutoff (ng/mL) Amphetamine and metabolites    1000 Barbiturate and metabolites    200 Benzodiazepine                 379 Tricyclics and metabolites     300 Opiates and metabolites        300 Cocaine and metabolites        300 THC                            50 Performed at [facility], [place] [address] [place], [place] [address]   Ethanol     Status: None   Collection Time: 07/07/17  1:40 AM  Result Value Ref Range   Alcohol, Ethyl (B) <10 <10 mg/dL    Comment:        LOWEST DETECTABLE LIMIT FOR SERUM ALCOHOL IS 10 mg/dL FOR MEDICAL PURPOSES ONLY Performed at [facility], [place] [address], [place], [place] [address]   Acetaminophen level     Status: Abnormal   Collection Time: 07/07/17  2:20 AM  Result Value Ref  Range   Acetaminophen (Tylenol), Serum <10 (L) 10 - 30 ug/mL    Comment:        THERAPEUTIC CONCENTRATIONS VARY SIGNIFICANTLY. A RANGE OF 10-30 ug/mL MAY BE AN EFFECTIVE CONCENTRATION FOR MANY PATIENTS. HOWEVER, SOME ARE BEST TREATED AT CONCENTRATIONS OUTSIDE THIS RANGE. ACETAMINOPHEN CONCENTRATIONS >150 ug/mL AT 4 HOURS AFTER INGESTION AND >50 ug/mL AT 12 HOURS AFTER INGESTION ARE OFTEN ASSOCIATED WITH TOXIC REACTIONS. Performed at [facility], [place] [address] [place], [place] [address]   Salicylate level     Status: None   Collection Time: 07/07/17  2:20 AM  Result Value Ref Range   Salicylate Lvl <5.0 2.8 - 30.0 mg/dL    Comment: Performed at [facility] [address], [place], [place] [address]  CBC     Status: Abnormal   Collection Time: 07/07/17  2:36 AM  Result Value Ref Range   WBC 8.2 4.0 - 10.5 K/uL   RBC 3.48  (L) 4.22 - 5.81 MIL/uL   Hemoglobin 10.3 (L) 13.0 - 17.0 g/dL   HCT 32.4 (L) 39.0 - 52.0 %   MCV 93.1 78.0 - 100.0 fL   MCH 29.6 26.0 - 34.0 pg   MCHC 31.8 30.0 - 36.0 g/dL   RDW 15.6 (H) 11.5 - 15.5 %   Platelets 218 150 - 400 K/uL    Comment: Performed at [facility], [place] [address], [place], [place] [address]  Basic metabolic panel     Status: Abnormal   Collection Time: 07/07/17  2:36 AM  Result Value Ref Range   Sodium 139 135 - 145 mmol/L   Potassium 4.4 3.5 - 5.1 mmol/L   Chloride 106 101 - 111 mmol/L   CO2 23 22 - 32 mmol/L   Glucose, Bld 173 (H) 65 - 99 mg/dL   BUN 25 (H) 6 - 20 mg/dL   Creatinine, Ser 1.64 (H) 0.61 - 1.24 mg/dL   Calcium 8.6 (L) 8.9 - 10.3 mg/dL   GFR calc non Af Amer 39 (L) >60 mL/min   GFR calc Af Amer 46 (L) >60 mL/min    Comment: (NOTE) The eGFR has been calculated using the CKD EPI equation. This calculation has not been validated in all clinical situations. eGFR's persistently <60 mL/min signify possible Chronic Kidney Disease.    Anion gap 10 5 - 15    Comment: Performed at [facility] [address], [place], [place] [address]  TSH     Status: None   Collection Time: 07/07/17  2:36 AM  Result Value Ref Range   TSH 0.845 0.350 - 4.500 uIU/mL    Comment: Performed by a 3rd Generation assay with a functional sensitivity of <=0.01 uIU/mL. Performed at [facility], [place] [address] [place], [place] [address]   Magnesium     Status: None   Collection Time: 07/07/17  4:50 AM  Result Value Ref Range   Magnesium 1.8 1.7 - 2.4 mg/dL    Comment: Performed at [facility] [address], [place], [place] [address]  Troponin I (q 6hr x 3)     Status: None   Collection Time: 07/07/17  4:50 AM  Result Value Ref Range   Troponin I <0.03 <0.03 ng/mL    Comment: Performed at [facility] [address] [place], [place] [address]  Troponin I (q 6hr x 3)     Status: None   Collection Time: 07/07/17 10:58 AM  Result  Value Ref Range   Troponin I <0.03 <0.03 ng/mL    Comment: Performed at [facility], [place] [address], [place], [place] [address]  Glucose, capillary     Status: Abnormal   Collection Time: 07/07/17  4:49 PM  Result Value Ref Range   Glucose-Capillary 216 (H) 65 - 99 mg/dL  Troponin I (q 6hr x 3)     Status: None   Collection Time: 07/07/17  5:00 PM  Result Value Ref Range   Troponin I <0.03 <0.03 ng/mL    Comment: Performed at [facility] [address], [place], [place] [address]  Glucose, capillary     Status: Abnormal   Collection Time: 07/07/17  9:53 PM  Result Value Ref Range   Glucose-Capillary 149 (H) 65 - 99 mg/dL   Comment 1 Notify RN    Comment 2 Document in Chart   Basic metabolic panel     Status: Abnormal   Collection Time: 07/08/17  3:29 AM  Result Value Ref Range   Sodium 139 135 - 145 mmol/L   Potassium 4.1 3.5 - 5.1 mmol/L   Chloride 104 101 - 111 mmol/L   CO2 25 22 - 32 mmol/L   Glucose, Bld 152 (H) 65 - 99 mg/dL   BUN 27 (H) 6 - 20 mg/dL   Creatinine, Ser 1.31 (H) 0.61 - 1.24 mg/dL   Calcium 8.8 (L) 8.9 - 10.3 mg/dL   GFR calc non Af Amer 52 (L) >60 mL/min   GFR calc Af Amer 60 (L) >60 mL/min    Comment: (NOTE) The eGFR has been calculated using the CKD EPI equation. This calculation has not been validated in all clinical situations. eGFR's persistently <60 mL/min signify possible Chronic Kidney Disease.    Anion gap 10 5 - 15    Comment: Performed at [facility] [address] [place], [place] [address]    Current Facility-Administered Medications  Medication Dose Route Frequency Provider Last Rate Last Dose  . 0.9 %  sodium chloride infusion   Intravenous Continuous Fuller Plan A, MD 75 mL/hr at 07/07/17 1855    . acetaminophen (TYLENOL) tablet 650 mg  650 mg Oral Q6H PRN Fuller Plan A, MD       Or  . acetaminophen (TYLENOL) suppository 650 mg  650 mg Rectal Q6H PRN Tamala Julian, Rondell A, MD      . donepezil (ARICEPT) tablet  10 mg  10 mg Oral QHS Smith, Rondell A, MD   10 mg at 07/07/17 2220  . enoxaparin (LOVENOX) injection 40 mg  40 mg Subcutaneous Daily Fuller Plan A, MD   40 mg at 07/08/17 0937  . ipratropium-albuterol (DUONEB) 0.5-2.5 (3) MG/3ML nebulizer solution 3 mL  3 mL Nebulization Q6H PRN Smith, Rondell A, MD      . oxyCODONE (Oxy IR/ROXICODONE) immediate release tablet 5 mg  5 mg Oral Q4H PRN Shelly Coss, MD   5 mg at 07/07/17 1813  . simvastatin (ZOCOR) tablet 20 mg  20 mg Oral QHS Smith, Rondell A, MD   20 mg at 07/07/17 2220  . tamsulosin (FLOMAX) capsule 0.4 mg  0.4 mg Oral Daily Fuller Plan A, MD   0.4 mg at 07/08/17 4163    Musculoskeletal: Strength & Muscle Tone: within normal limits Gait & Station: UTA since patient was lying in bed. Patient leans: N/A  Psychiatric Specialty Exam: Physical Exam  Nursing note and vitals reviewed. Constitutional: He is oriented to person,  place, and time. He appears well-developed and well-nourished.  HENT:  Head: Normocephalic and atraumatic.  Neck: Normal range of motion.  Respiratory: Effort normal.  Musculoskeletal: Normal range of motion.  Neurological: He is alert and oriented to person, place, and time.  Skin: No rash noted.  Psychiatric: He has a normal mood and affect. His speech is normal and behavior is normal. Judgment and thought content normal. Cognition and memory are normal.    Review of Systems  Musculoskeletal: Positive for back pain.  Psychiatric/Behavioral: Positive for depression. Negative for hallucinations, substance abuse and suicidal ideas. The patient is nervous/anxious and has insomnia.   All other systems reviewed and are negative.   Blood pressure (!) 169/96, pulse 91, temperature 97.6 F (36.4 C), temperature source Oral, resp. rate 20, height 5' 10"  (1.778 m), weight 115.7 kg (255 lb 1.6 oz), SpO2 98 %.Body mass index is 36.6 kg/m.  General Appearance: Fairly Groomed, obese, elderly, Caucasian male with long  hair and unshaved face who is lying in bed. NAD.   Eye Contact:  Good  Speech:  Clear and Coherent and Normal Rate  Volume:  Normal  Mood:  Anxious  Affect:  Appropriate and Full Range  Thought Process:  Goal Directed, Linear and Descriptions of Associations: Intact  Orientation:  Full (Time, Place, and Person)  Thought Content:  Logical  Suicidal Thoughts:  No  Homicidal Thoughts:  No  Memory:  Immediate;   Good Recent;   Good Remote;   Good  Judgement:  Fair  Insight:  Fair  Psychomotor Activity:  Normal  Concentration:  Concentration: Good and Attention Span: Good  Recall:  Good  Fund of Knowledge:  Good  Language:  Good  Akathisia:  No  Handed:  Right  AIMS (if indicated):   N/A  Assets:  Communication Skills Desire for Improvement Financial Resources/Insurance Housing Social Support  ADL's:  Intact  Cognition:  WNL  Sleep:   Poor   Assessment:  Peter Weiss is a 77 y.o. male who was admitted with AMS. He is pleasant and appropriate on interview. He is oriented to person, place and time. He is able to provide his medical conditions. Today he is fortunately willing to stay in the hospital until he is medically cleared.  He demonstrates capacity to make medical decisions to leave AMA at this time.   Treatment Plan Summary: -Patient demonstrates capacity to leave AMA but fortunately he is agreeable to staying in the hospital until he is medically cleared. -Please restart Zoloft 50 mg daily for mood and anxiety if patient is still taking this as home medication. -Continue Donepezil 10 mg qhs for dementia.  -Psychiatry will sign off on patient at this time. Please consult psychiatry again as needed.    Disposition: No evidence of imminent risk to self or others at present.   Patient does not meet criteria for psychiatric inpatient admission.  Faythe Dingwall, DO 07/08/2017 12:48 PM

## 2017-07-08 NOTE — Clinical Social Work Note (Signed)
IVC paperwork has been completed. At this time MD does not want to IVC pt. Psychiatry has been consulted-- to evaluate. CSW will continue to follow.   Bear DanceBridget Epsie [place], [place]CSWA [phone]

## 2017-07-08 NOTE — Care Management Obs Status (Signed)
MEDICARE OBSERVATION STATUS NOTIFICATION   Patient Details  Name: Peter Weiss MRN: [id number] Date of Birth: 06/27/1940   Medicare Observation Status Notification Given:  Yes    Kermit BaloKelli F Zaylin Pistilli, RN 07/08/2017, 3:59 PM

## 2017-07-08 NOTE — Progress Notes (Signed)
PROGRESS NOTE    Peter Weiss  JXB:[id number] DOB: 1940/08/24 DOA: 07/06/2017 PCP: Fleet Contras, MD   Brief Narrative: Patient is a 77 year old male with past medical history of hypertension, hyperlipidemia, diastolic CHF, dementia who was brought to the emergency department because of altered mental status.  Patient altered mental status was suspected secondary to his medication changes.  Patient was also found to be hypotensive on presentation and responded to the IV fluids.  Patient found to have acute kidney injury on presentation.  Start on gentle IV fluids.  Urinalysis was positive for UTI, so urine culture sent and started on IV antibiotics. He is hemodynamically stable. Electrolytes are normal.  He also underwent ultrasound of the kidneys which were normal. Patient became extremely agitated this morning and wanted to leave.  He has a history of adjustment disorder with depressed mood/anxiety.  I suspect he is not compliant with his medications.  I have requested for psychiatric consultation for adjustment of his medications and possible capacity determination.    Assessment & Plan:   Active Problems:   DM2 (diabetes mellitus, type 2) (HCC)   Adjustment disorder with mixed anxiety and depressed mood   Hypotension   Acute encephalopathy   Acute lower UTI   Dementia   Normocytic anemia   Acute encephalopathy: Much improved.  He is intermittently confused this could be attributed to his dementia.  Currently he is alert and oriented.  His mental status waxes and wanes.  Agitation/adjustment disorder: He has history of adjustment disorder with depressed mood and anxiety.  I strongly suspect he is not compliant with his medication and he is not able to make his decisions. I have requested for psychiatric consultation at least to adjust his medications.  I  will greatly appreciate the help.  Hypotension:   Patient's initial blood pressures were noted to be as low as 60/40.   Blood pressures improved after initial fluid boluses.  Prolonged QTC interval: Patient agitated and EKG not possible this morning.QTC 536 on presentation.  Possible urinary tract infection : Urine culture did not show any growth.  Antibiotic discontinued   Acute kidney injury: Patient presents with BUN 23 and creatinine 1.69.  Review of records shows that baseline kidney function previously had been within normal limits.  Kidney function improving with IV fluids.  Diastolic CHF: Patient appears to be hypovolemic at this time.  Last EF noted to be 65 to 70% in 2016. Strict intake and output and daily weights  History of dementia   Diabetes mellitus type 2: Patient appears to be on metformin .  Initial blood glucose elevated 199 on admission. Continue sliding scale insulin..  Normocytic anemia: Hemoglobin  stable   DVT prophylaxis: Heparin Code Status: Full Family Communication: None Disposition Plan: Pending psychiatric evaluation   Consultants: None  Procedures: None  Antimicrobials: None  Subjective: Patient seen and examined the bedside this morning.  Verbally very aggressive.  Wants to leave  Objective: Vitals:   07/07/17 2020 07/08/17 0000 07/08/17 1401 07/08/17 1433  BP: (!) 151/92 (!) 169/96 (!) 179/95   Pulse: 78 91 100   Resp: 18 20 18    Temp: 98.4 F (36.9 C) 97.6 F (36.4 C) 98.2 F (36.8 C)   TempSrc: Oral Oral Oral   SpO2: 99% 98% 98%   Weight:    114.7 kg (252 lb 13.9 oz)  Height:        Intake/Output Summary (Last 24 hours) at 07/08/2017 1529 Last data filed at 07/08/2017 1200  Gross per 24 hour  Intake 580 ml  Output 800 ml  Net -220 ml   Filed Weights   07/07/17 1259 07/07/17 1400 07/08/17 1433  Weight: 113.4 kg (250 lb) 115.7 kg (255 lb 1.6 oz) 114.7 kg (252 lb 13.9 oz)    Examination:  General exam: Aggressive,agitatedNot in distress,average built [employer] nervous system: Alert . No obvious focal neurological deficits. Extremities: No  edema Skin: No rashes, lesions or ulcers,no icterus ,no pallor MSK: Normal muscle bulk,tone ,power Psychiatry: Judgement and insight appear impaired   Data Reviewed: I have personally reviewed following labs and imaging studies  CBC: Recent Labs  Lab 07/01/17 1827 07/06/17 2105 07/06/17 2141 07/07/17 0236  WBC 6.8 8.4  --  8.2  NEUTROABS 4.8 6.4  --   --   HGB 10.2* 10.9* 11.2* 10.3*  HCT 32.9* 35.2* 33.0* 32.4*  MCV 93.7 93.6  --  93.1  PLT 195 257  --  218   Basic Metabolic Panel: Recent Labs  Lab 07/01/17 1827 07/06/17 2105 07/06/17 2141 07/07/17 0236 07/07/17 0450 07/08/17 0329  NA 142 140 140 139  --  139  K 4.0 5.0 4.4 4.4  --  4.1  CL 108 104 106 106  --  104  CO2 26 28  --  23  --  25  GLUCOSE 175* 199* 187* 173*  --  152*  BUN 28* 23* 23* 25*  --  27*  CREATININE 1.34* 1.69* 1.50* 1.64*  --  1.31*  CALCIUM 8.9 9.0  --  8.6*  --  8.8*  MG  --   --   --   --  1.8  --    GFR: Estimated Creatinine Clearance: 61.8 mL/min (A) (by C-G formula based on SCr of 1.31 mg/dL (H)). Liver Function Tests: Recent Labs  Lab 07/01/17 1827 07/06/17 2105  AST 24 23  ALT 18 16*  ALKPHOS 57 57  BILITOT 0.7 0.9  PROT 6.9 6.5  ALBUMIN 3.5 3.4*   No results for input(s): LIPASE, AMYLASE in the last 168 hours. Recent Labs  Lab 07/06/17 2105  AMMONIA 30   Coagulation Profile: No results for input(s): INR, PROTIME in the last 168 hours. Cardiac Enzymes: Recent Labs  Lab 07/01/17 1827 07/07/17 0450 07/07/17 1058 07/07/17 1700  CKTOTAL 77  --   --   --   TROPONINI  --  <0.03 <0.03 <0.03   BNP (last 3 results) No results for input(s): PROBNP in the last 8760 hours. HbA1C: No results for input(s): HGBA1C in the last 72 hours. CBG: Recent Labs  Lab 07/01/17 1835 07/06/17 2033 07/07/17 1649 07/07/17 2153  GLUCAP 156* 180* 216* 149*   Lipid Profile: No results for input(s): CHOL, HDL, LDLCALC, TRIG, CHOLHDL, LDLDIRECT in the last 72 hours. Thyroid Function  Tests: Recent Labs    07/07/17 0236  TSH 0.845   Anemia Panel: No results for input(s): VITAMINB12, FOLATE, FERRITIN, TIBC, IRON, RETICCTPCT in the last 72 hours. Sepsis Labs: Recent Labs  Lab 07/06/17 2129  LATICACIDVEN 1.57    Recent Results (from the past 240 hour(s))  Urine culture     Status: Abnormal   Collection Time: 07/01/17  6:27 PM  Result Value Ref Range Status   Specimen Description   Final    URINE, CLEAN CATCH Performed at [facility], [address], [place], [place] [address]    Special Requests   Final    NONE Performed at [facility], [address] [place], [place] [address]    Culture 80,000 COLONIES/mL PROTEUS MIRABILIS (A)  Final   Report Status 07/03/2017 FINAL  Final   Organism ID, Bacteria PROTEUS MIRABILIS (A)  Final      Susceptibility   Proteus mirabilis - MIC*    AMPICILLIN <=2 SENSITIVE Sensitive     CEFAZOLIN <=4 SENSITIVE Sensitive     CEFTRIAXONE <=1 SENSITIVE Sensitive     CIPROFLOXACIN <=0.25 SENSITIVE Sensitive     GENTAMICIN <=1 SENSITIVE Sensitive     IMIPENEM 2 SENSITIVE Sensitive     NITROFURANTOIN 128 RESISTANT Resistant     TRIMETH/SULFA <=20 SENSITIVE Sensitive     AMPICILLIN/SULBACTAM <=2 SENSITIVE Sensitive     PIP/TAZO <=4 SENSITIVE Sensitive     * 80,000 COLONIES/mL PROTEUS MIRABILIS  Blood Culture (routine x 2)     Status: None (Preliminary result)   Collection Time: 07/06/17  9:13 PM  Result Value Ref Range Status   Specimen Description BLOOD RIGHT HAND  Final   Special Requests   Final    BOTTLES DRAWN AEROBIC AND ANAEROBIC Blood Culture adequate volume   Culture   Final    NO GROWTH 2 DAYS Performed at [facility], [address], [place], [place] [address]    Report Status PENDING  Incomplete  Blood Culture (routine x 2)     Status: None (Preliminary result)   Collection Time: 07/06/17  9:18 PM  Result Value Ref Range Status   Specimen Description BLOOD RIGHT  HAND  Final   Special Requests   Final    BOTTLES DRAWN AEROBIC AND ANAEROBIC Blood Culture results may not be optimal due to an inadequate volume of blood received in culture bottles   Culture   Final    NO GROWTH 2 DAYS Performed at [facility], [address] [place], [place] [address]    Report Status PENDING  Incomplete  Urine culture     Status: None   Collection Time: 07/06/17 11:25 PM  Result Value Ref Range Status   Specimen Description URINE, CLEAN CATCH  Final   Special Requests NONE  Final   Culture   Final    NO GROWTH Performed at [facility], [address], [place], [place] [address]    Report Status 07/08/2017 FINAL  Final         Radiology Studies: US Renal  Result Date: 07/07/2017 CLINICAL DATA:  Acute kidney injury. EXAM: RENAL / URINARY TRACT ULTRASOUND COMPLETE COMPARISON:  Renal ultrasound January 11, 2009. CT abdomen and pelvis July 04, 2014 FINDINGS: Right Kidney: Length: 10 cm. Echogenicity within normal limits. No mass or hydronephrosis visualized. Left Kidney: Length: 11.8 cm. Echogenicity within normal limits. No mass or hydronephrosis visualized. Bladder: No discrete bladder identified, likely decompressed. IMPRESSION: Negative renal ultrasound. Electronically Signed   By: Awilda Metro M.D.   On: 07/07/2017 03:15   Dg Chest Port 1 View  Result Date: 07/06/2017 CLINICAL DATA:  History of CHF, hypertension, evaluate for infiltrate EXAM: PORTABLE CHEST 1 VIEW COMPARISON:  08/31/2016 FINDINGS: Post sternotomy changes. Slightly low lung volumes. No acute airspace disease or effusion. Aortic atherosclerosis. No pneumothorax. IMPRESSION: No active disease.  Hypoventilatory changes. Electronically Signed   By: Jasmine Pang M.D.   On: 07/06/2017 21:53        Scheduled Meds: . donepezil  10 mg Oral QHS  . enoxaparin (LOVENOX) injection  40 mg Subcutaneous Daily  . simvastatin  20 mg Oral QHS  .  tamsulosin  0.4 mg Oral Daily    Continuous Infusions: . sodium chloride 75 mL/hr at 07/07/17 1855     LOS: 0 days    Time spent: More than 50% of that time was spent in counseling and/or coordination of care.      Burnadette PopAmrit Duey Liller, MD [facility] Pager [phone]  If 7PM-7AM, please contact night-coverage [email or web address] Password [email or web address] 07/08/2017, 3:29 PM

## 2017-07-09 DIAGNOSIS — Z008 Encounter for other general examination: Secondary | ICD-10-CM

## 2017-07-09 LAB — BASIC METABOLIC PANEL
ANION GAP: 10 (ref 5–15)
BUN: 15 mg/dL (ref 6–20)
CALCIUM: 9 mg/dL (ref 8.9–10.3)
CO2: 27 mmol/L (ref 22–32)
CREATININE: 1.01 mg/dL (ref 0.61–1.24)
Chloride: 102 mmol/L (ref 101–111)
GFR calc non Af Amer: >60 mL/min (ref >60)
Glucose, Bld: 163 mg/dL — ABNORMAL HIGH (ref 65–99)
Potassium: 4 mmol/L (ref 3.5–5.1)
Sodium: 139 mmol/L (ref 135–145)

## 2017-07-09 LAB — GLUCOSE, CAPILLARY
Glucose-Capillary: 164 mg/dL — ABNORMAL HIGH (ref 65–99)
Glucose-Capillary: 199 mg/dL — ABNORMAL HIGH (ref 65–99)

## 2017-07-09 MED ORDER — SIMVASTATIN 20 MG PO TABS: 20.0000 mg | ORAL_TABLET | Freq: Every day | ORAL | 0 refills | Status: DC

## 2017-07-09 MED ORDER — DONEPEZIL HCL 10 MG PO TABS: 10.0000 mg | ORAL_TABLET | Freq: Every day | ORAL | 0 refills | Status: DC

## 2017-07-09 NOTE — Progress Notes (Signed)
NCM received consult:Needs PCP for follow up.  NCM spoke with pt @ bedside regarding PCP f/u. Pt states he definitely has a PCP and will f/u with PCP once d/c. States he can't remember PCP's name, has information @ home but will call for a f/u appointment. Gae Gallop RN,BSN,CM

## 2017-07-09 NOTE — Discharge Summary (Signed)
Physician Discharge Summary  Peter Weiss ZOX:[id number] DOB: [birth year] DOA: 07/06/2017  PCP: Fleet Contras, MD  Admit date: 07/06/2017 Discharge date: 07/09/2017  Admitted From: Home Disposition:  Home  Discharge Condition:Stable CODE STATUS:FULL Diet recommendation: Heart Healthy   Brief/Interim Summary:  Patient is a 77 year old male with past medical history of hypertension, hyperlipidemia,diastolic CHF, dementia who was brought to the emergency department because of altered mental status. Patient altered mental status was suspected secondary to his medication changes. Patient was also found to be hypotensive on presentation and responded to the IV fluids. Patient found to have acute kidney injury on presentation. Start on gentle IV fluids. Urinalysis was positive forUTI,so urine culture sent and started on IV antibiotics. He is hemodynamically stable. Electrolytes are normal. He also underwent ultrasound of the kidneys which were normal. He has a history of adjustment disorder with depressed mood/anxiety. psychiatric consultation done  for adjustment of his medications.  I recommended to continue Zoloft. Urine culture did not show any growth.  Antibiotics discontinued.  His kidney function improved and is on baseline with IV fluids.  Following problems were addressed during his hospitalization:  Acute encephalopathy:Resolved.Currently he is alert and oriented.    Agitation/adjustment disorder: He has history of adjustment disorder with depressed mood and anxiety.  Psychiatry consultation done.  Recommend to continue Zoloft.  Hypotension:Patient's initial blood pressures were noted to be as low as 60/40. Blood pressures improved with IV fluids.  Prolonged QTC interval: QTC 536 on presentation, improved to 518. Avoid further QT prolonging drugs.  Follow-up EKG as an outpatient during follow-up with the PCP.  Possible urinary tract infection : Urine culture  did not show any growth.  Antibiotic discontinued   Acute kidney injury:Patient presents withBUN 23 andcreatinine 1.69. Review of records shows that baseline kidney function previously had been within normal limits.  Kidney function improved to baseline with IV fluids.  DiastolicCHF:Patient appears to be hypovolemic at this time. Last EF noted to be 65 to 70% in 2016.  History of dementia: Stable.Continue home meds.  Diabetes mellitus type 2:Patient is  on metformin.Initial blood glucose elevated 199 on admission.  Normocytic anemia:Hemoglobin  stable     Discharge Diagnoses:  Principal Problem:   Evaluation by psychiatric service required Active Problems:   DM2 (diabetes mellitus, type 2) (HCC)   Adjustment disorder with mixed anxiety and depressed mood   Hypotension   Acute encephalopathy   Acute lower UTI   Dementia   Normocytic anemia    Discharge Instructions  Discharge Instructions    Diet - low sodium heart healthy   Complete by:  As directed    Discharge instructions   Complete by:  As directed    1) Follow up with your PCP in a week.   Increase activity slowly   Complete by:  As directed      Allergies as of 07/09/2017   No Known Allergies     Medication List    STOP taking these medications   erythromycin ophthalmic ointment   meloxicam 15 MG tablet Commonly known as:  MOBIC   omeprazole 20 MG capsule Commonly known as:  PRILOSEC     TAKE these medications   donepezil 10 MG tablet Commonly known as:  ARICEPT Take 1 tablet (10 mg total) by mouth at bedtime.   furosemide 20 MG tablet Commonly known as:  LASIX Take 20 mg by mouth daily as needed (leg swelling). What changed:  Another medication with the same name was removed. Continue  taking this medication, and follow the directions you see here.   gabapentin 400 MG capsule Commonly known as:  NEURONTIN Take 400 mg by mouth 4 (four) times daily.   ibuprofen 200 MG  tablet Commonly known as:  ADVIL,MOTRIN Take 200 mg by mouth every 6 (six) hours as needed for moderate pain.   metFORMIN 500 MG tablet Commonly known as:  GLUCOPHAGE Take 500 mg by mouth 2 (two) times daily with a meal.   metoprolol tartrate 25 MG tablet Commonly known as:  LOPRESSOR Take 37.5 mg by mouth 2 (two) times daily.   potassium chloride 10 MEQ tablet Commonly known as:  K-DUR Take 10 mEq by mouth daily.   sertraline 50 MG tablet Commonly known as:  ZOLOFT Take 1 tablet (50 mg total) by mouth daily.   simvastatin 20 MG tablet Commonly known as:  ZOCOR Take 1 tablet (20 mg total) by mouth at bedtime.   tamsulosin 0.4 MG Caps capsule Commonly known as:  FLOMAX Take 1 capsule (0.4 mg total) by mouth daily.   tiZANidine 4 MG tablet Commonly known as:  ZANAFLEX Take 4 mg by mouth 2 (two) times daily as needed for muscle spasms.      Follow-up Information    Fleet Contras, MD. Schedule an appointment as soon as possible for a visit in 1 week(s).   Specialty:  Internal Medicine Contact information: [address] [place] [address] [phone]          No Known Allergies  Consultations: Psychiatry  Procedures/Studies: US Renal  Result Date: 07/07/2017 CLINICAL DATA:  Acute kidney injury. EXAM: RENAL / URINARY TRACT ULTRASOUND COMPLETE COMPARISON:  Renal ultrasound January 11, 2009. CT abdomen and pelvis July 04, 2014 FINDINGS: Right Kidney: Length: 10 cm. Echogenicity within normal limits. No mass or hydronephrosis visualized. Left Kidney: Length: 11.8 cm. Echogenicity within normal limits. No mass or hydronephrosis visualized. Bladder: No discrete bladder identified, likely decompressed. IMPRESSION: Negative renal ultrasound. Electronically Signed   By: Awilda Metro M.D.   On: 07/07/2017 03:15   Dg Chest Port 1 View  Result Date: 07/06/2017 CLINICAL DATA:  History of CHF, hypertension, evaluate for infiltrate EXAM: PORTABLE CHEST 1 VIEW  COMPARISON:  08/31/2016 FINDINGS: Post sternotomy changes. Slightly low lung volumes. No acute airspace disease or effusion. Aortic atherosclerosis. No pneumothorax. IMPRESSION: No active disease.  Hypoventilatory changes. Electronically Signed   By: Jasmine Pang M.D.   On: 07/06/2017 21:53       Subjective: Patient seen and examined the bedside.  Remains comfortable.  He is alert and oriented.  Stable for discharge to home today.  Discharge Exam: Vitals:   07/09/17 0842 07/09/17 1120  BP: (!) 143/96 (!) 161/94  Pulse: (!) 102 94  Resp: 16 20  Temp: 98.4 F (36.9 C) 98.2 F (36.8 C)  SpO2: 97% 98%   Vitals:   07/09/17 0350 07/09/17 0352 07/09/17 0842 07/09/17 1120  BP: (!) 173/90  (!) 143/96 (!) 161/94  Pulse: 87  (!) 102 94  Resp: Temp: 99.2 F (37.3 C)  98.4 F (36.9 C) 98.2 F (36.8 C)  TempSrc: Oral  Oral Oral  SpO2: 95%  97% 98%  Weight:  114.6 kg (252 lb 10.4 oz)    Height:        General: Pt is alert, awake, not in acute distress Cardiovascular: RRR, S1/S2 +, no rubs, no gallops Respiratory: CTA bilaterally, no wheezing, no rhonchi Abdominal: Soft, NT, ND, bowel sounds +  Extremities: no edema, no cyanosis    The results of significant diagnostics from this hospitalization (including imaging, microbiology, ancillary and laboratory) are listed below for reference.     Microbiology: Recent Results (from the past 240 hour(s))  Urine culture     Status: Abnormal   Collection Time: 07/01/17  6:27 PM  Result Value Ref Range Status   Specimen Description   Final    URINE, CLEAN CATCH Performed at [facility], [address] [place], [place] [address]    Special Requests   Final    NONE Performed at [facility], [address] [place], [place] [address]    Culture 80,000 COLONIES/mL PROTEUS MIRABILIS (A)  Final   Report Status 07/03/2017 FINAL  Final   Organism ID, Bacteria PROTEUS MIRABILIS (A)  Final       Susceptibility   Proteus mirabilis - MIC*    AMPICILLIN <=2 SENSITIVE Sensitive     CEFAZOLIN <=4 SENSITIVE Sensitive     CEFTRIAXONE <=1 SENSITIVE Sensitive     CIPROFLOXACIN <=0.25 SENSITIVE Sensitive     GENTAMICIN <=1 SENSITIVE Sensitive     IMIPENEM 2 SENSITIVE Sensitive     NITROFURANTOIN 128 RESISTANT Resistant     TRIMETH/SULFA <=20 SENSITIVE Sensitive     AMPICILLIN/SULBACTAM <=2 SENSITIVE Sensitive     PIP/TAZO <=4 SENSITIVE Sensitive     * 80,000 COLONIES/mL PROTEUS MIRABILIS  Blood Culture (routine x 2)     Status: None (Preliminary result)   Collection Time: 07/06/17  9:13 PM  Result Value Ref Range Status   Specimen Description BLOOD RIGHT HAND  Final   Special Requests   Final    BOTTLES DRAWN AEROBIC AND ANAEROBIC Blood Culture adequate volume   Culture   Final    NO GROWTH 3 DAYS Performed at [facility], [address], [place], [place] [address]    Report Status PENDING  Incomplete  Blood Culture (routine x 2)     Status: None (Preliminary result)   Collection Time: 07/06/17  9:18 PM  Result Value Ref Range Status   Specimen Description BLOOD RIGHT HAND  Final   Special Requests   Final    BOTTLES DRAWN AEROBIC AND ANAEROBIC Blood Culture results may not be optimal due to an inadequate volume of blood received in culture bottles   Culture   Final    NO GROWTH 3 DAYS Performed at [facility], [address] [place], [place] [address]    Report Status PENDING  Incomplete  Urine culture     Status: None   Collection Time: 07/06/17 11:25 PM  Result Value Ref Range Status   Specimen Description URINE, CLEAN CATCH  Final   Special Requests NONE  Final   Culture   Final    NO GROWTH Performed at [facility], [address] [place], [place] [address]    Report Status 07/08/2017 FINAL  Final     Labs: BNP (last 3 results) No results for input(s): BNP in the last 8760 hours. Basic Metabolic Panel: Recent Labs  Lab 07/06/17 2105  07/06/17 2141 07/07/17 0236 07/07/17 0450 07/08/17 0329 07/09/17 0637  NA 140 140 139  --  139 139  K 5.0 4.4 4.4  --  4.1 4.0  CL 104 106 106  --  104 102  CO2 28  --  23  --  25 27  GLUCOSE 199* 187* 173*  --  152* 163*  BUN 23* 23* 25*  --  27* 15  CREATININE 1.69* 1.50* 1.64*  --  1.31* 1.01  CALCIUM 9.0  --  8.6*  --  8.8* 9.0  MG  --   --   --  1.8  --   --    Liver Function Tests: Recent Labs  Lab 07/06/17 2105  AST 23  ALT 16*  ALKPHOS 57  BILITOT 0.9  PROT 6.5  ALBUMIN 3.4*   No results for input(s): LIPASE, AMYLASE in the last 168 hours. Recent Labs  Lab 07/06/17 2105  AMMONIA 30   CBC: Recent Labs  Lab 07/06/17 2105 07/06/17 2141 07/07/17 0236  WBC 8.4  --  8.2  NEUTROABS 6.4  --   --   HGB 10.9* 11.2* 10.3*  HCT 35.2* 33.0* 32.4*  MCV 93.6  --  93.1  PLT 257  --  218   Cardiac Enzymes: Recent Labs  Lab 07/07/17 0450 07/07/17 1058 07/07/17 1700  TROPONINI <0.03 <0.03 <0.03   BNP: Invalid input(s): POCBNP CBG: Recent Labs  Lab 07/07/17 1649 07/07/17 2153 07/08/17 2208 07/09/17 0608 07/09/17 1116  GLUCAP 216* 149* 153* 164* 199*   D-Dimer No results for input(s): DDIMER in the last 72 hours. Hgb A1c No results for input(s): HGBA1C in the last 72 hours. Lipid Profile No results for input(s): CHOL, HDL, LDLCALC, TRIG, CHOLHDL, LDLDIRECT in the last 72 hours. Thyroid function studies Recent Labs    07/07/17 0236  TSH 0.845   Anemia work up No results for input(s): VITAMINB12, FOLATE, FERRITIN, TIBC, IRON, RETICCTPCT in the last 72 hours. Urinalysis    Component Value Date/Time   COLORURINE AMBER (A) 07/06/2017 2325   APPEARANCEUR HAZY (A) 07/06/2017 2325   APPEARANCEUR Clear 03/19/2014 1200   LABSPEC 1.024 07/06/2017 2325   LABSPEC 1.009 03/19/2014 1200   PHURINE 6.0 07/06/2017 2325   GLUCOSEU 50 (A) 07/06/2017 2325   GLUCOSEU Negative 03/19/2014 1200   HGBUR NEGATIVE 07/06/2017 2325   BILIRUBINUR NEGATIVE 07/06/2017  2325   BILIRUBINUR Negative 03/19/2014 1200   KETONESUR NEGATIVE 07/06/2017 2325   PROTEINUR 100 (A) 07/06/2017 2325   UROBILINOGEN 0.2 07/04/2014 1003   NITRITE NEGATIVE 07/06/2017 2325   LEUKOCYTESUR NEGATIVE 07/06/2017 2325   LEUKOCYTESUR Negative 03/19/2014 1200   Sepsis Labs Invalid input(s): PROCALCITONIN,  WBC,  LACTICIDVEN Microbiology Recent Results (from the past 240 hour(s))  Urine culture     Status: Abnormal   Collection Time: 07/01/17  6:27 PM  Result Value Ref Range Status   Specimen Description   Final    URINE, CLEAN CATCH Performed at [facility], [address], [place], [place] [address]    Special Requests   Final    NONE Performed at [facility] [address], [place], [place] [address]    Culture 80,000 COLONIES/mL PROTEUS MIRABILIS (A)  Final   Report Status 07/03/2017 FINAL  Final   Organism ID, Bacteria PROTEUS MIRABILIS (A)  Final      Susceptibility   Proteus mirabilis - MIC*    AMPICILLIN <=2 SENSITIVE Sensitive     CEFAZOLIN <=4 SENSITIVE Sensitive     CEFTRIAXONE <=1 SENSITIVE Sensitive     CIPROFLOXACIN <=0.25 SENSITIVE Sensitive     GENTAMICIN <=1 SENSITIVE Sensitive     IMIPENEM 2 SENSITIVE Sensitive     NITROFURANTOIN 128 RESISTANT Resistant     TRIMETH/SULFA <=20 SENSITIVE Sensitive     AMPICILLIN/SULBACTAM <=2 SENSITIVE Sensitive     PIP/TAZO <=4  SENSITIVE Sensitive     * 80,000 COLONIES/mL PROTEUS MIRABILIS  Blood Culture (routine x 2)     Status: None (Preliminary result)   Collection Time: 07/06/17  9:13 PM  Result Value Ref Range Status   Specimen Description BLOOD RIGHT HAND  Final   Special Requests   Final    BOTTLES DRAWN AEROBIC AND ANAEROBIC Blood Culture adequate volume   Culture   Final    NO GROWTH 3 DAYS Performed at [facility], [address] [place], [place] [address]    Report Status PENDING  Incomplete  Blood Culture (routine x 2)     Status: None (Preliminary  result)   Collection Time: 07/06/17  9:18 PM  Result Value Ref Range Status   Specimen Description BLOOD RIGHT HAND  Final   Special Requests   Final    BOTTLES DRAWN AEROBIC AND ANAEROBIC Blood Culture results may not be optimal due to an inadequate volume of blood received in culture bottles   Culture   Final    NO GROWTH 3 DAYS Performed at [facility], [address], [place], [place] [address]    Report Status PENDING  Incomplete  Urine culture     Status: None   Collection Time: 07/06/17 11:25 PM  Result Value Ref Range Status   Specimen Description URINE, CLEAN CATCH  Final   Special Requests NONE  Final   Culture   Final    NO GROWTH Performed at [facility], [address] [place], [place] [address]    Report Status 07/08/2017 FINAL  Final     Time coordinating discharge: 35 minutes  SIGNED:   Burnadette Pop, MD  [facility] 07/09/2017, 12:02 PM Pager [phone]  If 7PM-7AM, please contact night-coverage [email or web address] Password [email or web address]

## 2017-07-09 NOTE — Progress Notes (Signed)
Pt given discharge instructions and handout. No new concerns. Verbalized understanding and able to teach back. Discharged from unit in wheelchair per staff. Taxi voucher given to driver

## 2017-07-11 LAB — CULTURE, BLOOD (ROUTINE X 2)
Culture: NO GROWTH
Culture: NO GROWTH
SPECIAL REQUESTS: ADEQUATE

## 2017-10-03 IMAGING — CR DG CHEST 1V
1 series · 2 of 2 positions shown · non-contrast
Comparison: 07/04/2014

CLINICAL DATA: Patient fell on left side yesterday with left
posterior chest pain and dizziness

EXAM:
CHEST 1 VIEW

[Series 1: dg chest 1 view · 0.14mm/px · 2 of 2 slices shown]
[im 1/2]
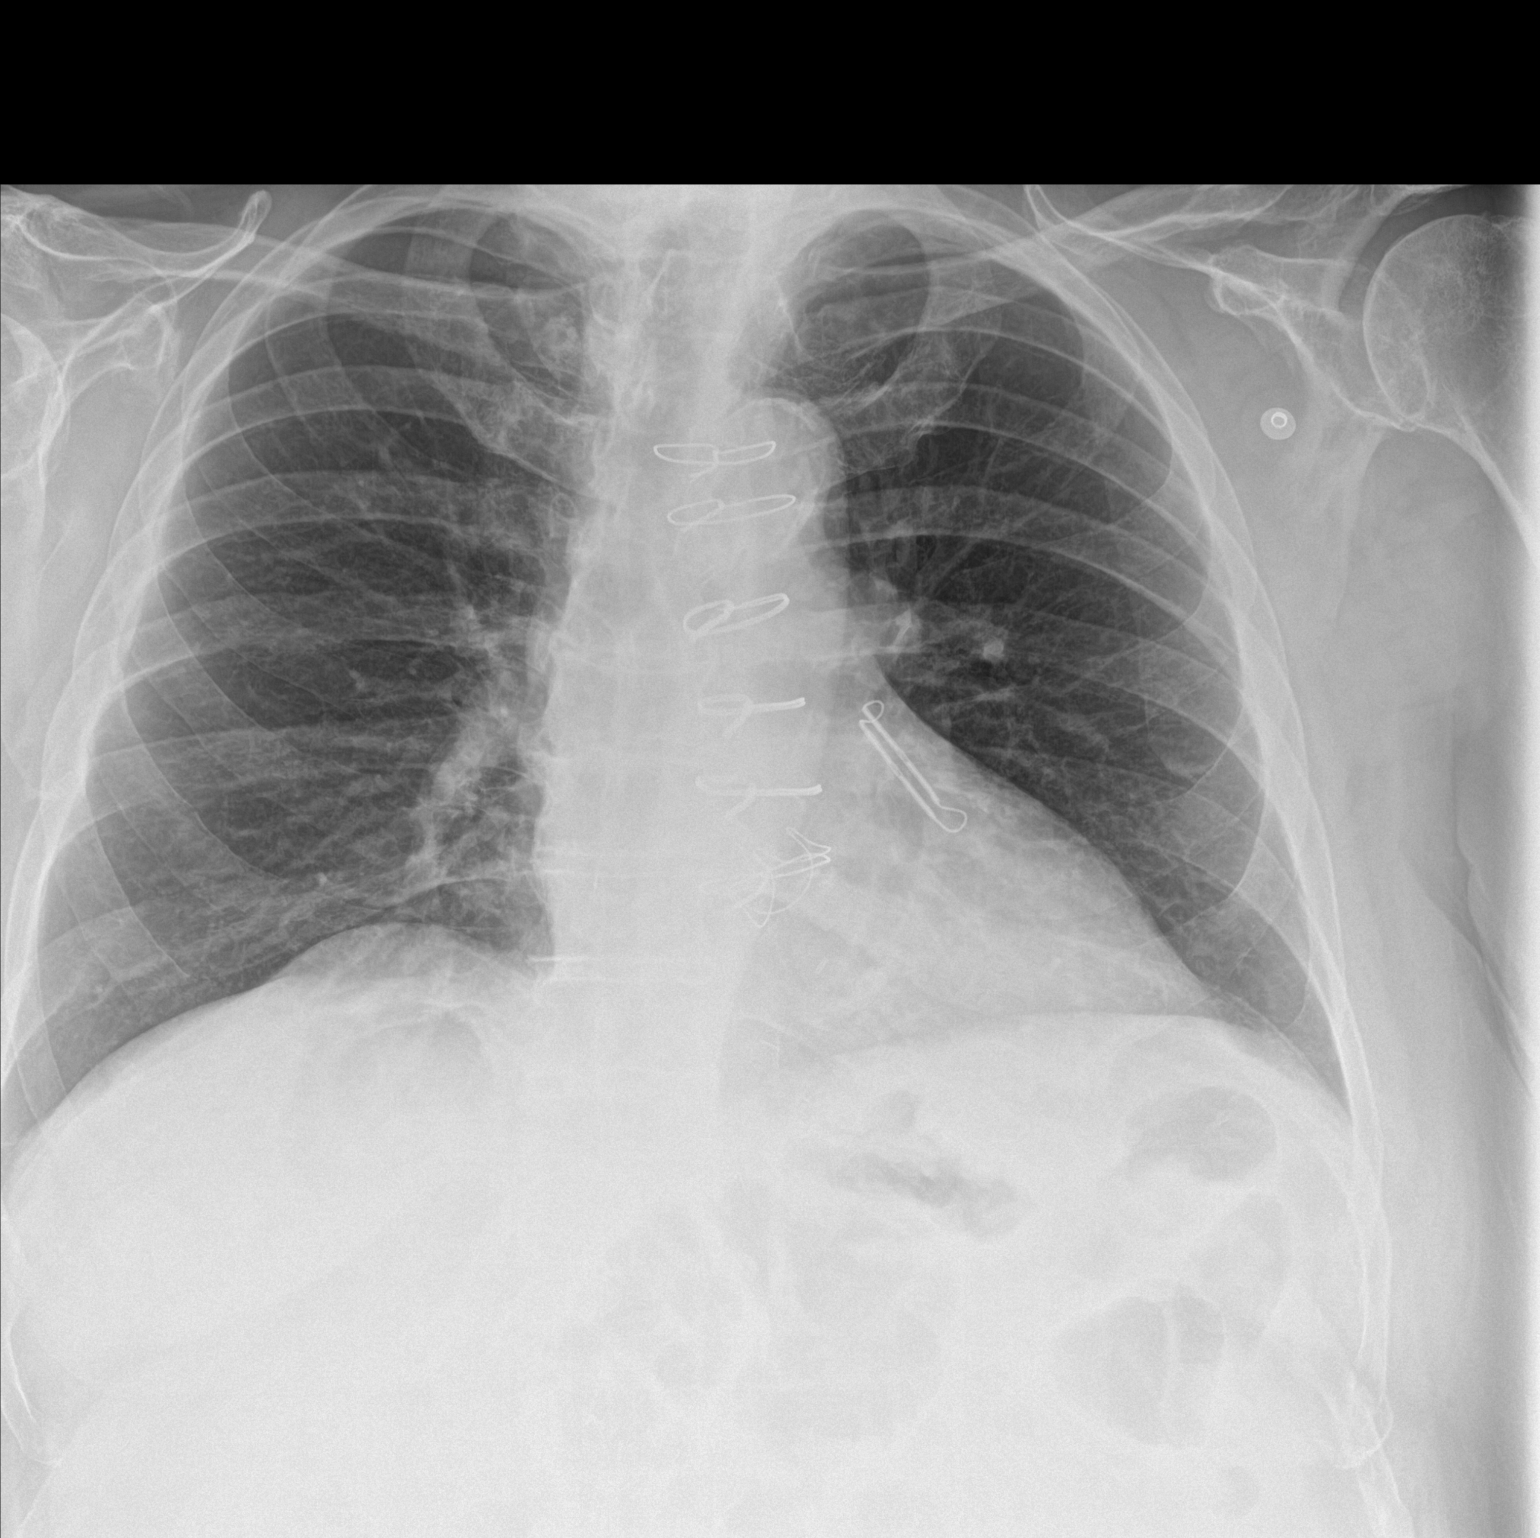
[im 2/2]
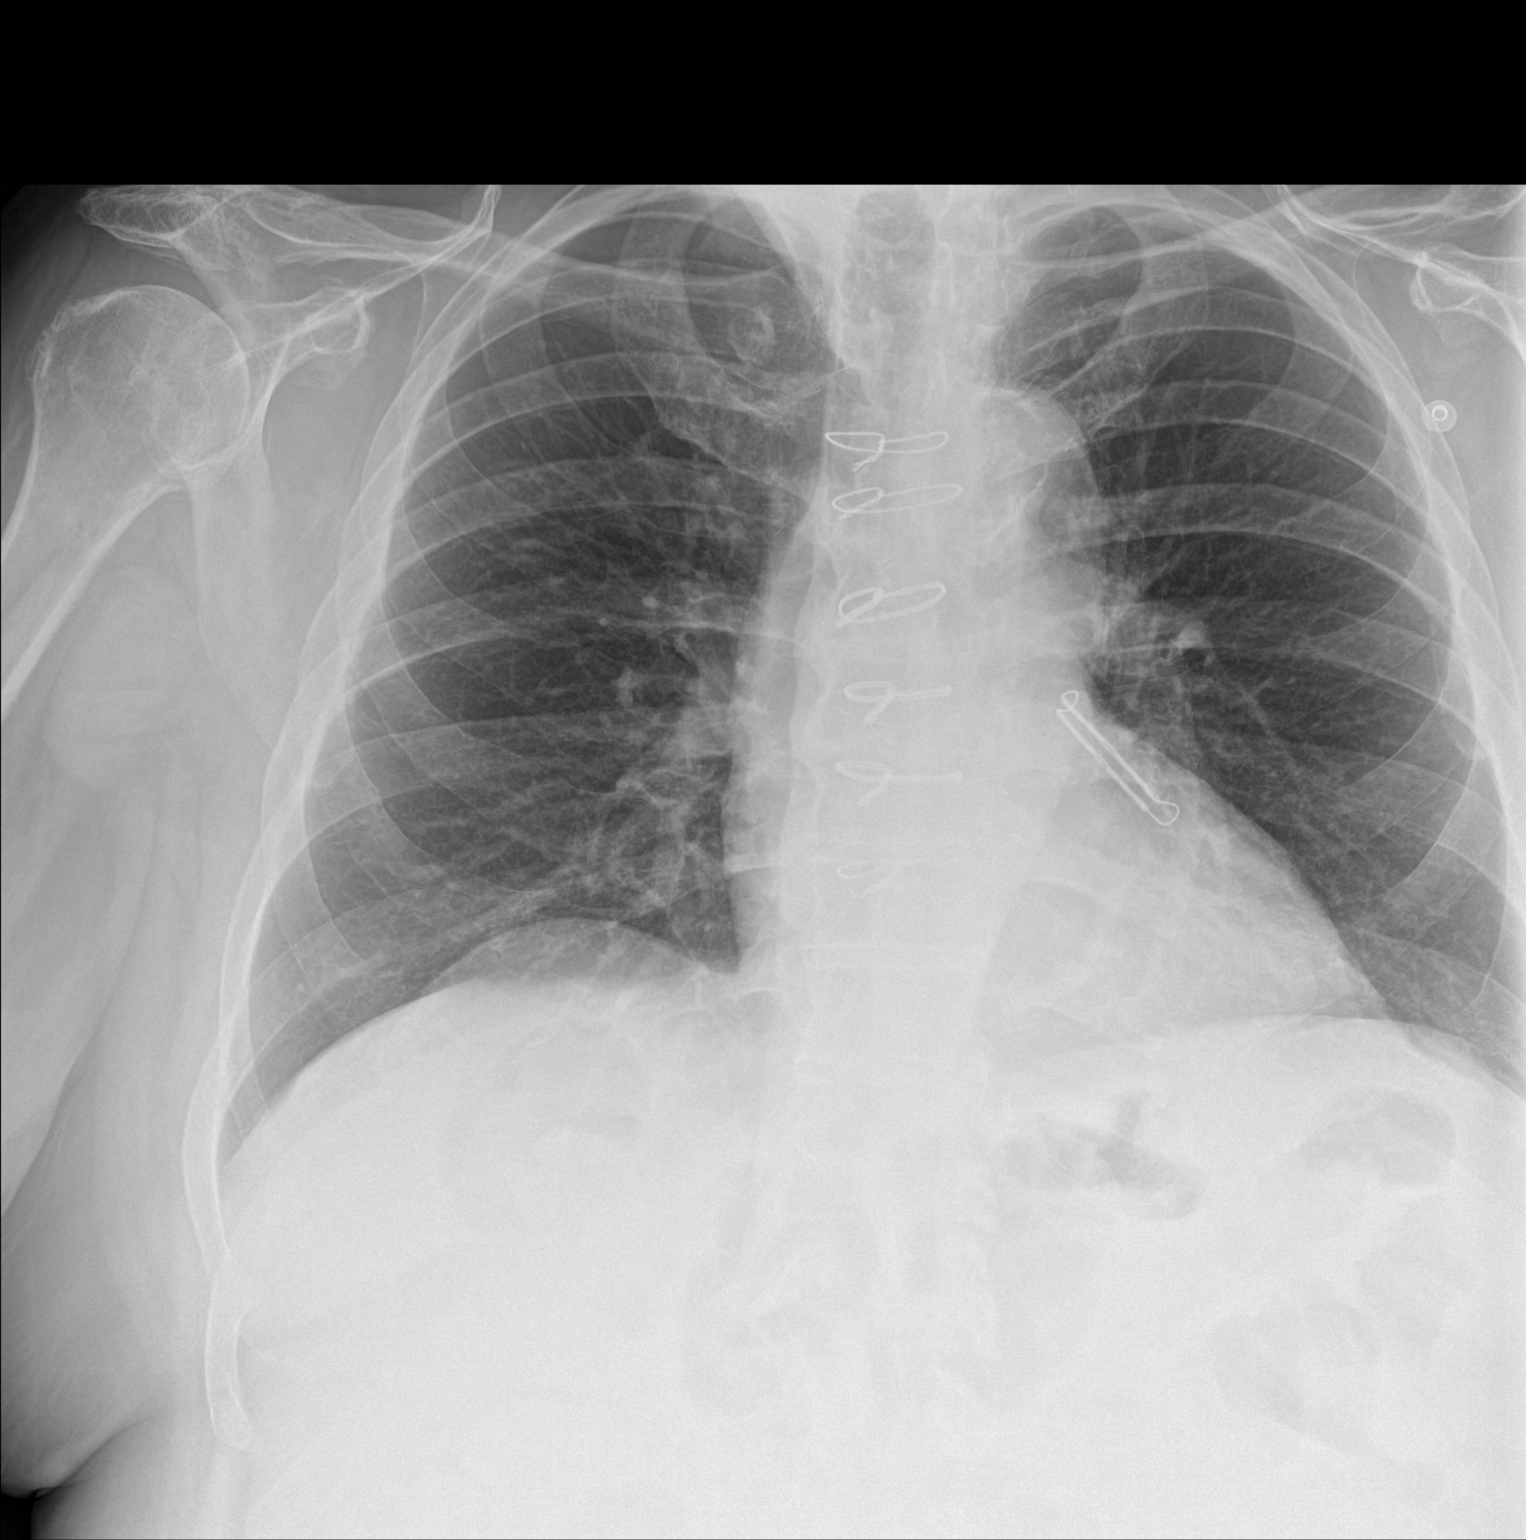

[2 of 2 positions shown; findings below may reference images not displayed]

FINDINGS: Mild cardiac enlargement stable as is left atrial appendage clip.
Status post median sternotomy. Stable aortic calcification.

Vascular pattern is normal. No consolidation effusion or
pneumothorax. Osseous thorax appears intact.
IMPRESSION: No active disease.

## 2017-10-03 IMAGING — CR DG HIP (WITH OR WITHOUT PELVIS) 2-3V*L*
1 series · 3 of 3 positions shown · non-contrast
Comparison: None.

CLINICAL DATA: Fell several times yesterday hitting the left side,
the patient is on blood thinner

EXAM:
DG HIP (WITH OR WITHOUT PELVIS) 2-3V LEFT

[Series 1: view not recorded · 0.14mm/px · 3 of 3 slices shown]
[im 1/3]
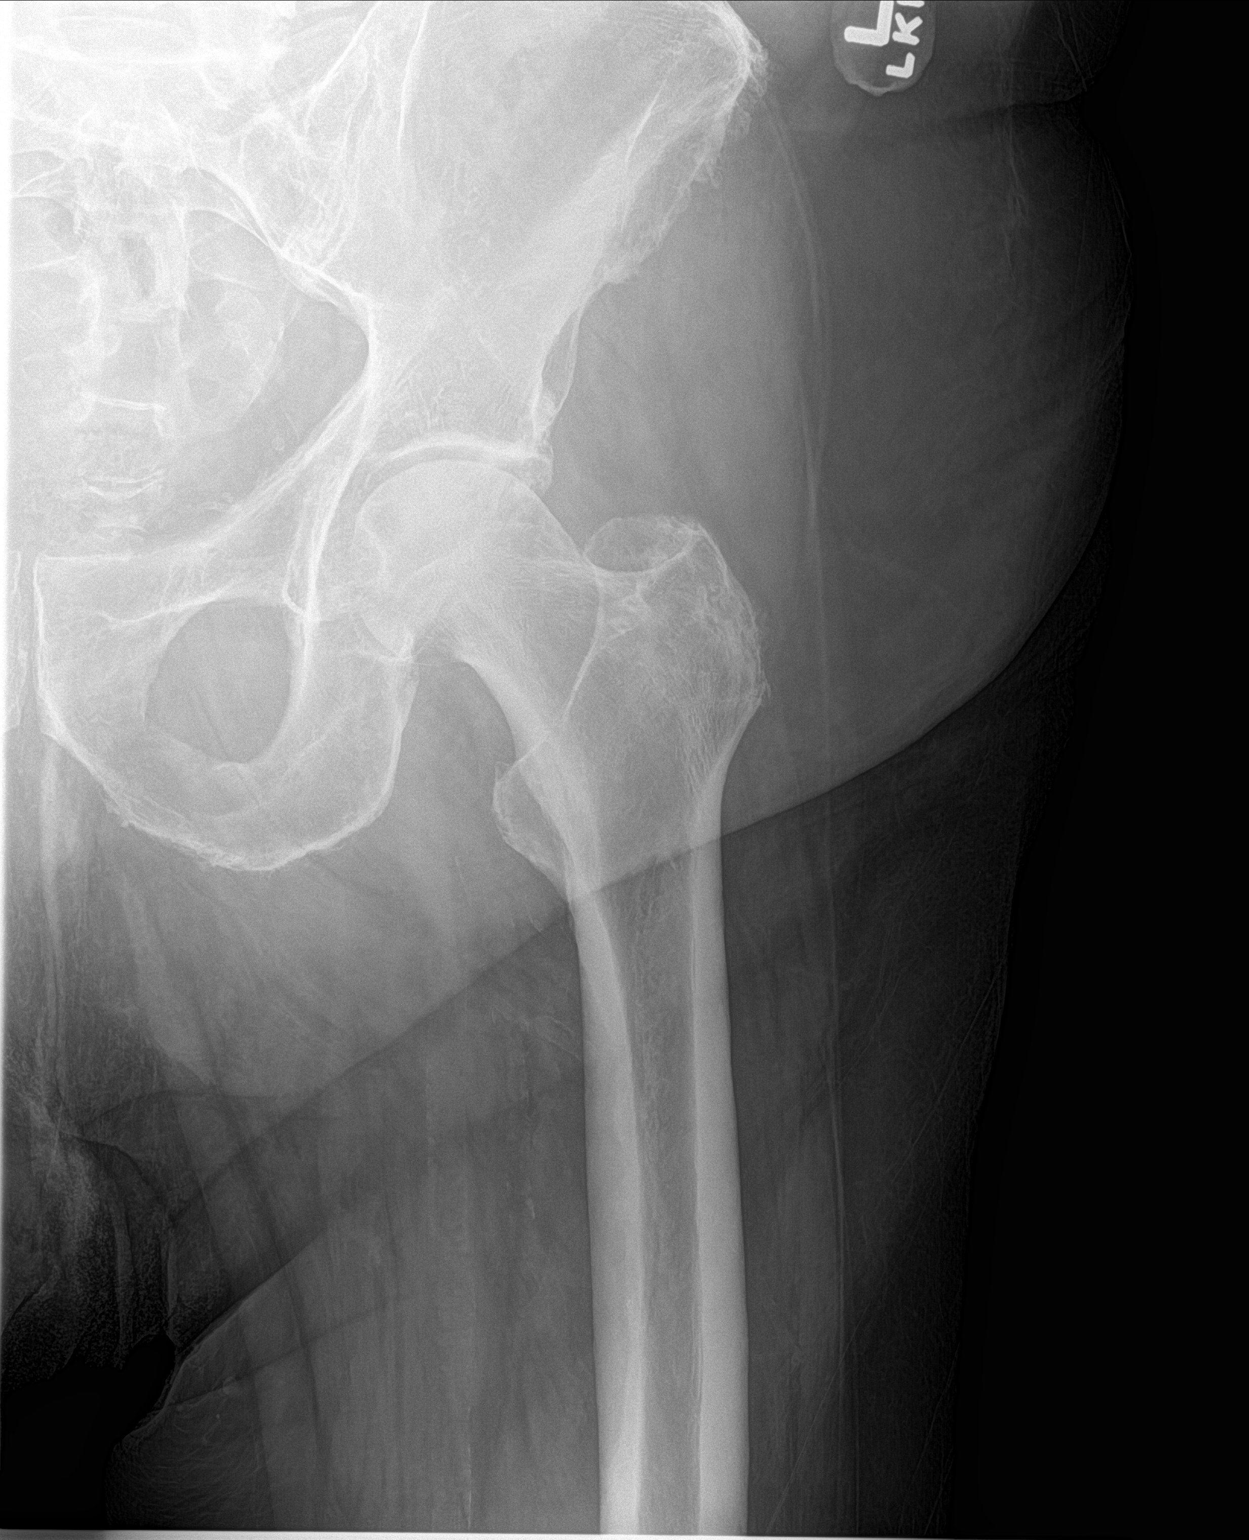
[im 2/3]
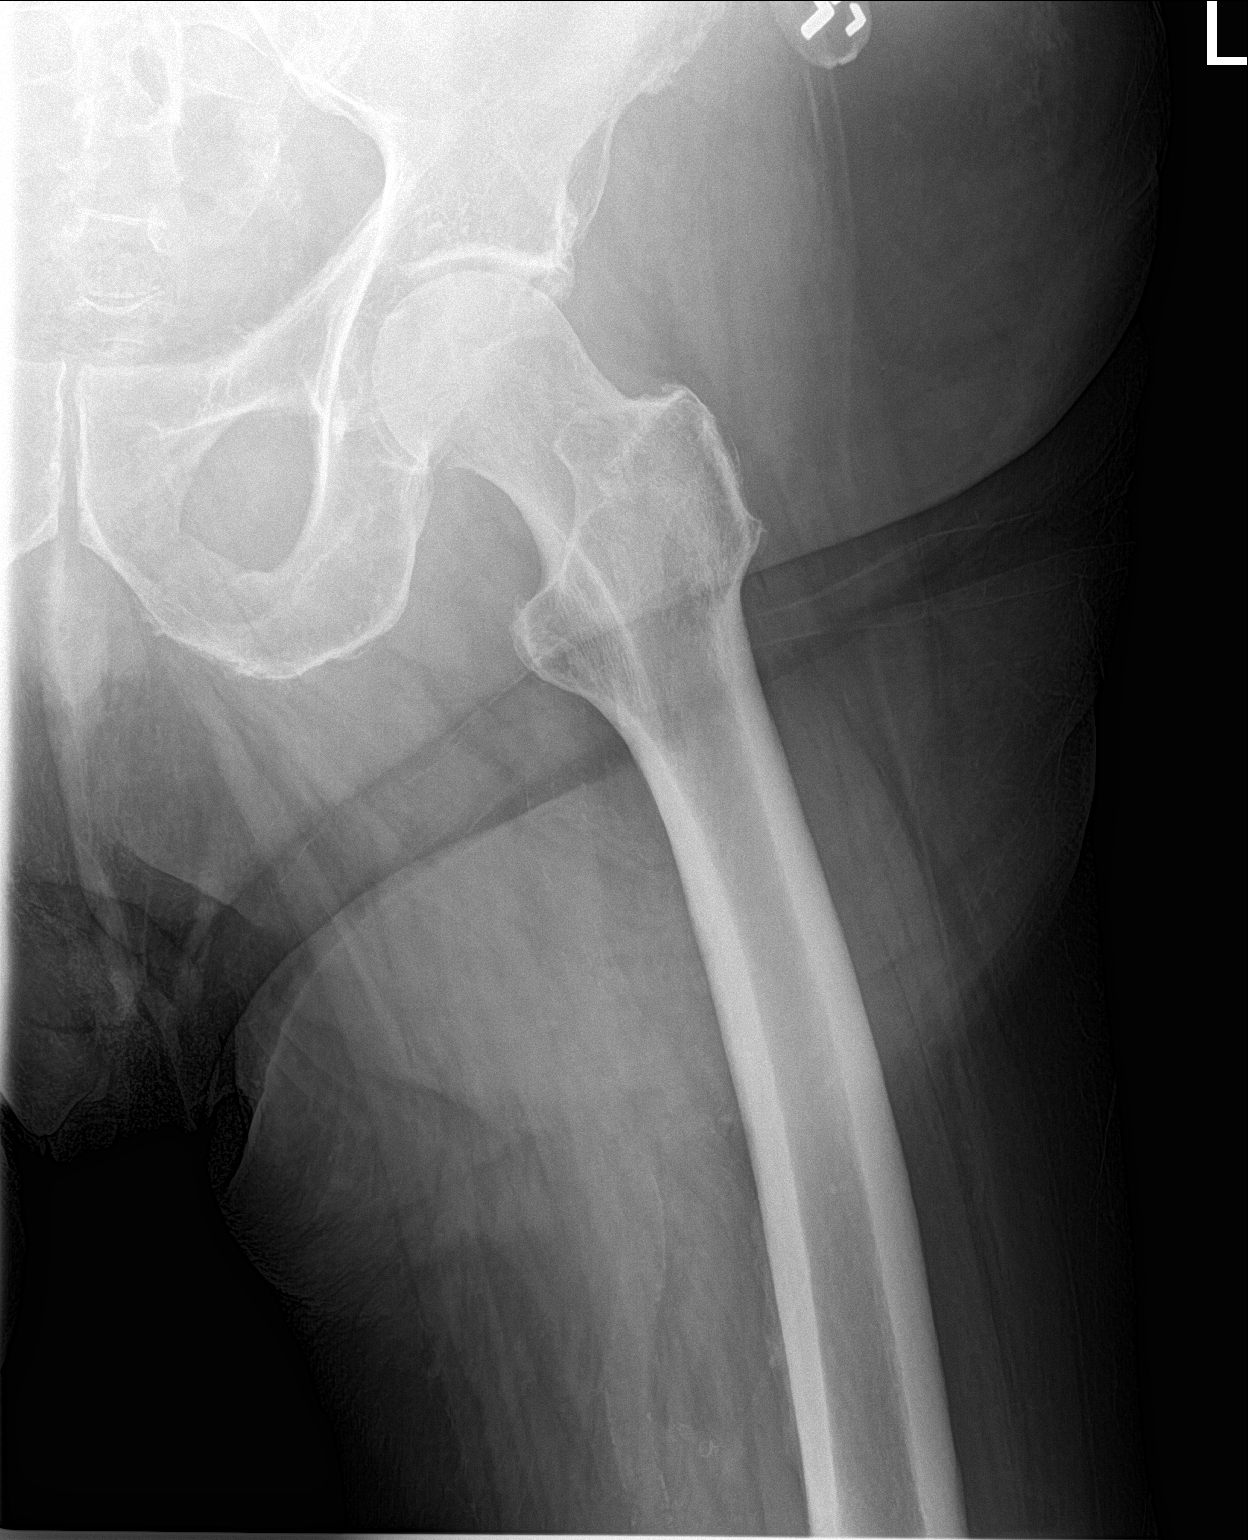
[im 3/3]
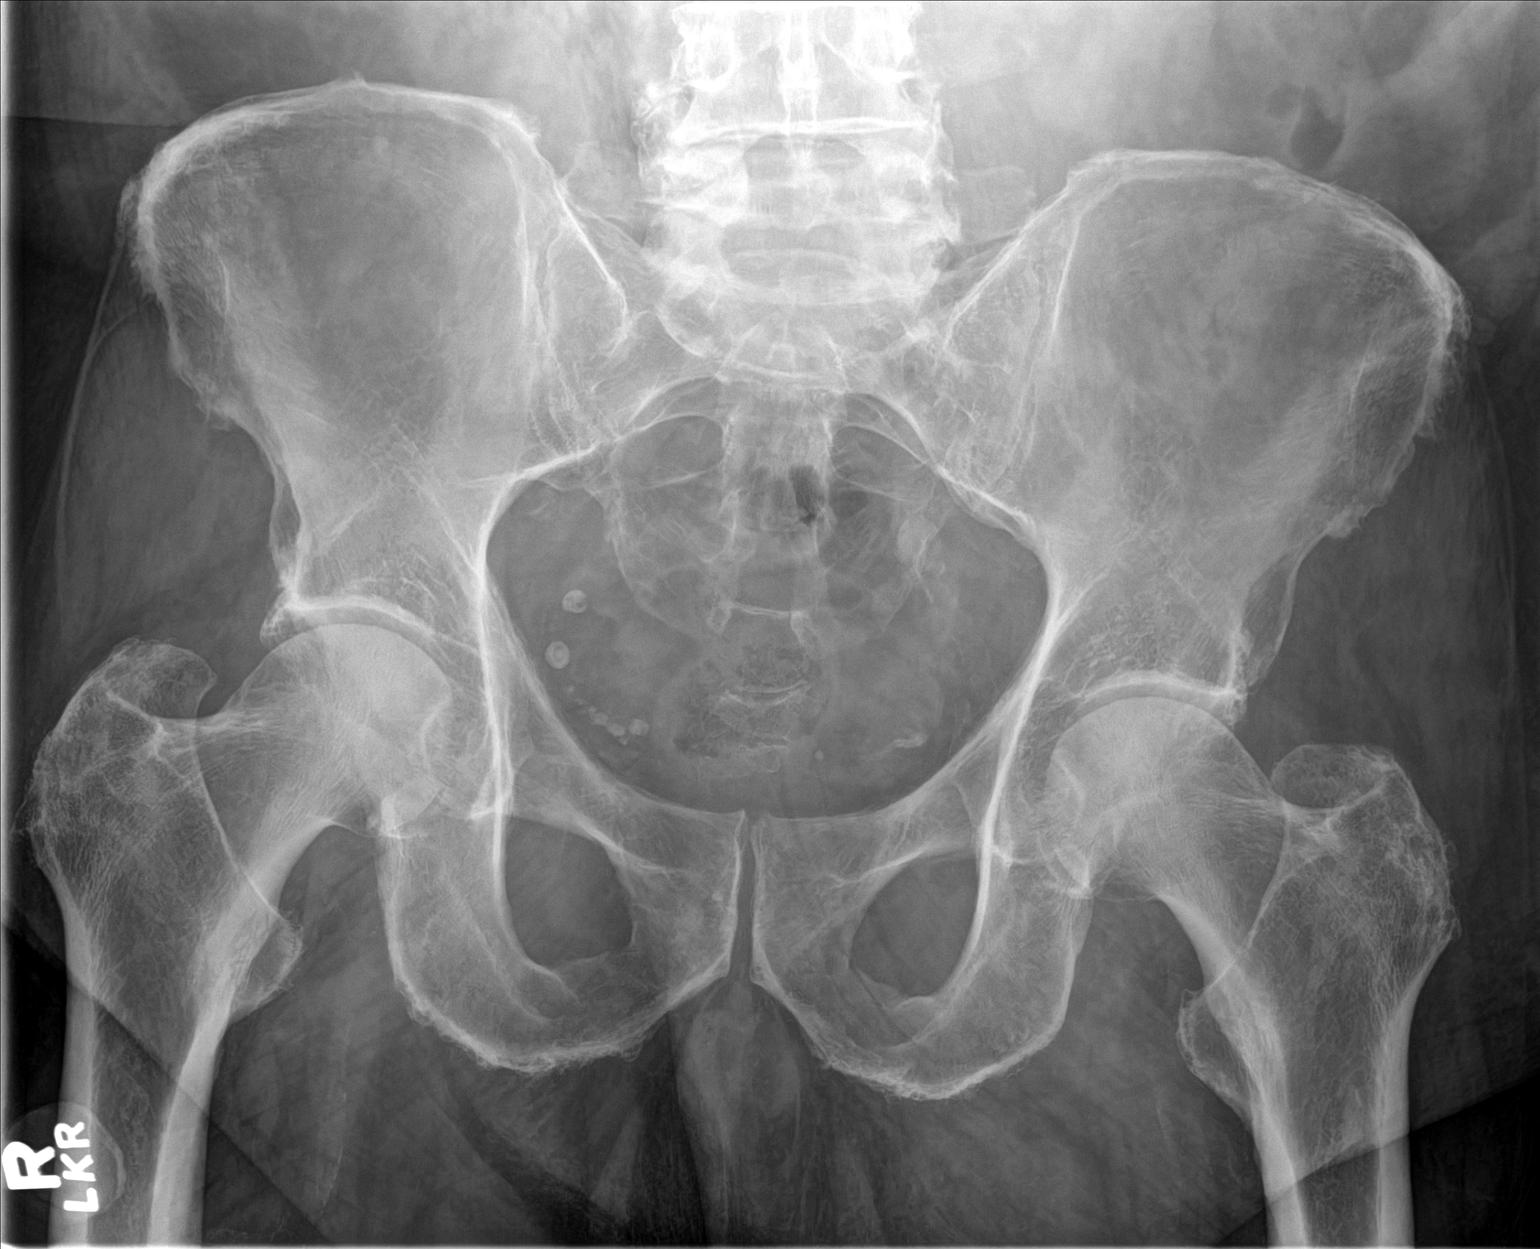

[3 of 3 positions shown; findings below may reference images not displayed]

FINDINGS: For age there is only mild degenerative joint disease of the hips
present. No acute hip fracture is seen. The pelvic rami are intact.
The bones are diffusely osteopenic. There are degenerative changes
the lower lumbar spine. The SI joints appear corticated.
IMPRESSION: No acute fracture. Only mild degenerative change of the hips for
age. Osteopenia.

## 2017-12-28 ENCOUNTER — Other Ambulatory Visit: Payer: Self-pay

## 2017-12-28 ENCOUNTER — Inpatient Hospital Stay (HOSPITAL_COMMUNITY)
Admission: EM | Admit: 2017-12-28 | Discharge: 2018-01-06 | DRG: 871 | Disposition: A | Discharge status: Hospice | Payer: Medicare Other | Attending: Internal Medicine | Admitting: Internal Medicine

## 2017-12-28 ENCOUNTER — Encounter (HOSPITAL_COMMUNITY): Payer: Self-pay | Admitting: Emergency Medicine

## 2017-12-28 ENCOUNTER — Emergency Department (HOSPITAL_COMMUNITY): Payer: Medicare Other

## 2017-12-28 DIAGNOSIS — I4510 Unspecified right bundle-branch block: Secondary | ICD-10-CM | POA: Diagnosis present

## 2017-12-28 DIAGNOSIS — E870 Hyperosmolality and hypernatremia: Secondary | ICD-10-CM | POA: Diagnosis not present

## 2017-12-28 DIAGNOSIS — R778 Other specified abnormalities of plasma proteins: Secondary | ICD-10-CM | POA: Diagnosis present

## 2017-12-28 DIAGNOSIS — B9562 Methicillin resistant Staphylococcus aureus infection as the cause of diseases classified elsewhere: Secondary | ICD-10-CM

## 2017-12-28 DIAGNOSIS — I6340 Cerebral infarction due to embolism of unspecified cerebral artery: Secondary | ICD-10-CM

## 2017-12-28 DIAGNOSIS — E1129 Type 2 diabetes mellitus with other diabetic kidney complication: Secondary | ICD-10-CM | POA: Diagnosis present

## 2017-12-28 DIAGNOSIS — Z781 Physical restraint status: Secondary | ICD-10-CM

## 2017-12-28 DIAGNOSIS — R627 Adult failure to thrive: Secondary | ICD-10-CM | POA: Diagnosis present

## 2017-12-28 DIAGNOSIS — D649 Anemia, unspecified: Secondary | ICD-10-CM | POA: Diagnosis present

## 2017-12-28 DIAGNOSIS — I6521 Occlusion and stenosis of right carotid artery: Secondary | ICD-10-CM | POA: Diagnosis present

## 2017-12-28 DIAGNOSIS — A4102 Sepsis due to Methicillin resistant Staphylococcus aureus: Principal | ICD-10-CM | POA: Diagnosis present

## 2017-12-28 DIAGNOSIS — Z66 Do not resuscitate: Secondary | ICD-10-CM

## 2017-12-28 DIAGNOSIS — I714 Abdominal aortic aneurysm, without rupture: Secondary | ICD-10-CM | POA: Diagnosis present

## 2017-12-28 DIAGNOSIS — N183 Chronic kidney disease, stage 3 unspecified: Secondary | ICD-10-CM | POA: Diagnosis present

## 2017-12-28 DIAGNOSIS — I669 Occlusion and stenosis of unspecified cerebral artery: Secondary | ICD-10-CM

## 2017-12-28 DIAGNOSIS — E878 Other disorders of electrolyte and fluid balance, not elsewhere classified: Secondary | ICD-10-CM | POA: Diagnosis present

## 2017-12-28 DIAGNOSIS — E876 Hypokalemia: Secondary | ICD-10-CM | POA: Diagnosis present

## 2017-12-28 DIAGNOSIS — E1140 Type 2 diabetes mellitus with diabetic neuropathy, unspecified: Secondary | ICD-10-CM | POA: Diagnosis present

## 2017-12-28 DIAGNOSIS — Z952 Presence of prosthetic heart valve: Secondary | ICD-10-CM

## 2017-12-28 DIAGNOSIS — Z953 Presence of xenogenic heart valve: Secondary | ICD-10-CM

## 2017-12-28 DIAGNOSIS — I10 Essential (primary) hypertension: Secondary | ICD-10-CM | POA: Diagnosis present

## 2017-12-28 DIAGNOSIS — N179 Acute kidney failure, unspecified: Secondary | ICD-10-CM | POA: Diagnosis present

## 2017-12-28 DIAGNOSIS — F329 Major depressive disorder, single episode, unspecified: Secondary | ICD-10-CM | POA: Diagnosis present

## 2017-12-28 DIAGNOSIS — I350 Nonrheumatic aortic (valve) stenosis: Secondary | ICD-10-CM

## 2017-12-28 DIAGNOSIS — G92 Toxic encephalopathy: Secondary | ICD-10-CM | POA: Diagnosis present

## 2017-12-28 DIAGNOSIS — I76 Septic arterial embolism: Secondary | ICD-10-CM | POA: Diagnosis present

## 2017-12-28 DIAGNOSIS — E871 Hypo-osmolality and hyponatremia: Secondary | ICD-10-CM | POA: Diagnosis present

## 2017-12-28 DIAGNOSIS — Z6833 Body mass index (BMI) 33.0-33.9, adult: Secondary | ICD-10-CM

## 2017-12-28 DIAGNOSIS — I330 Acute and subacute infective endocarditis: Secondary | ICD-10-CM

## 2017-12-28 DIAGNOSIS — Z833 Family history of diabetes mellitus: Secondary | ICD-10-CM

## 2017-12-28 DIAGNOSIS — I130 Hypertensive heart and chronic kidney disease with heart failure and stage 1 through stage 4 chronic kidney disease, or unspecified chronic kidney disease: Secondary | ICD-10-CM | POA: Diagnosis present

## 2017-12-28 DIAGNOSIS — R7989 Other specified abnormal findings of blood chemistry: Secondary | ICD-10-CM | POA: Diagnosis present

## 2017-12-28 DIAGNOSIS — Z515 Encounter for palliative care: Secondary | ICD-10-CM

## 2017-12-28 DIAGNOSIS — Z79899 Other long term (current) drug therapy: Secondary | ICD-10-CM

## 2017-12-28 DIAGNOSIS — E43 Unspecified severe protein-calorie malnutrition: Secondary | ICD-10-CM | POA: Diagnosis present

## 2017-12-28 DIAGNOSIS — G9341 Metabolic encephalopathy: Secondary | ICD-10-CM | POA: Diagnosis present

## 2017-12-28 DIAGNOSIS — Z811 Family history of alcohol abuse and dependence: Secondary | ICD-10-CM

## 2017-12-28 DIAGNOSIS — D696 Thrombocytopenia, unspecified: Secondary | ICD-10-CM | POA: Diagnosis present

## 2017-12-28 DIAGNOSIS — D6489 Other specified anemias: Secondary | ICD-10-CM | POA: Diagnosis present

## 2017-12-28 DIAGNOSIS — Z8719 Personal history of other diseases of the digestive system: Secondary | ICD-10-CM

## 2017-12-28 DIAGNOSIS — R4182 Altered mental status, unspecified: Secondary | ICD-10-CM | POA: Diagnosis not present

## 2017-12-28 DIAGNOSIS — Z8679 Personal history of other diseases of the circulatory system: Secondary | ICD-10-CM

## 2017-12-28 DIAGNOSIS — E860 Dehydration: Secondary | ICD-10-CM | POA: Diagnosis present

## 2017-12-28 DIAGNOSIS — A419 Sepsis, unspecified organism: Secondary | ICD-10-CM | POA: Diagnosis present

## 2017-12-28 DIAGNOSIS — T826XXA Infection and inflammatory reaction due to cardiac valve prosthesis, initial encounter: Secondary | ICD-10-CM | POA: Diagnosis present

## 2017-12-28 DIAGNOSIS — R6521 Severe sepsis with septic shock: Secondary | ICD-10-CM | POA: Diagnosis present

## 2017-12-28 DIAGNOSIS — Z8249 Family history of ischemic heart disease and other diseases of the circulatory system: Secondary | ICD-10-CM

## 2017-12-28 DIAGNOSIS — F0390 Unspecified dementia without behavioral disturbance: Secondary | ICD-10-CM | POA: Diagnosis present

## 2017-12-28 DIAGNOSIS — I4581 Long QT syndrome: Secondary | ICD-10-CM | POA: Diagnosis present

## 2017-12-28 DIAGNOSIS — Z9181 History of falling: Secondary | ICD-10-CM

## 2017-12-28 DIAGNOSIS — K810 Acute cholecystitis: Secondary | ICD-10-CM | POA: Diagnosis present

## 2017-12-28 DIAGNOSIS — N401 Enlarged prostate with lower urinary tract symptoms: Secondary | ICD-10-CM | POA: Diagnosis present

## 2017-12-28 DIAGNOSIS — R509 Fever, unspecified: Secondary | ICD-10-CM

## 2017-12-28 DIAGNOSIS — R29713 NIHSS score 13: Secondary | ICD-10-CM | POA: Diagnosis present

## 2017-12-28 DIAGNOSIS — E237 Disorder of pituitary gland, unspecified: Secondary | ICD-10-CM | POA: Diagnosis present

## 2017-12-28 DIAGNOSIS — Z7984 Long term (current) use of oral hypoglycemic drugs: Secondary | ICD-10-CM

## 2017-12-28 DIAGNOSIS — E785 Hyperlipidemia, unspecified: Secondary | ICD-10-CM | POA: Diagnosis present

## 2017-12-28 DIAGNOSIS — R0682 Tachypnea, not elsewhere classified: Secondary | ICD-10-CM

## 2017-12-28 DIAGNOSIS — I480 Paroxysmal atrial fibrillation: Secondary | ICD-10-CM | POA: Diagnosis present

## 2017-12-28 DIAGNOSIS — I359 Nonrheumatic aortic valve disorder, unspecified: Secondary | ICD-10-CM

## 2017-12-28 DIAGNOSIS — E1122 Type 2 diabetes mellitus with diabetic chronic kidney disease: Secondary | ICD-10-CM | POA: Diagnosis present

## 2017-12-28 DIAGNOSIS — M48062 Spinal stenosis, lumbar region with neurogenic claudication: Secondary | ICD-10-CM | POA: Diagnosis present

## 2017-12-28 DIAGNOSIS — Y712 Prosthetic and other implants, materials and accessory cardiovascular devices associated with adverse incidents: Secondary | ICD-10-CM | POA: Diagnosis present

## 2017-12-28 DIAGNOSIS — N39498 Other specified urinary incontinence: Secondary | ICD-10-CM | POA: Diagnosis present

## 2017-12-28 DIAGNOSIS — I5032 Chronic diastolic (congestive) heart failure: Secondary | ICD-10-CM | POA: Diagnosis present

## 2017-12-28 DIAGNOSIS — E872 Acidosis: Secondary | ICD-10-CM | POA: Diagnosis present

## 2017-12-28 DIAGNOSIS — R1310 Dysphagia, unspecified: Secondary | ICD-10-CM | POA: Diagnosis not present

## 2017-12-28 DIAGNOSIS — N400 Enlarged prostate without lower urinary tract symptoms: Secondary | ICD-10-CM | POA: Diagnosis present

## 2017-12-28 DIAGNOSIS — M5136 Other intervertebral disc degeneration, lumbar region: Secondary | ICD-10-CM | POA: Diagnosis present

## 2017-12-28 DIAGNOSIS — Z8619 Personal history of other infectious and parasitic diseases: Secondary | ICD-10-CM

## 2017-12-28 DIAGNOSIS — I482 Chronic atrial fibrillation, unspecified: Secondary | ICD-10-CM | POA: Diagnosis present

## 2017-12-28 DIAGNOSIS — I6349 Cerebral infarction due to embolism of other cerebral artery: Secondary | ICD-10-CM | POA: Diagnosis present

## 2017-12-28 DIAGNOSIS — R5381 Other malaise: Secondary | ICD-10-CM | POA: Diagnosis present

## 2017-12-28 DIAGNOSIS — E669 Obesity, unspecified: Secondary | ICD-10-CM | POA: Diagnosis present

## 2017-12-28 DIAGNOSIS — I248 Other forms of acute ischemic heart disease: Secondary | ICD-10-CM | POA: Diagnosis present

## 2017-12-28 DIAGNOSIS — R7881 Bacteremia: Secondary | ICD-10-CM

## 2017-12-28 DIAGNOSIS — F419 Anxiety disorder, unspecified: Secondary | ICD-10-CM | POA: Diagnosis present

## 2017-12-28 LAB — COMPREHENSIVE METABOLIC PANEL
ALT: 13 U/L (ref 0–44)
AST: 23 U/L (ref 15–41)
Albumin: 3.3 g/dL — ABNORMAL LOW (ref 3.5–5.0)
Alkaline Phosphatase: 45 U/L (ref 38–126)
Anion gap: 18 — ABNORMAL HIGH (ref 5–15)
BUN: 20 mg/dL (ref 8–23)
CHLORIDE: 96 mmol/L — AB (ref 98–111)
CO2: 21 mmol/L — ABNORMAL LOW (ref 22–32)
CREATININE: 1.45 mg/dL — AB (ref 0.61–1.24)
Calcium: 8.8 mg/dL — ABNORMAL LOW (ref 8.9–10.3)
GFR, EST AFRICAN AMERICAN: 53 mL/min — AB (ref >60)
GFR, EST NON AFRICAN AMERICAN: 46 mL/min — AB (ref >60)
Glucose, Bld: 227 mg/dL — ABNORMAL HIGH (ref 70–99)
POTASSIUM: 2.8 mmol/L — AB (ref 3.5–5.1)
Sodium: 135 mmol/L (ref 135–145)
Total Bilirubin: 1.9 mg/dL — ABNORMAL HIGH (ref 0.3–1.2)
Total Protein: 7.3 g/dL (ref 6.5–8.1)

## 2017-12-28 LAB — CBC WITH DIFFERENTIAL/PLATELET
Abs Immature Granulocytes: 0.18 10*3/uL — ABNORMAL HIGH (ref 0.00–0.07)
BASOS ABS: 0 10*3/uL (ref 0.0–0.1)
BASOS PCT: 0 %
EOS PCT: 1 %
Eosinophils Absolute: 0.2 10*3/uL (ref 0.0–0.5)
HEMATOCRIT: 33 % — AB (ref 39.0–52.0)
HEMOGLOBIN: 10.2 g/dL — AB (ref 13.0–17.0)
IMMATURE GRANULOCYTES: 1 %
LYMPHS PCT: 1 %
Lymphs Abs: 0.1 10*3/uL — ABNORMAL LOW (ref 0.7–4.0)
MCH: 27.5 pg (ref 26.0–34.0)
MCHC: 30.9 g/dL (ref 30.0–36.0)
MCV: 88.9 fL (ref 80.0–100.0)
MONO ABS: 0.4 10*3/uL (ref 0.1–1.0)
Monocytes Relative: 3 %
Neutro Abs: 13.2 10*3/uL — ABNORMAL HIGH (ref 1.7–7.7)
Neutrophils Relative %: 94 %
Platelets: 166 10*3/uL (ref 150–400)
RBC: 3.71 MIL/uL — AB (ref 4.22–5.81)
RDW: 15.9 % — AB (ref 11.5–15.5)
WBC Morphology: INCREASED
WBC: 14 10*3/uL — AB (ref 4.0–10.5)
nRBC: 0 % (ref 0.0–0.2)

## 2017-12-28 LAB — I-STAT VENOUS BLOOD GAS, ED
ACID-BASE DEFICIT: 1 mmol/L (ref 0.0–2.0)
BICARBONATE: 23.4 mmol/L (ref 20.0–28.0)
O2 SAT: 37 %
TCO2: 25 mmol/L (ref 22–32)
pCO2, Ven: 41.8 mmHg — ABNORMAL LOW (ref 44.0–60.0)
pH, Ven: 7.363 (ref 7.250–7.430)
pO2, Ven: 25 mmHg — CL (ref 32.0–45.0)

## 2017-12-28 LAB — I-STAT CG4 LACTIC ACID, ED: LACTIC ACID, VENOUS: 2.5 mmol/L — AB (ref 0.5–1.9)

## 2017-12-28 LAB — CBG MONITORING, ED: Glucose-Capillary: 197 mg/dL — ABNORMAL HIGH (ref 70–99)

## 2017-12-28 MED ORDER — POTASSIUM CHLORIDE CRYS ER 20 MEQ PO TBCR
20.0000 meq | EXTENDED_RELEASE_TABLET | Freq: Once | ORAL | Status: DC
  Filled 2017-12-28: qty 1

## 2017-12-28 MED ORDER — ACETAMINOPHEN 500 MG PO TABS
1000.0000 mg | ORAL_TABLET | Freq: Once | ORAL | Status: DC
  Filled 2017-12-28: qty 2

## 2017-12-28 MED ORDER — POTASSIUM CHLORIDE 10 MEQ/100ML IV SOLN
10.0000 meq | INTRAVENOUS | Status: AC
  Administered 2017-12-28 – 2017-12-29 (×2): 10 meq via INTRAVENOUS
  Filled 2017-12-28 (×2): qty 100

## 2017-12-28 MED ORDER — SODIUM CHLORIDE 0.9 % IV SOLN
2.0000 g | INTRAVENOUS | Status: AC
  Administered 2017-12-28: 2 g via INTRAVENOUS
  Filled 2017-12-28: qty 2

## 2017-12-28 MED ORDER — SODIUM CHLORIDE 0.9 % IV BOLUS
1000.0000 mL | Freq: Once | INTRAVENOUS | Status: AC
  Administered 2017-12-28: 1000 mL via INTRAVENOUS

## 2017-12-28 MED ORDER — VANCOMYCIN HCL 10 G IV SOLR
1500.0000 mg | INTRAVENOUS | Status: AC
  Administered 2017-12-28: 1500 mg via INTRAVENOUS
  Filled 2017-12-28: qty 1500

## 2017-12-28 NOTE — ED Provider Notes (Addendum)
[facility] EMERGENCY DEPARTMENT Provider Note   CSN: [id number] Arrival date & time: 12/28/17  2218     History   Chief Complaint Chief Complaint  Patient presents with  . Altered Mental Status    HPI Peter Weiss is a 77 y.o. male with a h/o of critical aortic valve stenosis s/p AVR, DM Type II, dementia, lumbar DDD, AAA, and GI bleed brought in by EMS to the emergency department with altered mental status.  EMS reports they were called out to an extended stay motel by hotel security.  The patient was not found in bed and covered in urine.  EMS reports that while they were there that an unknown woman came into the room and put the patient's dog into the bathroom and then left.  The woman, who did not identify herself, states that the patient is normally alert and oriented and walks his dog independently daily.  The patient is alert, and will respond to stimuli, but is not oriented to person, time, or place.  He is unable to answer questions appropriately does not follow commands.  Level V caveat secondary to altered mental status.  The history is provided by the EMS personnel. No language interpreter was used.    Past Medical History:  Diagnosis Date  . Aortic stenosis   . Arthritis   . CHF (congestive heart failure) (HCC)   . Chicken pox   . Depression   . Diabetes mellitus without complication (HCC)   . Heart murmur   . History of fainting spells of unknown cause   . Hyperlipidemia   . Hypertension   . Neuromuscular disorder (HCC)   . Urinary incontinence     Patient Active Problem List   Diagnosis Date Noted  . Sepsis (HCC) 12/29/2017  . Acute cholecystitis 12/29/2017  . CKD (chronic kidney disease), stage III (HCC) 12/29/2017  . Chronic diastolic CHF (congestive heart failure) (HCC) 12/29/2017  . BPH (benign prostatic hyperplasia) 12/29/2017  . Acute metabolic encephalopathy 12/29/2017  . Hypokalemia 12/29/2017  . Elevated troponin  12/29/2017  . Cholecystitis 12/29/2017  . Evaluation by psychiatric service required   . Acute encephalopathy 07/07/2017  . Acute lower UTI 07/07/2017  . Dementia (HCC) 07/07/2017  . Normocytic anemia 07/07/2017  . Multiple falls 12/23/2014  . DDD (degenerative disc disease), lumbar 09/09/2014  . Facet syndrome, lumbar 09/09/2014  . Spinal stenosis, lumbar region, with neurogenic claudication 09/09/2014  . Neuropathy due to secondary diabetes (HCC) 09/09/2014  . Upper GI bleed   . Acute blood loss anemia   . Abdominal aortic aneurysm (HCC)   . Essential hypertension   . Adjustment disorder with disturbance of conduct   . Chronic pain syndrome   . Urinary frequency   . Hemorrhagic shock (HCC) 07/04/2014  . S/P AVR (aortic valve replacement) 07/04/2014  . Chest pain 07/04/2014  . Gastrointestinal hemorrhage 07/04/2014  . Melena   . Acute kidney injury (HCC)   . Aortic stenosis   . Atrial fibrillation, chronic   . Dental caries   . Pyrexia   . Aortic valve stenosis, critical 04/20/2014  . Syncope and collapse 04/18/2014  . Dehydration 04/18/2014  . Hypotension 04/18/2014  . Lethargic 03/25/2014  . Adjustment disorder with mixed anxiety and depressed mood 03/25/2014  . Chronic back pain 09/28/2013  . Mild cognitive impairment 09/28/2013  . Type II diabetes mellitus with renal manifestations (HCC) 09/28/2013  . HLD (hyperlipidemia) 09/28/2013  . GERD (gastroesophageal reflux disease) 09/28/2013  Past Surgical History:  Procedure Laterality Date  . AORTIC VALVE REPLACEMENT N/A 04/26/2014   Procedure: AORTIC VALVE REPLACEMENT (AVR);  Surgeon: Loreli Slot, MD;  Location: [facility] OR;  Service: Open Heart Surgery;  Laterality: N/A;  . CARDIAC CATHETERIZATION  03/2014  . ESOPHAGOGASTRODUODENOSCOPY N/A 07/04/2014   Procedure: ESOPHAGOGASTRODUODENOSCOPY (EGD);  Surgeon: Jeani Hawking, MD;  Location: [facility] ENDOSCOPY;  Service: Endoscopy;  Laterality: N/A;  . goiter  2008  . MAZE  N/A 04/26/2014   Procedure: MAZE;  Surgeon: Loreli Slot, MD;  Location: [facility] OR;  Service: Open Heart Surgery;  Laterality: N/A;  . MULTIPLE EXTRACTIONS WITH ALVEOLOPLASTY N/A 04/24/2014   Procedure: Extraction of tooth #'s 2,3,5,12,13,14,15,20 with allveoloplasty and gross debridement of remaining dentition and sectioning of bridge at mesial of abutment tooth #32.;  Surgeon: Charlynne Pander, DDS;  Location: [facility] OR;  Service: Oral Surgery;  Laterality: N/A;  . TEE WITHOUT CARDIOVERSION N/A 04/26/2014   Procedure: TRANSESOPHAGEAL ECHOCARDIOGRAM (TEE);  Surgeon: Loreli Slot, MD;  Location: [facility] OR;  Service: Open Heart Surgery;  Laterality: N/A;        Home Medications    Prior to Admission medications   Medication Sig Start Date End Date Taking? Authorizing Provider  donepezil (ARICEPT) 10 MG tablet Take 1 tablet (10 mg total) by mouth at bedtime. 07/09/17   Burnadette Pop, MD  furosemide (LASIX) 20 MG tablet Take 20 mg by mouth daily as needed (leg swelling).    [provider]  gabapentin (NEURONTIN) 400 MG capsule Take 400 mg by mouth 4 (four) times daily.     [provider]  ibuprofen (ADVIL,MOTRIN) 200 MG tablet Take 200 mg by mouth every 6 (six) hours as needed for moderate pain.    [provider]  metFORMIN (GLUCOPHAGE) 500 MG tablet Take 500 mg by mouth 2 (two) times daily with a meal.    [provider]  metoprolol tartrate (LOPRESSOR) 25 MG tablet Take 37.5 mg by mouth 2 (two) times daily.     [provider]  potassium chloride (K-DUR) 10 MEQ tablet Take 10 mEq by mouth daily.    [provider]  sertraline (ZOLOFT) 50 MG tablet Take 1 tablet (50 mg total) by mouth daily. 07/31/14   Antonieta Iba, MD  simvastatin (ZOCOR) 20 MG tablet Take 1 tablet (20 mg total) by mouth at bedtime. 07/09/17   Burnadette Pop, MD  tamsulosin (FLOMAX) 0.4 MG CAPS capsule Take 1 capsule (0.4 mg total) by mouth daily. 07/31/14    Antonieta Iba, MD  tiZANidine (ZANAFLEX) 4 MG tablet Take 4 mg by mouth 2 (two) times daily as needed for muscle spasms.    [provider]    Family History Family History  Problem Relation Age of Onset  . Alcohol abuse Mother   . Heart disease Mother   . Diabetes Mother   . Heart disease Father   . Diabetes Father   . Cancer Mother   . Cancer Father   . Alcohol abuse Father   . Alcohol abuse Brother     Social History Social History   Tobacco Use  . Smoking status: Never Smoker  . Smokeless tobacco: Never Used  Substance Use Topics  . Alcohol use: Yes    Alcohol/week: 0.0 standard drinks  . Drug use: Yes    Types: Marijuana    Comment: once a month     Allergies   Patient has no known allergies.   Review of Systems  Review of Systems  Unable to perform ROS: Mental status change     Physical Exam Updated Vital Signs BP (!) 168/90   Pulse (!) 110   Temp (!) 100.6 F (38.1 C) (Rectal)   Resp (!) 23   Ht 6\' 1"  (1.854 m)   Wt 114.6 kg   SpO2 94%   BMI 33.33 kg/m   Physical Exam  Constitutional: He appears well-developed.  Strong odor of malodorous urine.   HENT:  Head: Normocephalic.  Eyes: Conjunctivae are normal. No scleral icterus.  Neck: Neck supple. No JVD present.  Cardiovascular: Normal rate and intact distal pulses. An irregularly irregular rhythm present. Exam reveals no gallop and no friction rub.  No murmur heard. Pulmonary/Chest: Effort normal. No stridor. No respiratory distress. He has no wheezes. He has no rales.  Well-healed mid-line scar.   Abdominal: Soft. He exhibits no distension. There is tenderness. There is guarding. No hernia.  Grimaces and says "ouch" in response to palpation diffusely throughout the abdomen. No focal tenderness.  Abdomen is obese, but non-distended. Normoactive BS.   Musculoskeletal:  Ecchymosis noted to the bilateral lower extremities.  No petechiae or purpura.  Neurological: He is alert.  Not  oriented to person, place, or time. Alert. Responds to painful stimuli.  Does not answer questions appropriately.  Moves all 4 extremities.  Skin: Skin is warm and dry. Capillary refill takes less than 2 seconds. No rash noted. He is not diaphoretic.  No jaundice  Psychiatric: He is agitated.  Nursing note and vitals reviewed.  ED Treatments / Results  Labs (all labs ordered are listed, but only abnormal results are displayed) Labs Reviewed  COMPREHENSIVE METABOLIC PANEL - Abnormal; Notable for the following components:      Result Value   Potassium 2.8 (*)    Chloride 96 (*)    CO2 21 (*)    Glucose, Bld 227 (*)    Creatinine, Ser 1.45 (*)    Calcium 8.8 (*)    Albumin 3.3 (*)    Total Bilirubin 1.9 (*)    GFR calc non Af Amer 46 (*)    GFR calc Af Amer 53 (*)    Anion gap 18 (*)    All other components within normal limits  CBC WITH DIFFERENTIAL/PLATELET - Abnormal; Notable for the following components:   WBC 14.0 (*)    RBC 3.71 (*)    Hemoglobin 10.2 (*)    HCT 33.0 (*)    RDW 15.9 (*)    Neutro Abs 13.2 (*)    Lymphs Abs 0.1 (*)    Abs Immature Granulocytes 0.18 (*)    All other components within normal limits  URINALYSIS, ROUTINE W REFLEX MICROSCOPIC - Abnormal; Notable for the following components:   Color, Urine AMBER (*)    APPearance CLOUDY (*)    Glucose, UA 50 (*)    Hgb urine dipstick LARGE (*)    Ketones, ur 20 (*)    Protein, ur 100 (*)    Bacteria, UA RARE (*)    All other components within normal limits  TROPONIN I - Abnormal; Notable for the following components:   Troponin I 0.14 (*)    All other components within normal limits  CBG MONITORING, ED - Abnormal; Notable for the following components:   Glucose-Capillary 197 (*)    All other components within normal limits  I-STAT CG4 LACTIC ACID, ED - Abnormal; Notable for the following components:   Lactic Acid, Venous 2.50 (*)  All other components within normal limits  I-STAT VENOUS BLOOD GAS,  ED - Abnormal; Notable for the following components:   pCO2, Ven 41.8 (*)    pO2, Ven 25.0 (*)    All other components within normal limits  I-STAT CG4 LACTIC ACID, ED - Abnormal; Notable for the following components:   Lactic Acid, Venous 3.12 (*)    All other components within normal limits  CULTURE, BLOOD (ROUTINE X 2)  CULTURE, BLOOD (ROUTINE X 2)  URINE CULTURE  MAGNESIUM  PROTIME-INR  INFLUENZA PANEL BY PCR (TYPE A & B)  COMPREHENSIVE METABOLIC PANEL  CBC  APTT  LACTIC ACID, PLASMA  LACTIC ACID, PLASMA  PROCALCITONIN  HEMOGLOBIN A1C  LIPID PANEL  TROPONIN I  TROPONIN I  TROPONIN I    EKG None  Radiology Dg Chest 2 View  Result Date: 12/28/2017 CLINICAL DATA:  Altered mental status EXAM: CHEST - 2 VIEW COMPARISON:  07/06/2017 FINDINGS: Post sternotomy changes and valve prosthesis. Low lung volumes. Mild cardiomegaly. Aortic atherosclerosis. No pneumothorax. IMPRESSION: No active cardiopulmonary disease.  Low lung volumes. Electronically Signed   By: Jasmine Pang M.D.   On: 12/28/2017 23:18   Ct Abdomen Pelvis W Contrast  Result Date: 12/29/2017 CLINICAL DATA:  Patient found down. Fever and suspected abscess. EXAM: CT ABDOMEN AND PELVIS WITH CONTRAST TECHNIQUE: Multidetector CT imaging of the abdomen and pelvis was performed using the standard protocol following bolus administration of intravenous contrast. CONTRAST:  OMNIPAQUE IOHEXOL 300 MG/ML  SOLN COMPARISON:  None. FINDINGS: Lower chest: Motion artifact limits examination. Atelectasis in the lung bases. Postoperative changes in the mediastinum. Cardiac enlargement. Hepatobiliary: No focal liver lesions. Gallbladder wall is thickened and edematous with some stranding in the surrounding fat. This suggest cholecystitis. No stones are visualized. No bile duct dilatation. Pancreas: Fatty infiltration of the pancreas. No focal lesion or inflammatory changes appreciated. Spleen: Normal in size without focal abnormality.  Adrenals/Urinary Tract: Adrenal glands are unremarkable. Kidneys are normal, without renal calculi, focal lesion, or hydronephrosis. Bladder is decompressed. Stomach/Bowel: Stomach, small bowel, and colon are not abnormally distended. No wall thickening or inflammatory infiltration are demonstrated. The appendix is normal. Vascular/Lymphatic: Aortic atherosclerosis. No enlarged abdominal or pelvic lymph nodes. Reproductive: Prostate is unremarkable. Other: No free air or free fluid in the abdomen or pelvis. No loculated fluid collections to suggest abscess. Abdominal wall musculature appears intact. Musculoskeletal: Degenerative changes in the lumbar spine. No destructive bone lesions. IMPRESSION: Gallbladder wall thickening and edema with surrounding infiltration suggesting acute cholecystitis. No stones are visualized. No evidence of bowel obstruction or inflammation. No abscess identified. Electronically Signed   By: Burman Nieves M.D.   On: 12/29/2017 01:26    Procedures .Critical Care Performed by: Barkley Boards, PA-C Authorized by: Barkley Boards, PA-C   Critical care provider statement:    Critical care time (minutes):  65   Critical care time was exclusive of:  Separately billable procedures and treating other patients and teaching time   Critical care was necessary to treat or prevent imminent or life-threatening deterioration of the following conditions:  Sepsis   Critical care was time spent personally by me on the following activities:  Ordering and review of laboratory studies, ordering and review of radiographic studies, re-evaluation of patient's condition, review of old charts, examination of patient, evaluation of patient's response to treatment, discussions with consultants and ordering and performing treatments and interventions   (including critical care time)  Medications Ordered in ED Medications  thiamine  500mg  in normal saline (50ml) IVPB (has no administration in time  range)  potassium chloride 10 mEq in 100 mL IVPB (has no administration in time range)  aspirin suppository 300 mg (has no administration in time range)  metoprolol tartrate (LOPRESSOR) injection 5 mg (has no administration in time range)  acetaminophen (TYLENOL) tablet 650 mg (has no administration in time range)    Or  acetaminophen (TYLENOL) suppository 650 mg (has no administration in time range)  ondansetron (ZOFRAN) tablet 4 mg (has no administration in time range)    Or  ondansetron (ZOFRAN) injection 4 mg (has no administration in time range)  sodium chloride 0.9 % bolus 2,000 mL (2,000 mLs Intravenous New Bag/Given 12/29/17 0233)  0.9 %  sodium chloride infusion (has no administration in time range)  morphine 2 MG/ML injection 1 mg (has no administration in time range)  piperacillin-tazobactam (ZOSYN) IVPB 3.375 g (has no administration in time range)  hydrALAZINE (APRESOLINE) injection 5 mg (has no administration in time range)  sodium chloride 0.9 % bolus 1,000 mL (0 mLs Intravenous Stopped 12/29/17 0056)  ceFEPIme (MAXIPIME) 2 g in sodium chloride 0.9 % 100 mL IVPB (0 g Intravenous Stopped 12/29/17 0010)  vancomycin (VANCOCIN) 1,500 mg in sodium chloride 0.9 % 500 mL IVPB ( Intravenous Restarted 12/29/17 0055)  potassium chloride 10 mEq in 100 mL IVPB (0 mEq Intravenous Stopped 12/29/17 0225)  acetaminophen (TYLENOL) suppository 975 mg (975 mg Rectal Given 12/29/17 0006)  magnesium sulfate IVPB 2 g 50 mL (0 g Intravenous Stopped 12/29/17 0225)  iohexol (OMNIPAQUE) 300 MG/ML solution 100 mL (100 mLs Intravenous Contrast Given 12/29/17 0058)     Initial Impression / Assessment and Plan / ED Course  I have reviewed the triage vital signs and the nursing notes.  Pertinent labs & imaging results that were available during my care of the patient were reviewed by me and considered in my medical decision making (see chart for details).  Clinical Course as of Dec 30 231  Thu Dec 29, 2017  0272 Repeat rectal temp is 100.8.  Patient continues to not be oriented to person, time, or place.   [MM]    Clinical Course User Index [MM] Orvella Digiulio A, PA-C    77 year old male with a h/o of critical aortic valve stenosis s/p AVR, DM Type II, dementia, lumbar DDD, AAA, and GI bleed presenting by EMS with altered mental status.  The patient was discussed and independently evaluated by Dr. Terressa Koyanagi, attending physician.    On arrival to the ED, the patient was noted to have a rectal temp of 104, tachycardia in the 100s, and mild hypertension.  Initial lactate 2.50>> 3.12 cytosis of 14 with elevated neutrophil count and left shift with bandemia.  Vacuolated neutrophils noted on smear.  Code sepsis was initiated and the patient was empirically started on vancomycin and cefepime dosed by pharmacy for sepsis of unknown etiology.  Tylenol suppository given for fever.  On exam, the patient had a strong smell of malodorous urine.  He was also noted to be diffusely tender to palpation throughout the abdomen without focality.  Appears agitated and is alert, but not oriented.  Reviewed the patient's chart and contacted the emergency contact, but was told it was a wrong number.   Labs are notable for hypokalemia 2.8 treated in the ED with 20 mEq of potassium chloride IV as the patient was not tolerating p.o. Glucose of 227 with AG of 18. VBG with pH of 7.363.  Patient was treated with 1 L of normal saline and normal saline and D5 with thiamine was initiated to cover for metabolic abnormalities.   UA with mild glucosuria, large hemoglobinuria and amorphous crystals.  Given the patient's rectal temp of 104 with concern for urinary source and abdominal tenderness, CT abdomen pelvis was ordered which demonstrated acute cholecystitis.  Given the patient's age, altered mental status, elevated total bilirubin to 1.9, consulted general surgery and spoke with Dr. Corliss Skains given concern for acute  cholecystitis versus cholangitis.  Dr. Corliss Skains felt the patient's presentation was more consistent with cholecystitis and recommended coverage with antibiotic and fluid resuscitation.  General surgery will evaluate the patient in the morning.    Also recommended cardiology evaluation to determine if the patient was a surgical candidate.  EKG with right bundle branch block and atrial fibrillation.  Given patient's altered mental status, troponin was ordered, which was elevated at 0.14, which is felt to be secondary to demand.  Consult to the hospitalist team and spoke with Dr. Clyde Lundborg as this patient requires admission for further work-up and evaluation as he is a critically ill.  Final Clinical Impressions(s) / ED Diagnoses   Final diagnoses:  Acute cholecystitis    ED Discharge Orders    None       Margarita Bobrowski A, PA-C 12/29/17 0230    Jacinta Penalver A, PA-C 12/29/17 0233    Palumbo, April, MD 12/29/17 1610

## 2017-12-28 NOTE — ED Triage Notes (Signed)
Per EMS, pt coming from a extended stay hotel off of [address]. EMS was called out by security who had to use forced entry to enter pts room. Pt was found in bed and covered in urine. Bystander at hotel told [employer] that the pt is normally alert and oriented and walks his dog everyday. Pt is a&o x1.

## 2017-12-29 ENCOUNTER — Inpatient Hospital Stay (HOSPITAL_COMMUNITY): Payer: Self-pay

## 2017-12-29 ENCOUNTER — Inpatient Hospital Stay (HOSPITAL_COMMUNITY): Payer: Medicare Other

## 2017-12-29 ENCOUNTER — Emergency Department (HOSPITAL_COMMUNITY): Payer: Medicare Other

## 2017-12-29 ENCOUNTER — Encounter (HOSPITAL_COMMUNITY): Payer: Self-pay | Admitting: General Surgery

## 2017-12-29 DIAGNOSIS — G9341 Metabolic encephalopathy: Secondary | ICD-10-CM | POA: Diagnosis not present

## 2017-12-29 DIAGNOSIS — T826XXA Infection and inflammatory reaction due to cardiac valve prosthesis, initial encounter: Secondary | ICD-10-CM | POA: Diagnosis present

## 2017-12-29 DIAGNOSIS — I76 Septic arterial embolism: Secondary | ICD-10-CM | POA: Diagnosis present

## 2017-12-29 DIAGNOSIS — Z954 Presence of other heart-valve replacement: Secondary | ICD-10-CM

## 2017-12-29 DIAGNOSIS — I669 Occlusion and stenosis of unspecified cerebral artery: Secondary | ICD-10-CM | POA: Diagnosis not present

## 2017-12-29 DIAGNOSIS — E871 Hypo-osmolality and hyponatremia: Secondary | ICD-10-CM | POA: Diagnosis present

## 2017-12-29 DIAGNOSIS — I5032 Chronic diastolic (congestive) heart failure: Secondary | ICD-10-CM | POA: Diagnosis not present

## 2017-12-29 DIAGNOSIS — K810 Acute cholecystitis: Secondary | ICD-10-CM

## 2017-12-29 DIAGNOSIS — N183 Chronic kidney disease, stage 3 unspecified: Secondary | ICD-10-CM | POA: Diagnosis present

## 2017-12-29 DIAGNOSIS — I350 Nonrheumatic aortic (valve) stenosis: Secondary | ICD-10-CM

## 2017-12-29 DIAGNOSIS — Z952 Presence of prosthetic heart valve: Secondary | ICD-10-CM | POA: Diagnosis not present

## 2017-12-29 DIAGNOSIS — Z833 Family history of diabetes mellitus: Secondary | ICD-10-CM

## 2017-12-29 DIAGNOSIS — I130 Hypertensive heart and chronic kidney disease with heart failure and stage 1 through stage 4 chronic kidney disease, or unspecified chronic kidney disease: Secondary | ICD-10-CM | POA: Diagnosis present

## 2017-12-29 DIAGNOSIS — R1084 Generalized abdominal pain: Secondary | ICD-10-CM | POA: Diagnosis not present

## 2017-12-29 DIAGNOSIS — R6520 Severe sepsis without septic shock: Secondary | ICD-10-CM

## 2017-12-29 DIAGNOSIS — I351 Nonrheumatic aortic (valve) insufficiency: Secondary | ICD-10-CM | POA: Diagnosis not present

## 2017-12-29 DIAGNOSIS — I359 Nonrheumatic aortic valve disorder, unspecified: Secondary | ICD-10-CM | POA: Diagnosis not present

## 2017-12-29 DIAGNOSIS — G9340 Encephalopathy, unspecified: Secondary | ICD-10-CM | POA: Diagnosis not present

## 2017-12-29 DIAGNOSIS — R4182 Altered mental status, unspecified: Secondary | ICD-10-CM | POA: Diagnosis present

## 2017-12-29 DIAGNOSIS — F4324 Adjustment disorder with disturbance of conduct: Secondary | ICD-10-CM

## 2017-12-29 DIAGNOSIS — Z8249 Family history of ischemic heart disease and other diseases of the circulatory system: Secondary | ICD-10-CM

## 2017-12-29 DIAGNOSIS — F0390 Unspecified dementia without behavioral disturbance: Secondary | ICD-10-CM

## 2017-12-29 DIAGNOSIS — F0150 Vascular dementia without behavioral disturbance: Secondary | ICD-10-CM | POA: Diagnosis not present

## 2017-12-29 DIAGNOSIS — A419 Sepsis, unspecified organism: Secondary | ICD-10-CM | POA: Diagnosis present

## 2017-12-29 DIAGNOSIS — R5383 Other fatigue: Secondary | ICD-10-CM | POA: Diagnosis not present

## 2017-12-29 DIAGNOSIS — I330 Acute and subacute infective endocarditis: Secondary | ICD-10-CM | POA: Diagnosis present

## 2017-12-29 DIAGNOSIS — D696 Thrombocytopenia, unspecified: Secondary | ICD-10-CM | POA: Diagnosis present

## 2017-12-29 DIAGNOSIS — B9562 Methicillin resistant Staphylococcus aureus infection as the cause of diseases classified elsewhere: Secondary | ICD-10-CM | POA: Diagnosis not present

## 2017-12-29 DIAGNOSIS — I482 Chronic atrial fibrillation, unspecified: Secondary | ICD-10-CM

## 2017-12-29 DIAGNOSIS — R6521 Severe sepsis with septic shock: Secondary | ICD-10-CM | POA: Diagnosis present

## 2017-12-29 DIAGNOSIS — R509 Fever, unspecified: Secondary | ICD-10-CM | POA: Diagnosis not present

## 2017-12-29 DIAGNOSIS — E872 Acidosis: Secondary | ICD-10-CM | POA: Diagnosis not present

## 2017-12-29 DIAGNOSIS — Z66 Do not resuscitate: Secondary | ICD-10-CM | POA: Diagnosis not present

## 2017-12-29 DIAGNOSIS — R7989 Other specified abnormal findings of blood chemistry: Secondary | ICD-10-CM

## 2017-12-29 DIAGNOSIS — E876 Hypokalemia: Secondary | ICD-10-CM | POA: Diagnosis not present

## 2017-12-29 DIAGNOSIS — R011 Cardiac murmur, unspecified: Secondary | ICD-10-CM

## 2017-12-29 DIAGNOSIS — R7881 Bacteremia: Secondary | ICD-10-CM | POA: Diagnosis not present

## 2017-12-29 DIAGNOSIS — A4102 Sepsis due to Methicillin resistant Staphylococcus aureus: Secondary | ICD-10-CM | POA: Diagnosis present

## 2017-12-29 DIAGNOSIS — K819 Cholecystitis, unspecified: Secondary | ICD-10-CM

## 2017-12-29 DIAGNOSIS — M79605 Pain in left leg: Secondary | ICD-10-CM | POA: Diagnosis not present

## 2017-12-29 DIAGNOSIS — D649 Anemia, unspecified: Secondary | ICD-10-CM

## 2017-12-29 DIAGNOSIS — M791 Myalgia, unspecified site: Secondary | ICD-10-CM | POA: Diagnosis not present

## 2017-12-29 DIAGNOSIS — I6340 Cerebral infarction due to embolism of unspecified cerebral artery: Secondary | ICD-10-CM | POA: Diagnosis present

## 2017-12-29 DIAGNOSIS — N179 Acute kidney failure, unspecified: Secondary | ICD-10-CM | POA: Diagnosis present

## 2017-12-29 DIAGNOSIS — E1122 Type 2 diabetes mellitus with diabetic chronic kidney disease: Secondary | ICD-10-CM

## 2017-12-29 DIAGNOSIS — I6349 Cerebral infarction due to embolism of other cerebral artery: Secondary | ICD-10-CM | POA: Diagnosis present

## 2017-12-29 DIAGNOSIS — R710 Precipitous drop in hematocrit: Secondary | ICD-10-CM | POA: Diagnosis not present

## 2017-12-29 DIAGNOSIS — M79604 Pain in right leg: Secondary | ICD-10-CM | POA: Diagnosis not present

## 2017-12-29 DIAGNOSIS — R627 Adult failure to thrive: Secondary | ICD-10-CM | POA: Diagnosis present

## 2017-12-29 DIAGNOSIS — G92 Toxic encephalopathy: Secondary | ICD-10-CM | POA: Diagnosis present

## 2017-12-29 DIAGNOSIS — I358 Other nonrheumatic aortic valve disorders: Secondary | ICD-10-CM | POA: Diagnosis not present

## 2017-12-29 DIAGNOSIS — E870 Hyperosmolality and hypernatremia: Secondary | ICD-10-CM | POA: Diagnosis not present

## 2017-12-29 DIAGNOSIS — I248 Other forms of acute ischemic heart disease: Secondary | ICD-10-CM | POA: Diagnosis present

## 2017-12-29 DIAGNOSIS — R778 Other specified abnormalities of plasma proteins: Secondary | ICD-10-CM | POA: Diagnosis present

## 2017-12-29 DIAGNOSIS — I10 Essential (primary) hypertension: Secondary | ICD-10-CM

## 2017-12-29 DIAGNOSIS — N400 Enlarged prostate without lower urinary tract symptoms: Secondary | ICD-10-CM | POA: Diagnosis present

## 2017-12-29 DIAGNOSIS — E1140 Type 2 diabetes mellitus with diabetic neuropathy, unspecified: Secondary | ICD-10-CM | POA: Diagnosis present

## 2017-12-29 DIAGNOSIS — M519 Unspecified thoracic, thoracolumbar and lumbosacral intervertebral disc disorder: Secondary | ICD-10-CM

## 2017-12-29 DIAGNOSIS — Y712 Prosthetic and other implants, materials and accessory cardiovascular devices associated with adverse incidents: Secondary | ICD-10-CM | POA: Diagnosis present

## 2017-12-29 DIAGNOSIS — G939 Disorder of brain, unspecified: Secondary | ICD-10-CM

## 2017-12-29 DIAGNOSIS — Z515 Encounter for palliative care: Secondary | ICD-10-CM | POA: Diagnosis not present

## 2017-12-29 DIAGNOSIS — E43 Unspecified severe protein-calorie malnutrition: Secondary | ICD-10-CM | POA: Diagnosis present

## 2017-12-29 LAB — COMPREHENSIVE METABOLIC PANEL
ALBUMIN: 2.7 g/dL — AB (ref 3.5–5.0)
ALK PHOS: 33 U/L — AB (ref 38–126)
ALT: 12 U/L (ref 0–44)
ALT: 15 U/L (ref 0–44)
AST: 24 U/L (ref 15–41)
AST: 34 U/L (ref 15–41)
Albumin: 2.7 g/dL — ABNORMAL LOW (ref 3.5–5.0)
Alkaline Phosphatase: 35 U/L — ABNORMAL LOW (ref 38–126)
Anion gap: 16 — ABNORMAL HIGH (ref 5–15)
Anion gap: 12 (ref 5–15)
BUN: 21 mg/dL (ref 8–23)
BUN: 23 mg/dL (ref 8–23)
CALCIUM: 7.7 mg/dL — AB (ref 8.9–10.3)
CHLORIDE: 106 mmol/L (ref 98–111)
CHLORIDE: 107 mmol/L (ref 98–111)
CO2: 15 mmol/L — AB (ref 22–32)
CO2: 17 mmol/L — ABNORMAL LOW (ref 22–32)
CREATININE: 1.4 mg/dL — AB (ref 0.61–1.24)
CREATININE: 1.48 mg/dL — AB (ref 0.61–1.24)
Calcium: 7.5 mg/dL — ABNORMAL LOW (ref 8.9–10.3)
GFR calc Af Amer: 55 mL/min — ABNORMAL LOW (ref >60)
GFR calc non Af Amer: 48 mL/min — ABNORMAL LOW (ref >60)
GFR, EST AFRICAN AMERICAN: 52 mL/min — AB (ref >60)
GFR, EST NON AFRICAN AMERICAN: 45 mL/min — AB (ref >60)
GLUCOSE: 220 mg/dL — AB (ref 70–99)
Glucose, Bld: 255 mg/dL — ABNORMAL HIGH (ref 70–99)
POTASSIUM: 3.5 mmol/L (ref 3.5–5.1)
Potassium: 2.8 mmol/L — ABNORMAL LOW (ref 3.5–5.1)
SODIUM: 137 mmol/L (ref 135–145)
Sodium: 136 mmol/L (ref 135–145)
TOTAL PROTEIN: 6.1 g/dL — AB (ref 6.5–8.1)
Total Bilirubin: 2 mg/dL — ABNORMAL HIGH (ref 0.3–1.2)
Total Bilirubin: 1.9 mg/dL — ABNORMAL HIGH (ref 0.3–1.2)
Total Protein: 5.8 g/dL — ABNORMAL LOW (ref 6.5–8.1)

## 2017-12-29 LAB — MAGNESIUM
MAGNESIUM: 1.6 mg/dL — AB (ref 1.7–2.4)
Magnesium: 1.2 mg/dL — ABNORMAL LOW (ref 1.7–2.4)

## 2017-12-29 LAB — CBC
HCT: 28.1 % — ABNORMAL LOW (ref 39.0–52.0)
HEMATOCRIT: 30.6 % — AB (ref 39.0–52.0)
Hemoglobin: 8.5 g/dL — ABNORMAL LOW (ref 13.0–17.0)
Hemoglobin: 9 g/dL — ABNORMAL LOW (ref 13.0–17.0)
MCH: 26.9 pg (ref 26.0–34.0)
MCH: 26.6 pg (ref 26.0–34.0)
MCHC: 30.2 g/dL (ref 30.0–36.0)
MCHC: 29.4 g/dL — ABNORMAL LOW (ref 30.0–36.0)
MCV: 88.9 fL (ref 80.0–100.0)
MCV: 90.5 fL (ref 80.0–100.0)
NRBC: 0 % (ref 0.0–0.2)
Platelets: 120 10*3/uL — ABNORMAL LOW (ref 150–400)
Platelets: 95 10*3/uL — ABNORMAL LOW (ref 150–400)
RBC: 3.16 MIL/uL — AB (ref 4.22–5.81)
RBC: 3.38 MIL/uL — ABNORMAL LOW (ref 4.22–5.81)
RDW: 15.9 % — AB (ref 11.5–15.5)
RDW: 16.2 % — ABNORMAL HIGH (ref 11.5–15.5)
WBC: 10.7 10*3/uL — ABNORMAL HIGH (ref 4.0–10.5)
WBC: 13.7 10*3/uL — AB (ref 4.0–10.5)
nRBC: 0 % (ref 0.0–0.2)

## 2017-12-29 LAB — LIPID PANEL
Cholesterol: 127 mg/dL (ref 0–200)
HDL: 27 mg/dL — AB (ref >40)
LDL CALC: 83 mg/dL (ref 0–99)
TRIGLYCERIDES: 87 mg/dL (ref <150)
Total CHOL/HDL Ratio: 4.7 RATIO
VLDL: 17 mg/dL (ref 0–40)

## 2017-12-29 LAB — BLOOD CULTURE ID PANEL (REFLEXED)
Acinetobacter baumannii: NOT DETECTED
CANDIDA ALBICANS: NOT DETECTED
CANDIDA GLABRATA: NOT DETECTED
CANDIDA KRUSEI: NOT DETECTED
Candida parapsilosis: NOT DETECTED
Candida tropicalis: NOT DETECTED
ENTEROBACTER CLOACAE COMPLEX: NOT DETECTED
ENTEROBACTERIACEAE SPECIES: NOT DETECTED
ENTEROCOCCUS SPECIES: NOT DETECTED
ESCHERICHIA COLI: NOT DETECTED
Haemophilus influenzae: NOT DETECTED
KLEBSIELLA PNEUMONIAE: NOT DETECTED
Klebsiella oxytoca: NOT DETECTED
LISTERIA MONOCYTOGENES: NOT DETECTED
Methicillin resistance: DETECTED — AB
Neisseria meningitidis: NOT DETECTED
PROTEUS SPECIES: NOT DETECTED
Pseudomonas aeruginosa: NOT DETECTED
STAPHYLOCOCCUS SPECIES: DETECTED — AB
STREPTOCOCCUS PNEUMONIAE: NOT DETECTED
STREPTOCOCCUS PYOGENES: NOT DETECTED
Serratia marcescens: NOT DETECTED
Staphylococcus aureus (BCID): DETECTED — AB
Streptococcus agalactiae: NOT DETECTED
Streptococcus species: NOT DETECTED

## 2017-12-29 LAB — URINALYSIS, ROUTINE W REFLEX MICROSCOPIC
Bilirubin Urine: NEGATIVE
Glucose, UA: 50 mg/dL — AB
KETONES UR: 20 mg/dL — AB
LEUKOCYTES UA: NEGATIVE
NITRITE: NEGATIVE
PH: 5 (ref 5.0–8.0)
Protein, ur: 100 mg/dL — AB
Specific Gravity, Urine: 1.021 (ref 1.005–1.030)

## 2017-12-29 LAB — INFLUENZA PANEL BY PCR (TYPE A & B)
Influenza A By PCR: NEGATIVE
Influenza B By PCR: NEGATIVE

## 2017-12-29 LAB — GLUCOSE, CAPILLARY
GLUCOSE-CAPILLARY: 168 mg/dL — AB (ref 70–99)
GLUCOSE-CAPILLARY: 132 mg/dL — AB (ref 70–99)

## 2017-12-29 LAB — RAPID URINE DRUG SCREEN, HOSP PERFORMED
Amphetamines: NOT DETECTED
BARBITURATES: NOT DETECTED
BENZODIAZEPINES: NOT DETECTED
COCAINE: NOT DETECTED
Opiates: NOT DETECTED
TETRAHYDROCANNABINOL: NOT DETECTED

## 2017-12-29 LAB — I-STAT CG4 LACTIC ACID, ED: Lactic Acid, Venous: 3.12 mmol/L (ref 0.5–1.9)

## 2017-12-29 LAB — TROPONIN I
TROPONIN I: 0.14 ng/mL — AB (ref <0.03)
TROPONIN I: 0.26 ng/mL — AB (ref <0.03)
TROPONIN I: 0.24 ng/mL — AB (ref <0.03)
Troponin I: 0.18 ng/mL (ref <0.03)

## 2017-12-29 LAB — LACTIC ACID, PLASMA
LACTIC ACID, VENOUS: 2.1 mmol/L — AB (ref 0.5–1.9)
Lactic Acid, Venous: 2.3 mmol/L (ref 0.5–1.9)
Lactic Acid, Venous: 1.4 mmol/L (ref 0.5–1.9)

## 2017-12-29 LAB — PROTIME-INR
INR: 1.59
Prothrombin Time: 18.7 seconds — ABNORMAL HIGH (ref 11.4–15.2)

## 2017-12-29 LAB — BRAIN NATRIURETIC PEPTIDE: B Natriuretic Peptide: 1059 pg/mL — ABNORMAL HIGH (ref 0.0–100.0)

## 2017-12-29 LAB — HEMOGLOBIN A1C
Hgb A1c MFr Bld: 7.1 % — ABNORMAL HIGH (ref 4.8–5.6)
Mean Plasma Glucose: 157.07 mg/dL

## 2017-12-29 LAB — AMMONIA: AMMONIA: 14 umol/L (ref 9–35)

## 2017-12-29 LAB — CBG MONITORING, ED: GLUCOSE-CAPILLARY: 241 mg/dL — AB (ref 70–99)

## 2017-12-29 LAB — TYPE AND SCREEN
ABO/RH(D): O POS
Antibody Screen: NEGATIVE

## 2017-12-29 LAB — APTT: APTT: 52 s — AB (ref 24–36)

## 2017-12-29 LAB — PROCALCITONIN: Procalcitonin: 3.8 ng/mL

## 2017-12-29 MED ORDER — SODIUM CHLORIDE 0.9 % IV SOLN
INTRAVENOUS | Status: DC
  Administered 2017-12-29: 05:00:00 via INTRAVENOUS

## 2017-12-29 MED ORDER — ADULT MULTIVITAMIN W/MINERALS CH
1.0000 | ORAL_TABLET | Freq: Every day | ORAL | Status: DC
  Administered 2018-01-01 – 2018-01-05 (×4): 1 via ORAL
  Filled 2017-12-29 (×6): qty 1

## 2017-12-29 MED ORDER — ONDANSETRON HCL 4 MG/2ML IJ SOLN: 4.0000 mg | Freq: Four times a day (QID) | INTRAMUSCULAR | Status: DC | PRN

## 2017-12-29 MED ORDER — ACETAMINOPHEN 650 MG RE SUPP
650.0000 mg | Freq: Four times a day (QID) | RECTAL | Status: DC | PRN
  Administered 2017-12-30 – 2018-01-02 (×4): 650 mg via RECTAL
  Filled 2017-12-29 (×4): qty 1

## 2017-12-29 MED ORDER — METRONIDAZOLE IN NACL 5-0.79 MG/ML-% IV SOLN
500.0000 mg | Freq: Once | INTRAVENOUS | Status: DC
  Filled 2017-12-29: qty 100

## 2017-12-29 MED ORDER — IOHEXOL 300 MG/ML  SOLN
100.0000 mL | Freq: Once | INTRAMUSCULAR | Status: AC | PRN
  Administered 2017-12-29: 100 mL via INTRAVENOUS

## 2017-12-29 MED ORDER — ASPIRIN 300 MG RE SUPP
300.0000 mg | Freq: Every day | RECTAL | Status: DC
  Administered 2017-12-30 – 2018-01-01 (×3): 300 mg via RECTAL
  Filled 2017-12-29 (×5): qty 1

## 2017-12-29 MED ORDER — INSULIN ASPART 100 UNIT/ML ~~LOC~~ SOLN
0.0000 [IU] | Freq: Three times a day (TID) | SUBCUTANEOUS | Status: DC
  Administered 2017-12-29: 3 [IU] via SUBCUTANEOUS
  Filled 2017-12-29: qty 1

## 2017-12-29 MED ORDER — TECHNETIUM TC 99M MEBROFENIN IV KIT
5.0000 | PACK | Freq: Once | INTRAVENOUS | Status: AC | PRN
  Administered 2017-12-29: 5 via INTRAVENOUS

## 2017-12-29 MED ORDER — DEXTROSE-NACL 5-0.9 % IV SOLN: INTRAVENOUS | Status: DC

## 2017-12-29 MED ORDER — SODIUM CHLORIDE 0.9 % IV SOLN
INTRAVENOUS | Status: DC
  Filled 2017-12-29: qty 1000

## 2017-12-29 MED ORDER — POTASSIUM CHLORIDE IN NACL 40-0.9 MEQ/L-% IV SOLN
INTRAVENOUS | Status: DC
  Administered 2017-12-29: 75 mL/h via INTRAVENOUS
  Filled 2017-12-29: qty 1000

## 2017-12-29 MED ORDER — INSULIN ASPART 100 UNIT/ML ~~LOC~~ SOLN
0.0000 [IU] | SUBCUTANEOUS | Status: DC
  Administered 2017-12-29: 1 [IU] via SUBCUTANEOUS
  Administered 2017-12-30: 2 [IU] via SUBCUTANEOUS
  Administered 2017-12-30 (×2): 1 [IU] via SUBCUTANEOUS
  Administered 2017-12-30: 2 [IU] via SUBCUTANEOUS
  Administered 2017-12-30: 1 [IU] via SUBCUTANEOUS
  Administered 2017-12-30: 2 [IU] via SUBCUTANEOUS
  Administered 2017-12-31: 1 [IU] via SUBCUTANEOUS
  Administered 2017-12-31 (×2): 2 [IU] via SUBCUTANEOUS
  Administered 2017-12-31: 3 [IU] via SUBCUTANEOUS
  Administered 2017-12-31 (×2): 2 [IU] via SUBCUTANEOUS
  Administered 2018-01-01 (×3): 3 [IU] via SUBCUTANEOUS
  Administered 2018-01-01: 2 [IU] via SUBCUTANEOUS
  Administered 2018-01-01 (×2): 3 [IU] via SUBCUTANEOUS
  Administered 2018-01-02: 2 [IU] via SUBCUTANEOUS
  Administered 2018-01-02: 1 [IU] via SUBCUTANEOUS

## 2017-12-29 MED ORDER — ACETAMINOPHEN 650 MG RE SUPP
975.0000 mg | Freq: Once | RECTAL | Status: AC
  Administered 2017-12-29: 975 mg via RECTAL
  Filled 2017-12-29: qty 1

## 2017-12-29 MED ORDER — SODIUM CHLORIDE 0.9 % IV BOLUS
2000.0000 mL | Freq: Once | INTRAVENOUS | Status: AC
  Administered 2017-12-29: 2000 mL via INTRAVENOUS

## 2017-12-29 MED ORDER — HALOPERIDOL LACTATE 5 MG/ML IJ SOLN
4.0000 mg | Freq: Once | INTRAMUSCULAR | Status: AC
  Administered 2017-12-29: 4 mg via INTRAVENOUS
  Filled 2017-12-29: qty 1

## 2017-12-29 MED ORDER — POTASSIUM CHLORIDE IN NACL 40-0.9 MEQ/L-% IV SOLN
INTRAVENOUS | Status: DC
  Administered 2017-12-30: 75 mL/h via INTRAVENOUS
  Filled 2017-12-29 (×2): qty 1000

## 2017-12-29 MED ORDER — METOPROLOL TARTRATE 5 MG/5ML IV SOLN
5.0000 mg | Freq: Three times a day (TID) | INTRAVENOUS | Status: DC
  Administered 2017-12-29 – 2018-01-02 (×13): 5 mg via INTRAVENOUS
  Filled 2017-12-29 (×12): qty 5

## 2017-12-29 MED ORDER — PIPERACILLIN-TAZOBACTAM 3.375 G IVPB
3.3750 g | Freq: Three times a day (TID) | INTRAVENOUS | Status: DC
  Administered 2017-12-29 (×2): 3.375 g via INTRAVENOUS
  Filled 2017-12-29 (×2): qty 50

## 2017-12-29 MED ORDER — THIAMINE HCL 100 MG/ML IJ SOLN
100.0000 mg | Freq: Every day | INTRAMUSCULAR | Status: DC
  Administered 2017-12-30 – 2018-01-05 (×7): 100 mg via INTRAVENOUS
  Filled 2017-12-29 (×8): qty 2

## 2017-12-29 MED ORDER — VANCOMYCIN HCL 10 G IV SOLR
1250.0000 mg | INTRAVENOUS | Status: AC
  Administered 2017-12-29 – 2018-01-01 (×4): 1250 mg via INTRAVENOUS
  Filled 2017-12-29 (×4): qty 1250

## 2017-12-29 MED ORDER — ACETAMINOPHEN 325 MG PO TABS
650.0000 mg | ORAL_TABLET | Freq: Four times a day (QID) | ORAL | Status: DC | PRN
  Administered 2018-01-05 (×2): 650 mg via ORAL
  Filled 2017-12-29 (×3): qty 2

## 2017-12-29 MED ORDER — MAGNESIUM SULFATE 2 GM/50ML IV SOLN
2.0000 g | Freq: Once | INTRAVENOUS | Status: AC
  Administered 2017-12-29: 2 g via INTRAVENOUS
  Filled 2017-12-29: qty 50

## 2017-12-29 MED ORDER — ONDANSETRON HCL 4 MG PO TABS: 4.0000 mg | ORAL_TABLET | Freq: Four times a day (QID) | ORAL | Status: DC | PRN

## 2017-12-29 MED ORDER — THIAMINE HCL 100 MG/ML IJ SOLN: 100.0000 mg | Freq: Every day | INTRAMUSCULAR | Status: DC

## 2017-12-29 MED ORDER — POTASSIUM CHLORIDE 10 MEQ/100ML IV SOLN
10.0000 meq | INTRAVENOUS | Status: AC
  Administered 2017-12-29 (×3): 10 meq via INTRAVENOUS
  Filled 2017-12-29 (×3): qty 100

## 2017-12-29 MED ORDER — INSULIN ASPART 100 UNIT/ML ~~LOC~~ SOLN: 0.0000 [IU] | Freq: Every day | SUBCUTANEOUS | Status: DC

## 2017-12-29 MED ORDER — MORPHINE SULFATE (PF) 2 MG/ML IV SOLN
1.0000 mg | INTRAVENOUS | Status: DC | PRN
  Filled 2017-12-29: qty 1

## 2017-12-29 MED ORDER — HYDRALAZINE HCL 20 MG/ML IJ SOLN
5.0000 mg | INTRAMUSCULAR | Status: DC | PRN
  Administered 2017-12-31: 5 mg via INTRAVENOUS
  Filled 2017-12-29: qty 1

## 2017-12-29 MED ORDER — LORAZEPAM 2 MG/ML IJ SOLN
2.0000 mg | INTRAMUSCULAR | Status: DC | PRN
  Administered 2017-12-29 (×4): 2 mg via INTRAVENOUS
  Administered 2017-12-29: 3 mg via INTRAVENOUS
  Administered 2017-12-29 (×2): 2 mg via INTRAVENOUS
  Filled 2017-12-29: qty 2
  Filled 2017-12-29 (×6): qty 1

## 2017-12-29 MED ORDER — FOLIC ACID 1 MG PO TABS
1.0000 mg | ORAL_TABLET | Freq: Every day | ORAL | Status: DC
  Administered 2018-01-01 – 2018-01-05 (×4): 1 mg via ORAL
  Filled 2017-12-29 (×6): qty 1

## 2017-12-29 MED ORDER — THIAMINE HCL 100 MG/ML IJ SOLN
500.0000 mg | Freq: Once | INTRAVENOUS | Status: AC
  Administered 2017-12-29: 500 mg via INTRAVENOUS
  Filled 2017-12-29: qty 5

## 2017-12-29 MED ORDER — THIAMINE HCL 100 MG/ML IJ SOLN
500.0000 mg | Freq: Once | INTRAMUSCULAR | Status: DC
  Filled 2017-12-29: qty 6

## 2017-12-29 NOTE — ED Notes (Signed)
Pt severely agitated and ripping off tele leads and attempting to remove Foley catheter. Pt screaming out help me help me. RN informed MD

## 2017-12-29 NOTE — Progress Notes (Signed)
Patient arrived to 5W16 via gurney; accompanied by two attendants and a Recruitment consultant.  He was hollering and belligerent.  Not able to follow commands at this time.  This behavior started once stimulated by transfer to bed.  Calmed after 5 minutes of low stimulus environment.  Informed that he had been given second dose of 2 mg. Ativan just prior to transfer to the floor.  IV Zosyn and Vancomycin given.  Transferred to MRI for stat treatment.  BLE appear mottled and are cool to touch, but pulses are dopplarable bilaterally; left stronger than right dorsalis pedis.  Safety mitts on to prevent removal of lines.

## 2017-12-29 NOTE — Progress Notes (Signed)
Pharmacy Antibiotic Note  Peter Weiss is a 77 y.o. male admitted on 12/28/2017 with sepsis.  Pharmacy has been consulted for zosyn dosing. Vancomycin and cefepime given in ED  Plan: Zosyn 3.375g IV q8h (4 hour infusion).  F/u cultures and clinical course  Height: 6\' 1"  (185.4 cm) Weight: 252 lb 10.4 oz (114.6 kg) IBW/kg (Calculated) : 79.9  Temp (24hrs), Avg:102.1 F (38.9 C), Min:100.6 F (38.1 C), Max:104 F (40 C)  Recent Labs  Lab 12/28/17 2232 12/28/17 2242 12/29/17 0004  WBC 14.0*  --   --   CREATININE 1.45*  --   --   LATICACIDVEN  --  2.50* 3.12*    Estimated Creatinine Clearance: 58.4 mL/min (A) (by C-G formula based on SCr of 1.45 mg/dL (H)).    No Known Allergies  Antimicrobials this admission: vanc x 1 10/16 Cefepime x 1 10/16 Zosyn  10/17>>   Thank you for allowing pharmacy to be a part of this patient's care.  Talbert Cage Poteet 12/29/2017 2:33 AM

## 2017-12-29 NOTE — Progress Notes (Signed)
Patient underwent HIDA scan today for further evaluation of possible acute cholecystitis.  NM study shows normal visualization of the gall bladder.  No percutaneous drainage planned in IR at this time.   Surgical team made aware.   Loyce Dys, MS RD PA-C 3:26 PM

## 2017-12-29 NOTE — ED Notes (Signed)
Pt trying to get out of bed repeatedly. Pt reoriented and asked multiple times to stay in bed. Pt continues to yell out. Dr. Waymon Amato text paged to call RN. Dr. Waymon Amato at bedside at this time.

## 2017-12-29 NOTE — ED Notes (Signed)
Bed assigned

## 2017-12-29 NOTE — Consult Note (Signed)
Peter Weiss 1941-01-21  [id number].    Requesting MD: Dr. Ivor Costa Chief Complaint/Reason for Consult: cholecystitis  HPI:  This is a 77 yo white male with a complex medical history including AS, s/p valve replacement with a Bovine valve, HTN, CHF, DM, ? Dementia, who was brought to the ED via EMS from an extended stay motel secondary to be found down and having voided on himself.  The patient is severely agitate and screaming and pulling at things.  He is completely disoriented and will not say anything else but "HELP ME!"  He has apparently had a CT scan that shows evidence of cholecystitis.  His WBC is 10.7.  TB is 2.0.  Other labs have been reviewed as well.  We have been asked to see the patient for further recommendations.  ROS: ROS: Unable to obtain due to confusion, AMS, agitation    Family History  Problem Relation Age of Onset  . Alcohol abuse Mother   . Heart disease Mother   . Diabetes Mother   . Heart disease Father   . Diabetes Father   . Cancer Mother   . Cancer Father   . Alcohol abuse Father   . Alcohol abuse Brother     Past Medical History:  Diagnosis Date  . Aortic stenosis   . Arthritis   . CHF (congestive heart failure) ([place])   . Chicken pox   . Depression   . Diabetes mellitus without complication ([place])   . Heart murmur   . History of fainting spells of unknown cause   . Hyperlipidemia   . Hypertension   . Neuromuscular disorder ([place])   . Urinary incontinence     Past Surgical History:  Procedure Laterality Date  . AORTIC VALVE REPLACEMENT N/A 04/26/2014   Procedure: AORTIC VALVE REPLACEMENT (AVR);  Surgeon: Melrose Nakayama, MD;  Location: [facility];  Service: Open Heart Surgery;  Laterality: N/A;  . CARDIAC CATHETERIZATION  03/2014  . ESOPHAGOGASTRODUODENOSCOPY N/A 07/04/2014   Procedure: ESOPHAGOGASTRODUODENOSCOPY (EGD);  Surgeon: Carol Ada, MD;  Location: [facility] ENDOSCOPY;  Service: Endoscopy;  Laterality: N/A;  . goiter  2008    . MAZE N/A 04/26/2014   Procedure: MAZE;  Surgeon: Melrose Nakayama, MD;  Location: [facility];  Service: Open Heart Surgery;  Laterality: N/A;  . MULTIPLE EXTRACTIONS WITH ALVEOLOPLASTY N/A 04/24/2014   Procedure: Extraction of tooth #'s 2,3,5,12,13,14,15,20 with allveoloplasty and gross debridement of remaining dentition and sectioning of bridge at mesial of abutment tooth #32.;  Surgeon: Lenn Cal, DDS;  Location: [facility];  Service: Oral Surgery;  Laterality: N/A;  . TEE WITHOUT CARDIOVERSION N/A 04/26/2014   Procedure: TRANSESOPHAGEAL ECHOCARDIOGRAM (TEE);  Surgeon: Melrose Nakayama, MD;  Location: [facility];  Service: Open Heart Surgery;  Laterality: N/A;    Social History:  reports that he has never smoked. He has never used smokeless tobacco. He reports that he drinks alcohol. He reports that he has current or past drug history. Drug: Marijuana.  Allergies: No Known Allergies   (Not in a hospital admission)   Physical Exam: Blood pressure 136/86, pulse (!) 107, temperature (!) 100.6 F (38.1 C), temperature source Rectal, resp. rate 18, height 6' 1"  (1.854 m), weight 114.6 kg, SpO2 96 %. General: confused, agitate, screaming white male who is laying in bed and kicking his legs and won't stay still. HEENT: head is normocephalic, atraumatic.  Sclera are noninjected.  PERRL.  Ears and nose without any masses or  lesions.  Mouth is pink and dry with poor dentition. Heart: tachycardic, unable to hear heart sounds due to consistent screaming. No obvious murmurs, gallops, or rubs noted.  Palpable radial and pedal pulses bilaterally Lungs: CTAB, no wheezes, rhonchi, or rales noted.  Respiratory effort nonlabored between screams Abd: soft, seems tender to palpation but very difficult to tell given his status, ND, +BS, no masses, hernias, or organomegaly MS: all 4 extremities are symmetrical with no cyanosis, clubbing.  Trace pedal edema Skin: warm and dry with no masses, lesions, or  rashes Psych: AMS, agitated, screaming, unable to perform psych assessment   Results for orders placed or performed during the hospital encounter of 12/28/17 (from the past 48 hour(s))  CBG monitoring, ED     Status: Abnormal   Collection Time: 12/28/17 10:19 PM  Result Value Ref Range   Glucose-Capillary 197 (H) 70 - 99 mg/dL  Comprehensive metabolic panel     Status: Abnormal   Collection Time: 12/28/17 10:32 PM  Result Value Ref Range   Sodium 135 135 - 145 mmol/L   Potassium 2.8 (L) 3.5 - 5.1 mmol/L   Chloride 96 (L) 98 - 111 mmol/L   CO2 21 (L) 22 - 32 mmol/L   Glucose, Bld 227 (H) 70 - 99 mg/dL   BUN 20 8 - 23 mg/dL   Creatinine, Ser 1.45 (H) 0.61 - 1.24 mg/dL   Calcium 8.8 (L) 8.9 - 10.3 mg/dL   Total Protein 7.3 6.5 - 8.1 g/dL   Albumin 3.3 (L) 3.5 - 5.0 g/dL   AST 23 15 - 41 U/L   ALT 13 0 - 44 U/L   Alkaline Phosphatase 45 38 - 126 U/L   Total Bilirubin 1.9 (H) 0.3 - 1.2 mg/dL   GFR calc non Af Amer 46 (L) >60 mL/min   GFR calc Af Amer 53 (L) >60 mL/min    Comment: (NOTE) The eGFR has been calculated using the CKD EPI equation. This calculation has not been validated in all clinical situations. eGFR's persistently <60 mL/min signify possible Chronic Kidney Disease.    Anion gap 18 (H) 5 - 15    Comment: Performed at [facility], [place] [address], [place], [place] [address]  CBC WITH DIFFERENTIAL     Status: Abnormal   Collection Time: 12/28/17 10:32 PM  Result Value Ref Range   WBC 14.0 (H) 4.0 - 10.5 K/uL    Comment: REPEATED TO VERIFY WHITE COUNT CONFIRMED ON SMEAR    RBC 3.71 (L) 4.22 - 5.81 MIL/uL   Hemoglobin 10.2 (L) 13.0 - 17.0 g/dL   HCT 33.0 (L) 39.0 - 52.0 %   MCV 88.9 80.0 - 100.0 fL   MCH 27.5 26.0 - 34.0 pg   MCHC 30.9 30.0 - 36.0 g/dL   RDW 15.9 (H) 11.5 - 15.5 %   Platelets 166 150 - 400 K/uL   nRBC 0.0 0.0 - 0.2 %   Neutrophils Relative % 94 %   Neutro Abs 13.2 (H) 1.7 - 7.7 K/uL   Lymphocytes Relative 1 %   Lymphs Abs 0.1 (L) 0.7  - 4.0 K/uL   Monocytes Relative 3 %   Monocytes Absolute 0.4 0.1 - 1.0 K/uL   Eosinophils Relative 1 %   Eosinophils Absolute 0.2 0.0 - 0.5 K/uL   Basophils Relative 0 %   Basophils Absolute 0.0 0.0 - 0.1 K/uL   WBC Morphology INCREASED BANDS (>20% BANDS)     Comment: VACUOLATED NEUTROPHILS   Smear  Review HIDE    Immature Granulocytes 1 %   Abs Immature Granulocytes 0.18 (H) 0.00 - 0.07 K/uL   Polychromasia PRESENT     Comment: Performed at [facility], [place] [address], [place], [place] [address]  Blood Culture (routine x 2)     Status: None (Preliminary result)   Collection Time: 12/28/17 10:32 PM  Result Value Ref Range   Specimen Description BLOOD RIGHT ANTECUBITAL    Special Requests      BOTTLES DRAWN AEROBIC AND ANAEROBIC Blood Culture results may not be optimal due to an excessive volume of blood received in culture bottles   Culture      NO GROWTH < 12 HOURS Performed at [facility] [address], [place], [place] [address]    Report Status PENDING   I-Stat CG4 Lactic Acid, ED  (not at  [facility])     Status: Abnormal   Collection Time: 12/28/17 10:42 PM  Result Value Ref Range   Lactic Acid, Venous 2.50 (HH) 0.5 - 1.9 mmol/L   Comment NOTIFIED PHYSICIAN   Blood Culture (routine x 2)     Status: None (Preliminary result)   Collection Time: 12/28/17 11:18 PM  Result Value Ref Range   Specimen Description BLOOD RIGHT HAND    Special Requests      BOTTLES DRAWN AEROBIC AND ANAEROBIC Blood Culture results may not be optimal due to an inadequate volume of blood received in culture bottles   Culture      NO GROWTH < 12 HOURS Performed at [facility] [address] [place], [place] [address]    Report Status PENDING   Troponin I     Status: Abnormal   Collection Time: 12/28/17 11:18 PM  Result Value Ref Range   Troponin I 0.14 (HH) <0.03 ng/mL    Comment: CRITICAL RESULT CALLED TO, READ BACK BY AND VERIFIED WITH: FOUNTAIN,M RN 12/29/2017 0027  JORDANS Performed at [facility], [place] [address] [place], [place] [address]   Magnesium     Status: Abnormal   Collection Time: 12/28/17 11:18 PM  Result Value Ref Range   Magnesium 1.2 (L) 1.7 - 2.4 mg/dL    Comment: Performed at [facility] [address] [place], [place] [address]  Protime-INR     Status: Abnormal   Collection Time: 12/28/17 11:18 PM  Result Value Ref Range   Prothrombin Time 18.7 (H) 11.4 - 15.2 seconds   INR 1.59     Comment: Performed at [facility] [address] [place], [place] [address]  I-Stat Venous Blood Gas, ED (order at [facility] and [facility] only)     Status: Abnormal   Collection Time: 12/28/17 11:56 PM  Result Value Ref Range   pH, Ven 7.363 7.250 - 7.430   pCO2, Ven 41.8 (L) 44.0 - 60.0 mmHg   pO2, Ven 25.0 (LL) 32.0 - 45.0 mmHg   Bicarbonate 23.4 20.0 - 28.0 mmol/L   TCO2 25 22 - 32 mmol/L   O2 Saturation 37.0 %   Acid-base deficit 1.0 0.0 - 2.0 mmol/L   Patient temperature 101.8 F    Sample type VENOUS    Comment NOTIFIED PHYSICIAN   Urinalysis, Routine w reflex microscopic     Status: Abnormal   Collection Time: 12/29/17 12:03 AM  Result Value Ref Range   Color, Urine AMBER (A) YELLOW    Comment: BIOCHEMICALS MAY BE AFFECTED BY COLOR   APPearance CLOUDY (  A) CLEAR   Specific Gravity, Urine 1.021 1.005 - 1.030   pH 5.0 5.0 - 8.0   Glucose, UA 50 (A) NEGATIVE mg/dL   Hgb urine dipstick LARGE (A) NEGATIVE   Bilirubin Urine NEGATIVE NEGATIVE   Ketones, ur 20 (A) NEGATIVE mg/dL   Protein, ur 100 (A) NEGATIVE mg/dL   Nitrite NEGATIVE NEGATIVE   Leukocytes, UA NEGATIVE NEGATIVE   RBC / HPF 6-10 0 - 5 RBC/hpf   WBC, UA 6-10 0 - 5 WBC/hpf   Bacteria, UA RARE (A) NONE SEEN   Squamous Epithelial / LPF 0-5 0 - 5   Mucus PRESENT    Hyaline Casts, UA PRESENT    Granular Casts, UA PRESENT    Amorphous Crystal PRESENT     Comment: Performed at [facility], [address] [place], [place] [address]  Rapid urine drug screen  (hospital performed)     Status: None   Collection Time: 12/29/17 12:03 AM  Result Value Ref Range   Opiates NONE DETECTED NONE DETECTED   Cocaine NONE DETECTED NONE DETECTED   Benzodiazepines NONE DETECTED NONE DETECTED   Amphetamines NONE DETECTED NONE DETECTED   Tetrahydrocannabinol NONE DETECTED NONE DETECTED   Barbiturates NONE DETECTED NONE DETECTED    Comment: (NOTE) DRUG SCREEN FOR MEDICAL PURPOSES ONLY.  IF CONFIRMATION IS NEEDED FOR ANY PURPOSE, NOTIFY LAB WITHIN 5 DAYS. LOWEST DETECTABLE LIMITS FOR URINE DRUG SCREEN Drug Class                     Cutoff (ng/mL) Amphetamine and metabolites    1000 Barbiturate and metabolites    200 Benzodiazepine                 007 Tricyclics and metabolites     300 Opiates and metabolites        300 Cocaine and metabolites        300 THC                            50 Performed at [facility], [place] [address] [place], [place] [address]   I-Stat CG4 Lactic Acid, ED  (not at  [facility])     Status: Abnormal   Collection Time: 12/29/17 12:04 AM  Result Value Ref Range   Lactic Acid, Venous 3.12 (HH) 0.5 - 1.9 mmol/L   Comment NOTIFIED PHYSICIAN   Influenza panel by PCR (type A & B)     Status: None   Collection Time: 12/29/17  1:22 AM  Result Value Ref Range   Influenza A By PCR NEGATIVE NEGATIVE   Influenza B By PCR NEGATIVE NEGATIVE    Comment: (NOTE) The Xpert Xpress Flu assay is intended as an aid in the diagnosis of  influenza and should not be used as a sole basis for treatment.  This  assay is FDA approved for nasopharyngeal swab specimens only. Nasal  washings and aspirates are unacceptable for Xpert Xpress Flu testing. Performed at [facility], [place] [address] [place],  [address]   Comprehensive metabolic panel     Status: Abnormal   Collection Time: 12/29/17  2:54 AM  Result Value Ref Range   Sodium 137 135 - 145 mmol/L   Potassium 2.8 (L) 3.5 - 5.1 mmol/L   Chloride 106 98 - 111 mmol/L   CO2 15 (L)  22 - 32 mmol/L   Glucose, Bld 220 (H) 70 -  99 mg/dL   BUN 21 8 - 23 mg/dL   Creatinine, Ser 1.40 (H) 0.61 - 1.24 mg/dL   Calcium 7.7 (L) 8.9 - 10.3 mg/dL   Total Protein 5.8 (L) 6.5 - 8.1 g/dL   Albumin 2.7 (L) 3.5 - 5.0 g/dL   AST 24 15 - 41 U/L   ALT 12 0 - 44 U/L   Alkaline Phosphatase 33 (L) 38 - 126 U/L   Total Bilirubin 2.0 (H) 0.3 - 1.2 mg/dL   GFR calc non Af Amer 48 (L) >60 mL/min   GFR calc Af Amer 55 (L) >60 mL/min    Comment: (NOTE) The eGFR has been calculated using the CKD EPI equation. This calculation has not been validated in all clinical situations. eGFR's persistently <60 mL/min signify possible Chronic Kidney Disease.    Anion gap 16 (H) 5 - 15    Comment: Performed at [facility], [place] [address] [place], [place] [address]  CBC     Status: Abnormal   Collection Time: 12/29/17  2:54 AM  Result Value Ref Range   WBC 10.7 (H) 4.0 - 10.5 K/uL   RBC 3.16 (L) 4.22 - 5.81 MIL/uL   Hemoglobin 8.5 (L) 13.0 - 17.0 g/dL   HCT 28.1 (L) 39.0 - 52.0 %   MCV 88.9 80.0 - 100.0 fL   MCH 26.9 26.0 - 34.0 pg   MCHC 30.2 30.0 - 36.0 g/dL   RDW 15.9 (H) 11.5 - 15.5 %   Platelets 120 (L) 150 - 400 K/uL   nRBC 0.0 0.0 - 0.2 %    Comment: Performed at [facility] [address], [place], [place] [address]  APTT     Status: Abnormal   Collection Time: 12/29/17  2:54 AM  Result Value Ref Range   aPTT 52 (H) 24 - 36 seconds    Comment:        IF BASELINE aPTT IS ELEVATED, SUGGEST PATIENT RISK ASSESSMENT BE USED TO DETERMINE APPROPRIATE ANTICOAGULANT THERAPY. Performed at [facility], [place] [address], [place], [place] [address]   Lactic acid, plasma     Status: Abnormal   Collection Time: 12/29/17  2:54 AM  Result Value Ref Range   Lactic Acid, Venous 2.1 (HH) 0.5 - 1.9 mmol/L    Comment: CRITICAL RESULT CALLED TO, READ BACK BY AND VERIFIED WITH: OLDLAND,B RN 12/29/2017 0331 JORDANS Performed at [facility], [place] [address], [place], [place]  [address]   Procalcitonin     Status: None   Collection Time: 12/29/17  2:54 AM  Result Value Ref Range   Procalcitonin 3.80 ng/mL    Comment:        Interpretation: PCT > 2 ng/mL: Systemic infection (sepsis) is likely, unless other causes are known. (NOTE)       Sepsis PCT Algorithm           Lower Respiratory Tract                                      Infection PCT Algorithm    ----------------------------     ----------------------------         PCT < 0.25 ng/mL                PCT < 0.10 ng/mL         Strongly encourage  Strongly discourage   discontinuation of antibiotics    initiation of antibiotics    ----------------------------     -----------------------------       PCT 0.25 - 0.50 ng/mL            PCT 0.10 - 0.25 ng/mL               OR       >80% decrease in PCT            Discourage initiation of                                            antibiotics      Encourage discontinuation           of antibiotics    ----------------------------     -----------------------------         PCT >= 0.50 ng/mL              PCT 0.26 - 0.50 ng/mL               AND       <80% decrease in PCT              Encourage initiation of                                             antibiotics       Encourage continuation           of antibiotics    ----------------------------     -----------------------------        PCT >= 0.50 ng/mL                  PCT > 0.50 ng/mL               AND         increase in PCT                  Strongly encourage                                      initiation of antibiotics    Strongly encourage escalation           of antibiotics                                     -----------------------------                                           PCT <= 0.25 ng/mL                                                 OR                                        >  80% decrease in PCT                                     Discontinue / Do not initiate                                              antibiotics Performed at [facility], [place] [address] [place], [place] [address]   Hemoglobin A1c     Status: Abnormal   Collection Time: 12/29/17  2:54 AM  Result Value Ref Range   Hgb A1c MFr Bld 7.1 (H) 4.8 - 5.6 %    Comment: (NOTE) Pre diabetes:          5.7%-6.4% Diabetes:              >6.4% Glycemic control for   <7.0% adults with diabetes    Mean Plasma Glucose 157.07 mg/dL    Comment: Performed at [facility] [address] [place], [place] [address]  Lipid panel     Status: Abnormal   Collection Time: 12/29/17  2:54 AM  Result Value Ref Range   Cholesterol 127 0 - 200 mg/dL   Triglycerides 87 <150 mg/dL   HDL 27 (L) >40 mg/dL   Total CHOL/HDL Ratio 4.7 RATIO   VLDL 17 0 - 40 mg/dL   LDL Cholesterol 83 0 - 99 mg/dL    Comment:        Total Cholesterol/HDL:CHD Risk Coronary Heart Disease Risk Table                     Men   Women  1/2 Average Risk   3.4   3.3  Average Risk       5.0   4.4  2 X Average Risk   9.6   7.1  3 X Average Risk  23.4   11.0        Use the calculated Patient Ratio above and the CHD Risk Table to determine the patient's CHD Risk.        ATP III CLASSIFICATION (LDL):  <100     mg/dL   Optimal  100-129  mg/dL   Near or Above                    Optimal  130-159  mg/dL   Borderline  160-189  mg/dL   High  >190     mg/dL   Very High Performed at [facility] [address] [place] [address], [place], [place] [address]   Troponin I (q 6hr x 3)     Status: Abnormal   Collection Time: 12/29/17  2:54 AM  Result Value Ref Range   Troponin I 0.26 (HH) <0.03 ng/mL    Comment: CRITICAL VALUE NOTED.  VALUE IS CONSISTENT WITH PREVIOUSLY REPORTED AND CALLED VALUE. Performed at [facility], [place] [address] [place], [place] [address]   Brain natriuretic peptide     Status: Abnormal   Collection Time: 12/29/17  2:54 AM  Result Value Ref Range   B Natriuretic Peptide 1,059.0 (H) 0.0 - 100.0 pg/mL    Comment:  Performed at [facility] [address], [place], [place] [address]  Type and screen [place]  Status: None   Collection Time: 12/29/17  3:53 AM  Result Value Ref Range   ABO/RH(D) O POS    Antibody Screen NEG    Sample Expiration      01/01/2018 Performed at [facility], [place] [address], [place], [place] [address]   Lactic acid, plasma     Status: Abnormal   Collection Time: 12/29/17  5:31 AM  Result Value Ref Range   Lactic Acid, Venous 2.3 (HH) 0.5 - 1.9 mmol/L    Comment: CRITICAL RESULT CALLED TO, READ BACK BY AND VERIFIED WITH: HICKS,W RN 12/29/2017 0721 JORDANS Performed at [facility], [place] [address], [place], [place] [address]    Dg Chest 2 View  Result Date: 12/28/2017 CLINICAL DATA:  Altered mental status EXAM: CHEST - 2 VIEW COMPARISON:  07/06/2017 FINDINGS: Post sternotomy changes and valve prosthesis. Low lung volumes. Mild cardiomegaly. Aortic atherosclerosis. No pneumothorax. IMPRESSION: No active cardiopulmonary disease.  Low lung volumes. Electronically Signed   By: Donavan Foil M.D.   On: 12/28/2017 23:18   Ct Head Wo Contrast  Result Date: 12/29/2017 CLINICAL DATA:  77 y/o M; found in bed covered urine. Altered level of consciousness, unexplained. EXAM: CT HEAD WITHOUT CONTRAST TECHNIQUE: Contiguous axial images were obtained from the base of the skull through the vertex without intravenous contrast. COMPARISON:  06/04/2017 CT head. FINDINGS: Brain: No evidence of acute infarction, hemorrhage, hydrocephalus, extra-axial collection or mass lesion/mass effect of brain parenchyma. 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 The pituitary mass abuts the optic chiasm and pre chiasmatic optic nerves without significant displacement. Stable small chronic infarction within the left frontal operculum, chronic microvascular ischemic changes of the brain, and volume loss. Vascular: Foci of air  present throughout the venous structures of the face and the cavernous sinus, likely after Janik. Calcific atherosclerosis of carotid siphons. No hyperdense vessel identified. Skull: Normal. Negative for fracture or focal lesion. Sinuses/Orbits: Mild mucosal thickening of the maxillary sinuses and the frontal sinuses. Small maxillary sinus mucous retention cyst. Normal aeration of the mastoid air cells. Other: None. IMPRESSION: 1. No acute intracranial abnormality identified. 2. Stable mass within the pituitary fossa with mild mass effect on the optic chiasm and pre chiasmatic optic nerves. 3. Stable small chronic infarction in left frontal operculum, chronic microvascular ischemic changes of the brain, and volume loss. Electronically Signed   By: Kristine Garbe M.D.   On: 12/29/2017 03:58   Ct Abdomen Pelvis W Contrast  Result Date: 12/29/2017 CLINICAL DATA:  Patient found down. Fever and suspected abscess. EXAM: CT ABDOMEN AND PELVIS WITH CONTRAST TECHNIQUE: Multidetector CT imaging of the abdomen and pelvis was performed using the standard protocol following bolus administration of intravenous contrast. CONTRAST:  117m OMNIPAQUE IOHEXOL 300 MG/ML  SOLN COMPARISON:  None. FINDINGS: Lower chest: Motion artifact limits examination. Atelectasis in the lung bases. Postoperative changes in the mediastinum. Cardiac enlargement. Hepatobiliary: No focal liver lesions. Gallbladder wall is thickened and edematous with some stranding in the surrounding fat. This suggest cholecystitis. No stones are visualized. No bile duct dilatation. Pancreas: Fatty infiltration of the pancreas. No focal lesion or inflammatory changes appreciated. Spleen: Normal in size without focal abnormality. Adrenals/Urinary Tract: Adrenal glands are unremarkable. Kidneys are normal, without renal calculi, focal lesion, or hydronephrosis. Bladder is decompressed. Stomach/Bowel: Stomach, small bowel, and colon are not abnormally  distended. No wall thickening or inflammatory infiltration are demonstrated. The appendix is normal. Vascular/Lymphatic: Aortic atherosclerosis. No enlarged abdominal  or pelvic lymph nodes. Reproductive: Prostate is unremarkable. Other: No free air or free fluid in the abdomen or pelvis. No loculated fluid collections to suggest abscess. Abdominal wall musculature appears intact. Musculoskeletal: Degenerative changes in the lumbar spine. No destructive bone lesions. IMPRESSION: Gallbladder wall thickening and edema with surrounding infiltration suggesting acute cholecystitis. No stones are visualized. No evidence of bowel obstruction or inflammation. No abscess identified. Electronically Signed   By: Lucienne Capers M.D.   On: 12/29/2017 01:26      Assessment/Plan Cholecystitis, acalculous per CT scan -the patient is currently very altered and agitated.  Medicine team is working to correct this problem.  Given his current state as well as his medical problems including rising troponins, CHF, etc, he is not a good candidate for operative intervention at this time.  He will likely require a perc chole drain to be placed once he can be calmed and able to tolerate the procedure.  I will go ahead and write for a drain so he can be assessed.  Per his CT scan, he does not have gallstones; however, this can miss some stones.  We will follow.  Agree with zosyn for management of this condition currently.  AMS with significant agitation CHF AS, s/p valve replacement A fib, not on anticoagulation due to h/o GI bleed ARI Hypokalemia, replaced per medicine  Rising troponins, likely demand ischemia DM HTN Dementia  FEN - NPO, IVFs VTE - SCDs ID - [id number] --[place], [facility] 12/29/2017, 8:24 AM Pager: [phone]

## 2017-12-29 NOTE — H&P (View-Only) (Signed)
Cardiology Consultation:   Patient ID: Peter Weiss MRN: [id number]; DOB: 01/04/1941  Admit date: 12/28/2017 Date of Consult: 12/29/2017  Primary Care Provider: Avbuere, Edwin, MD Primary Cardiologist:  Gollan  Primary Electrophysiologist:  None   Patient Profile:   Peter Weiss is a 77 y.o. male with a hx of aortic stenosis-status post aortic valve replacement, hypertension, atrial fibrillation, history of GI bleed   who is being seen today for the evaluation of elevated troponin levels at the request of  Dr. Hongalgi..  History of Present Illness:   Peter Weiss 77-year-old gentleman with a history of aortic valve replacement and hypertension.  He is followed by Dr. Gollan. He was found at a motel today with altered mental status. He was incoherent.  He was shaking and yelling "help me".  He was brought to the [facility] emergency room.  CT scan revealed cholecystitis. Troponin  levels were found to be elevated. He is also been found to be positive for MRSA.     Past Medical History:  Diagnosis Date  . Aortic stenosis   . Arthritis   . CHF (congestive heart failure) (HCC)   . Chicken pox   . Depression   . Diabetes mellitus without complication (HCC)   . Heart murmur   . History of fainting spells of unknown cause   . Hyperlipidemia   . Hypertension   . Neuromuscular disorder (HCC)   . Urinary incontinence     Past Surgical History:  Procedure Laterality Date  . AORTIC VALVE REPLACEMENT N/A 04/26/2014   Procedure: AORTIC VALVE REPLACEMENT (AVR);  Surgeon: Steven C Hendrickson, MD;  Location: [facility] OR;  Service: Open Heart Surgery;  Laterality: N/A;  . CARDIAC CATHETERIZATION  03/2014  . ESOPHAGOGASTRODUODENOSCOPY N/A 07/04/2014   Procedure: ESOPHAGOGASTRODUODENOSCOPY (EGD);  Surgeon: Patrick Hung, MD;  Location: [facility] ENDOSCOPY;  Service: Endoscopy;  Laterality: N/A;  . goiter  2008  . MAZE N/A 04/26/2014   Procedure: MAZE;  Surgeon: Steven C Hendrickson,  MD;  Location: [facility] OR;  Service: Open Heart Surgery;  Laterality: N/A;  . MULTIPLE EXTRACTIONS WITH ALVEOLOPLASTY N/A 04/24/2014   Procedure: Extraction of tooth #'s 2,3,5,12,13,14,15,20 with allveoloplasty and gross debridement of remaining dentition and sectioning of bridge at mesial of abutment tooth #32.;  Surgeon: Ronald F Kulinski, DDS;  Location: [facility] OR;  Service: Oral Surgery;  Laterality: N/A;  . TEE WITHOUT CARDIOVERSION N/A 04/26/2014   Procedure: TRANSESOPHAGEAL ECHOCARDIOGRAM (TEE);  Surgeon: Steven C Hendrickson, MD;  Location: [facility] OR;  Service: Open Heart Surgery;  Laterality: N/A;     Home Medications:  Prior to Admission medications   Medication Sig Start Date End Date Taking? Authorizing Provider  donepezil (ARICEPT) 10 MG tablet Take 1 tablet (10 mg total) by mouth at bedtime. 07/09/17   Adhikari, Amrit, MD  furosemide (LASIX) 20 MG tablet Take 20 mg by mouth daily as needed (leg swelling).    [provider]  gabapentin (NEURONTIN) 400 MG capsule Take 400 mg by mouth 4 (four) times daily.     [provider]  ibuprofen (ADVIL,MOTRIN) 200 MG tablet Take 200 mg by mouth every 6 (six) hours as needed for moderate pain.    [provider]  metFORMIN (GLUCOPHAGE) 500 MG tablet Take 500 mg by mouth 2 (two) times daily with a meal.    [provider]  metoprolol tartrate (LOPRESSOR) 25 MG tablet Take 37.5 mg by mouth 2 (two) times daily.     [provider]  potassium   chloride (K-DUR) 10 MEQ tablet Take 10 mEq by mouth daily.    [provider]  sertraline (ZOLOFT) 50 MG tablet Take 1 tablet (50 mg total) by mouth daily. 07/31/14   Gollan, Timothy J, MD  simvastatin (ZOCOR) 20 MG tablet Take 1 tablet (20 mg total) by mouth at bedtime. 07/09/17   Adhikari, Amrit, MD  tamsulosin (FLOMAX) 0.4 MG CAPS capsule Take 1 capsule (0.4 mg total) by mouth daily. 07/31/14   Gollan, Timothy J, MD  tiZANidine (ZANAFLEX) 4 MG tablet Take 4 mg by mouth 2  (two) times daily as needed for muscle spasms.    [provider]    Inpatient Medications: Scheduled Meds: . aspirin  300 mg Rectal Daily  . folic acid  1 mg Oral Daily  . insulin aspart  0-5 Units Subcutaneous QHS  . insulin aspart  0-9 Units Subcutaneous TID WC  . metoprolol tartrate  5 mg Intravenous Q8H  . multivitamin with minerals  1 tablet Oral Daily  . [START ON 12/30/2017] thiamine injection  100 mg Intravenous Daily   Continuous Infusions: . 0.9 % NaCl with KCl 40 mEq / L 75 mL/hr (12/29/17 0904)  . magnesium sulfate 1 - 4 g bolus IVPB 2 g (12/29/17 1231)  . piperacillin-tazobactam (ZOSYN)  IV 3.375 g (12/29/17 1232)  . vancomycin     PRN Meds: acetaminophen **OR** acetaminophen, hydrALAZINE, LORazepam, morphine injection, ondansetron **OR** ondansetron (ZOFRAN) IV  Allergies:   No Known Allergies  Social History:   Social History   Socioeconomic History  . Marital status: Divorced    Spouse name: Not on file  . Number of children: Not on file  . Years of education: Not on file  . Highest education level: Not on file  Occupational History  . Not on file  Social Needs  . Financial resource strain: Not on file  . Food insecurity:    Worry: Not on file    Inability: Not on file  . Transportation needs:    Medical: Not on file    Non-medical: Not on file  Tobacco Use  . Smoking status: Never Smoker  . Smokeless tobacco: Never Used  Substance and Sexual Activity  . Alcohol use: Yes    Alcohol/week: 0.0 standard drinks  . Drug use: Yes    Types: Marijuana    Comment: once a month  . Sexual activity: Not Currently  Lifestyle  . Physical activity:    Days per week: Not on file    Minutes per session: Not on file  . Stress: Not on file  Relationships  . Social connections:    Talks on phone: Not on file    Gets together: Not on file    Attends religious service: Not on file    Active member of club or organization: Not on file    Attends  meetings of clubs or organizations: Not on file    Relationship status: Not on file  . Intimate partner violence:    Fear of current or ex partner: Not on file    Emotionally abused: Not on file    Physically abused: Not on file    Forced sexual activity: Not on file  Other Topics Concern  . Not on file  Social History Narrative   ** Merged History Encounter **       ** Merged History Encounter **        Family History:    Family History  Problem Relation Age of Onset  .   Alcohol abuse Mother   . Heart disease Mother   . Diabetes Mother   . Heart disease Father   . Diabetes Father   . Cancer Mother   . Cancer Father   . Alcohol abuse Father   . Alcohol abuse Brother      ROS:  Please see the history of present illness.   All other ROS reviewed and negative.     Physical Exam/Data:   Vitals:   12/29/17 0930 12/29/17 1030 12/29/17 1100 12/29/17 1200  BP: 135/76 (!) 146/91 138/87 (!) 137/92  Pulse: (!) 101 (!) 104 (!) 110 (!) 107  Resp: (!) 23 (!) 23 (!) 23 (!) 24  Temp:      TempSrc:      SpO2: 95% 97% 96% 97%  Weight:      Height:        Intake/Output Summary (Last 24 hours) at 12/29/2017 1258 Last data filed at 12/29/2017 1228 Gross per 24 hour  Intake 4185 ml  Output 375 ml  Net 3810 ml   Filed Weights   12/28/17 2225  Weight: 114.6 kg   Body mass index is 33.33 kg/m.  General: Elderly gentleman.  Appears to be chronically ill.  He is very pale. HEENT: Thick beard Lymph: no adenopathy Neck: Beard, difficult to assess JVD. Endocrine:  No thryomegaly Vascular: No carotid bruits; FA pulses 2+ bilaterally without bruits  Cardiac:    Regular rate, distant heart sounds Lungs:  clear to auscultation bilaterally, no wheezing, rhonchi or rales  Abd: Poor bowel sounds.  Tender. Ext: Legs were extremely pale and mottled.  Pulses were obtained via the Doppler. Musculoskeletal:  No deformities, BUE and BLE strength normal and equal Skin: warm and dry    Neuro: Unable to assess.  He would cry out in pain occasionally.  Moves both arms regularly.  Would not open eyes.  Would not cooperate with any exam. Psych: Unable to assess.  EKG:  The EKG was personally reviewed and demonstrates: Sinus tachycardia Telemetry:  Telemetry was personally reviewed and demonstrates: Tachycardia.  No ST or T wave changes.  Relevant CV Studies:   Laboratory Data:  Chemistry Recent Labs  Lab 12/28/17 2232 12/29/17 0254 12/29/17 0753  NA 135 137 136  K 2.8* 2.8* 3.5  CL 96* 106 107  CO2 21* 15* 17*  GLUCOSE 227* 220* 255*  BUN 20 21 23  CREATININE 1.45* 1.40* 1.48*  CALCIUM 8.8* 7.7* 7.5*  GFRNONAA 46* 48* 45*  GFRAA 53* 55* 52*  ANIONGAP 18* 16* 12    Recent Labs  Lab 12/28/17 2232 12/29/17 0254 12/29/17 0753  PROT 7.3 5.8* 6.1*  ALBUMIN 3.3* 2.7* 2.7*  AST 23 24 34  ALT 13 12 15  ALKPHOS 45 33* 35*  BILITOT 1.9* 2.0* 1.9*   Hematology Recent Labs  Lab 12/28/17 2232 12/29/17 0254 12/29/17 0753  WBC 14.0* 10.7* 13.7*  RBC 3.71* 3.16* 3.38*  HGB 10.2* 8.5* 9.0*  HCT 33.0* 28.1* 30.6*  MCV 88.9 88.9 90.5  MCH 27.5 26.9 26.6  MCHC 30.9 30.2 29.4*  RDW 15.9* 15.9* 16.2*  PLT 166 120* 95*   Cardiac Enzymes Recent Labs  Lab 12/28/17 2318 12/29/17 0254 12/29/17 0753  TROPONINI 0.14* 0.26* 0.24*   No results for input(s): TROPIPOC in the last 168 hours.  BNP Recent Labs  Lab 12/29/17 0254  BNP 1,059.0*    DDimer No results for input(s): DDIMER in the last 168 hours.  Radiology/Studies:  Dg Chest 2 View    Result Date: 12/28/2017 CLINICAL DATA:  Altered mental status EXAM: CHEST - 2 VIEW COMPARISON:  07/06/2017 FINDINGS: Post sternotomy changes and valve prosthesis. Low lung volumes. Mild cardiomegaly. Aortic atherosclerosis. No pneumothorax. IMPRESSION: No active cardiopulmonary disease.  Low lung volumes. Electronically Signed   By: Kim  Fujinaga M.D.   On: 12/28/2017 23:18   Ct Head Wo Contrast  Result Date:  12/29/2017 CLINICAL DATA:  77 y/o M; found in bed covered urine. Altered level of consciousness, unexplained. EXAM: CT HEAD WITHOUT CONTRAST TECHNIQUE: Contiguous axial images were obtained from the base of the skull through the vertex without intravenous contrast. COMPARISON:  06/04/2017 CT head. FINDINGS: Brain: No evidence of acute infarction, hemorrhage, hydrocephalus, extra-axial collection or mass lesion/mass effect of brain parenchyma. 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 The pituitary mass abuts the optic chiasm and pre chiasmatic optic nerves without significant displacement. Stable small chronic infarction within the left frontal operculum, chronic microvascular ischemic changes of the brain, and volume loss. Vascular: Foci of air present throughout the venous structures of the face and the cavernous sinus, likely after Janik. Calcific atherosclerosis of carotid siphons. No hyperdense vessel identified. Skull: Normal. Negative for fracture or focal lesion. Sinuses/Orbits: Mild mucosal thickening of the maxillary sinuses and the frontal sinuses. Small maxillary sinus mucous retention cyst. Normal aeration of the mastoid air cells. Other: None. IMPRESSION: 1. No acute intracranial abnormality identified. 2. Stable mass within the pituitary fossa with mild mass effect on the optic chiasm and pre chiasmatic optic nerves. 3. Stable small chronic infarction in left frontal operculum, chronic microvascular ischemic changes of the brain, and volume loss. Electronically Signed   By: Lance  Furusawa-Stratton M.D.   On: 12/29/2017 03:58   Ct Abdomen Pelvis W Contrast  Result Date: 12/29/2017 CLINICAL DATA:  Patient found down. Fever and suspected abscess. EXAM: CT ABDOMEN AND PELVIS WITH CONTRAST TECHNIQUE: Multidetector CT imaging of the abdomen and pelvis was performed using the standard protocol following bolus administration of intravenous contrast. CONTRAST:   100mL OMNIPAQUE IOHEXOL 300 MG/ML  SOLN COMPARISON:  None. FINDINGS: Lower chest: Motion artifact limits examination. Atelectasis in the lung bases. Postoperative changes in the mediastinum. Cardiac enlargement. Hepatobiliary: No focal liver lesions. Gallbladder wall is thickened and edematous with some stranding in the surrounding fat. This suggest cholecystitis. No stones are visualized. No bile duct dilatation. Pancreas: Fatty infiltration of the pancreas. No focal lesion or inflammatory changes appreciated. Spleen: Normal in size without focal abnormality. Adrenals/Urinary Tract: Adrenal glands are unremarkable. Kidneys are normal, without renal calculi, focal lesion, or hydronephrosis. Bladder is decompressed. Stomach/Bowel: Stomach, small bowel, and colon are not abnormally distended. No wall thickening or inflammatory infiltration are demonstrated. The appendix is normal. Vascular/Lymphatic: Aortic atherosclerosis. No enlarged abdominal or pelvic lymph nodes. Reproductive: Prostate is unremarkable. Other: No free air or free fluid in the abdomen or pelvis. No loculated fluid collections to suggest abscess. Abdominal wall musculature appears intact. Musculoskeletal: Degenerative changes in the lumbar spine. No destructive bone lesions. IMPRESSION: Gallbladder wall thickening and edema with surrounding infiltration suggesting acute cholecystitis. No stones are visualized. No evidence of bowel obstruction or inflammation. No abscess identified. Electronically Signed   By: William  Stevens M.D.   On: 12/29/2017 01:26    Assessment and Plan:   1. Elevated troponin level: The patient has an elevated troponin level.  It is impossible to assess his clinical status.  He is not cooperative with the exam. His EKG does not look acute.   We will get an echocardiogram for assessment of his left ventricular function. At present he is not a candidate for invasive procedures. He has MRSA bacteremia.  It is possible  that his positive troponin levels might be associated with sepsis syndrome.  2.  Pallor: The patient's hemoglobin is 9.  There was no mention of aortic dissection on the abdominal CT scan.  Should be noted that it was not a contrast angiogram but the lumen of the aorta appears to be patent.  2.  History of aortic stenosis: Status post aortic valve replacement  4.  Cholecystitis: The patient apparently has cholecystitis.  It is impossible to assess him clinically but he has evidence of cholecystitis on CT scan.  Further plans per the surgery team.       For questions or updates, please contact [facility] Please consult [email or web address] for contact info under     Signed, Philip Nahser, MD  12/29/2017 12:58 PM  

## 2017-12-29 NOTE — Progress Notes (Signed)
PHARMACY - PHYSICIAN COMMUNICATION CRITICAL VALUE ALERT - BLOOD CULTURE IDENTIFICATION (BCID)  Peter Weiss is an 77 y.o. male who presented to [facility] on 12/28/2017 with a chief complaint of AMS.   Assessment:  77 YOM currently on antibiotics for acute cholecystitis now with 2 of 4 blood cultures growing GPC with BCID detecting MRSA. Per discussion with MD - the patient also has multiple skin wounds at different stages of healing.   Name of physician (or Provider) Contacted: Hongalgi  Current antibiotics: Zosyn 3.375g IV every 8 hours  Changes to prescribed antibiotics recommended:  Add Vancomycin - continue Zosyn for cholecystitis coverage. Automatic ID consult - will see later today and can make further recommendations on antibiotics.  Results for orders placed or performed during the hospital encounter of 12/28/17  Blood Culture ID Panel (Reflexed) (Collected: 12/28/2017 10:32 PM)  Result Value Ref Range   Enterococcus species NOT DETECTED NOT DETECTED   Listeria monocytogenes NOT DETECTED NOT DETECTED   Staphylococcus species DETECTED (A) NOT DETECTED   Staphylococcus aureus (BCID) DETECTED (A) NOT DETECTED   Methicillin resistance DETECTED (A) NOT DETECTED   Streptococcus species NOT DETECTED NOT DETECTED   Streptococcus agalactiae NOT DETECTED NOT DETECTED   Streptococcus pneumoniae NOT DETECTED NOT DETECTED   Streptococcus pyogenes NOT DETECTED NOT DETECTED   Acinetobacter baumannii NOT DETECTED NOT DETECTED   Enterobacteriaceae species NOT DETECTED NOT DETECTED   Enterobacter cloacae complex NOT DETECTED NOT DETECTED   Escherichia coli NOT DETECTED NOT DETECTED   Klebsiella oxytoca NOT DETECTED NOT DETECTED   Klebsiella pneumoniae NOT DETECTED NOT DETECTED   Proteus species NOT DETECTED NOT DETECTED   Serratia marcescens NOT DETECTED NOT DETECTED   Haemophilus influenzae NOT DETECTED NOT DETECTED   Neisseria meningitidis NOT DETECTED NOT DETECTED   Pseudomonas  aeruginosa NOT DETECTED NOT DETECTED   Candida albicans NOT DETECTED NOT DETECTED   Candida glabrata NOT DETECTED NOT DETECTED   Candida krusei NOT DETECTED NOT DETECTED   Candida parapsilosis NOT DETECTED NOT DETECTED   Candida tropicalis NOT DETECTED NOT DETECTED    Georgina Pillion, PharmD, BCPS Pager: [phone] 10:51 AM

## 2017-12-29 NOTE — Consult Note (Signed)
Chief Complaint: Patient was seen in consultation today for AMS  Referring Physician(s): Dr. Derrell Lolling  Supervising Physician: Gilmer Mor  Patient Status: [facility] - ED  History of Present Illness: Peter Weiss is a 77 y.o. male with past medical history of arthritis, CHF, DM, HLD, HTN who presented to [facility] ED from his [facility] stay motel with AMS and agitation.      CT Abdomen Pelvis 12/29/17 showed: Gallbladder wall thickening and edema with surrounding infiltration suggesting acute cholecystitis. No stones are visualized. No evidence of bowel obstruction or inflammation. No abscess Identified.  Patient currently in ED, although he has been admitted to hospitalist service.  He is agitated, but apparently much more calm since administration of ativan. Patient assessed by surgery who recommends percutaneous cholecystostomy tube placement for possible acute cholecystitis.  Case reviewed by Dr. Loreta Ave.   Past Medical History:  Diagnosis Date  . Aortic stenosis   . Arthritis   . CHF (congestive heart failure) (HCC)   . Chicken pox   . Depression   . Diabetes mellitus without complication (HCC)   . Heart murmur   . History of fainting spells of unknown cause   . Hyperlipidemia   . Hypertension   . Neuromuscular disorder (HCC)   . Urinary incontinence     Past Surgical History:  Procedure Laterality Date  . AORTIC VALVE REPLACEMENT N/A 04/26/2014   Procedure: AORTIC VALVE REPLACEMENT (AVR);  Surgeon: Loreli Slot, MD;  Location: [facility] OR;  Service: Open Heart Surgery;  Laterality: N/A;  . CARDIAC CATHETERIZATION  03/2014  . ESOPHAGOGASTRODUODENOSCOPY N/A 07/04/2014   Procedure: ESOPHAGOGASTRODUODENOSCOPY (EGD);  Surgeon: Jeani Hawking, MD;  Location: [facility] ENDOSCOPY;  Service: Endoscopy;  Laterality: N/A;  . goiter  2008  . MAZE N/A 04/26/2014   Procedure: MAZE;  Surgeon: Loreli Slot, MD;  Location: [facility] OR;  Service: Open Heart Surgery;  Laterality: N/A;  .  MULTIPLE EXTRACTIONS WITH ALVEOLOPLASTY N/A 04/24/2014   Procedure: Extraction of tooth #'s 2,3,5,12,13,14,15,20 with allveoloplasty and gross debridement of remaining dentition and sectioning of bridge at mesial of abutment tooth #32.;  Surgeon: Charlynne Pander, DDS;  Location: [facility] OR;  Service: Oral Surgery;  Laterality: N/A;  . TEE WITHOUT CARDIOVERSION N/A 04/26/2014   Procedure: TRANSESOPHAGEAL ECHOCARDIOGRAM (TEE);  Surgeon: Loreli Slot, MD;  Location: [facility] OR;  Service: Open Heart Surgery;  Laterality: N/A;    Allergies: Patient has no known allergies.  Medications: Prior to Admission medications   Medication Sig Start Date End Date Taking? Authorizing Provider  donepezil (ARICEPT) 10 MG tablet Take 1 tablet (10 mg total) by mouth at bedtime. 07/09/17   Burnadette Pop, MD  furosemide (LASIX) 20 MG tablet Take 20 mg by mouth daily as needed (leg swelling).    [provider]  gabapentin (NEURONTIN) 400 MG capsule Take 400 mg by mouth 4 (four) times daily.     [provider]  ibuprofen (ADVIL,MOTRIN) 200 MG tablet Take 200 mg by mouth every 6 (six) hours as needed for moderate pain.    [provider]  metFORMIN (GLUCOPHAGE) 500 MG tablet Take 500 mg by mouth 2 (two) times daily with a meal.    [provider]  metoprolol tartrate (LOPRESSOR) 25 MG tablet Take 37.5 mg by mouth 2 (two) times daily.     [provider]  potassium chloride (K-DUR) 10 MEQ tablet Take 10 mEq by mouth daily.    [provider]  sertraline (ZOLOFT) 50 MG tablet Take  1 tablet (50 mg total) by mouth daily. 07/31/14   Antonieta Iba, MD  simvastatin (ZOCOR) 20 MG tablet Take 1 tablet (20 mg total) by mouth at bedtime. 07/09/17   Burnadette Pop, MD  tamsulosin (FLOMAX) 0.4 MG CAPS capsule Take 1 capsule (0.4 mg total) by mouth daily. 07/31/14   Antonieta Iba, MD  tiZANidine (ZANAFLEX) 4 MG tablet Take 4 mg by mouth 2 (two) times daily as needed for  muscle spasms.    [provider]     Family History  Problem Relation Age of Onset  . Alcohol abuse Mother   . Heart disease Mother   . Diabetes Mother   . Heart disease Father   . Diabetes Father   . Cancer Mother   . Cancer Father   . Alcohol abuse Father   . Alcohol abuse Brother     Social History   Socioeconomic History  . Marital status: Divorced    Spouse name: Not on file  . Number of children: Not on file  . Years of education: Not on file  . Highest education level: Not on file  Occupational History  . Not on file  Social Needs  . Financial resource strain: Not on file  . Food insecurity:    Worry: Not on file    Inability: Not on file  . Transportation needs:    Medical: Not on file    Non-medical: Not on file  Tobacco Use  . Smoking status: Never Smoker  . Smokeless tobacco: Never Used  Substance and Sexual Activity  . Alcohol use: Yes    Alcohol/week: 0.0 standard drinks  . Drug use: Yes    Types: Marijuana    Comment: once a month  . Sexual activity: Not Currently  Lifestyle  . Physical activity:    Days per week: Not on file    Minutes per session: Not on file  . Stress: Not on file  Relationships  . Social connections:    Talks on phone: Not on file    Gets together: Not on file    Attends religious service: Not on file    Active member of club or organization: Not on file    Attends meetings of clubs or organizations: Not on file    Relationship status: Not on file  Other Topics Concern  . Not on file  Social History Narrative   ** Merged History Encounter **       ** Merged History Encounter **         Review of Systems: A 12 point ROS discussed and pertinent positives are indicated in the HPI above.  All other systems are negative.  Review of Systems  Unable to perform ROS: Mental status change    Vital Signs: BP 135/76   Pulse (!) 101   Temp (!) 100.6 F (38.1 C) (Rectal)   Resp (!) 23   Ht 6\' 1"  (1.854 m)    Wt 252 lb 10.4 oz (114.6 kg)   SpO2 95%   BMI 33.33 kg/m   Physical Exam  Constitutional: He is oriented to person, place, and time. He appears well-developed. No distress.  Arouses, grunts when touched  Cardiovascular: Normal rate, regular rhythm and normal heart sounds. Exam reveals no gallop and no friction rub.  No murmur heard. Pulmonary/Chest: Effort normal and breath sounds normal. No respiratory distress.  Abdominal: Soft. He exhibits no distension. There is no guarding.  Neurological: He is alert and oriented to  person, place, and time.  Skin: Skin is warm and dry. He is not diaphoretic.  Psychiatric: He has a normal mood and affect. His behavior is normal. Judgment and thought content normal.  Nursing note and vitals reviewed.    MD Evaluation Airway: WNL Heart: WNL Abdomen: WNL Chest/ Lungs: WNL ASA  Classification: 3 Mallampati/Airway Score: Two   Imaging: Dg Chest 2 View  Result Date: 12/28/2017 CLINICAL DATA:  Altered mental status EXAM: CHEST - 2 VIEW COMPARISON:  07/06/2017 FINDINGS: Post sternotomy changes and valve prosthesis. Low lung volumes. Mild cardiomegaly. Aortic atherosclerosis. No pneumothorax. IMPRESSION: No active cardiopulmonary disease.  Low lung volumes. Electronically Signed   By: Jasmine Pang M.D.   On: 12/28/2017 23:18   Ct Head Wo Contrast  Result Date: 12/29/2017 CLINICAL DATA:  77 y/o M; found in bed covered urine. Altered level of consciousness, unexplained. EXAM: CT HEAD WITHOUT CONTRAST TECHNIQUE: Contiguous axial images were obtained from the base of the skull through the vertex without intravenous contrast. COMPARISON:  06/04/2017 CT head. FINDINGS: Brain: No evidence of acute infarction, hemorrhage, hydrocephalus, extra-axial collection or mass lesion/mass effect of brain parenchyma. 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 The pituitary mass abuts the optic chiasm and pre chiasmatic optic  nerves without significant displacement. Stable small chronic infarction within the left frontal operculum, chronic microvascular ischemic changes of the brain, and volume loss. Vascular: Foci of air present throughout the venous structures of the face and the cavernous sinus, likely after Janik. Calcific atherosclerosis of carotid siphons. No hyperdense vessel identified. Skull: Normal. Negative for fracture or focal lesion. Sinuses/Orbits: Mild mucosal thickening of the maxillary sinuses and the frontal sinuses. Small maxillary sinus mucous retention cyst. Normal aeration of the mastoid air cells. Other: None. IMPRESSION: 1. No acute intracranial abnormality identified. 2. Stable mass within the pituitary fossa with mild mass effect on the optic chiasm and pre chiasmatic optic nerves. 3. Stable small chronic infarction in left frontal operculum, chronic microvascular ischemic changes of the brain, and volume loss. Electronically Signed   By: Mitzi Hansen M.D.   On: 12/29/2017 03:58   Ct Abdomen Pelvis W Contrast  Result Date: 12/29/2017 CLINICAL DATA:  Patient found down. Fever and suspected abscess. EXAM: CT ABDOMEN AND PELVIS WITH CONTRAST TECHNIQUE: Multidetector CT imaging of the abdomen and pelvis was performed using the standard protocol following bolus administration of intravenous contrast. CONTRAST:  OMNIPAQUE IOHEXOL 300 MG/ML  SOLN COMPARISON:  None. FINDINGS: Lower chest: Motion artifact limits examination. Atelectasis in the lung bases. Postoperative changes in the mediastinum. Cardiac enlargement. Hepatobiliary: No focal liver lesions. Gallbladder wall is thickened and edematous with some stranding in the surrounding fat. This suggest cholecystitis. No stones are visualized. No bile duct dilatation. Pancreas: Fatty infiltration of the pancreas. No focal lesion or inflammatory changes appreciated. Spleen: Normal in size without focal abnormality. Adrenals/Urinary Tract: Adrenal  glands are unremarkable. Kidneys are normal, without renal calculi, focal lesion, or hydronephrosis. Bladder is decompressed. Stomach/Bowel: Stomach, small bowel, and colon are not abnormally distended. No wall thickening or inflammatory infiltration are demonstrated. The appendix is normal. Vascular/Lymphatic: Aortic atherosclerosis. No enlarged abdominal or pelvic lymph nodes. Reproductive: Prostate is unremarkable. Other: No free air or free fluid in the abdomen or pelvis. No loculated fluid collections to suggest abscess. Abdominal wall musculature appears intact. Musculoskeletal: Degenerative changes in the lumbar spine. No destructive bone lesions. IMPRESSION: Gallbladder wall thickening and edema with surrounding infiltration suggesting acute cholecystitis. No stones  are visualized. No evidence of bowel obstruction or inflammation. No abscess identified. Electronically Signed   By: Burman Nieves M.D.   On: 12/29/2017 01:26    Labs:  CBC: Recent Labs    07/07/17 0236 12/28/17 2232 12/29/17 0254 12/29/17 0753  WBC 8.2 14.0* 10.7* 13.7*  HGB 10.3* 10.2* 8.5* 9.0*  HCT 32.4* 33.0* 28.1* 30.6*  PLT 218 166 120* 95*    COAGS: Recent Labs    12/28/17 2318 12/29/17 0254  INR 1.59  --   APTT  --  52*    BMP: Recent Labs    07/09/17 0637 12/28/17 2232 12/29/17 0254 12/29/17 0753  NA 139 135 137 136  K 4.0 2.8* 2.8* 3.5  CL 102 96* 106 107  CO2 27 21* 15* 17*  GLUCOSE 163* 227* 220* 255*  BUN 15 20 21 23   CALCIUM 9.0 8.8* 7.7* 7.5*  CREATININE 1.01 1.45* 1.40* 1.48*  GFRNONAA >60 46* 48* 45*  GFRAA >60 53* 55* 52*    LIVER FUNCTION TESTS: Recent Labs    07/06/17 2105 12/28/17 2232 12/29/17 0254 12/29/17 0753  BILITOT 0.9 1.9* 2.0* 1.9*  AST 23 23 24  34  ALT 16* 13 12 15   ALKPHOS 57 45 33* 35*  PROT 6.5 7.3 5.8* 6.1*  ALBUMIN 3.4* 3.3* 2.7* 2.7*    TUMOR MARKERS: No results for input(s): AFPTM, CEA, CA199, CHROMGRNA in the last 8760 hours.  Assessment and  Plan: Possible acalculus cholecystitis Patient admitted with AMS, agitation.  WBC elevated at 13.7 Tbili 1.9 INR 1.59 Febrile on admission.  On Zosyn.  CT Abdomen shows gallbladder wall thickening, but no stones.  IR consulted for percutaneous drainage.  Imaging reviewed by Dr. Loreta Ave who recommends HIDA scan once able.   IR to follow.   Thank you for this interesting consult.  I greatly enjoyed meeting Peter Weiss and look forward to participating in their care.  A copy of this report was sent to the requesting provider on this date.  Electronically Signed: Hoyt Koch, PA 12/29/2017, 10:52 AM   I spent a total of 40 Minutes    in face to face in clinical consultation, greater than 50% of which was counseling/coordinating care for acute cholecystitis

## 2017-12-29 NOTE — ED Notes (Signed)
Patient transported to CT 

## 2017-12-29 NOTE — Progress Notes (Signed)
Patient's HIDA is negative for cholecystitis.  His blood cultures are positive for MRSA.  No further surgical needs.  Discussed with primary service.  Letha Cape 3:34 PM 12/29/2017

## 2017-12-29 NOTE — Consult Note (Signed)
[facility]    Date of Admission:  12/28/2017             Reason for Consult: MRSA Bacteremia  Referring Provider: [employer] [facility] Primary Care Provider: Fleet Contras, MD   Assessment/Plan:  Peter Weiss is a 77 year old male with history of aortic stenosis s/p bovine valve replacement found unresponsive and covered in urine in an extended stay motel room with baseline normal mentation per bystander now with confusion and abdominal pain and history of dementia. There is concern for sepsis related to cholecystitis, however found to have MRSA bacteremia. No abscesses/lesions were noted on imaging.  HIDA scan normal. Encephalopathy of unclear origin and limited history. CT of the head with no acute process with a stable mass in the pituitary fossa.  Antimicrobial therapy with vancomycin and piperacillin-tazobactam currently. Echocardiogram ordered and pending. Will likely need prolonged therapy with concern for aortic valve replacement.   1. Continue current dose of vancomycin for MRSA bacteremia. Discontinue piperacillin-tazobactim.  2. Repeat blood cultures in the morning.  3. Await TTE results and monitor fever curve and repeat cultures.        [facility] Staphylococcus aureus bacteremia   Staphylococcus aureus bacteremia (SAB) is associated with a high rate of complications and mortality.  Specific aspects of clinical management are critical to optimizing the outcome of patients with SAB.  Therefore, the [facility] Antimicrobial Management Team [name] has initiated an intervention aimed at improving the management of SAB at [name].  To do so, Infectious Diseases physicians are providing an evidence-based consult for the management of all patients with SAB.     Yes No Comments  Perform follow-up blood cultures (even if the patient is afebrile) to ensure clearance of bacteremia [x]  []    Remove vascular catheter and  obtain follow-up blood cultures after the removal of the catheter []  [x]  None present  Perform echocardiography to evaluate for endocarditis (transthoracic ECHO is 40-50% sensitive, TEE is > 90% sensitive) [x]  []  TTE results pending.   Consult electrophysiologist to evaluate implanted cardiac device (pacemaker, ICD) []  [x]    Ensure source control [x]  []  No current source, question possible endocarditis?  Investigate for "metastatic" sites of infection [x]  []  Does the patient have ANY symptom or physical exam finding that would suggest a deeper infection (back or neck pain that may be suggestive of vertebral osteomyelitis or epidural abscess, muscle pain that could be a symptom of pyomyositis)?  Keep in mind that for deep seeded infections MRI imaging with contrast is preferred rather than other often insensitive tests such as plain x-rays, especially early in a patient's presentation.  Change antibiotic therapy to __________________ [x]  []  MRSA bacteremia changed to vancomycin  Estimated duration of IV antibiotic therapy:   []  []  Pending additional testing.     Principal Problem:   Acute cholecystitis Active Problems:   Type II diabetes mellitus with renal manifestations (HCC)   Aortic stenosis   Atrial fibrillation, chronic   Essential hypertension   Dementia (HCC)   Normocytic anemia   Sepsis (HCC)   CKD (chronic kidney disease), stage III (HCC)   Chronic diastolic CHF (congestive heart failure) (HCC)   BPH (benign prostatic hyperplasia)   Acute metabolic encephalopathy   Hypokalemia   Elevated troponin   . aspirin  300 mg Rectal Daily  . folic acid  1 mg Oral Daily  . insulin aspart  0-9 Units Subcutaneous Q4H  . metoprolol  tartrate  5 mg Intravenous Q8H  . multivitamin with minerals  1 tablet Oral Daily  . [START ON 12/30/2017] thiamine injection  100 mg Intravenous Daily     HPI: Peter Weiss is a 77 y.o. male with a previous medical history significant for aortic  valve stenosis, status post aortic valve repair with bovine valve, type 2 diabetes, dementia, degenerative disc disease, hypertension, and atrial fibrillation who presented to the ED with altered mental status and unable to provide any history.  EMS was called to extended-stay motel as patient was found by security found in bed and covered in urine. Per EMS report of bystander, patient is generally alert and oriented.  Noted to have abdominal tenderness on palpation in the emergency room with no facial droop present.   White blood cell count of 14, lactic acid 2.5 and urinalysis unremarkable.  He was hypokalemic at 2.8. Chest x-ray with no significant findings. CT of the abdomen consistent with cholecystitis and no obstructive stone. Noted to have a temperature of 100.6 in the ED with tachycardia. Blood cultures positive the MRSA. Initially received vancomycin, cefepime and flagyl prior to CT and then on Zoysn. Vancomycin re-added once cultures were found to be positive.  TTE pending.   Unable to obtain history from patient due to his current acute mental status change.    Review of Systems: Review of Systems  Unable to perform ROS: Acuity of condition     Past Medical History:  Diagnosis Date  . Aortic stenosis   . Arthritis   . CHF (congestive heart failure) (HCC)   . Chicken pox   . Depression   . Diabetes mellitus without complication (HCC)   . Heart murmur   . History of fainting spells of unknown cause   . Hyperlipidemia   . Hypertension   . Neuromuscular disorder (HCC)   . Urinary incontinence     Social History   Tobacco Use  . Smoking status: Never Smoker  . Smokeless tobacco: Never Used  Substance Use Topics  . Alcohol use: Yes    Alcohol/week: 0.0 standard drinks  . Drug use: Yes    Types: Marijuana    Comment: once a month    Family History  Problem Relation Age of Onset  . Alcohol abuse Mother   . Heart disease Mother   . Diabetes Mother   . Heart disease  Father   . Diabetes Father   . Cancer Mother   . Cancer Father   . Alcohol abuse Father   . Alcohol abuse Brother     No Known Allergies  OBJECTIVE: Blood pressure (!) 150/89, pulse (!) 130, temperature (!) 100.6 F (38.1 C), temperature source Rectal, resp. rate 18, height 6\' 1"  (1.854 m), weight 115 kg, SpO2 99 %.  Physical Exam  Constitutional: He is oriented to person, place, and time. He appears well-developed and well-nourished. He appears ill. No distress.  Lying in bed; stupor; responsive to tactile stimulation  Cardiovascular: Regular rhythm and intact distal pulses. Tachycardia present.  Murmur heard. Pulmonary/Chest: Effort normal and breath sounds normal.  Abdominal: There is generalized tenderness.  Generalized discomfort with any type of touch and question if this truly represents pain.   Neurological: He is oriented to person, place, and time.  Skin: Skin is warm and dry.  Psychiatric: He has a normal mood and affect. His behavior is normal. Judgment and thought content normal.    Lab Results Lab Results  Component Value Date  WBC 13.7 (H) 12/29/2017   HGB 9.0 (L) 12/29/2017   HCT 30.6 (L) 12/29/2017   MCV 90.5 12/29/2017   PLT 95 (L) 12/29/2017    Lab Results  Component Value Date   CREATININE 1.48 (H) 12/29/2017   BUN 23 12/29/2017   NA 136 12/29/2017   K 3.5 12/29/2017   CL 107 12/29/2017   CO2 17 (L) 12/29/2017    Lab Results  Component Value Date   ALT 15 12/29/2017   AST 34 12/29/2017   ALKPHOS 35 (L) 12/29/2017   BILITOT 1.9 (H) 12/29/2017     Microbiology: Recent Results (from the past 240 hour(s))  Blood Culture (routine x 2)     Status: None (Preliminary result)   Collection Time: 12/28/17 10:32 PM  Result Value Ref Range Status   Specimen Description BLOOD RIGHT ANTECUBITAL  Final   Special Requests   Final    BOTTLES DRAWN AEROBIC AND ANAEROBIC Blood Culture results may not be optimal due to an excessive volume of blood received  in culture bottles   Culture  Setup Time   Final    GRAM POSITIVE COCCI IN BOTH AEROBIC AND ANAEROBIC BOTTLES Organism ID to follow CRITICAL RESULT CALLED TO, READ BACK BY AND VERIFIED WITH: Antoine Primas PharmD 10:45 12/29/17 ([email or web address]) Performed at [facility], [address] [place], [place] [address]    Culture GRAM POSITIVE COCCI  Final   Report Status PENDING  Incomplete  Blood Culture ID Panel (Reflexed)     Status: Abnormal   Collection Time: 12/28/17 10:32 PM  Result Value Ref Range Status   Enterococcus species NOT DETECTED NOT DETECTED Final   Listeria monocytogenes NOT DETECTED NOT DETECTED Final   Staphylococcus species DETECTED (A) NOT DETECTED Final    Comment: CRITICAL RESULT CALLED TO, READ BACK BY AND VERIFIED WITH: Antoine Primas PharmD 10:45 12/29/17 ([email or web address])    Staphylococcus aureus (BCID) DETECTED (A) NOT DETECTED Final    Comment: Methicillin (oxacillin)-resistant Staphylococcus aureus (MRSA). MRSA is predictably resistant to beta-lactam antibiotics (except ceftaroline). Preferred therapy is vancomycin unless clinically contraindicated. Patient requires contact precautions if  hospitalized. CRITICAL RESULT CALLED TO, READ BACK BY AND VERIFIED WITH: Antoine Primas PharmD 10:45 12/29/17 ([email or web address])    Methicillin resistance DETECTED (A) NOT DETECTED Final    Comment: CRITICAL RESULT CALLED TO, READ BACK BY AND VERIFIED WITH: Antoine Primas PharmD 10:45 12/29/17 ([email or web address])    Streptococcus species NOT DETECTED NOT DETECTED Final   Streptococcus agalactiae NOT DETECTED NOT DETECTED Final   Streptococcus pneumoniae NOT DETECTED NOT DETECTED Final   Streptococcus pyogenes NOT DETECTED NOT DETECTED Final   Acinetobacter baumannii NOT DETECTED NOT DETECTED Final   Enterobacteriaceae species NOT DETECTED NOT DETECTED Final   Enterobacter cloacae complex NOT DETECTED NOT DETECTED Final   Escherichia coli NOT DETECTED NOT DETECTED Final   Klebsiella oxytoca NOT DETECTED NOT  DETECTED Final   Klebsiella pneumoniae NOT DETECTED NOT DETECTED Final   Proteus species NOT DETECTED NOT DETECTED Final   Serratia marcescens NOT DETECTED NOT DETECTED Final   Haemophilus influenzae NOT DETECTED NOT DETECTED Final   Neisseria meningitidis NOT DETECTED NOT DETECTED Final   Pseudomonas aeruginosa NOT DETECTED NOT DETECTED Final   Candida albicans NOT DETECTED NOT DETECTED Final   Candida glabrata NOT DETECTED NOT DETECTED Final   Candida krusei NOT DETECTED NOT DETECTED Final   Candida parapsilosis NOT DETECTED NOT DETECTED Final   Candida tropicalis NOT DETECTED NOT DETECTED Final    Comment: Performed  at [facility], [address] [place], [place] [address]  Blood Culture (routine x 2)     Status: None (Preliminary result)   Collection Time: 12/28/17 11:18 PM  Result Value Ref Range Status   Specimen Description BLOOD RIGHT HAND  Final   Special Requests   Final    BOTTLES DRAWN AEROBIC AND ANAEROBIC Blood Culture results may not be optimal due to an inadequate volume of blood received in culture bottles   Culture  Setup Time   Final    GRAM POSITIVE COCCI IN CLUSTERS IN BOTH AEROBIC AND ANAEROBIC BOTTLES Performed at [facility], [address] [place], [place] [address]    Culture [facility] POSITIVE COCCI  Final   Report Status PENDING  Incomplete     Marcos Eke, NP [facility] [phone] Pager  12/29/2017  2:40 PM

## 2017-12-29 NOTE — H&P (Addendum)
History and Physical    Peter Weiss WUJ:[id number] DOB: [birth year] DOA: 12/28/2017  Referring MD/NP/PA:   PCP: Fleet Contras, MD   Patient coming from:  The patient is coming from home.  At baseline, pt is independent for most of ADL.   Chief Complaint: AMS  HPI: Peter Weiss is a 77 y.o. male with medical history significant of aortic valve stenosis, s/p AVR (bovine valve), DM-II, dementia, lumbar DDD, AAA, hypertension, hyperlipidemia, depression, BPH, atrial fibrillation not on anticoagulants, GI bleeding, who presents with altered mental status.  Patient has AMS and dementia, and is unable to provide any medical history, therefore, most of the history is obtained by discussing the case with ED physician, per EMS report, and with the nursing staff.  Per report, EMS was called out to an extended stay motel by hotel security. The patient was found in bed and covered in urine. He is not oriented x 3. He has hx of dementia, baseline mental status is not clear. Per EDP, "EMS reports that while they were there that an unknown woman came into the room and put the patient's dog into the bathroom and then left.  The woman, who did not identify herself, states that the patient is normally alert and oriented and walks his dog independently daily". When I saw pt in ED, he is confused, not oriented x3.  No active respiratory distress, cough, nausea, vomiting or diarrhea noted.  He seems to have abdominal tenderness on palpation.  He moves all extremities.  No facial droop noted.  ED Course: pt was found to have WBC 14.0, lactic acid 2.5, 3.12, troponin 0.14, urinalysis (cloudy appearance, no leukocytes, rare bacteria, WBC 6-10), potassium 2.8, magnesium 1.8, slightly worsening renal function, liver function (ALP 45, AST 23, ALT 13, total bilirubin 1.9), temperature 104, tachycardia, tachypnea, oxygen satting 93 to 94% on room air, negative chest x-ray.  CT abdomen showed evidence  consistent with cholecystitis, no obstructive stone.  Patient is admitted to stepdown bed as inpatient.  General surgeon, Dr. Corliss Skains was consulted.  Review of Systems: Could not be reviewed due to altered mental status. .  Allergy: No Known Allergies  Past Medical History:  Diagnosis Date  . Aortic stenosis   . Arthritis   . CHF (congestive heart failure) (HCC)   . Chicken pox   . Depression   . Diabetes mellitus without complication (HCC)   . Heart murmur   . History of fainting spells of unknown cause   . Hyperlipidemia   . Hypertension   . Neuromuscular disorder (HCC)   . Urinary incontinence     Past Surgical History:  Procedure Laterality Date  . AORTIC VALVE REPLACEMENT N/A 04/26/2014   Procedure: AORTIC VALVE REPLACEMENT (AVR);  Surgeon: Loreli Slot, MD;  Location: [facility] OR;  Service: Open Heart Surgery;  Laterality: N/A;  . CARDIAC CATHETERIZATION  03/2014  . ESOPHAGOGASTRODUODENOSCOPY N/A 07/04/2014   Procedure: ESOPHAGOGASTRODUODENOSCOPY (EGD);  Surgeon: Jeani Hawking, MD;  Location: [facility] ENDOSCOPY;  Service: Endoscopy;  Laterality: N/A;  . goiter  2008  . MAZE N/A 04/26/2014   Procedure: MAZE;  Surgeon: Loreli Slot, MD;  Location: [facility] OR;  Service: Open Heart Surgery;  Laterality: N/A;  . MULTIPLE EXTRACTIONS WITH ALVEOLOPLASTY N/A 04/24/2014   Procedure: Extraction of tooth #'s 2,3,5,12,13,14,15,20 with allveoloplasty and gross debridement of remaining dentition and sectioning of bridge at mesial of abutment tooth #32.;  Surgeon: Charlynne Pander, DDS;  Location: [facility] OR;  Service: Oral Surgery;  Laterality: N/A;  . TEE WITHOUT CARDIOVERSION N/A 04/26/2014   Procedure: TRANSESOPHAGEAL ECHOCARDIOGRAM (TEE);  Surgeon: Loreli Slot, MD;  Location: [facility] OR;  Service: Open Heart Surgery;  Laterality: N/A;    Social History:  reports that he has never smoked. He has never used smokeless tobacco. He reports that he drinks alcohol. He reports that he has current or  past drug history. Drug: Marijuana.  Family History:  Family History  Problem Relation Age of Onset  . Alcohol abuse Mother   . Heart disease Mother   . Diabetes Mother   . Heart disease Father   . Diabetes Father   . Cancer Mother   . Cancer Father   . Alcohol abuse Father   . Alcohol abuse Brother      Prior to Admission medications   Medication Sig Start Date End Date Taking? Authorizing Provider  donepezil (ARICEPT) 10 MG tablet Take 1 tablet (10 mg total) by mouth at bedtime. 07/09/17   Burnadette Pop, MD  furosemide (LASIX) 20 MG tablet Take 20 mg by mouth daily as needed (leg swelling).    [provider]  gabapentin (NEURONTIN) 400 MG capsule Take 400 mg by mouth 4 (four) times daily.     [provider]  ibuprofen (ADVIL,MOTRIN) 200 MG tablet Take 200 mg by mouth every 6 (six) hours as needed for moderate pain.    [provider]  metFORMIN (GLUCOPHAGE) 500 MG tablet Take 500 mg by mouth 2 (two) times daily with a meal.    [provider]  metoprolol tartrate (LOPRESSOR) 25 MG tablet Take 37.5 mg by mouth 2 (two) times daily.     [provider]  potassium chloride (K-DUR) 10 MEQ tablet Take 10 mEq by mouth daily.    [provider]  sertraline (ZOLOFT) 50 MG tablet Take 1 tablet (50 mg total) by mouth daily. 07/31/14   Antonieta Iba, MD  simvastatin (ZOCOR) 20 MG tablet Take 1 tablet (20 mg total) by mouth at bedtime. 07/09/17   Burnadette Pop, MD  tamsulosin (FLOMAX) 0.4 MG CAPS capsule Take 1 capsule (0.4 mg total) by mouth daily. 07/31/14   Antonieta Iba, MD  tiZANidine (ZANAFLEX) 4 MG tablet Take 4 mg by mouth 2 (two) times daily as needed for muscle spasms.    [provider]    Physical Exam: Vitals:   12/29/17 0231 12/29/17 0245 12/29/17 0300 12/29/17 0315  BP:  (!) 149/81 (!) 160/86 (!) 154/90  Pulse:  (!) 110 (!) 108 (!) 109  Resp:  (!) 23 (!) 23 (!) 24  Temp: (!) 100.6 F (38.1 C)       TempSrc: Rectal     SpO2:  97% 96% 95%  Weight:      Height:       General: Not in acute distress HEENT:       Eyes: PERRL, EOMI, no scleral icterus.       ENT: No discharge from the ears and nose, no pharynx injection, no tonsillar enlargement.        Neck: No JVD, no bruit, no mass felt. Heme: No neck lymph node enlargement. Cardiac: S1/S2, irregularly irregular rhythm, No murmurs, No gallops or rubs. Respiratory: No rales, wheezing, rhonchi or rubs. GI: Soft, nondistended, has tenderness on palpation, no organomegaly, BS present. GU: No hematuria Ext: No pitting leg edema bilaterally. 2+DP/PT pulse bilaterally. Musculoskeletal: No joint deformities, No joint redness or warmth, no limitation of ROM in spin. Skin: No  rashes.  Neuron: confused, not oriented X3, not following commands, cranial nerves II-XII grossly intact, moves all extremities. Psych: Patient is not psychotic.   Labs on Admission: I have personally reviewed following labs and imaging studies  CBC: Recent Labs  Lab 12/28/17 2232 12/29/17 0254  WBC 14.0* 10.7*  NEUTROABS 13.2*  --   HGB 10.2* 8.5*  HCT 33.0* 28.1*  MCV 88.9 88.9  PLT 166 120*   Basic Metabolic Panel: Recent Labs  Lab 12/28/17 2232 12/28/17 2318 12/29/17 0254  NA 135  --  137  K 2.8*  --  2.8*  CL 96*  --  106  CO2 21*  --  15*  GLUCOSE 227*  --  220*  BUN 20  --  21  CREATININE 1.45*  --  1.40*  CALCIUM 8.8*  --  7.7*  MG  --  1.2*  --    GFR: Estimated Creatinine Clearance: 60.5 mL/min (A) (by C-G formula based on SCr of 1.4 mg/dL (H)). Liver Function Tests: Recent Labs  Lab 12/28/17 2232 12/29/17 0254  AST 23 24  ALT 13 12  ALKPHOS 45 33*  BILITOT 1.9* 2.0*  PROT 7.3 5.8*  ALBUMIN 3.3* 2.7*   No results for input(s): LIPASE, AMYLASE in the last 168 hours. No results for input(s): AMMONIA in the last 168 hours. Coagulation Profile: Recent Labs  Lab 12/28/17 2318  INR 1.59   Cardiac Enzymes: Recent Labs  Lab  12/28/17 2318 12/29/17 0254  TROPONINI 0.14* 0.26*   BNP (last 3 results) No results for input(s): PROBNP in the last 8760 hours. HbA1C: Recent Labs    12/29/17 0254  HGBA1C 7.1*   CBG: Recent Labs  Lab 12/28/17 2219  GLUCAP 197*   Lipid Profile: Recent Labs    12/29/17 0254  CHOL 127  HDL 27*  LDLCALC 83  TRIG 87  CHOLHDL 4.7   Thyroid Function Tests: No results for input(s): TSH, T4TOTAL, FREET4, T3FREE, THYROIDAB in the last 72 hours. Anemia Panel: No results for input(s): VITAMINB12, FOLATE, FERRITIN, TIBC, IRON, RETICCTPCT in the last 72 hours. Urine analysis:    Component Value Date/Time   COLORURINE AMBER (A) 12/29/2017 0003   APPEARANCEUR CLOUDY (A) 12/29/2017 0003   APPEARANCEUR Clear 03/19/2014 1200   LABSPEC 1.021 12/29/2017 0003   LABSPEC 1.009 03/19/2014 1200   PHURINE 5.0 12/29/2017 0003   GLUCOSEU 50 (A) 12/29/2017 0003   GLUCOSEU Negative 03/19/2014 1200   HGBUR LARGE (A) 12/29/2017 0003   BILIRUBINUR NEGATIVE 12/29/2017 0003   BILIRUBINUR Negative 03/19/2014 1200   KETONESUR 20 (A) 12/29/2017 0003   PROTEINUR 100 (A) 12/29/2017 0003   UROBILINOGEN 0.2 07/04/2014 1003   NITRITE NEGATIVE 12/29/2017 0003   LEUKOCYTESUR NEGATIVE 12/29/2017 0003   LEUKOCYTESUR Negative 03/19/2014 1200   Sepsis Labs: @LABRCNTIP (procalcitonin:4,lacticidven:4) )No results found for this or any previous visit (from the past 240 hour(s)).   Radiological Exams on Admission: Dg Chest 2 View  Result Date: 12/28/2017 CLINICAL DATA:  Altered mental status EXAM: CHEST - 2 VIEW COMPARISON:  07/06/2017 FINDINGS: Post sternotomy changes and valve prosthesis. Low lung volumes. Mild cardiomegaly. Aortic atherosclerosis. No pneumothorax. IMPRESSION: No active cardiopulmonary disease.  Low lung volumes. Electronically Signed   By: Jasmine Pang M.D.   On: 12/28/2017 23:18   Ct Abdomen Pelvis W Contrast  Result Date: 12/29/2017 CLINICAL DATA:  Patient found down. Fever and  suspected abscess. EXAM: CT ABDOMEN AND PELVIS WITH CONTRAST TECHNIQUE: Multidetector CT imaging of the abdomen and pelvis was  performed using the standard protocol following bolus administration of intravenous contrast. CONTRAST:  OMNIPAQUE IOHEXOL 300 MG/ML  SOLN COMPARISON:  None. FINDINGS: Lower chest: Motion artifact limits examination. Atelectasis in the lung bases. Postoperative changes in the mediastinum. Cardiac enlargement. Hepatobiliary: No focal liver lesions. Gallbladder wall is thickened and edematous with some stranding in the surrounding fat. This suggest cholecystitis. No stones are visualized. No bile duct dilatation. Pancreas: Fatty infiltration of the pancreas. No focal lesion or inflammatory changes appreciated. Spleen: Normal in size without focal abnormality. Adrenals/Urinary Tract: Adrenal glands are unremarkable. Kidneys are normal, without renal calculi, focal lesion, or hydronephrosis. Bladder is decompressed. Stomach/Bowel: Stomach, small bowel, and colon are not abnormally distended. No wall thickening or inflammatory infiltration are demonstrated. The appendix is normal. Vascular/Lymphatic: Aortic atherosclerosis. No enlarged abdominal or pelvic lymph nodes. Reproductive: Prostate is unremarkable. Other: No free air or free fluid in the abdomen or pelvis. No loculated fluid collections to suggest abscess. Abdominal wall musculature appears intact. Musculoskeletal: Degenerative changes in the lumbar spine. No destructive bone lesions. IMPRESSION: Gallbladder wall thickening and edema with surrounding infiltration suggesting acute cholecystitis. No stones are visualized. No evidence of bowel obstruction or inflammation. No abscess identified. Electronically Signed   By: Burman Nieves M.D.   On: 12/29/2017 01:26     EKG: Independently reviewed.  Atrial fibrillation, old right bundle blockage, QTC 493, early R wave progression  Assessment/Plan Principal Problem:   Acute  cholecystitis Active Problems:   Type II diabetes mellitus with renal manifestations (HCC)   Aortic stenosis   Atrial fibrillation, chronic   Essential hypertension   Dementia (HCC)   Normocytic anemia   Sepsis (HCC)   CKD (chronic kidney disease), stage III (HCC)   Chronic diastolic CHF (congestive heart failure) (HCC)   BPH (benign prostatic hyperplasia)   Acute metabolic encephalopathy   Hypokalemia   Elevated troponin   Sepsis due to acute cholecystitis: Patient is septic, with leukocytosis, fever, tachycardia, tachypnea.  Lactic acid is elevated.  Currently hemodynamically stable.  General surgeon, Dr. Lynann Bologna was consulted.  -will admit to SDU as inpt -IV zosyn (patient received 1 dose of vancomycin, cefepime, Flagyl before CT scan was done) -will get Procalcitonin and trend lactic acid levels per sepsis protocol. -IVF: 3L of NS bolus in ED, followed by 75cc/-->f/u BNP, if BNP is significantly elevated, will need to discontinue IV fluid, and then give bolus IVF as needed. -prn zofran for nasues and morphine for pain   Acute metabolic encephalopathy: Etiology is not clear, possibly due to sepsis and ongoing infection.  Patient moves all extremities, no focal neurologic findings. -Follow-up CT head -Frequent neuro check - UDS -Hold all oral medications onto mental status improves  Elevated troponin: Troponin 0.14, not sure if patient has any chest pain.  Possibly due to demand ischemia secondary to sepsis - cycle CE q6 x3 and repeat EKG in the am  - prn Morphine - Aspirin per rectum - hold statin until mental status improves - Risk factor stratification: will check FLP, UDS and A1C  - card consult was requested via Epic  Type II diabetes mellitus with renal manifestations (HCC): Last A1c 6.7 on 07/04/14, well controled. Patient is taking metformin at home -SSI -f/u A1c  Aortic stenosis:  -s/p of AVR with bovine valve  Atrial Fibrillation: CHA2DS2-VASc Score is 5, needs  oral anticoagulation, but patient is not on AC, possibly due to history of GI bleeding.  Heart rate 110s. -IV metoprolol 5 mg 3 times  daily with holding parameters -hold oral metoprolol until mental status improves  Essential hypertension: -On IV metoprolol - IV hydralazine as needed  Dementia (HCC): -hold home donepezil  Normocytic anemia: Hemoglobin stable, 10.2. -Follow-up with CBC  CKD (chronic kidney disease), stage III (HCC): Baseline creatinine 1.0-1.3.  His creatinine is 1.45, BUN 20, slightly worsening. - IV fluid as above -Follow-up renal function by BMP  Chronic diastolic CHF (congestive heart failure) (HCC): 2D echo on 07/04/2014 showed EF 65-70%.  Patient does not have leg edema.  No respiratory distress.  Chest x-ray no pulmonary edema.  CHF seems to be compensated. - Check BMP -Hold Lasix due to sepsis  BPH (benign prostatic hyperplasia): -Hold Flomax until mental status improves  Hypokalemia: K= 2.8 on admission. Mg 1.8 - Repleted K - 2 g of magnesium sulfate was given in ED    Inpatient status:  # Patient requires inpatient status due to high intensity of service, high risk for further deterioration and high frequency of surveillance required.  I certify that at the point of admission it is my clinical judgment that the patient will require inpatient hospital care spanning beyond 2 midnights from the point of admission.  . This patient has multiple chronic comorbidities including aortic valve stenosis, s/p AVR (bovine valve), DM-II, dementia, lumbar DDD, AAA, hypertension, hyperlipidemia, depression, BPH, atrial fibrillation not on anticoagulants, GI bleeding. . Now patient has presenting symptoms include altered mental status, abdominal tenderness. . The worrisome physical exam findings include Altered mental status, abdominal tenderness. . The initial radiographic and laboratory data are worrisome because of sepsis (leukocytosis, fever, tachycardia, tachypnea,  elevated lactic acid), hypokalemia, worsening renal function, elevated troponin.  CT scan showed cholecystitis. . Current medical needs: please see my assessment and plan   DVT ppx: SCD Code Status: Full code Family Communication: None at bed side.  Disposition Plan:  Anticipate discharge back to previous home environment Consults called: General surgeon, Dr. to Admission status:   SDU/inpation       Date of Service 12/29/2017    Lorretta Harp [facility] Pager [phone]  If 7PM-7AM, please contact night-coverage [email or web address] Password [email or web address] 12/29/2017, 3:41 AM

## 2017-12-29 NOTE — ED Notes (Signed)
Dr Nicanor Alcon informed of lactic acid results 3.12

## 2017-12-29 NOTE — ED Notes (Addendum)
Dr. Waymon Amato text paged that lactic acid resulted at 2.3. Awaiting return phoen call.

## 2017-12-29 NOTE — Consult Note (Signed)
Cardiology Consultation:   Patient ID: Peter Weiss MRN: [id number]; DOB: June 09, 1940  Admit date: 12/28/2017 Date of Consult: 12/29/2017  Primary Care Provider: Fleet Contras, MD Primary Cardiologist:  Mariah Milling  Primary Electrophysiologist:  None   Patient Profile:   Peter Weiss is a 77 y.o. male with a hx of aortic stenosis-status post aortic valve replacement, hypertension, atrial fibrillation, history of GI bleed   who is being seen today for the evaluation of elevated troponin levels at the request of  Dr. Waymon Amato.Marland Kitchen  History of Present Illness:   Peter Weiss 77 year old gentleman with a history of aortic valve replacement and hypertension.  He is followed by Dr. Mariah Milling. He was found at a motel today with altered mental status. He was incoherent.  He was shaking and yelling "help me".  He was brought to the [facility] emergency room.  CT scan revealed cholecystitis. Troponin  levels were found to be elevated. He is also been found to be positive for MRSA.     Past Medical History:  Diagnosis Date  . Aortic stenosis   . Arthritis   . CHF (congestive heart failure) (HCC)   . Chicken pox   . Depression   . Diabetes mellitus without complication (HCC)   . Heart murmur   . History of fainting spells of unknown cause   . Hyperlipidemia   . Hypertension   . Neuromuscular disorder (HCC)   . Urinary incontinence     Past Surgical History:  Procedure Laterality Date  . AORTIC VALVE REPLACEMENT N/A 04/26/2014   Procedure: AORTIC VALVE REPLACEMENT (AVR);  Surgeon: Loreli Slot, MD;  Location: [facility] OR;  Service: Open Heart Surgery;  Laterality: N/A;  . CARDIAC CATHETERIZATION  03/2014  . ESOPHAGOGASTRODUODENOSCOPY N/A 07/04/2014   Procedure: ESOPHAGOGASTRODUODENOSCOPY (EGD);  Surgeon: Jeani Hawking, MD;  Location: [facility]COPY;  Service: Endoscopy;  Laterality: N/A;  . goiter  2008  . MAZE N/A 04/26/2014   Procedure: MAZE;  Surgeon: Loreli Slot,  MD;  Location: [facility] OR;  Service: Open Heart Surgery;  Laterality: N/A;  . MULTIPLE EXTRACTIONS WITH ALVEOLOPLASTY N/A 04/24/2014   Procedure: Extraction of tooth #'s 2,3,5,12,13,14,15,20 with allveoloplasty and gross debridement of remaining dentition and sectioning of bridge at mesial of abutment tooth #32.;  Surgeon: Charlynne Pander, DDS;  Location: [facility] OR;  Service: Oral Surgery;  Laterality: N/A;  . TEE WITHOUT CARDIOVERSION N/A 04/26/2014   Procedure: TRANSESOPHAGEAL ECHOCARDIOGRAM (TEE);  Surgeon: Loreli Slot, MD;  Location: [facility] OR;  Service: Open Heart Surgery;  Laterality: N/A;     Home Medications:  Prior to Admission medications   Medication Sig Start Date End Date Taking? Authorizing Provider  donepezil (ARICEPT) 10 MG tablet Take 1 tablet (10 mg total) by mouth at bedtime. 07/09/17   Burnadette Pop, MD  furosemide (LASIX) 20 MG tablet Take 20 mg by mouth daily as needed (leg swelling).    [provider]  gabapentin (NEURONTIN) 400 MG capsule Take 400 mg by mouth 4 (four) times daily.     [provider]  ibuprofen (ADVIL,MOTRIN) 200 MG tablet Take 200 mg by mouth every 6 (six) hours as needed for moderate pain.    [provider]  metFORMIN (GLUCOPHAGE) 500 MG tablet Take 500 mg by mouth 2 (two) times daily with a meal.    [provider]  metoprolol tartrate (LOPRESSOR) 25 MG tablet Take 37.5 mg by mouth 2 (two) times daily.     [provider]  potassium  chloride (K-DUR) 10 MEQ tablet Take 10 mEq by mouth daily.    [provider]  sertraline (ZOLOFT) 50 MG tablet Take 1 tablet (50 mg total) by mouth daily. 07/31/14   Antonieta Iba, MD  simvastatin (ZOCOR) 20 MG tablet Take 1 tablet (20 mg total) by mouth at bedtime. 07/09/17   Burnadette Pop, MD  tamsulosin (FLOMAX) 0.4 MG CAPS capsule Take 1 capsule (0.4 mg total) by mouth daily. 07/31/14   Antonieta Iba, MD  tiZANidine (ZANAFLEX) 4 MG tablet Take 4 mg by mouth 2  (two) times daily as needed for muscle spasms.    [provider]    Inpatient Medications: Scheduled Meds: . aspirin  300 mg Rectal Daily  . folic acid  1 mg Oral Daily  . insulin aspart  0-5 Units Subcutaneous QHS  . insulin aspart  0-9 Units Subcutaneous TID WC  . metoprolol tartrate  5 mg Intravenous Q8H  . multivitamin with minerals  1 tablet Oral Daily  . [START ON 12/30/2017] thiamine injection  100 mg Intravenous Daily   Continuous Infusions: . 0.9 % NaCl with KCl 40 mEq / L 75 mL/hr (12/29/17 0904)  . magnesium sulfate 1 - 4 g bolus IVPB 2 g (12/29/17 1231)  . piperacillin-tazobactam (ZOSYN)  IV 3.375 g (12/29/17 1232)  . vancomycin     PRN Meds: acetaminophen **OR** acetaminophen, hydrALAZINE, LORazepam, morphine injection, ondansetron **OR** ondansetron (ZOFRAN) IV  Allergies:   No Known Allergies  Social History:   Social History   Socioeconomic History  . Marital status: Divorced    Spouse name: Not on file  . Number of children: Not on file  . Years of education: Not on file  . Highest education level: Not on file  Occupational History  . Not on file  Social Needs  . Financial resource strain: Not on file  . Food insecurity:    Worry: Not on file    Inability: Not on file  . Transportation needs:    Medical: Not on file    Non-medical: Not on file  Tobacco Use  . Smoking status: Never Smoker  . Smokeless tobacco: Never Used  Substance and Sexual Activity  . Alcohol use: Yes    Alcohol/week: 0.0 standard drinks  . Drug use: Yes    Types: Marijuana    Comment: once a month  . Sexual activity: Not Currently  Lifestyle  . Physical activity:    Days per week: Not on file    Minutes per session: Not on file  . Stress: Not on file  Relationships  . Social connections:    Talks on phone: Not on file    Gets together: Not on file    Attends religious service: Not on file    Active member of club or organization: Not on file    Attends  meetings of clubs or organizations: Not on file    Relationship status: Not on file  . Intimate partner violence:    Fear of current or ex partner: Not on file    Emotionally abused: Not on file    Physically abused: Not on file    Forced sexual activity: Not on file  Other Topics Concern  . Not on file  Social History Narrative   ** Merged History Encounter **       ** Merged History Encounter **        Family History:    Family History  Problem Relation Age of Onset  .  Alcohol abuse Mother   . Heart disease Mother   . Diabetes Mother   . Heart disease Father   . Diabetes Father   . Cancer Mother   . Cancer Father   . Alcohol abuse Father   . Alcohol abuse Brother      ROS:  Please see the history of present illness.   All other ROS reviewed and negative.     Physical Exam/Data:   Vitals:   12/29/17 0930 12/29/17 1030 12/29/17 1100 12/29/17 1200  BP: 135/76 (!) 146/91 138/87 (!) 137/92  Pulse: (!) 101 (!) 104 (!) 110 (!) 107  Resp: (!) 23 (!) 23 (!) 23 (!) 24  Temp:      TempSrc:      SpO2: 95% 97% 96% 97%  Weight:      Height:        Intake/Output Summary (Last 24 hours) at 12/29/2017 1258 Last data filed at 12/29/2017 1228 Gross per 24 hour  Intake 4185 ml  Output 375 ml  Net 3810 ml   Filed Weights   12/28/17 2225  Weight: 114.6 kg   Body mass index is 33.33 kg/m.  General: Elderly gentleman.  Appears to be chronically ill.  He is very pale. HEENT: Thick beard Lymph: no adenopathy Neck: Beard, difficult to assess JVD. Endocrine:  No thryomegaly Vascular: No carotid bruits; FA pulses 2+ bilaterally without bruits  Cardiac:    Regular rate, distant heart sounds Lungs:  clear to auscultation bilaterally, no wheezing, rhonchi or rales  Abd: Poor bowel sounds.  Tender. Ext: Legs were extremely pale and mottled.  Pulses were obtained via the Doppler. Musculoskeletal:  No deformities, BUE and BLE strength normal and equal Skin: warm and dry    Neuro: Unable to assess.  He would cry out in pain occasionally.  Moves both arms regularly.  Would not open eyes.  Would not cooperate with any exam. Psych: Unable to assess.  EKG:  The EKG was personally reviewed and demonstrates: Sinus tachycardia Telemetry:  Telemetry was personally reviewed and demonstrates: Tachycardia.  No ST or T wave changes.  Relevant CV Studies:   Laboratory Data:  Chemistry Recent Labs  Lab 12/28/17 2232 12/29/17 0254 12/29/17 0753  NA 135 137 136  K 2.8* 2.8* 3.5  CL 96* 106 107  CO2 21* 15* 17*  GLUCOSE 227* 220* 255*  BUN 20 21 23   CREATININE 1.45* 1.40* 1.48*  CALCIUM 8.8* 7.7* 7.5*  GFRNONAA 46* 48* 45*  GFRAA 53* 55* 52*  ANIONGAP 18* 16* 12    Recent Labs  Lab 12/28/17 2232 12/29/17 0254 12/29/17 0753  PROT 7.3 5.8* 6.1*  ALBUMIN 3.3* 2.7* 2.7*  AST 23 24 34  ALT 13 12 15   ALKPHOS 45 33* 35*  BILITOT 1.9* 2.0* 1.9*   Hematology Recent Labs  Lab 12/28/17 2232 12/29/17 0254 12/29/17 0753  WBC 14.0* 10.7* 13.7*  RBC 3.71* 3.16* 3.38*  HGB 10.2* 8.5* 9.0*  HCT 33.0* 28.1* 30.6*  MCV 88.9 88.9 90.5  MCH 27.5 26.9 26.6  MCHC 30.9 30.2 29.4*  RDW 15.9* 15.9* 16.2*  PLT 166 120* 95*   Cardiac Enzymes Recent Labs  Lab 12/28/17 2318 12/29/17 0254 12/29/17 0753  TROPONINI 0.14* 0.26* 0.24*   No results for input(s): TROPIPOC in the last 168 hours.  BNP Recent Labs  Lab 12/29/17 0254  BNP 1,059.0*    DDimer No results for input(s): DDIMER in the last 168 hours.  Radiology/Studies:  Dg Chest 2 View  Result Date: 12/28/2017 CLINICAL DATA:  Altered mental status EXAM: CHEST - 2 VIEW COMPARISON:  07/06/2017 FINDINGS: Post sternotomy changes and valve prosthesis. Low lung volumes. Mild cardiomegaly. Aortic atherosclerosis. No pneumothorax. IMPRESSION: No active cardiopulmonary disease.  Low lung volumes. Electronically Signed   By: Jasmine Pang M.D.   On: 12/28/2017 23:18   Ct Head Wo Contrast  Result Date:  12/29/2017 CLINICAL DATA:  77 y/o M; found in bed covered urine. Altered level of consciousness, unexplained. EXAM: CT HEAD WITHOUT CONTRAST TECHNIQUE: Contiguous axial images were obtained from the base of the skull through the vertex without intravenous contrast. COMPARISON:  06/04/2017 CT head. FINDINGS: Brain: No evidence of acute infarction, hemorrhage, hydrocephalus, extra-axial collection or mass lesion/mass effect of brain parenchyma. 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 The pituitary mass abuts the optic chiasm and pre chiasmatic optic nerves without significant displacement. Stable small chronic infarction within the left frontal operculum, chronic microvascular ischemic changes of the brain, and volume loss. Vascular: Foci of air present throughout the venous structures of the face and the cavernous sinus, likely after Janik. Calcific atherosclerosis of carotid siphons. No hyperdense vessel identified. Skull: Normal. Negative for fracture or focal lesion. Sinuses/Orbits: Mild mucosal thickening of the maxillary sinuses and the frontal sinuses. Small maxillary sinus mucous retention cyst. Normal aeration of the mastoid air cells. Other: None. IMPRESSION: 1. No acute intracranial abnormality identified. 2. Stable mass within the pituitary fossa with mild mass effect on the optic chiasm and pre chiasmatic optic nerves. 3. Stable small chronic infarction in left frontal operculum, chronic microvascular ischemic changes of the brain, and volume loss. Electronically Signed   By: Mitzi Hansen M.D.   On: 12/29/2017 03:58   Ct Abdomen Pelvis W Contrast  Result Date: 12/29/2017 CLINICAL DATA:  Patient found down. Fever and suspected abscess. EXAM: CT ABDOMEN AND PELVIS WITH CONTRAST TECHNIQUE: Multidetector CT imaging of the abdomen and pelvis was performed using the standard protocol following bolus administration of intravenous contrast. CONTRAST:   OMNIPAQUE IOHEXOL 300 MG/ML  SOLN COMPARISON:  None. FINDINGS: Lower chest: Motion artifact limits examination. Atelectasis in the lung bases. Postoperative changes in the mediastinum. Cardiac enlargement. Hepatobiliary: No focal liver lesions. Gallbladder wall is thickened and edematous with some stranding in the surrounding fat. This suggest cholecystitis. No stones are visualized. No bile duct dilatation. Pancreas: Fatty infiltration of the pancreas. No focal lesion or inflammatory changes appreciated. Spleen: Normal in size without focal abnormality. Adrenals/Urinary Tract: Adrenal glands are unremarkable. Kidneys are normal, without renal calculi, focal lesion, or hydronephrosis. Bladder is decompressed. Stomach/Bowel: Stomach, small bowel, and colon are not abnormally distended. No wall thickening or inflammatory infiltration are demonstrated. The appendix is normal. Vascular/Lymphatic: Aortic atherosclerosis. No enlarged abdominal or pelvic lymph nodes. Reproductive: Prostate is unremarkable. Other: No free air or free fluid in the abdomen or pelvis. No loculated fluid collections to suggest abscess. Abdominal wall musculature appears intact. Musculoskeletal: Degenerative changes in the lumbar spine. No destructive bone lesions. IMPRESSION: Gallbladder wall thickening and edema with surrounding infiltration suggesting acute cholecystitis. No stones are visualized. No evidence of bowel obstruction or inflammation. No abscess identified. Electronically Signed   By: Burman Nieves M.D.   On: 12/29/2017 01:26    Assessment and Plan:   1. Elevated troponin level: The patient has an elevated troponin level.  It is impossible to assess his clinical status.  He is not cooperative with the exam. His EKG does not look acute.  We will get an echocardiogram for assessment of his left ventricular function. At present he is not a candidate for invasive procedures. He has MRSA bacteremia.  It is possible  that his positive troponin levels might be associated with sepsis syndrome.  2.  Pallor: The patient's hemoglobin is 9.  There was no mention of aortic dissection on the abdominal CT scan.  Should be noted that it was not a contrast angiogram but the lumen of the aorta appears to be patent.  2.  History of aortic stenosis: Status post aortic valve replacement  4.  Cholecystitis: The patient apparently has cholecystitis.  It is impossible to assess him clinically but he has evidence of cholecystitis on CT scan.  Further plans per the surgery team.       For questions or updates, please contact [facility] Please consult [email or web address] for contact info under     Signed, Kristeen Miss, MD  12/29/2017 12:58 PM

## 2017-12-29 NOTE — Progress Notes (Signed)
Pharmacy Antibiotic Note  Peter Weiss is a 77 y.o. male admitted on 12/28/2017 with AMS and concern for sepsis. Work up has revealed acute cholecystis and multiple skin wounds at different stages of healing. Now with 2/2 BCx growing MRSA (updated since BCID call was made). Pharmacy has been consulted to dose Vancomycin to cover for the MRSA bacteremia and to keep Zosyn on board for cholecystitis coverage.   ID to see later today and can make more appropriate antibiotic recommendations upon further evaluation.  Vancomycin 1500 mg IV x 1 given on 10/16 @ 2348. SCr 1.48, nCrCl~40-45 ml/min. Noted that the loading dose was lower than current weight - so will start the maintenance dose early to accommodate for this.   Plan: - Vancomycin 1250 mg IV every 24 hours (next dose due at 1800) - Cont Zosyn 3.375g IV every 8 hours for now - Will continue to follow renal function, culture results, LOT, and antibiotic de-escalation plans   Height: 6\' 1"  (185.4 cm) Weight: 252 lb 10.4 oz (114.6 kg) IBW/kg (Calculated) : 79.9  Temp (24hrs), Avg:102.1 F (38.9 C), Min:100.6 F (38.1 C), Max:104 F (40 C)  Recent Labs  Lab 12/28/17 2232 12/28/17 2242 12/29/17 0004 12/29/17 0254 12/29/17 0531 12/29/17 0753  WBC 14.0*  --   --  10.7*  --  13.7*  CREATININE 1.45*  --   --  1.40*  --  1.48*  LATICACIDVEN  --  2.50* 3.12* 2.1* 2.3*  --     Estimated Creatinine Clearance: 57.2 mL/min (A) (by C-G formula based on SCr of 1.48 mg/dL (H)).    No Known Allergies  Antimicrobials this admission: Cefepime 10/16 x 1 Vanc 10/16 >> Zosyn 10/16 >>  Dose adjustments this admission: N/a  Microbiology results: 10/17 BCx >> 2/2 MRSA 10/17 UCx >>  Thank you for allowing pharmacy to be a part of this patient's care.  Georgina Pillion, PharmD, BCPS Clinical Pharmacist Pager: [phone] Clinical phone for 12/29/2017 from 7a-3:30p: [phone] If after 3:30p, please call main pharmacy at: [phone] Please  check AMION for all [facility] numbers 12/29/2017 11:17 AM

## 2017-12-29 NOTE — Progress Notes (Addendum)
PROGRESS NOTE   Peter Weiss  FWY:[id number]    DOB: 1940/07/13    DOA: 12/28/2017  PCP: Nolene Ebbs, MD   I have briefly reviewed patients previous medical records in [facility].  Brief Narrative:  77 year old male reportedly lives at an extended stay hotel off of [facility] reportedly independent with his ADLs at baseline (normally alert and oriented and walks his dog daily), PMH of aortic valve stenosis, status post AVR (bovine valve), A. fib not on anticoagulation, chronic diastolic CHF, DM 2, dementia, lumbar DDD, AAA, HTN, HLD, depression, BPH, GI bleeding, alcohol use, EMS was called out by security who had to use forced entry to enter patient's room where he was found in bed covered in urine and brought to ED for altered mental status.  He was unable to provide any history in the ED.  He was admitted for MRSA bacteremia/sepsis, suspected acute cholecystitis and acute encephalopathy.  Cardiology consulted due to elevated troponins.  General surgery/IR consulted for acute cholecystitis.  Automatic ID consultation for MRSA bacteremia.   Assessment & Plan:   Principal Problem:   Acute cholecystitis Active Problems:   Type II diabetes mellitus with renal manifestations (HCC)   Aortic stenosis   Atrial fibrillation, chronic   Essential hypertension   Dementia (HCC)   Normocytic anemia   Sepsis (HCC)   CKD (chronic kidney disease), stage III (HCC)   Chronic diastolic CHF (congestive heart failure) (HCC)   BPH (benign prostatic hyperplasia)   Acute metabolic encephalopathy   Hypokalemia   Elevated troponin   Sepsis due to MRSA bacteremia and suspected acute cholecystitis Blood cultures x2 and BCID show MRSA.  Treated per sepsis protocol with IV fluids.  Continue empirically started IV Zosyn and vancomycin.  Met sepsis criteria on admission.  Remains mildly tachycardic and tachypneic but blood pressure okay.  MRSA bacteremia Could be due to skin  wounds at different stages of healing.  IV vancomycin.  ID consulted and input pending.  Suspected acute acalculous cholecystitis CT abdomen and pelvis with contrast 10/17: Gallbladder wall thickening and edema with surrounding infiltration suggesting acute cholecystitis.  General surgery input appreciated.  Given his altered mental status/agitation, elevated troponins, underlying CHF, was not felt to be a good candidate for surgical intervention.  They consulted IR who recommended obtaining HIDA scan for further evaluation. NPO except sips for meds.  Continue IV Zosyn.  Acute toxic metabolic encephalopathy Exact etiology not clear but could be related to sepsis.  No acute intracranial abnormality noted.  No focal deficits.  Delirium precautions.  Alcohol use listed but amounts not known.  UDS negative.  Check ammonia level.  Received Haldol 4 mg IV x1 earlier this morning but avoid QT prolonging medications due to prolonged QTC. CIWA protocol.  Discussed with CCM MD on call and alerted them that if agitation not controlled, may need ICU admission and Precedex drip.  Monitor closely.  Safety sitter requested.  A. fib with mild RVR CHA2DS2-VASc Score is 5.  Patient reportedly not anticoagulation candidate due to history of GI bleed.  Since NPO/AMS, continue IV metoprolol 5 mg 3 times daily.  Monitor on telemetry.  Elevated troponin: No report of chest pain.  Mild increased from 0.16-0.26 but plateaued thereafter.  Could be demand ischemia from sepsis.  Cardiology consulted but likely supportive care.  Continue rectal aspirin.  Aortic stenosis status post AVR  DM 2 with renal manifestations A1c 7.1.  Continue SSI with close monitoring.  Essential  hypertension Mildly uncontrolled.  Continue scheduled IV metoprolol and PRN IV hydralazine.  Dementia Holding home Aricept.  Normocytic anemia Hemoglobin stable.  Follow CBCs.  Thrombocytopenia Could be related to sepsis.  No bleeding reported.   Follow CBCs.  Acute on stage III chronic kidney disease Baseline creatinine not clear but may be in the 1-1.3 range.  Presented with creatinine of 1.4 and stable in that range.  Follow daily BMP.  Chronic diastolic CHF: TTE 10/26/4816 showed LVEF 65-70%.  Clinically dehydrated and not volume overloaded.  Hold Lasix.  Monitor closely with gentle IV fluids for sepsis.  BPH Holding Flomax.  Foley catheter placed in ED.  Hypokalemia Potassium 2.8 on admission.  Replaced and better.  Continue replacing and IV fluid.  Follow BMP in a.m.  Hypomagnesemia Magnesium 1.2 on admission.  Improved after replacing.  Continue replacing IV and follow magnesium in a.m.  Metabolic acidosis/elevated lactate Peak lactate 3.12.  Improved to 2.3.  Continue IV fluids.  Metabolic acidosis improving  Prolonged QTC Replace hypokalemia and hypomagnesemia.  Monitor on telemetry.  Avoid QT prolonging medications.  EKG 12/29/2017: RBBB, QTC 533 (not sure if accurate due to BBB).  Stable mass within the pituitary fossa:  Noted on CT head 10/17.  Mild mass-effect on the optic chiasm and prechiasmatic optic nerves.  Unclear etiology.  Outpatient follow-up.  Obesity/Body mass index is 33.33 kg/m.    DVT prophylaxis: SCDs Code Status: Full Family Communication: Attempted to reach listed contacts without success. Disposition: To be determined.  Admit to stepdown unit for close monitoring and management.   Consultants:  General surgery Interventional radiology Cardiology Infectious disease  Procedures:  Foley catheter 10/17 >  Antimicrobials:  IV vancomycin and Zosyn 10/17 >   Subjective: Patient seen in ED early this morning.  Waxing and waning mental status with intermittent agitation and keeps yelling out "help me, help me" without elaborating.  No pain reported and does not appear in any discomfort.  Unable to obtain any history from him and unable to reach known contacts.  ROS:  Unable.  Objective:  Vitals:   12/29/17 0930 12/29/17 1030 12/29/17 1100 12/29/17 1200  BP: 135/76 (!) 146/91 138/87 (!) 137/92  Pulse: (!) 101 (!) 104 (!) 110 (!) 107  Resp: (!) 23 (!) 23 (!) 23 (!) 24  Temp:      TempSrc:      SpO2: 95% 97% 96% 97%  Weight:      Height:        Examination:  General exam: Elderly male, moderately built and obese, lying comfortably propped up in bed with intermittent agitation and attempts to get out of bed. Respiratory system: Clear to auscultation. Respiratory effort normal.  Midline sternotomy scar. Cardiovascular system: S1 & S2 heard, irregularly irregular. No JVD, murmurs, rubs, gallops or clicks. No pedal edema.  Telemetry personally reviewed: A. fib with BBB morphology, rate ranging between 90s to 110s. Gastrointestinal system: Abdomen is nondistended but obese, soft and nontender. No organomegaly or masses felt. Normal bowel sounds heard. Central nervous system: Somnolent but intermittently wakes up and is agitated.  Not oriented.  Does not follow instructions. No focal neurological deficits. Extremities: Symmetric 5 x 5 power.  All limbs symmetrically and actively. Skin: Generalized multiple skin ulcers at different stages of healing but none appear acutely infected. Psychiatry: Judgement and insight impaired. Mood & affect confused with intermittent agitation. Neck: supple.     Data Reviewed: I have personally reviewed following labs and imaging studies  CBC:  Recent Labs  Lab 12/28/17 2232 12/29/17 0254 12/29/17 0753  WBC 14.0* 10.7* 13.7*  NEUTROABS 13.2*  --   --   HGB 10.2* 8.5* 9.0*  HCT 33.0* 28.1* 30.6*  MCV 88.9 88.9 90.5  PLT 166 120* 95*   Basic Metabolic Panel: Recent Labs  Lab 12/28/17 2232 12/28/17 2318 12/29/17 0254 12/29/17 0753  NA 135  --  137 136  K 2.8*  --  2.8* 3.5  CL 96*  --  106 107  CO2 21*  --  15* 17*  GLUCOSE 227*  --  220* 255*  BUN 20  --  21 23  CREATININE 1.45*  --  1.40* 1.48*   CALCIUM 8.8*  --  7.7* 7.5*  MG  --  1.2*  --  1.6*   Liver Function Tests: Recent Labs  Lab 12/28/17 2232 12/29/17 0254 12/29/17 0753  AST 23 24 34  ALT 13 12 15   ALKPHOS 45 33* 35*  BILITOT 1.9* 2.0* 1.9*  PROT 7.3 5.8* 6.1*  ALBUMIN 3.3* 2.7* 2.7*   Coagulation Profile: Recent Labs  Lab 12/28/17 2318  INR 1.59   Cardiac Enzymes: Recent Labs  Lab 12/28/17 2318 12/29/17 0254 12/29/17 0753  TROPONINI 0.14* 0.26* 0.24*   HbA1C: Recent Labs    12/29/17 0254  HGBA1C 7.1*   CBG: Recent Labs  Lab 12/28/17 2219 12/29/17 0907  GLUCAP 197* 241*    Recent Results (from the past 240 hour(s))  Blood Culture (routine x 2)     Status: None (Preliminary result)   Collection Time: 12/28/17 10:32 PM  Result Value Ref Range Status   Specimen Description BLOOD RIGHT ANTECUBITAL  Final   Special Requests   Final    BOTTLES DRAWN AEROBIC AND ANAEROBIC Blood Culture results may not be optimal due to an excessive volume of blood received in culture bottles   Culture  Setup Time   Final    GRAM POSITIVE COCCI IN BOTH AEROBIC AND ANAEROBIC BOTTLES Organism ID to follow CRITICAL RESULT CALLED TO, READ BACK BY AND VERIFIED WITH: Ferne Coe PharmD 10:45 12/29/17 ([email or web address]) Performed at [facility], [place] [address], [place], [place] [address]    Culture GRAM POSITIVE COCCI  Final   Report Status PENDING  Incomplete  Blood Culture ID Panel (Reflexed)     Status: Abnormal   Collection Time: 12/28/17 10:32 PM  Result Value Ref Range Status   Enterococcus species NOT DETECTED NOT DETECTED Final   Listeria monocytogenes NOT DETECTED NOT DETECTED Final   Staphylococcus species DETECTED (A) NOT DETECTED Final    Comment: CRITICAL RESULT CALLED TO, READ BACK BY AND VERIFIED WITH: Ferne Coe PharmD 10:45 12/29/17 ([email or web address])    Staphylococcus aureus (BCID) DETECTED (A) NOT DETECTED Final    Comment: Methicillin (oxacillin)-resistant Staphylococcus aureus (MRSA). MRSA is  predictably resistant to beta-lactam antibiotics (except ceftaroline). Preferred therapy is vancomycin unless clinically contraindicated. Patient requires contact precautions if  hospitalized. CRITICAL RESULT CALLED TO, READ BACK BY AND VERIFIED WITH: Ferne Coe PharmD 10:45 12/29/17 ([email or web address])    Methicillin resistance DETECTED (A) NOT DETECTED Final    Comment: CRITICAL RESULT CALLED TO, READ BACK BY AND VERIFIED WITH: Ferne Coe PharmD 10:45 12/29/17 ([email or web address])    Streptococcus species NOT DETECTED NOT DETECTED Final   Streptococcus agalactiae NOT DETECTED NOT DETECTED Final   Streptococcus pneumoniae NOT DETECTED NOT DETECTED Final   Streptococcus pyogenes NOT DETECTED NOT DETECTED Final   Acinetobacter baumannii NOT DETECTED NOT DETECTED Final  Enterobacteriaceae species NOT DETECTED NOT DETECTED Final   Enterobacter cloacae complex NOT DETECTED NOT DETECTED Final   Escherichia coli NOT DETECTED NOT DETECTED Final   Klebsiella oxytoca NOT DETECTED NOT DETECTED Final   Klebsiella pneumoniae NOT DETECTED NOT DETECTED Final   Proteus species NOT DETECTED NOT DETECTED Final   Serratia marcescens NOT DETECTED NOT DETECTED Final   Haemophilus influenzae NOT DETECTED NOT DETECTED Final   Neisseria meningitidis NOT DETECTED NOT DETECTED Final   Pseudomonas aeruginosa NOT DETECTED NOT DETECTED Final   Candida albicans NOT DETECTED NOT DETECTED Final   Candida glabrata NOT DETECTED NOT DETECTED Final   Candida krusei NOT DETECTED NOT DETECTED Final   Candida parapsilosis NOT DETECTED NOT DETECTED Final   Candida tropicalis NOT DETECTED NOT DETECTED Final    Comment: Performed at [facility], [place] [address] [place], [place] [address]  Blood Culture (routine x 2)     Status: None (Preliminary result)   Collection Time: 12/28/17 11:18 PM  Result Value Ref Range Status   Specimen Description BLOOD RIGHT HAND  Final   Special Requests   Final    BOTTLES DRAWN AEROBIC AND ANAEROBIC Blood  Culture results may not be optimal due to an inadequate volume of blood received in culture bottles   Culture  Setup Time   Final    GRAM POSITIVE COCCI IN CLUSTERS IN BOTH AEROBIC AND ANAEROBIC BOTTLES Performed at [facility], [place] [address], [place], [place] [address]    Culture [facility] POSITIVE COCCI  Final   Report Status PENDING  Incomplete         Radiology Studies: Dg Chest 2 View  Result Date: 12/28/2017 CLINICAL DATA:  Altered mental status EXAM: CHEST - 2 VIEW COMPARISON:  07/06/2017 FINDINGS: Post sternotomy changes and valve prosthesis. Low lung volumes. Mild cardiomegaly. Aortic atherosclerosis. No pneumothorax. IMPRESSION: No active cardiopulmonary disease.  Low lung volumes. Electronically Signed   By: Donavan Foil M.D.   On: 12/28/2017 23:18   Ct Head Wo Contrast  Result Date: 12/29/2017 CLINICAL DATA:  77 y/o M; found in bed covered urine. Altered level of consciousness, unexplained. EXAM: CT HEAD WITHOUT CONTRAST TECHNIQUE: Contiguous axial images were obtained from the base of the skull through the vertex without intravenous contrast. COMPARISON:  06/04/2017 CT head. FINDINGS: Brain: No evidence of acute infarction, hemorrhage, hydrocephalus, extra-axial collection or mass lesion/mass effect of brain parenchyma. 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 The pituitary mass abuts the optic chiasm and pre chiasmatic optic nerves without significant displacement. Stable small chronic infarction within the left frontal operculum, chronic microvascular ischemic changes of the brain, and volume loss. Vascular: Foci of air present throughout the venous structures of the face and the cavernous sinus, likely after Janik. Calcific atherosclerosis of carotid siphons. No hyperdense vessel identified. Skull: Normal. Negative for fracture or focal lesion. Sinuses/Orbits: Mild mucosal thickening of the maxillary sinuses and the frontal sinuses.  Small maxillary sinus mucous retention cyst. Normal aeration of the mastoid air cells. Other: None. IMPRESSION: 1. No acute intracranial abnormality identified. 2. Stable mass within the pituitary fossa with mild mass effect on the optic chiasm and pre chiasmatic optic nerves. 3. Stable small chronic infarction in left frontal operculum, chronic microvascular ischemic changes of the brain, and volume loss. Electronically Signed   By: Kristine Garbe M.D.   On: 12/29/2017 03:58   Ct Abdomen Pelvis W Contrast  Result Date: 12/29/2017 CLINICAL DATA:  Patient found down. Fever and suspected abscess. EXAM: CT ABDOMEN AND PELVIS WITH CONTRAST TECHNIQUE: Multidetector CT imaging of the abdomen and pelvis was performed using the standard protocol following bolus administration of intravenous contrast. CONTRAST:  148m OMNIPAQUE IOHEXOL 300 MG/ML  SOLN COMPARISON:  None. FINDINGS: Lower chest: Motion artifact limits examination. Atelectasis in the lung bases. Postoperative changes in the mediastinum. Cardiac enlargement. Hepatobiliary: No focal liver lesions. Gallbladder wall is thickened and edematous with some stranding in the surrounding fat. This suggest cholecystitis. No stones are visualized. No bile duct dilatation. Pancreas: Fatty infiltration of the pancreas. No focal lesion or inflammatory changes appreciated. Spleen: Normal in size without focal abnormality. Adrenals/Urinary Tract: Adrenal glands are unremarkable. Kidneys are normal, without renal calculi, focal lesion, or hydronephrosis. Bladder is decompressed. Stomach/Bowel: Stomach, small bowel, and colon are not abnormally distended. No wall thickening or inflammatory infiltration are demonstrated. The appendix is normal. Vascular/Lymphatic: Aortic atherosclerosis. No enlarged abdominal or pelvic lymph nodes. Reproductive: Prostate is unremarkable. Other: No free air or free fluid in the abdomen or pelvis. No loculated fluid collections to  suggest abscess. Abdominal wall musculature appears intact. Musculoskeletal: Degenerative changes in the lumbar spine. No destructive bone lesions. IMPRESSION: Gallbladder wall thickening and edema with surrounding infiltration suggesting acute cholecystitis. No stones are visualized. No evidence of bowel obstruction or inflammation. No abscess identified. Electronically Signed   By: WLucienne CapersM.D.   On: 12/29/2017 01:26        Scheduled Meds: . aspirin  300 mg Rectal Daily  . folic acid  1 mg Oral Daily  . insulin aspart  0-5 Units Subcutaneous QHS  . insulin aspart  0-9 Units Subcutaneous TID WC  . metoprolol tartrate  5 mg Intravenous Q8H  . multivitamin with minerals  1 tablet Oral Daily  . [START ON 12/30/2017] thiamine injection  100 mg Intravenous Daily   Continuous Infusions: . 0.9 % NaCl with KCl 40 mEq / L 75 mL/hr (12/29/17 0904)  . magnesium sulfate 1 - 4 g bolus IVPB    . piperacillin-tazobactam (ZOSYN)  IV Stopped (12/29/17 0730)  . vancomycin       LOS: 0 days     AVernell Leep MD, FACP, [facility] [place] [facility] Pager [phone] If 7PM-7AM, please contact night-coverage [email or web address] Password [email or web address] 12/29/2017, 12:16 PM

## 2017-12-29 NOTE — ED Notes (Signed)
Dr Nicanor Alcon informed of venous blood gas results

## 2017-12-30 ENCOUNTER — Inpatient Hospital Stay (HOSPITAL_COMMUNITY): Payer: Medicare Other

## 2017-12-30 ENCOUNTER — Other Ambulatory Visit (HOSPITAL_COMMUNITY): Payer: Self-pay

## 2017-12-30 DIAGNOSIS — R710 Precipitous drop in hematocrit: Secondary | ICD-10-CM

## 2017-12-30 DIAGNOSIS — R5383 Other fatigue: Secondary | ICD-10-CM

## 2017-12-30 DIAGNOSIS — R7881 Bacteremia: Secondary | ICD-10-CM

## 2017-12-30 LAB — COMPREHENSIVE METABOLIC PANEL
ALBUMIN: 2.3 g/dL — AB (ref 3.5–5.0)
ALT: 15 U/L (ref 0–44)
ANION GAP: 5 (ref 5–15)
AST: 26 U/L (ref 15–41)
Alkaline Phosphatase: 34 U/L — ABNORMAL LOW (ref 38–126)
BUN: 34 mg/dL — ABNORMAL HIGH (ref 8–23)
CHLORIDE: 118 mmol/L — AB (ref 98–111)
CO2: 20 mmol/L — AB (ref 22–32)
Calcium: 7.5 mg/dL — ABNORMAL LOW (ref 8.9–10.3)
Creatinine, Ser: 1.51 mg/dL — ABNORMAL HIGH (ref 0.61–1.24)
GFR calc Af Amer: 50 mL/min — ABNORMAL LOW (ref >60)
GFR calc non Af Amer: 43 mL/min — ABNORMAL LOW (ref >60)
GLUCOSE: 157 mg/dL — AB (ref 70–99)
POTASSIUM: 3.6 mmol/L (ref 3.5–5.1)
SODIUM: 143 mmol/L (ref 135–145)
Total Bilirubin: 1 mg/dL (ref 0.3–1.2)
Total Protein: 5.3 g/dL — ABNORMAL LOW (ref 6.5–8.1)

## 2017-12-30 LAB — CBC
HCT: 25.5 % — ABNORMAL LOW (ref 39.0–52.0)
HEMOGLOBIN: 7.8 g/dL — AB (ref 13.0–17.0)
MCH: 27.4 pg (ref 26.0–34.0)
MCHC: 30.6 g/dL (ref 30.0–36.0)
MCV: 89.5 fL (ref 80.0–100.0)
PLATELETS: 78 10*3/uL — AB (ref 150–400)
RBC: 2.85 MIL/uL — ABNORMAL LOW (ref 4.22–5.81)
RDW: 16.5 % — AB (ref 11.5–15.5)
WBC: 8.6 10*3/uL (ref 4.0–10.5)
nRBC: 0 % (ref 0.0–0.2)

## 2017-12-30 LAB — GLUCOSE, CAPILLARY
GLUCOSE-CAPILLARY: 137 mg/dL — AB (ref 70–99)
GLUCOSE-CAPILLARY: 151 mg/dL — AB (ref 70–99)
GLUCOSE-CAPILLARY: 173 mg/dL — AB (ref 70–99)
Glucose-Capillary: 135 mg/dL — ABNORMAL HIGH (ref 70–99)
Glucose-Capillary: 126 mg/dL — ABNORMAL HIGH (ref 70–99)
Glucose-Capillary: 151 mg/dL — ABNORMAL HIGH (ref 70–99)
Glucose-Capillary: 151 mg/dL — ABNORMAL HIGH (ref 70–99)

## 2017-12-30 LAB — ECHOCARDIOGRAM COMPLETE
Height: 73 in
Weight: 4056.46 oz

## 2017-12-30 MED ORDER — POTASSIUM CHLORIDE IN NACL 40-0.9 MEQ/L-% IV SOLN
INTRAVENOUS | Status: DC
  Administered 2017-12-30 – 2017-12-31 (×2): 75 mL/h via INTRAVENOUS
  Filled 2017-12-30: qty 1000

## 2017-12-30 MED ORDER — IBUPROFEN 100 MG/5ML PO SUSP
400.0000 mg | Freq: Once | ORAL | Status: DC
  Filled 2017-12-30 (×2): qty 20

## 2017-12-30 MED ORDER — SODIUM CHLORIDE 0.9 % IV SOLN
300.0000 mg | Freq: Two times a day (BID) | INTRAVENOUS | Status: DC
  Administered 2017-12-30 – 2018-01-05 (×12): 300 mg via INTRAVENOUS
  Filled 2017-12-30 (×16): qty 300

## 2017-12-30 MED ORDER — SODIUM CHLORIDE 0.9 % IV SOLN
400.0000 mg | Freq: Once | INTRAVENOUS | Status: AC
  Administered 2017-12-30: 400 mg via INTRAVENOUS
  Filled 2017-12-30: qty 4

## 2017-12-30 MED ORDER — SODIUM CHLORIDE 0.9 % IV BOLUS
500.0000 mL | Freq: Once | INTRAVENOUS | Status: AC
  Administered 2017-12-30: 500 mL via INTRAVENOUS

## 2017-12-30 NOTE — Progress Notes (Signed)
Patient rectal temp was 103. 650 mg  Tylenol suppository  was administered.  At 0012 temp was 102.5 rectally.  Notified Merdis Delay, NP

## 2017-12-30 NOTE — Progress Notes (Signed)
PROGRESS NOTE   Peter Weiss  GGE:[id number]    DOB: 08-02-1940    DOA: 12/28/2017  PCP: Nolene Ebbs, MD   I have briefly reviewed patients previous medical records in [facility].  Brief Narrative:  77 year old male reportedly lives at an extended stay hotel off of [facility] reportedly independent with his ADLs at baseline (normally alert and oriented and walks his dog daily), PMH of aortic valve stenosis, status post AVR (bovine valve), A. fib not on anticoagulation, chronic diastolic CHF, DM 2, dementia, lumbar DDD, AAA, HTN, HLD, depression, BPH, GI bleeding, alcohol use, EMS was called out by security who had to use forced entry to enter patient's room where he was found in bed covered in urine and brought to ED for altered mental status.  He was unable to provide any history in the ED.  He was admitted for MRSA bacteremia/sepsis, suspected acute cholecystitis and acute encephalopathy.  Cardiology consulted due to elevated troponins.  General surgery/IR consulted for acute cholecystitis.  ID consulted for MRSA bacteremia.  Improving.  Assessment & Plan:   Principal Problem:   MRSA bacteremia Active Problems:   Type II diabetes mellitus with renal manifestations (HCC)   Aortic stenosis   Atrial fibrillation, chronic   Essential hypertension   Dementia (HCC)   Normocytic anemia   Sepsis ([place])   Acute cholecystitis   CKD (chronic kidney disease), stage III (HCC)   Chronic diastolic CHF (congestive heart failure) (HCC)   BPH (benign prostatic hyperplasia)   Acute metabolic encephalopathy   Hypokalemia   Elevated troponin   Sepsis due to MRSA bacteremia Met sepsis criteria on admission.  Blood cultures x2 and BCID show MRSA.  Treated per sepsis protocol with IV fluids and initiated on IV vancomycin and Zosyn.  ID consultation and follow-up appreciated.  Source of infection unclear at this time.  TTE was limited study and showed no vegetations.  Zosyn  discontinued.  Continue vancomycin.  Cardiology consulted for TEE, likely for next week.  I discussed with Dr. Baxter Flattery, ID who will add rifampin until endocarditis ruled out.  Cholecystitis ruled out CT abdomen and pelvis with contrast 10/17: Gallbladder wall thickening and edema with surrounding infiltration suggesting acute cholecystitis.  General surgery was consulted.  HIDA scan was normal.  Cholecystitis ruled out.  Acute toxic metabolic encephalopathy Exact etiology not clear but possibly related to sepsis.  No acute intracranial abnormality noted.  No focal deficits.  Delirium precautions.  Alcohol use listed but amounts not known.  UDS negative.  Ammonia levels normal.  Avoid QT prolonging medications due to prolonged QTC. CIWA protocol.  Discussed with CCM MD on call and alerted them that if agitation not controlled, may need ICU admission and Precedex drip.  Monitor closely.  Safety sitter requested. Was still somnolent this morning.  However since then as discussed with ID, mental status has improved.  Lethargic but able to answer some questions correctly.  Continue current treatment and monitor closely.  A. fib with mild RVR CHA2DS2-VASc Score is 5.  Patient reportedly not anticoagulation candidate due to history of GI bleed.  Since NPO/AMS, continue IV metoprolol 5 mg 3 times daily.  Monitor on telemetry.  Controlled ventricular rate.  Transition to oral medications when consistently alert.  Elevated troponin: No report of chest pain.  Mild increased from 0.16-0.26 but plateaued thereafter.  Could be demand ischemia from sepsis.  Cardiology consulted but likely supportive care.  Continue rectal aspirin.  TTE shows  normal EF.  Requested Cardiology for TEE to rule out endocarditis.  Aortic stenosis status post AVR Follow TTE and will likely need TEE  DM 2 with renal manifestations A1c 7.1.  Continue SSI with close monitoring.  Reasonable inpatient control.  Essential  hypertension Controlled..  Continue scheduled IV metoprolol and PRN IV hydralazine.  Dementia Holding home Aricept.  Normocytic anemia Hemoglobin has dropped from 9-7.8.  No overt bleeding.  Could be dilutional versus critical illness.  Follow CBC in a.m.  Transfuse if hemoglobin 7 or less.  Thrombocytopenia Could be related to sepsis.  No bleeding reported.  Hemoglobin has dropped further from 95-78.  Follow CBC in a.m.  Acute on stage III chronic kidney disease Baseline creatinine not clear but may be in the 1-1.3 range.  Presented with creatinine of 1.4 and stable in that range.  Creatinine has slightly increased to 1.5, follow BMP in a.m.  Chronic diastolic CHF: TTE 0/53/9767 showed LVEF 65-70%.  Clinically dehydrated and not volume overloaded.  Hold Lasix.  Continue to monitor closely while on gentle IV fluids.  BPH Holding Flomax.  Foley catheter placed in ED.  Hypokalemia Replaced.  Hypomagnesemia Magnesium 1.2 on admission.  Improved after replacing.  Follow in a.m.  Metabolic acidosis/elevated lactate Peak lactate 3.12.  Improved to 2.3.  Continue IV fluids.  Resolved.  Prolonged QTC Replace hypokalemia and hypomagnesemia.  Monitor on telemetry.  Avoid QT prolonging medications.  EKG 12/29/2017: RBBB, QTC 533 (not sure if accurate due to BBB).  Stable mass within the pituitary fossa:  Noted on CT head 10/17.  Mild mass-effect on the optic chiasm and prechiasmatic optic nerves.  Unclear etiology.  Outpatient follow-up.  Obesity/Body mass index is 33.45 kg/m.    DVT prophylaxis: SCDs Code Status: Full Family Communication: Attempted to reach listed contacts without success.  As per charge nurse, she to tried unsuccessfully to contact listed contacts. Disposition: To be determined.  Admit to stepdown unit for close monitoring and management.   Consultants:  General surgery Interventional radiology Cardiology Infectious disease  Procedures:  Foley catheter  10/17 >  Antimicrobials:  IV vancomycin 10/17 > Zosyn discontinued   Subjective: When I saw him early this morning, patient was somnolent, barely arousable and mumbled incomprehensively.  As per sitter at bedside, intermittently got up at night, calling for help, wanting to urinate and attempting to get out of bed.  As per RN, did get Ativan overnight.  I subsequently discussed with ID who indicated that patient was more alert and answered questions appropriately.  ROS: Unable.  Objective:  Vitals:   12/30/17 1242 12/30/17 1300 12/30/17 1400 12/30/17 1438  BP: 126/77     Pulse: (!) 107 (!) 106 (!) 107 (!) 108  Resp: (!) 22 (!) 23 (!) 25 (!) 27  Temp:    (!) 100.4 F (38 C)  TempSrc:    Rectal  SpO2: 100% 100% 100% 100%  Weight:      Height:        Examination:  General exam: Elderly male, moderately built and obese, lying comfortably propped up in bed without distress.  Looked much improved compared to yesterday while in ED. Respiratory system: Clear to auscultation without wheezing, rhonchi or crackles. Respiratory effort normal.  Midline sternotomy scar. Cardiovascular system: S1 & S2 heard, irregularly irregular. No JVD, murmurs, rubs, gallops or clicks.  Trace bilateral ankle edema.  Telemetry personally reviewed this morning: A. fib with BBB morphology and controlled ventricular rate in the 100s. Gastrointestinal system:  Abdomen is nondistended but obese, soft and nontender. No organomegaly or masses felt. Normal bowel sounds heard.  Stable Central nervous system: Mental status as indicated above.  Not oriented.  Does not follow instructions. No focal neurological deficits. Extremities: Symmetric 5 x 5 power.  Moves all limbs symmetrically and actively. Skin: Generalized multiple skin ulcers at different stages of healing but none appear acutely infected. Psychiatry: Judgement and insight impaired. Mood & affect cannot be assessed. Neck: supple.     Data Reviewed: I  have personally reviewed following labs and imaging studies  CBC: Recent Labs  Lab 12/28/17 2232 12/29/17 0254 12/29/17 0753 12/30/17 0812  WBC 14.0* 10.7* 13.7* 8.6  NEUTROABS 13.2*  --   --   --   HGB 10.2* 8.5* 9.0* 7.8*  HCT 33.0* 28.1* 30.6* 25.5*  MCV 88.9 88.9 90.5 89.5  PLT 166 120* 95* 78*   Basic Metabolic Panel: Recent Labs  Lab 12/28/17 2232 12/28/17 2318 12/29/17 0254 12/29/17 0753 12/30/17 0812  NA 135  --  137 136 143  K 2.8*  --  2.8* 3.5 3.6  CL 96*  --  106 107 118*  CO2 21*  --  15* 17* 20*  GLUCOSE 227*  --  220* 255* 157*  BUN 20  --  21 23 34*  CREATININE 1.45*  --  1.40* 1.48* 1.51*  CALCIUM 8.8*  --  7.7* 7.5* 7.5*  MG  --  1.2*  --  1.6*  --    Liver Function Tests: Recent Labs  Lab 12/28/17 2232 12/29/17 0254 12/29/17 0753 12/30/17 0812  AST 23 24 34 26  ALT 13 12 15 15   ALKPHOS 45 33* 35* 34*  BILITOT 1.9* 2.0* 1.9* 1.0  PROT 7.3 5.8* 6.1* 5.3*  ALBUMIN 3.3* 2.7* 2.7* 2.3*   Coagulation Profile: Recent Labs  Lab 12/28/17 2318  INR 1.59   Cardiac Enzymes: Recent Labs  Lab 12/28/17 2318 12/29/17 0254 12/29/17 0753 12/29/17 1559  TROPONINI 0.14* 0.26* 0.24* 0.18*   HbA1C: Recent Labs    12/29/17 0254  HGBA1C 7.1*   CBG: Recent Labs  Lab 12/29/17 2013 12/30/17 0046 12/30/17 0403 12/30/17 0843 12/30/17 1216  GLUCAP 132* 151* 173* 135* 126*    Recent Results (from the past 240 hour(s))  Blood Culture (routine x 2)     Status: Abnormal (Preliminary result)   Collection Time: 12/28/17 10:32 PM  Result Value Ref Range Status   Specimen Description BLOOD RIGHT ANTECUBITAL  Final   Special Requests   Final    BOTTLES DRAWN AEROBIC AND ANAEROBIC Blood Culture results may not be optimal due to an excessive volume of blood received in culture bottles   Culture  Setup Time   Final    GRAM POSITIVE COCCI IN BOTH AEROBIC AND ANAEROBIC BOTTLES Organism ID to follow CRITICAL RESULT CALLED TO, READ BACK BY AND VERIFIED  WITH: Ferne Coe PharmD 10:45 12/29/17 ([email or web address])    Culture (A)  Final    STAPHYLOCOCCUS AUREUS SUSCEPTIBILITIES TO FOLLOW Performed at [facility], [address] [place], [place] [address]    Report Status PENDING  Incomplete  Blood Culture ID Panel (Reflexed)     Status: Abnormal   Collection Time: 12/28/17 10:32 PM  Result Value Ref Range Status   Enterococcus species NOT DETECTED NOT DETECTED Final   Listeria monocytogenes NOT DETECTED NOT DETECTED Final   Staphylococcus species DETECTED (A) NOT DETECTED Final    Comment: CRITICAL RESULT CALLED TO, READ BACK  BY AND VERIFIED WITH: Ferne Coe PharmD 10:45 12/29/17 ([email or web address])    Staphylococcus aureus (BCID) DETECTED (A) NOT DETECTED Final    Comment: Methicillin (oxacillin)-resistant Staphylococcus aureus (MRSA). MRSA is predictably resistant to beta-lactam antibiotics (except ceftaroline). Preferred therapy is vancomycin unless clinically contraindicated. Patient requires contact precautions if  hospitalized. CRITICAL RESULT CALLED TO, READ BACK BY AND VERIFIED WITH: Ferne Coe PharmD 10:45 12/29/17 ([email or web address])    Methicillin resistance DETECTED (A) NOT DETECTED Final    Comment: CRITICAL RESULT CALLED TO, READ BACK BY AND VERIFIED WITH: Ferne Coe PharmD 10:45 12/29/17 ([email or web address])    Streptococcus species NOT DETECTED NOT DETECTED Final   Streptococcus agalactiae NOT DETECTED NOT DETECTED Final   Streptococcus pneumoniae NOT DETECTED NOT DETECTED Final   Streptococcus pyogenes NOT DETECTED NOT DETECTED Final   Acinetobacter baumannii NOT DETECTED NOT DETECTED Final   Enterobacteriaceae species NOT DETECTED NOT DETECTED Final   Enterobacter cloacae complex NOT DETECTED NOT DETECTED Final   Escherichia coli NOT DETECTED NOT DETECTED Final   Klebsiella oxytoca NOT DETECTED NOT DETECTED Final   Klebsiella pneumoniae NOT DETECTED NOT DETECTED Final   Proteus species NOT DETECTED NOT DETECTED Final   Serratia marcescens NOT  DETECTED NOT DETECTED Final   Haemophilus influenzae NOT DETECTED NOT DETECTED Final   Neisseria meningitidis NOT DETECTED NOT DETECTED Final   Pseudomonas aeruginosa NOT DETECTED NOT DETECTED Final   Candida albicans NOT DETECTED NOT DETECTED Final   Candida glabrata NOT DETECTED NOT DETECTED Final   Candida krusei NOT DETECTED NOT DETECTED Final   Candida parapsilosis NOT DETECTED NOT DETECTED Final   Candida tropicalis NOT DETECTED NOT DETECTED Final    Comment: Performed at [facility], [address], [place], [place] [address]  Blood Culture (routine x 2)     Status: Abnormal (Preliminary result)   Collection Time: 12/28/17 11:18 PM  Result Value Ref Range Status   Specimen Description BLOOD RIGHT HAND  Final   Special Requests   Final    BOTTLES DRAWN AEROBIC AND ANAEROBIC Blood Culture results may not be optimal due to an inadequate volume of blood received in culture bottles   Culture  Setup Time   Final    GRAM POSITIVE COCCI IN CLUSTERS IN BOTH AEROBIC AND ANAEROBIC BOTTLES Performed at [facility], [place] [address], [place], [place] [address]    Culture STAPHYLOCOCCUS AUREUS (A)  Final   Report Status PENDING  Incomplete  Urine culture     Status: None (Preliminary result)   Collection Time: 12/29/17 12:03 AM  Result Value Ref Range Status   Specimen Description URINE, RANDOM  Final   Special Requests Immunocompromised  Final   Culture   Final    CULTURE REINCUBATED FOR BETTER GROWTH Performed at [facility], [place] [address], [place], [place] [address]    Report Status PENDING  Incomplete         Radiology Studies: Dg Chest 2 View  Result Date: 12/28/2017 CLINICAL DATA:  Altered mental status EXAM: CHEST - 2 VIEW COMPARISON:  07/06/2017 FINDINGS: Post sternotomy changes and valve prosthesis. Low lung volumes. Mild cardiomegaly. Aortic atherosclerosis. No pneumothorax. IMPRESSION: No active cardiopulmonary disease.  Low lung volumes.  Electronically Signed   By: Donavan Foil M.D.   On: 12/28/2017 23:18   Ct Head Wo Contrast  Result Date: 12/29/2017 CLINICAL DATA:  77 y/o M; found in bed covered urine. Altered level of consciousness, unexplained. EXAM: CT HEAD WITHOUT CONTRAST TECHNIQUE: Contiguous axial  images were obtained from the base of the skull through the vertex without intravenous contrast. COMPARISON:  06/04/2017 CT head. FINDINGS: Brain: No evidence of acute infarction, hemorrhage, hydrocephalus, extra-axial collection or mass lesion/mass effect of brain parenchyma. 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 The pituitary mass abuts the optic chiasm and pre chiasmatic optic nerves without significant displacement. Stable small chronic infarction within the left frontal operculum, chronic microvascular ischemic changes of the brain, and volume loss. Vascular: Foci of air present throughout the venous structures of the face and the cavernous sinus, likely after Janik. Calcific atherosclerosis of carotid siphons. No hyperdense vessel identified. Skull: Normal. Negative for fracture or focal lesion. Sinuses/Orbits: Mild mucosal thickening of the maxillary sinuses and the frontal sinuses. Small maxillary sinus mucous retention cyst. Normal aeration of the mastoid air cells. Other: None. IMPRESSION: 1. No acute intracranial abnormality identified. 2. Stable mass within the pituitary fossa with mild mass effect on the optic chiasm and pre chiasmatic optic nerves. 3. Stable small chronic infarction in left frontal operculum, chronic microvascular ischemic changes of the brain, and volume loss. Electronically Signed   By: Kristine Garbe M.D.   On: 12/29/2017 03:58   Nm Hepatobiliary Liver Func  Result Date: 12/29/2017 CLINICAL DATA:  Right upper quadrant pain.  Suspected cholecystitis. EXAM: NUCLEAR MEDICINE HEPATOBILIARY IMAGING TECHNIQUE: Sequential images of the abdomen were obtained  out to 60 minutes following intravenous administration of radiopharmaceutical. RADIOPHARMACEUTICALS:  5.1 mCi Tc-68m Choletec IV COMPARISON:  CT 12/29/2017. FINDINGS: Prompt uptake and biliary excretion of activity by the liver is seen. Gallbladder activity is visualized, consistent with patency of cystic duct. Biliary activity passes into small bowel, consistent with patent common bile duct. IMPRESSION: Normal exam.  Gallbladder visualizes normally. Electronically Signed   By: [place]  On: 12/29/2017 15:04   Ct Abdomen Pelvis W Contrast  Result Date: 12/29/2017 CLINICAL DATA:  Patient found down. Fever and suspected abscess. EXAM: CT ABDOMEN AND PELVIS WITH CONTRAST TECHNIQUE: Multidetector CT imaging of the abdomen and pelvis was performed using the standard protocol following bolus administration of intravenous contrast. CONTRAST:  1068mOMNIPAQUE IOHEXOL 300 MG/ML  SOLN COMPARISON:  None. FINDINGS: Lower chest: Motion artifact limits examination. Atelectasis in the lung bases. Postoperative changes in the mediastinum. Cardiac enlargement. Hepatobiliary: No focal liver lesions. Gallbladder wall is thickened and edematous with some stranding in the surrounding fat. This suggest cholecystitis. No stones are visualized. No bile duct dilatation. Pancreas: Fatty infiltration of the pancreas. No focal lesion or inflammatory changes appreciated. Spleen: Normal in size without focal abnormality. Adrenals/Urinary Tract: Adrenal glands are unremarkable. Kidneys are normal, without renal calculi, focal lesion, or hydronephrosis. Bladder is decompressed. Stomach/Bowel: Stomach, small bowel, and colon are not abnormally distended. No wall thickening or inflammatory infiltration are demonstrated. The appendix is normal. Vascular/Lymphatic: Aortic atherosclerosis. No enlarged abdominal or pelvic lymph nodes. Reproductive: Prostate is unremarkable. Other: No free air or free fluid in the abdomen or pelvis. No  loculated fluid collections to suggest abscess. Abdominal wall musculature appears intact. Musculoskeletal: Degenerative changes in the lumbar spine. No destructive bone lesions. IMPRESSION: Gallbladder wall thickening and edema with surrounding infiltration suggesting acute cholecystitis. No stones are visualized. No evidence of bowel obstruction or inflammation. No abscess identified. Electronically Signed   By: WiLucienne Capers.D.   On: 12/29/2017 01:26        Scheduled Meds: . aspirin  300 mg Rectal Daily  . folic acid  1 mg  Oral Daily  . insulin aspart  0-9 Units Subcutaneous Q4H  . metoprolol tartrate  5 mg Intravenous Q8H  . multivitamin with minerals  1 tablet Oral Daily  . thiamine injection  100 mg Intravenous Daily   Continuous Infusions: . 0.9 % NaCl with KCl 40 mEq / L 75 mL/hr (12/30/17 1604)  . vancomycin 1,250 mg (12/29/17 1942)     LOS: 1 day     Vernell Leep, MD, FACP, [facility]. [facility] Pager [phone]  If 7PM-7AM, please contact night-coverage [email or web address] Password [email or web address] 12/30/2017, 4:51 PM

## 2017-12-30 NOTE — Progress Notes (Addendum)
Progress Note  Patient Name: Peter Weiss Date of Encounter: 12/30/2017  Primary Cardiologist: No primary care provider on file.   Subjective   Patient is lethargic.  Sedated after receiving Ativan for agitation last night.  Inpatient Medications    Scheduled Meds: . aspirin  300 mg Rectal Daily  . folic acid  1 mg Oral Daily  . insulin aspart  0-9 Units Subcutaneous Q4H  . metoprolol tartrate  5 mg Intravenous Q8H  . multivitamin with minerals  1 tablet Oral Daily  . thiamine injection  100 mg Intravenous Daily   Continuous Infusions: . 0.9 % NaCl with KCl 40 mEq / L    . vancomycin 1,250 mg (12/29/17 1942)   PRN Meds: acetaminophen **OR** acetaminophen, hydrALAZINE, LORazepam, morphine injection, ondansetron **OR** ondansetron (ZOFRAN) IV   Vital Signs    Vitals:   12/30/17 0418 12/30/17 0549 12/30/17 0845 12/30/17 1042  BP: (!) 162/98   134/89  Pulse:    (!) 103  Resp: (!) 29   (!) 22  Temp:  100.2 F (37.9 C) 97.6 F (36.4 C) 99.4 F (37.4 C)  TempSrc:  Rectal Axillary Rectal  SpO2:    100%  Weight:      Height:        Intake/Output Summary (Last 24 hours) at 12/30/2017 1127 Last data filed at 12/30/2017 0357 Gross per 24 hour  Intake 1208.28 ml  Output 1325 ml  Net -116.72 ml   Filed Weights   12/28/17 2225 12/29/17 1300  Weight: 114.6 kg 115 kg    Telemetry    Irregular rhythm with RBBB possibly afib, rates 80s-100s- Personally Reviewed  ECG    No new tracings- Personally Reviewed  Physical Exam   GEN: Sedated, No acute distress.   Neck: No JVD Cardiac:  Irregularly irregular rhythm, no murmurs, rubs, or gallops.  Respiratory: Clear to auscultation bilaterally. GI: Soft, nontender MS: No edema; No deformity. Neuro:   Patient is lethargic.  He moans a little bit during examination.  Per nurse report he is disoriented. Psych: Withdrawn  Labs    Chemistry Recent Labs  Lab 12/29/17 0254 12/29/17 0753 12/30/17 0812  NA  137 136 143  K 2.8* 3.5 3.6  CL 106 107 118*  CO2 15* 17* 20*  GLUCOSE 220* 255* 157*  BUN 21 23 34*  CREATININE 1.40* 1.48* 1.51*  CALCIUM 7.7* 7.5* 7.5*  PROT 5.8* 6.1* 5.3*  ALBUMIN 2.7* 2.7* 2.3*  AST 24 34 26  ALT 12 15 15   ALKPHOS 33* 35* 34*  BILITOT 2.0* 1.9* 1.0  GFRNONAA 48* 45* 43*  GFRAA 55* 52* 50*  ANIONGAP 16* 12 5     Hematology Recent Labs  Lab 12/29/17 0254 12/29/17 0753 12/30/17 0812  WBC 10.7* 13.7* 8.6  RBC 3.16* 3.38* 2.85*  HGB 8.5* 9.0* 7.8*  HCT 28.1* 30.6* 25.5*  MCV 88.9 90.5 89.5  MCH 26.9 26.6 27.4  MCHC 30.2 29.4* 30.6  RDW 15.9* 16.2* 16.5*  PLT 120* 95* 78*    Cardiac Enzymes Recent Labs  Lab 12/28/17 2318 12/29/17 0254 12/29/17 0753 12/29/17 1559  TROPONINI 0.14* 0.26* 0.24* 0.18*   No results for input(s): TROPIPOC in the last 168 hours.   BNP Recent Labs  Lab 12/29/17 0254  BNP 1,059.0*     DDimer No results for input(s): DDIMER in the last 168 hours.   Radiology    Dg Chest 2 View  Result Date: 12/28/2017 CLINICAL DATA:  Altered mental status  EXAM: CHEST - 2 VIEW COMPARISON:  07/06/2017 FINDINGS: Post sternotomy changes and valve prosthesis. Low lung volumes. Mild cardiomegaly. Aortic atherosclerosis. No pneumothorax. IMPRESSION: No active cardiopulmonary disease.  Low lung volumes. Electronically Signed   By: Jasmine Pang M.D.   On: 12/28/2017 23:18   Ct Head Wo Contrast  Result Date: 12/29/2017 CLINICAL DATA:  77 y/o M; found in bed covered urine. Altered level of consciousness, unexplained. EXAM: CT HEAD WITHOUT CONTRAST TECHNIQUE: Contiguous axial images were obtained from the base of the skull through the vertex without intravenous contrast. COMPARISON:  06/04/2017 CT head. FINDINGS: Brain: No evidence of acute infarction, hemorrhage, hydrocephalus, extra-axial collection or mass lesion/mass effect of brain parenchyma. 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 The  pituitary mass abuts the optic chiasm and pre chiasmatic optic nerves without significant displacement. Stable small chronic infarction within the left frontal operculum, chronic microvascular ischemic changes of the brain, and volume loss. Vascular: Foci of air present throughout the venous structures of the face and the cavernous sinus, likely after Janik. Calcific atherosclerosis of carotid siphons. No hyperdense vessel identified. Skull: Normal. Negative for fracture or focal lesion. Sinuses/Orbits: Mild mucosal thickening of the maxillary sinuses and the frontal sinuses. Small maxillary sinus mucous retention cyst. Normal aeration of the mastoid air cells. Other: None. IMPRESSION: 1. No acute intracranial abnormality identified. 2. Stable mass within the pituitary fossa with mild mass effect on the optic chiasm and pre chiasmatic optic nerves. 3. Stable small chronic infarction in left frontal operculum, chronic microvascular ischemic changes of the brain, and volume loss. Electronically Signed   By: Mitzi Hansen M.D.   On: 12/29/2017 03:58   Nm Hepatobiliary Liver Func  Result Date: 12/29/2017 CLINICAL DATA:  Right upper quadrant pain.  Suspected cholecystitis. EXAM: NUCLEAR MEDICINE HEPATOBILIARY IMAGING TECHNIQUE: Sequential images of the abdomen were obtained out to 60 minutes following intravenous administration of radiopharmaceutical. RADIOPHARMACEUTICALS:  5.1 mCi Tc-77m  Choletec IV COMPARISON:  CT 12/29/2017. FINDINGS: Prompt uptake and biliary excretion of activity by the liver is seen. Gallbladder activity is visualized, consistent with patency of cystic duct. Biliary activity passes into small bowel, consistent with patent common bile duct. IMPRESSION: Normal exam.  Gallbladder visualizes normally. Electronically Signed   By: Maisie Fus  Register   On: 12/29/2017 15:04   Ct Abdomen Pelvis W Contrast  Result Date: 12/29/2017 CLINICAL DATA:  Patient found down. Fever and suspected  abscess. EXAM: CT ABDOMEN AND PELVIS WITH CONTRAST TECHNIQUE: Multidetector CT imaging of the abdomen and pelvis was performed using the standard protocol following bolus administration of intravenous contrast. CONTRAST:  OMNIPAQUE IOHEXOL 300 MG/ML  SOLN COMPARISON:  None. FINDINGS: Lower chest: Motion artifact limits examination. Atelectasis in the lung bases. Postoperative changes in the mediastinum. Cardiac enlargement. Hepatobiliary: No focal liver lesions. Gallbladder wall is thickened and edematous with some stranding in the surrounding fat. This suggest cholecystitis. No stones are visualized. No bile duct dilatation. Pancreas: Fatty infiltration of the pancreas. No focal lesion or inflammatory changes appreciated. Spleen: Normal in size without focal abnormality. Adrenals/Urinary Tract: Adrenal glands are unremarkable. Kidneys are normal, without renal calculi, focal lesion, or hydronephrosis. Bladder is decompressed. Stomach/Bowel: Stomach, small bowel, and colon are not abnormally distended. No wall thickening or inflammatory infiltration are demonstrated. The appendix is normal. Vascular/Lymphatic: Aortic atherosclerosis. No enlarged abdominal or pelvic lymph nodes. Reproductive: Prostate is unremarkable. Other: No free air or free fluid in the abdomen or pelvis. No loculated fluid collections to  suggest abscess. Abdominal wall musculature appears intact. Musculoskeletal: Degenerative changes in the lumbar spine. No destructive bone lesions. IMPRESSION: Gallbladder wall thickening and edema with surrounding infiltration suggesting acute cholecystitis. No stones are visualized. No evidence of bowel obstruction or inflammation. No abscess identified. Electronically Signed   By: Burman Nieves M.D.   On: 12/29/2017 01:26    Cardiac Studies   Echocardiogram pending  Most recent echocardiogram 07/04/2014 Study Conclusions - Left ventricle: The cavity size was normal. Systolic function  was vigorous. The estimated ejection fraction was in the range of 65% to 70%. Wall motion was normal; there were no regional wall motion abnormalities. Doppler parameters are consistent with restrictive physiology, indicative of decreased left ventricular diastolic compliance and/or increased left atrial pressure. Doppler parameters are consistent with elevated ventricular end-diastolic filling pressure. - Aortic valve: Aortic valve replacement with 27 mm Edwards Perimount Bovine pericardial valve. There is limited visualization of the valve, but no obvious central insufficiency or oaravalvular leak is seem. Normal transaortic gardients. Cusp separation was normal. Mean gradient (S): 10 mm Hg. Peak gradient (S): 16 mm Hg. Valve area (VTI): 1.93 cm^2. Valve area (Vmax): 1.76 cm^2. Valve area (Vmean): 1.46 cm^2. - Aortic root: The aortic root was normal in size. - Left atrium: The atrium was moderately to severely dilated. - Right ventricle: The cavity size was normal. Wall thickness was normal. Systolic function was normal. - Tricuspid valve: There was trivial regurgitation. - Pericardium, extracardiac: Mild pericardial effusion was identified. Features were not consistent with tamponade physiology.  Patient Profile     77 y.o. male with history of aortic stenosis-status post aortic valve replacement-tissue valve, hypertension, atrial fibrillation, history of GI bleed and dementia who presented with complaints of pain and altered mental status with incoherent speech  Assessment & Plan    Elevated troponin level -Troponins mildly elevated with peak of 0.26 and trended down to 0.18.  Likely related to demand ischemia in the setting of sepsis. -With his current sepsis illness he is not currently a candidate for any invasive procedures. -Echocardiogram is pending.  If EF is normal we will sign off.  MRSA bacteremia -Being followed by infectious disease.   Unclear source of infection at present. -He continues to be febrile with temperature of 103 overnight -He continues to be confused and lethargic, but somewhat improved since admission. -It was noted that he does not appear to have any abdominal infection.  There is concern for a mild drop of hemoglobin from 9-7.8 -He is on IV antibiotics -Transthoracic echocardiogram has been ordered.  He may need a TEE per infectious disease to look for valvular involvement.  This would have to wait until the patient's condition significantly improves including mental status.  Altered mental status on history of dementia -Likely related to his infection and sepsis  Possible atrial fibrillation -Patient has a history of atrial fibrillation, not on medications apparently due to hypotension -Heart rhythm is irregular, possibly atrial fibrillation, rate is controlled -Not a candidate for anticoagulation in his current state.  History of aortic stenosis status post aortic valve replacement -Evaluate valve on echocardiogram.  No murmur, although heart sounds are distant.       For questions or updates, please contact [facility] Please consult [email or web address] for contact info under      Signed, Berton Bon, NP  12/30/2017, 11:27 AM    Attending Note:   The patient was seen and examined.  Agree with assessment and plan as noted above.  Changes made to  the above note as needed.  Patient seen and independently examined with  Lizabeth Leyden, NP.   We discussed all aspects of the encounter. I agree with the assessment and plan as stated above.  1.  Elevated troponin level: The patient presented with sepsis syndrome and has MRSA bacteremia.  The troponin levels are mildly elevated.  The echocardiogram reveals vigorous left ventricular systolic function.  I suspect that the mild troponin elevations are due to demand ischemia.  In his current state, he is a very poor candidate for any further work-up.     He is not able to cooperate adequately for any additional cardiac tests.  2.  Bacteremia: Agree with IV antibiotics.  At present he is not cooperative enough to perform a transesophageal echo.  [facility] will sign off.    Medication Recommendations:  Further plans per Int. Medicine team  Other recommendations (labs, testing, etc):   Follow up as an outpatient:      I have spent a total of 20 minutes with patient reviewing hospital  notes , telemetry, EKGs, labs and examining patient as well as establishing an assessment and plan that was discussed with the patient. > 50% of time was spent in direct patient care.    Vesta Mixer, Montez Hageman., MD, [facility] 12/30/2017, 4:59 PM [address]  [address] Office [phone] Pager [phone]

## 2017-12-30 NOTE — Progress Notes (Signed)
Patient rectal  temp 102.7.  Patient is lethargic. Unable to administer ibuprofen suspension.  Notified Merdis Delay, NP .  Will continue to monitor the patient

## 2017-12-30 NOTE — Progress Notes (Signed)
[facility]  Date of Admission:  12/28/2017            ASSESSMENT/PLAN  [name] has MRSA bacteremia with unclear source of infection at present. Continues to remain febrile although his mentation has improved today and is lethargic and able to answer some questions correctly. TTE is pending. Repeat cultures are in process. Does not appear to have any abdominal infection at present. There is concern with a mild drop of hemoglobin from 9.0 to 7.8.   1. Continue current dose of vancomycin. 2. Monitor cultures, fever curve and WBC count. 3. Await TTE results and will likely need TEE.  4. Check Hepatitis C and HIV status.   Principal Problem:   MRSA bacteremia Active Problems:   Type II diabetes mellitus with renal manifestations (HCC)   Aortic stenosis   Atrial fibrillation, chronic   Essential hypertension   Dementia (HCC)   Normocytic anemia   Sepsis (HCC)   Acute cholecystitis   CKD (chronic kidney disease), stage III (HCC)   Chronic diastolic CHF (congestive heart failure) (HCC)   BPH (benign prostatic hyperplasia)   Acute metabolic encephalopathy   Hypokalemia   Elevated troponin   . aspirin  300 mg Rectal Daily  . folic acid  1 mg Oral Daily  . insulin aspart  0-9 Units Subcutaneous Q4H  . metoprolol tartrate  5 mg Intravenous Q8H  . multivitamin with minerals  1 tablet Oral Daily  . thiamine injection  100 mg Intravenous Daily    SUBJECTIVE:  Febrile overnight with max temp of 103 and treated with Tylenol and ibuprofen.  Lab work reviewed with creatinine of 1.51.  White blood cell count of 8.6.  Hemoglobin 7.8 down from 9.  Repeat blood cultures drawn and pending.  2D echocardiogram has been ordered and is pending as well.  [name] is improved to lethargic today and able to answer basic questions with yes/no responses.  No Known Allergies   Review of Systems: Review of Systems  Unable to perform ROS: Acuity of condition       OBJECTIVE: Vitals:   12/30/17 0417 12/30/17 0418 12/30/17 0549 12/30/17 0845  BP:  (!) 162/98    Pulse:      Resp:  (!) 29    Temp: (!) 101.1 F (38.4 C)  100.2 F (37.9 C) 97.6 F (36.4 C)  TempSrc: Rectal  Rectal Axillary  SpO2:      Weight:      Height:       Body mass index is 33.45 kg/m.  Physical Exam  Constitutional: He is oriented to person, place, and time. He appears well-developed and well-nourished. He appears lethargic. He has a sickly appearance. He appears ill. No distress.  Lying in bed with head of bed elevated.  Lethargic.  Brein restraint mittens in place.  Cardiovascular: Normal rate, regular rhythm, normal heart sounds and intact distal pulses.  Bilateral lower extremities cool to the touch with mottled-looking appearance.  Pulses are present.  Pulmonary/Chest: Effort normal and breath sounds normal. No stridor. No respiratory distress. He has no wheezes. He has no rales.  Neurological: He is oriented to person, place, and time. He appears lethargic.  Skin: Skin is warm and dry.  Psychiatric: He has a normal mood and affect. His behavior is normal. Judgment and thought content normal.    Lab Results Lab Results  Component Value Date   WBC 8.6 12/30/2017   HGB 7.8 (L) 12/30/2017  HCT 25.5 (L) 12/30/2017   MCV 89.5 12/30/2017   PLT 78 (L) 12/30/2017    Lab Results  Component Value Date   CREATININE 1.51 (H) 12/30/2017   BUN 34 (H) 12/30/2017   NA 143 12/30/2017   K 3.6 12/30/2017   CL 118 (H) 12/30/2017   CO2 20 (L) 12/30/2017    Lab Results  Component Value Date   ALT 15 12/30/2017   AST 26 12/30/2017   ALKPHOS 34 (L) 12/30/2017   BILITOT 1.0 12/30/2017     Microbiology: Recent Results (from the past 240 hour(s))  Blood Culture (routine x 2)     Status: Abnormal (Preliminary result)   Collection Time: 12/28/17 10:32 PM  Result Value Ref Range Status   Specimen Description BLOOD RIGHT ANTECUBITAL  Final   Special Requests    Final    BOTTLES DRAWN AEROBIC AND ANAEROBIC Blood Culture results may not be optimal due to an excessive volume of blood received in culture bottles   Culture  Setup Time   Final    GRAM POSITIVE COCCI IN BOTH AEROBIC AND ANAEROBIC BOTTLES Organism ID to follow CRITICAL RESULT CALLED TO, READ BACK BY AND VERIFIED WITH: Antoine Primas PharmD 10:45 12/29/17 ([email or web address])    Culture (A)  Final    STAPHYLOCOCCUS AUREUS SUSCEPTIBILITIES TO FOLLOW Performed at [facility], [address] [place], [place] [address]    Report Status PENDING  Incomplete  Blood Culture ID Panel (Reflexed)     Status: Abnormal   Collection Time: 12/28/17 10:32 PM  Result Value Ref Range Status   Enterococcus species NOT DETECTED NOT DETECTED Final   Listeria monocytogenes NOT DETECTED NOT DETECTED Final   Staphylococcus species DETECTED (A) NOT DETECTED Final    Comment: CRITICAL RESULT CALLED TO, READ BACK BY AND VERIFIED WITH: Antoine Primas PharmD 10:45 12/29/17 ([email or web address])    Staphylococcus aureus (BCID) DETECTED (A) NOT DETECTED Final    Comment: Methicillin (oxacillin)-resistant Staphylococcus aureus (MRSA). MRSA is predictably resistant to beta-lactam antibiotics (except ceftaroline). Preferred therapy is vancomycin unless clinically contraindicated. Patient requires contact precautions if  hospitalized. CRITICAL RESULT CALLED TO, READ BACK BY AND VERIFIED WITH: Antoine Primas PharmD 10:45 12/29/17 ([email or web address])    Methicillin resistance DETECTED (A) NOT DETECTED Final    Comment: CRITICAL RESULT CALLED TO, READ BACK BY AND VERIFIED WITH: Antoine Primas PharmD 10:45 12/29/17 ([email or web address])    Streptococcus species NOT DETECTED NOT DETECTED Final   Streptococcus agalactiae NOT DETECTED NOT DETECTED Final   Streptococcus pneumoniae NOT DETECTED NOT DETECTED Final   Streptococcus pyogenes NOT DETECTED NOT DETECTED Final   Acinetobacter baumannii NOT DETECTED NOT DETECTED Final   Enterobacteriaceae species NOT DETECTED NOT  DETECTED Final   Enterobacter cloacae complex NOT DETECTED NOT DETECTED Final   Escherichia coli NOT DETECTED NOT DETECTED Final   Klebsiella oxytoca NOT DETECTED NOT DETECTED Final   Klebsiella pneumoniae NOT DETECTED NOT DETECTED Final   Proteus species NOT DETECTED NOT DETECTED Final   Serratia marcescens NOT DETECTED NOT DETECTED Final   Haemophilus influenzae NOT DETECTED NOT DETECTED Final   Neisseria meningitidis NOT DETECTED NOT DETECTED Final   Pseudomonas aeruginosa NOT DETECTED NOT DETECTED Final   Candida albicans NOT DETECTED NOT DETECTED Final   Candida glabrata NOT DETECTED NOT DETECTED Final   Candida krusei NOT DETECTED NOT DETECTED Final   Candida parapsilosis NOT DETECTED NOT DETECTED Final   Candida tropicalis NOT DETECTED NOT DETECTED Final    Comment: Performed at [facility]  Lab, [address] [place], [place] [address]  Blood Culture (routine x 2)     Status: Abnormal (Preliminary result)   Collection Time: 12/28/17 11:18 PM  Result Value Ref Range Status   Specimen Description BLOOD RIGHT HAND  Final   Special Requests   Final    BOTTLES DRAWN AEROBIC AND ANAEROBIC Blood Culture results may not be optimal due to an inadequate volume of blood received in culture bottles   Culture  Setup Time   Final    GRAM POSITIVE COCCI IN CLUSTERS IN BOTH AEROBIC AND ANAEROBIC BOTTLES Performed at [facility], [address], [place], [place] [address]    Culture STAPHYLOCOCCUS AUREUS (A)  Final   Report Status PENDING  Incomplete  Urine culture     Status: None (Preliminary result)   Collection Time: 12/29/17 12:03 AM  Result Value Ref Range Status   Specimen Description URINE, RANDOM  Final   Special Requests Immunocompromised  Final   Culture   Final    CULTURE REINCUBATED FOR BETTER GROWTH Performed at [facility], [address] [place], [place] [address]    Report Status PENDING  Incomplete     Marcos Eke, NP [facility] [phone] Pager  12/30/2017  10:22 AM

## 2017-12-30 NOTE — Progress Notes (Signed)
[facility]  [facility] Infusion Coordinator will follow pt with ID to support Home Infusion Pharmacy services at [place] for IV ABX if needed.  If patient discharges after hours, please call [phone].   Sedalia Muta 12/30/2017, 2:45 PM

## 2017-12-31 DIAGNOSIS — R4182 Altered mental status, unspecified: Secondary | ICD-10-CM

## 2017-12-31 DIAGNOSIS — R7881 Bacteremia: Secondary | ICD-10-CM

## 2017-12-31 DIAGNOSIS — B9562 Methicillin resistant Staphylococcus aureus infection as the cause of diseases classified elsewhere: Secondary | ICD-10-CM

## 2017-12-31 DIAGNOSIS — R509 Fever, unspecified: Secondary | ICD-10-CM

## 2017-12-31 DIAGNOSIS — E872 Acidosis: Secondary | ICD-10-CM

## 2017-12-31 DIAGNOSIS — E870 Hyperosmolality and hypernatremia: Secondary | ICD-10-CM

## 2017-12-31 DIAGNOSIS — Z952 Presence of prosthetic heart valve: Secondary | ICD-10-CM

## 2017-12-31 LAB — COMPREHENSIVE METABOLIC PANEL
ALK PHOS: 71 U/L (ref 38–126)
ALT: 23 U/L (ref 0–44)
AST: 45 U/L — AB (ref 15–41)
Albumin: 2.4 g/dL — ABNORMAL LOW (ref 3.5–5.0)
Anion gap: 10 (ref 5–15)
BUN: 39 mg/dL — AB (ref 8–23)
CHLORIDE: 124 mmol/L — AB (ref 98–111)
CO2: 13 mmol/L — ABNORMAL LOW (ref 22–32)
CREATININE: 1.39 mg/dL — AB (ref 0.61–1.24)
Calcium: 7.7 mg/dL — ABNORMAL LOW (ref 8.9–10.3)
GFR calc Af Amer: 56 mL/min — ABNORMAL LOW (ref >60)
GFR, EST NON AFRICAN AMERICAN: 48 mL/min — AB (ref >60)
Glucose, Bld: 180 mg/dL — ABNORMAL HIGH (ref 70–99)
Potassium: 4.4 mmol/L (ref 3.5–5.1)
Sodium: 147 mmol/L — ABNORMAL HIGH (ref 135–145)
Total Bilirubin: 2.9 mg/dL — ABNORMAL HIGH (ref 0.3–1.2)
Total Protein: 5.7 g/dL — ABNORMAL LOW (ref 6.5–8.1)

## 2017-12-31 LAB — CBC
HCT: 28.8 % — ABNORMAL LOW (ref 39.0–52.0)
Hemoglobin: 8.8 g/dL — ABNORMAL LOW (ref 13.0–17.0)
MCH: 27.4 pg (ref 26.0–34.0)
MCHC: 30.6 g/dL (ref 30.0–36.0)
MCV: 89.7 fL (ref 80.0–100.0)
PLATELETS: 91 10*3/uL — AB (ref 150–400)
RBC: 3.21 MIL/uL — ABNORMAL LOW (ref 4.22–5.81)
RDW: 17.2 % — AB (ref 11.5–15.5)
WBC: 11.1 10*3/uL — ABNORMAL HIGH (ref 4.0–10.5)
nRBC: 0 % (ref 0.0–0.2)

## 2017-12-31 LAB — CULTURE, BLOOD (ROUTINE X 2)

## 2017-12-31 LAB — BASIC METABOLIC PANEL
Anion gap: 7 (ref 5–15)
BUN: 40 mg/dL — AB (ref 8–23)
CHLORIDE: 123 mmol/L — AB (ref 98–111)
CO2: 17 mmol/L — ABNORMAL LOW (ref 22–32)
Calcium: 7.8 mg/dL — ABNORMAL LOW (ref 8.9–10.3)
Creatinine, Ser: 1.32 mg/dL — ABNORMAL HIGH (ref 0.61–1.24)
GFR calc Af Amer: 59 mL/min — ABNORMAL LOW (ref >60)
GFR calc non Af Amer: 51 mL/min — ABNORMAL LOW (ref >60)
GLUCOSE: 230 mg/dL — AB (ref 70–99)
Potassium: 3.9 mmol/L (ref 3.5–5.1)
SODIUM: 147 mmol/L — AB (ref 135–145)

## 2017-12-31 LAB — MAGNESIUM: Magnesium: 2.2 mg/dL (ref 1.7–2.4)

## 2017-12-31 LAB — GLUCOSE, CAPILLARY
GLUCOSE-CAPILLARY: 214 mg/dL — AB (ref 70–99)
Glucose-Capillary: 148 mg/dL — ABNORMAL HIGH (ref 70–99)
Glucose-Capillary: 169 mg/dL — ABNORMAL HIGH (ref 70–99)
Glucose-Capillary: 160 mg/dL — ABNORMAL HIGH (ref 70–99)
Glucose-Capillary: 164 mg/dL — ABNORMAL HIGH (ref 70–99)
Glucose-Capillary: 197 mg/dL — ABNORMAL HIGH (ref 70–99)

## 2017-12-31 MED ORDER — DEXTROSE 5 % IV SOLN
INTRAVENOUS | Status: DC
  Administered 2017-12-31 – 2018-01-01 (×2): via INTRAVENOUS

## 2017-12-31 MED ORDER — HYDRALAZINE HCL 20 MG/ML IJ SOLN
10.0000 mg | INTRAMUSCULAR | Status: DC | PRN
  Administered 2018-01-01: 10 mg via INTRAVENOUS
  Filled 2017-12-31 (×2): qty 1

## 2017-12-31 NOTE — Progress Notes (Signed)
[facility]   Reason for visit: Follow up on bacteremia  Interval History: no acute changes.  Patient does not answer questions.  Tmax 100.4 last 24 hours, WBC 11.1.     Vancomycin day 4 Rifampin day 2 Previous piptazo and cefepime  Physical Exam: Constitutional:  Vitals:   12/31/17 0443 12/31/17 0538  BP: (!) 176/115 (!) 155/97  Pulse: (!) 109 86  Resp: (!) 23 (!) 31  Temp:    SpO2: 100% 100%   patient appears in NAD, sleeping, arousable with deep stimulation but does not answer questions Eyes: anicteric Respiratory: Normal respiratory effort; CTA B Cardiovascular: RRR GI: soft, nt, nd MS: knees without effusion  Review of Systems: Unable to be assessed due to mental status  Lab Results  Component Value Date   WBC 11.1 (H) 12/31/2017   HGB 8.8 (L) 12/31/2017   HCT 28.8 (L) 12/31/2017   MCV 89.7 12/31/2017   PLT 91 (L) 12/31/2017    Lab Results  Component Value Date   CREATININE 1.39 (H) 12/31/2017   BUN 39 (H) 12/31/2017   NA 147 (H) 12/31/2017   K 4.4 12/31/2017   CL 124 (H) 12/31/2017   CO2 13 (L) 12/31/2017    Lab Results  Component Value Date   ALT 23 12/31/2017   AST 45 (H) 12/31/2017   ALKPHOS 71 12/31/2017     Microbiology: Recent Results (from the past 240 hour(s))  Blood Culture (routine x 2)     Status: Abnormal   Collection Time: 12/28/17 10:32 PM  Result Value Ref Range Status   Specimen Description BLOOD RIGHT ANTECUBITAL  Final   Special Requests   Final    BOTTLES DRAWN AEROBIC AND ANAEROBIC Blood Culture results may not be optimal due to an excessive volume of blood received in culture bottles   Culture  Setup Time   Final    GRAM POSITIVE COCCI IN BOTH AEROBIC AND ANAEROBIC BOTTLES CRITICAL RESULT CALLED TO, READ BACK BY AND VERIFIED WITH: Antoine Primas PharmD 10:45 12/29/17 ([email or web address]) Performed at [facility], [address] [place], [place] [address]    Culture METHICILLIN RESISTANT STAPHYLOCOCCUS  AUREUS (A)  Final   Report Status 12/31/2017 FINAL  Final   Organism ID, Bacteria METHICILLIN RESISTANT STAPHYLOCOCCUS AUREUS  Final      Susceptibility   Methicillin resistant staphylococcus aureus - MIC*    CIPROFLOXACIN <=0.5 SENSITIVE Sensitive     ERYTHROMYCIN >=8 RESISTANT Resistant     GENTAMICIN <=0.5 SENSITIVE Sensitive     OXACILLIN RESISTANT Resistant     TETRACYCLINE <=1 SENSITIVE Sensitive     VANCOMYCIN <=0.5 SENSITIVE Sensitive     TRIMETH/SULFA <=10 SENSITIVE Sensitive     CLINDAMYCIN >=8 RESISTANT Resistant     RIFAMPIN <=0.5 SENSITIVE Sensitive     Inducible Clindamycin NEGATIVE Sensitive     * METHICILLIN RESISTANT STAPHYLOCOCCUS AUREUS  Blood Culture ID Panel (Reflexed)     Status: Abnormal   Collection Time: 12/28/17 10:32 PM  Result Value Ref Range Status   Enterococcus species NOT DETECTED NOT DETECTED Final   Listeria monocytogenes NOT DETECTED NOT DETECTED Final   Staphylococcus species DETECTED (A) NOT DETECTED Final    Comment: CRITICAL RESULT CALLED TO, READ BACK BY AND VERIFIED WITH: Antoine Primas PharmD 10:45 12/29/17 ([email or web address])    Staphylococcus aureus (BCID) DETECTED (A) NOT DETECTED Final    Comment: Methicillin (oxacillin)-resistant Staphylococcus aureus (MRSA). MRSA is predictably resistant to beta-lactam antibiotics (except ceftaroline).  Preferred therapy is vancomycin unless clinically contraindicated. Patient requires contact precautions if  hospitalized. CRITICAL RESULT CALLED TO, READ BACK BY AND VERIFIED WITH: Antoine Primas PharmD 10:45 12/29/17 ([email or web address])    Methicillin resistance DETECTED (A) NOT DETECTED Final    Comment: CRITICAL RESULT CALLED TO, READ BACK BY AND VERIFIED WITH: Antoine Primas PharmD 10:45 12/29/17 ([email or web address])    Streptococcus species NOT DETECTED NOT DETECTED Final   Streptococcus agalactiae NOT DETECTED NOT DETECTED Final   Streptococcus pneumoniae NOT DETECTED NOT DETECTED Final   Streptococcus pyogenes NOT DETECTED NOT DETECTED  Final   Acinetobacter baumannii NOT DETECTED NOT DETECTED Final   Enterobacteriaceae species NOT DETECTED NOT DETECTED Final   Enterobacter cloacae complex NOT DETECTED NOT DETECTED Final   Escherichia coli NOT DETECTED NOT DETECTED Final   Klebsiella oxytoca NOT DETECTED NOT DETECTED Final   Klebsiella pneumoniae NOT DETECTED NOT DETECTED Final   Proteus species NOT DETECTED NOT DETECTED Final   Serratia marcescens NOT DETECTED NOT DETECTED Final   Haemophilus influenzae NOT DETECTED NOT DETECTED Final   Neisseria meningitidis NOT DETECTED NOT DETECTED Final   Pseudomonas aeruginosa NOT DETECTED NOT DETECTED Final   Candida albicans NOT DETECTED NOT DETECTED Final   Candida glabrata NOT DETECTED NOT DETECTED Final   Candida krusei NOT DETECTED NOT DETECTED Final   Candida parapsilosis NOT DETECTED NOT DETECTED Final   Candida tropicalis NOT DETECTED NOT DETECTED Final    Comment: Performed at [facility], [address], [place], [place] [address]  Blood Culture (routine x 2)     Status: Abnormal   Collection Time: 12/28/17 11:18 PM  Result Value Ref Range Status   Specimen Description BLOOD RIGHT HAND  Final   Special Requests   Final    BOTTLES DRAWN AEROBIC AND ANAEROBIC Blood Culture results may not be optimal due to an inadequate volume of blood received in culture bottles   Culture  Setup Time   Final    GRAM POSITIVE COCCI IN CLUSTERS IN BOTH AEROBIC AND ANAEROBIC BOTTLES CRITICAL RESULT CALLED TO, READ BACK BY AND VERIFIED WITH: Antoine Primas PharmD 10:45 12/29/17 ([email or web address])    Culture (A)  Final    STAPHYLOCOCCUS AUREUS SUSCEPTIBILITIES PERFORMED ON PREVIOUS CULTURE WITHIN THE LAST 5 DAYS. Performed at [facility], [address], [place], [place] [address]    Report Status 12/31/2017 FINAL  Final  Urine culture     Status: Abnormal (Preliminary result)   Collection Time: 12/29/17 12:03 AM  Result Value Ref Range Status   Specimen Description URINE, RANDOM   Final   Special Requests Immunocompromised  Final   Culture (A)  Final    20,000 COLONIES/mL ENTEROCOCCUS FAECALIS 10,000 COLONIES/mL STAPHYLOCOCCUS AUREUS SUSCEPTIBILITIES TO FOLLOW Performed at [facility], [address] [place], [place] [address]    Report Status PENDING  Incomplete  Culture, blood (routine x 2)     Status: None (Preliminary result)   Collection Time: 12/30/17  8:12 AM  Result Value Ref Range Status   Specimen Description BLOOD LEFT ANTECUBITAL  Final   Special Requests   Final    BOTTLES DRAWN AEROBIC ONLY Blood Culture adequate volume   Culture   Final    NO GROWTH 1 DAY Performed at [facility], [address] [place], [place] [address]    Report Status PENDING  Incomplete  Culture, blood (routine x 2)     Status: None (Preliminary result)   Collection Time: 12/30/17  8:15 AM  Result Value Ref Range Status   Specimen Description BLOOD BLOOD LEFT HAND  Final   Special Requests   Final    BOTTLES DRAWN AEROBIC ONLY Blood Culture adequate volume   Culture   Final    NO GROWTH 1 DAY Performed at [facility], [address], [place], [place] [address]    Report Status PENDING  Incomplete    Impression/Plan:  1. MRSA bacteremia - on vancomycin and rifampin due to prosthetic valve.  Repeat blood cultures have been sent and ngtd.  2. Bioprosthetic valve - with #1, high concern for endocarditis and will need TEE.    3.  Fever - curve improving.    4. AMS - somnolent now which is different than his baseline.  Likely from #1.

## 2017-12-31 NOTE — Progress Notes (Signed)
Removed Foley.  Unable to put condom catheter on patient.  Will monitor patient to see if he is able to void.

## 2017-12-31 NOTE — Progress Notes (Signed)
PROGRESS NOTE   Peter Weiss  VEH:[id number]    DOB: 05/30/1940    DOA: 12/28/2017  PCP: Nolene Ebbs, MD   I have briefly reviewed patients previous medical records in [facility]  Brief Narrative:  77 year old male reportedly lives at an extended stay hotel off of [facility] reportedly independent with his ADLs at baseline (normally alert and oriented and walks his dog daily), PMH of aortic valve stenosis, status post AVR (bovine valve), A. fib not on anticoagulation, chronic diastolic CHF, DM 2, dementia, lumbar DDD, AAA, HTN, HLD, depression, BPH, GI bleeding, alcohol use, EMS was called out by security who had to use forced entry to enter patient's room where he was found in bed covered in urine and brought to ED for altered mental status.  He was unable to provide any history in the ED.  He was admitted for MRSA bacteremia/sepsis, suspected acute cholecystitis and acute encephalopathy.  Cardiology consulted due to elevated troponins.  General surgery/IR consulted for acute cholecystitis.  ID consulted for MRSA bacteremia.  Improving.  Assessment & Plan:   Principal Problem:   MRSA bacteremia Active Problems:   Type II diabetes mellitus with renal manifestations (HCC)   Aortic stenosis   Atrial fibrillation, chronic   Essential hypertension   Dementia (HCC)   Normocytic anemia   Sepsis ([place])   Acute cholecystitis   CKD (chronic kidney disease), stage III (HCC)   Chronic diastolic CHF (congestive heart failure) (HCC)   BPH (benign prostatic hyperplasia)   Acute metabolic encephalopathy   Hypokalemia   Elevated troponin   Sepsis due to MRSA bacteremia Met sepsis criteria on admission.  Blood cultures x2 and BCID show MRSA.  Treated per sepsis protocol with IV fluids and initiated on IV vancomycin and Zosyn.  ID consultation and follow-up appreciated.  Source of infection unclear at this time.  TTE was limited study and showed no vegetations.  Zosyn  discontinued.  Continue vancomycin and ID added rifampicin until endocarditis ruled out.  Cardiology consulted for TEE, likely for next week.   Cholecystitis ruled out CT abdomen and pelvis with contrast 10/17: Gallbladder wall thickening and edema with surrounding infiltration suggesting acute cholecystitis.  General surgery was consulted.  HIDA scan was normal.  Cholecystitis ruled out.  Acute toxic metabolic encephalopathy Exact etiology not clear but possibly related to sepsis.  No acute intracranial abnormality noted.  No focal deficits.  Delirium precautions.  Alcohol use listed but amounts not known.  UDS negative.  Ammonia levels normal.  Avoid QT prolonging medications due to prolonged QTC. CIWA protocol.   Sleepy again this morning but easily arousable, oriented to self and place.  Has not received any Ativan all day 10/18 and since then.  Has not required sitter overnight.  Continue to monitor mental status as day progresses.  A. fib with mild RVR CHA2DS2-VASc Score is 5.  Patient reportedly not anticoagulation candidate due to history of GI bleed.  Since NPO/AMS, continue IV metoprolol 5 mg 3 times daily.  Monitor on telemetry.  Controlled ventricular rate in the 100s.  Transition to oral medications when consistently alert.  Elevated troponin: No report of chest pain.  Mild increased from 0.16-0.26 but plateaued thereafter.  Could be demand ischemia from sepsis.  Cardiology consulted but likely supportive care.  Continue rectal aspirin.  TTE shows normal EF.  Requested Cardiology for TEE to rule out endocarditis.  Aortic stenosis status post AVR Limited TTE without obvious vegetations.  Cardiology  consulted for TEE.  DM 2 with renal manifestations A1c 7.1.  Continue SSI with close monitoring.  Reasonable inpatient control.  Essential hypertension Uncontrolled.  Continue scheduled IV metoprolol.  Add PRN IV hydralazine.  Dementia Holding home Aricept.  Normocytic  anemia Hemoglobin has dropped from 9-7.8.  No overt bleeding.  Could be dilutional versus critical illness.  Transfuse if hemoglobin 7 or less.  Do not see CBC results for today, was ordered.  Request stat.  Thrombocytopenia Could be related to sepsis.  No bleeding reported.  Hemoglobin has dropped further from 95-78.  Repeat CBC and follow.  Acute on stage III chronic kidney disease Baseline creatinine not clear but may be in the 1-1.3 range.  Presented with creatinine of 1.4 and stable in that range.  Creatinine had increased to 1.51 but down to 1.39.  Continue to trend BMP daily.  Chronic diastolic CHF: TTE 04/10/5168 showed LVEF 65-70%.  Does not appear volume overloaded.  Hold Lasix.  Continue gentle IV fluids until able to take p.o.  BPH Holding Flomax.  Foley catheter placed in ED, indication not clear.  DC Foley catheter and check for voiding.  Hypokalemia Replaced.  Hypomagnesemia Magnesium 1.2 on admission.  Replaced.  Metabolic acidosis/elevated lactate Peak lactate 3.12.  Lactate has normalized.  Bicarbonate down to 13, anion gap 10.  Likely due to hyperchloremia.  DC normal saline and change to D5 infusion.  Follow BMP later this evening and in a.m.  Prolonged QTC Replace hypokalemia and hypomagnesemia.  Monitor on telemetry.  Avoid QT prolonging medications.  EKG 12/29/2017: RBBB, QTC 533 (not sure if accurate due to BBB).  Stable mass within the pituitary fossa:  Noted on CT head 10/17.  Mild mass-effect on the optic chiasm and prechiasmatic optic nerves.  Unclear etiology.  Outpatient follow-up.  Obesity/Body mass index is 33.45 kg/m.   Hypernatremia Possibly from NS hydration.  DC NS.  Dextrose infusion and follow.    DVT prophylaxis: SCDs Code Status: Full Family Communication: Unable to reach anyone on patient's behalf. Disposition: To be determined.  Admit to stepdown unit for close monitoring and management.   Consultants:  General  surgery Interventional radiology Cardiology Infectious disease  Procedures:  Foley catheter 10/17 > 10/19  Antimicrobials:  IV vancomycin 10/17 > IV rifampin 10/19 > Zosyn discontinued   Subjective: Patient interviewed and examined along with his RN in room.  Somnolent but arousable.  Oriented to self and place.  Follows simple instructions.  Drifts back to sleep.  As per RN, occasionally will wake up in yell out but no agitation and has not required sitter overnight/could not find one and no Ativan given since yesterday.  ROS: Unable.  Objective:  Vitals:   12/31/17 0110 12/31/17 0243 12/31/17 0443 12/31/17 0538  BP: (!) 186/116 (!) 173/104 (!) 176/115 (!) 155/97  Pulse: (!) 109 (!) 109 (!) 109 86  Resp: (!) 35 (!) 32 (!) 23 (!) 31  Temp:      TempSrc:      SpO2: 100% 100% 100% 100%  Weight:      Height:        Examination:  General exam: Elderly male, moderately built and obese, lying comfortably propped up in bed without distress.  Oral mucosa with borderline hydration.  Needs mouth care. Respiratory system: Midline sternotomy scar.  Clear to auscultation without wheezing, rhonchi or crackles.  No increased work of breathing. Cardiovascular system: S1 & S2 heard, irregularly irregular. No JVD, murmurs, rubs, gallops or clicks.  No pedal edema.  Telemetry personally reviewed: Sinus tachycardia with BBB morphology in the 100s. Gastrointestinal system: Abdomen is nondistended but obese, soft and nontender. No organomegaly or masses felt. Normal bowel sounds heard.  Stable Central nervous system: Mental status as above. No focal neurological deficits. Extremities: Symmetric 5 x 5 power.  Moves all limbs symmetrically and actively. Skin: Generalized multiple skin ulcers at different stages of healing but none appear acutely infected. Psychiatry: Judgement and insight impaired. Mood & affect cannot be assessed. Neck: supple.     Data Reviewed: I have personally reviewed  following labs and imaging studies  CBC: Recent Labs  Lab 12/28/17 2232 12/29/17 0254 12/29/17 0753 12/30/17 0812  WBC 14.0* 10.7* 13.7* 8.6  NEUTROABS 13.2*  --   --   --   HGB 10.2* 8.5* 9.0* 7.8*  HCT 33.0* 28.1* 30.6* 25.5*  MCV 88.9 88.9 90.5 89.5  PLT 166 120* 95* 78*   Basic Metabolic Panel: Recent Labs  Lab 12/28/17 2232 12/28/17 2318 12/29/17 0254 12/29/17 0753 12/30/17 0812 12/31/17 0607  NA 135  --  137 136 143 147*  K 2.8*  --  2.8* 3.5 3.6 4.4  CL 96*  --  106 107 118* 124*  CO2 21*  --  15* 17* 20* 13*  GLUCOSE 227*  --  220* 255* 157* 180*  BUN 20  --  21 23 34* 39*  CREATININE 1.45*  --  1.40* 1.48* 1.51* 1.39*  CALCIUM 8.8*  --  7.7* 7.5* 7.5* 7.7*  MG  --  1.2*  --  1.6*  --  2.2   Liver Function Tests: Recent Labs  Lab 12/28/17 2232 12/29/17 0254 12/29/17 0753 12/30/17 0812 12/31/17 0607  AST 23 24 34 26 45*  ALT 13 12 15 15 23   ALKPHOS 45 33* 35* 34* 71  BILITOT 1.9* 2.0* 1.9* 1.0 2.9*  PROT 7.3 5.8* 6.1* 5.3* 5.7*  ALBUMIN 3.3* 2.7* 2.7* 2.3* 2.4*   Coagulation Profile: Recent Labs  Lab 12/28/17 2318  INR 1.59   Cardiac Enzymes: Recent Labs  Lab 12/28/17 2318 12/29/17 0254 12/29/17 0753 12/29/17 1559  TROPONINI 0.14* 0.26* 0.24* 0.18*   HbA1C: Recent Labs    12/29/17 0254  HGBA1C 7.1*   CBG: Recent Labs  Lab 12/30/17 1817 12/30/17 2032 12/31/17 0057 12/31/17 0516 12/31/17 0805  GLUCAP 151* 151* 148* 169* 160*    Recent Results (from the past 240 hour(s))  Blood Culture (routine x 2)     Status: Abnormal   Collection Time: 12/28/17 10:32 PM  Result Value Ref Range Status   Specimen Description BLOOD RIGHT ANTECUBITAL  Final   Special Requests   Final    BOTTLES DRAWN AEROBIC AND ANAEROBIC Blood Culture results may not be optimal due to an excessive volume of blood received in culture bottles   Culture  Setup Time   Final    GRAM POSITIVE COCCI IN BOTH AEROBIC AND ANAEROBIC BOTTLES CRITICAL RESULT CALLED  TO, READ BACK BY AND VERIFIED WITH: Ferne Coe PharmD 10:45 12/29/17 ([email or web address]) Performed at [facility], [address] [place], [place] [address]    Culture METHICILLIN RESISTANT STAPHYLOCOCCUS AUREUS (A)  Final   Report Status 12/31/2017 FINAL  Final   Organism ID, Bacteria METHICILLIN RESISTANT STAPHYLOCOCCUS AUREUS  Final      Susceptibility   Methicillin resistant staphylococcus aureus - MIC*    CIPROFLOXACIN <=0.5 SENSITIVE Sensitive     ERYTHROMYCIN >=8 RESISTANT Resistant     GENTAMICIN <=  0.5 SENSITIVE Sensitive     OXACILLIN RESISTANT Resistant     TETRACYCLINE <=1 SENSITIVE Sensitive     VANCOMYCIN <=0.5 SENSITIVE Sensitive     TRIMETH/SULFA <=10 SENSITIVE Sensitive     CLINDAMYCIN >=8 RESISTANT Resistant     RIFAMPIN <=0.5 SENSITIVE Sensitive     Inducible Clindamycin NEGATIVE Sensitive     * METHICILLIN RESISTANT STAPHYLOCOCCUS AUREUS  Blood Culture ID Panel (Reflexed)     Status: Abnormal   Collection Time: 12/28/17 10:32 PM  Result Value Ref Range Status   Enterococcus species NOT DETECTED NOT DETECTED Final   Listeria monocytogenes NOT DETECTED NOT DETECTED Final   Staphylococcus species DETECTED (A) NOT DETECTED Final    Comment: CRITICAL RESULT CALLED TO, READ BACK BY AND VERIFIED WITH: Ferne Coe PharmD 10:45 12/29/17 ([email or web address])    Staphylococcus aureus (BCID) DETECTED (A) NOT DETECTED Final    Comment: Methicillin (oxacillin)-resistant Staphylococcus aureus (MRSA). MRSA is predictably resistant to beta-lactam antibiotics (except ceftaroline). Preferred therapy is vancomycin unless clinically contraindicated. Patient requires contact precautions if  hospitalized. CRITICAL RESULT CALLED TO, READ BACK BY AND VERIFIED WITH: Ferne Coe PharmD 10:45 12/29/17 ([email or web address])    Methicillin resistance DETECTED (A) NOT DETECTED Final    Comment: CRITICAL RESULT CALLED TO, READ BACK BY AND VERIFIED WITH: Ferne Coe PharmD 10:45 12/29/17 ([email or web address])    Streptococcus species  NOT DETECTED NOT DETECTED Final   Streptococcus agalactiae NOT DETECTED NOT DETECTED Final   Streptococcus pneumoniae NOT DETECTED NOT DETECTED Final   Streptococcus pyogenes NOT DETECTED NOT DETECTED Final   Acinetobacter baumannii NOT DETECTED NOT DETECTED Final   Enterobacteriaceae species NOT DETECTED NOT DETECTED Final   Enterobacter cloacae complex NOT DETECTED NOT DETECTED Final   Escherichia coli NOT DETECTED NOT DETECTED Final   Klebsiella oxytoca NOT DETECTED NOT DETECTED Final   Klebsiella pneumoniae NOT DETECTED NOT DETECTED Final   Proteus species NOT DETECTED NOT DETECTED Final   Serratia marcescens NOT DETECTED NOT DETECTED Final   Haemophilus influenzae NOT DETECTED NOT DETECTED Final   Neisseria meningitidis NOT DETECTED NOT DETECTED Final   Pseudomonas aeruginosa NOT DETECTED NOT DETECTED Final   Candida albicans NOT DETECTED NOT DETECTED Final   Candida glabrata NOT DETECTED NOT DETECTED Final   Candida krusei NOT DETECTED NOT DETECTED Final   Candida parapsilosis NOT DETECTED NOT DETECTED Final   Candida tropicalis NOT DETECTED NOT DETECTED Final    Comment: Performed at [facility], [address] [place], [place] [address]  Blood Culture (routine x 2)     Status: Abnormal   Collection Time: 12/28/17 11:18 PM  Result Value Ref Range Status   Specimen Description BLOOD RIGHT HAND  Final   Special Requests   Final    BOTTLES DRAWN AEROBIC AND ANAEROBIC Blood Culture results may not be optimal due to an inadequate volume of blood received in culture bottles   Culture  Setup Time   Final    GRAM POSITIVE COCCI IN CLUSTERS IN BOTH AEROBIC AND ANAEROBIC BOTTLES CRITICAL RESULT CALLED TO, READ BACK BY AND VERIFIED WITH: Ferne Coe PharmD 10:45 12/29/17 ([email or web address])    Culture (A)  Final    STAPHYLOCOCCUS AUREUS SUSCEPTIBILITIES PERFORMED ON PREVIOUS CULTURE WITHIN THE LAST 5 DAYS. Performed at [facility], [place] [address] [place], [place] [address]     Report Status 12/31/2017 FINAL  Final  Urine culture     Status: None (Preliminary result)   Collection Time: 12/29/17 12:03 AM  Result Value  Ref Range Status   Specimen Description URINE, RANDOM  Final   Special Requests Immunocompromised  Final   Culture   Final    CULTURE REINCUBATED FOR BETTER GROWTH Performed at [facility], [place] [address], [place], [place] [address]    Report Status PENDING  Incomplete  Culture, blood (routine x 2)     Status: None (Preliminary result)   Collection Time: 12/30/17  8:12 AM  Result Value Ref Range Status   Specimen Description BLOOD LEFT ANTECUBITAL  Final   Special Requests   Final    BOTTLES DRAWN AEROBIC ONLY Blood Culture adequate volume   Culture   Final    NO GROWTH 1 DAY Performed at [facility], [place] [address] [place], [place] [address]    Report Status PENDING  Incomplete  Culture, blood (routine x 2)     Status: None (Preliminary result)   Collection Time: 12/30/17  8:15 AM  Result Value Ref Range Status   Specimen Description BLOOD BLOOD LEFT HAND  Final   Special Requests   Final    BOTTLES DRAWN AEROBIC ONLY Blood Culture adequate volume   Culture   Final    NO GROWTH 1 DAY Performed at [facility], [place] [address] [place] [address], [place], [place] [address]    Report Status PENDING  Incomplete         Radiology Studies: Nm Hepatobiliary Liver Func  Result Date: 12/29/2017 CLINICAL DATA:  Right upper quadrant pain.  Suspected cholecystitis. EXAM: NUCLEAR MEDICINE HEPATOBILIARY IMAGING TECHNIQUE: Sequential images of the abdomen were obtained out to 60 minutes following intravenous administration of radiopharmaceutical. RADIOPHARMACEUTICALS:  5.1 mCi Tc-48m Choletec IV COMPARISON:  CT 12/29/2017. FINDINGS: Prompt uptake and biliary excretion of activity by the liver is seen. Gallbladder activity is visualized, consistent with patency of cystic duct. Biliary activity passes into small bowel, consistent with patent  common bile duct. IMPRESSION: Normal exam.  Gallbladder visualizes normally. Electronically Signed   By: [place]  On: 12/29/2017 15:04        Scheduled Meds: . aspirin  300 mg Rectal Daily  . folic acid  1 mg Oral Daily  . insulin aspart  0-9 Units Subcutaneous Q4H  . metoprolol tartrate  5 mg Intravenous Q8H  . multivitamin with minerals  1 tablet Oral Daily  . thiamine injection  100 mg Intravenous Daily   Continuous Infusions: . rifampin (RIFADIN) IVPB 300 mg (12/30/17 2228)  . vancomycin 166.7 mL/hr at 12/30/17 1900     LOS: 2 days     AVernell Leep MD, [place] [facility] Pager [phone] If 7PM-7AM, please contact night-coverage [email or web address] Password [email or web address] 12/31/2017, 11:19 AM

## 2017-12-31 NOTE — Progress Notes (Signed)
Pt. Able to void.  Voided a large amount of urine in bed since Foley has been removed.  Tried to place condom cath for more accurate output measurements but condom catheter will not stay ion place.

## 2018-01-01 ENCOUNTER — Inpatient Hospital Stay (HOSPITAL_COMMUNITY): Payer: Medicare Other

## 2018-01-01 LAB — GLUCOSE, CAPILLARY
GLUCOSE-CAPILLARY: 187 mg/dL — AB (ref 70–99)
GLUCOSE-CAPILLARY: 211 mg/dL — AB (ref 70–99)
GLUCOSE-CAPILLARY: 215 mg/dL — AB (ref 70–99)
GLUCOSE-CAPILLARY: 223 mg/dL — AB (ref 70–99)
Glucose-Capillary: 190 mg/dL — ABNORMAL HIGH (ref 70–99)
Glucose-Capillary: 201 mg/dL — ABNORMAL HIGH (ref 70–99)
Glucose-Capillary: 228 mg/dL — ABNORMAL HIGH (ref 70–99)

## 2018-01-01 LAB — URINE CULTURE

## 2018-01-01 LAB — BASIC METABOLIC PANEL
Anion gap: 7 (ref 5–15)
BUN: 38 mg/dL — AB (ref 8–23)
CALCIUM: 8 mg/dL — AB (ref 8.9–10.3)
CO2: 18 mmol/L — ABNORMAL LOW (ref 22–32)
CREATININE: 1.21 mg/dL (ref 0.61–1.24)
Chloride: 122 mmol/L — ABNORMAL HIGH (ref 98–111)
GFR calc Af Amer: >60 mL/min (ref >60)
GFR calc non Af Amer: 57 mL/min — ABNORMAL LOW (ref >60)
Glucose, Bld: 223 mg/dL — ABNORMAL HIGH (ref 70–99)
Potassium: 3.8 mmol/L (ref 3.5–5.1)
SODIUM: 147 mmol/L — AB (ref 135–145)

## 2018-01-01 LAB — CBC
HCT: 29.7 % — ABNORMAL LOW (ref 39.0–52.0)
Hemoglobin: 8.8 g/dL — ABNORMAL LOW (ref 13.0–17.0)
MCH: 26.2 pg (ref 26.0–34.0)
MCHC: 29.6 g/dL — ABNORMAL LOW (ref 30.0–36.0)
MCV: 88.4 fL (ref 80.0–100.0)
Platelets: 94 10*3/uL — ABNORMAL LOW (ref 150–400)
RBC: 3.36 MIL/uL — ABNORMAL LOW (ref 4.22–5.81)
RDW: 17.4 % — ABNORMAL HIGH (ref 11.5–15.5)
WBC: 13.6 10*3/uL — AB (ref 4.0–10.5)
nRBC: 0 % (ref 0.0–0.2)

## 2018-01-01 LAB — HIV ANTIBODY (ROUTINE TESTING W REFLEX): HIV Screen 4th Generation wRfx: NONREACTIVE

## 2018-01-01 LAB — VANCOMYCIN, TROUGH: VANCOMYCIN TR: 9 ug/mL — AB (ref 15–20)

## 2018-01-01 MED ORDER — DEXTROSE 5 % IV SOLN
INTRAVENOUS | Status: DC
  Administered 2018-01-01 – 2018-01-02 (×2): via INTRAVENOUS

## 2018-01-01 MED ORDER — VANCOMYCIN HCL IN DEXTROSE 1-5 GM/200ML-% IV SOLN
1000.0000 mg | Freq: Two times a day (BID) | INTRAVENOUS | Status: DC
  Administered 2018-01-02 – 2018-01-04 (×5): 1000 mg via INTRAVENOUS
  Filled 2018-01-01 (×6): qty 200

## 2018-01-01 MED ORDER — POTASSIUM CHLORIDE 10 MEQ/100ML IV SOLN
10.0000 meq | INTRAVENOUS | Status: AC
  Administered 2018-01-01 – 2018-01-02 (×6): 10 meq via INTRAVENOUS
  Filled 2018-01-01 (×6): qty 100

## 2018-01-01 NOTE — Progress Notes (Signed)
Pharmacy Antibiotic Note  Peter Weiss is a 77 y.o. male admitted on 12/28/2017 with AMS and found to have MRSA bacteremia. Awaiting TEE later this week to r/o IE - Rifampin has been added in the meantime. Pharmacy has been consulted for Vancomycin dosing.   A Vancomycin trough this evening resulted as SUBtherapeutic (VT 9 mcg/ml, goal of 15-20 mcg/ml). Renal function has been improving with SCr down to 1.21, nCrCl~50-55 ml/min.   Plan: - Adjust Vancomycin to 1g IV every 12 hours - Will follow-up with a repeat trough level at steady state - Will continue to follow renal function, culture results, LOT, and antibiotic de-escalation plans   Height: 6\' 1"  (185.4 cm) Weight: 253 lb 8.5 oz (115 kg) IBW/kg (Calculated) : 79.9  Temp (24hrs), Avg:99.3 F (37.4 C), Min:97.8 F (36.6 C), Max:100.4 F (38 C)  Recent Labs  Lab 12/28/17 2242 12/29/17 0004 12/29/17 0254 12/29/17 0531 12/29/17 0753 12/29/17 2109 12/30/17 0812 12/31/17 0607 12/31/17 1205 12/31/17 1629 01/01/18 0347  WBC  --   --  10.7*  --  13.7*  --  8.6  --  11.1*  --  13.6*  CREATININE  --   --  1.40*  --  1.48*  --  1.51* 1.39*  --  1.32* 1.21  LATICACIDVEN 2.50* 3.12* 2.1* 2.3*  --  1.4  --   --   --   --   --     Estimated Creatinine Clearance: 70.1 mL/min (by C-G formula based on SCr of 1.21 mg/dL).    No Known Allergies  Antimicrobials this admission: Cefepime 10/16 x 1 Vanc 10/16 >> Zosyn 10/16 >> 10/17 Rifampin 10/18 >>  Dose adjustments this admission: 10/20 VT 9 mcg/ml on 1250 mg/24h >> increase to 1g/12h  Microbiology results: 10/16 BCx >> 2/2 MRSA BCID MRSA 10/17 UCx >> 20k E faecalis, 10k SA 10/18 BCx: ngtd  Thank you for allowing pharmacy to be a part of this patient's care.  Georgina Pillion, PharmD, BCPS Clinical Pharmacist Pager: [phone] Clinical phone for 01/01/2018 from 7a-3:30p: [phone] If after 3:30p, please call main pharmacy at: [phone] Please check AMION for all [facility]  Pharmacy numbers 01/01/2018 3:11 PM

## 2018-01-01 NOTE — Progress Notes (Signed)
Pt mostly sleepy throughout day but alert to self, responds to voice. Pt had brief period during which he had his bath he was A&O x3. Talking in sentences and was able to take oral meds. Pt then became sleepy again, not able to talk in full sentences.

## 2018-01-01 NOTE — Progress Notes (Signed)
PROGRESS NOTE   Peter Weiss  TZG:[id number]    DOB: Dec 12, 1940    DOA: 12/28/2017  PCP: Nolene Ebbs, MD   I have briefly reviewed patients previous medical records in [facility].  Brief Narrative:  77 year old male reportedly lives at an extended stay hotel off of [facility] reportedly independent with his ADLs at baseline (normally alert and oriented and walks his dog daily), PMH of aortic valve stenosis, status post AVR (bovine valve), A. fib not on anticoagulation, chronic diastolic CHF, DM 2, dementia, lumbar DDD, AAA, HTN, HLD, depression, BPH, GI bleeding, alcohol use, EMS was called out by security who had to use forced entry to enter patient's room where he was found in bed covered in urine and brought to ED for altered mental status.  He was unable to provide any history in the ED.  He was admitted for MRSA bacteremia/sepsis, suspected acute cholecystitis and acute encephalopathy.  Cardiology consulted due to elevated troponins.  General surgery/IR consulted for acute cholecystitis.  ID consulted for MRSA bacteremia.  Mental status slow to improve.  Assessment & Plan:   Principal Problem:   MRSA bacteremia Active Problems:   Type II diabetes mellitus with renal manifestations (HCC)   Aortic stenosis   Atrial fibrillation, chronic   Essential hypertension   Dementia (HCC)   Normocytic anemia   Sepsis ([place])   Acute cholecystitis   CKD (chronic kidney disease), stage III (HCC)   Chronic diastolic CHF (congestive heart failure) (HCC)   BPH (benign prostatic hyperplasia)   Acute metabolic encephalopathy   Hypokalemia   Elevated troponin   Sepsis due to MRSA bacteremia Met sepsis criteria on admission.  Blood cultures x2 and BCID show MRSA.  Treated per sepsis protocol with IV fluids and initiated on IV vancomycin and Zosyn.  ID consultation and follow-up appreciated.  Source of infection unclear at this time.  TTE was limited study and showed no  vegetations.  Zosyn discontinued.  Continue vancomycin and ID added rifampicin until endocarditis ruled out.  Cardiology consulted for TEE, likely for next week.  Improving.  T-max 101 in the last 24 hours, mild leukocytosis which is fluctuating.  Continue current management.  Surveillance blood cultures from 10/18: Negative to date.  Urine culture 10/17 shows insignificant growth (20 K Enterococcus faecalis and 10 K MRSA).  Cholecystitis ruled out CT abdomen and pelvis with contrast 10/17: Gallbladder wall thickening and edema with surrounding infiltration suggesting acute cholecystitis.  General surgery was consulted.  HIDA scan was normal.  Cholecystitis ruled out.  Acute toxic metabolic encephalopathy Exact etiology not clear but possibly related to sepsis.  No acute intracranial abnormality noted.  No focal deficits.  Delirium precautions.  Alcohol use listed but amounts not known.  UDS negative.  Ammonia levels normal.  Avoid QT prolonging medications due to prolonged QTC. CIWA protocol.   No significant change in mental status compared to yesterday.  Has not received any Ativan since 10/17.  No agitation and has not required sitter for 2 days.  No CO2 retention on VBG.  Not really progressing.  Check EEG.  Mild hypernatremia probably not cause for all his mental status changes.  A. fib with mild RVR CHA2DS2-VASc Score is 5.  Patient reportedly not anticoagulation candidate due to history of GI bleed.  Since NPO/AMS, continue IV metoprolol 5 mg 3 times daily.  Monitor on telemetry.  Controlled ventricular rate in the 100s.  Not felt safe for oral intake yet.  Elevated troponin: No report of chest pain.  Mild increased from 0.16-0.26 but plateaued thereafter.  Could be demand ischemia from sepsis.  Cardiology consulted but likely supportive care.  Continue rectal aspirin.  TTE shows normal EF.  Requested Cardiology for TEE to rule out endocarditis.  Aortic stenosis status post AVR Limited TTE  without obvious vegetations.  Cardiology consulted for TEE.  DM 2 with renal manifestations A1c 7.1.  CBGs mildly uncontrolled in the low 200s, likely due to D5 infusion.  Continue SSI.  Essential hypertension Uncontrolled.  Continue scheduled IV metoprolol and PRN IV hydralazine.  If this does not control then may have to add PRN IV labetalol.  Unable to start oral meds due to mental status changes.  Dementia Holding home Aricept.  Normocytic anemia Hemoglobin has dropped from 9-7.8.  No overt bleeding.  Could be dilutional versus critical illness.  Transfuse if hemoglobin 7 or less.  Hemoglobin stable in the mid 8 g range..  Thrombocytopenia Could be related to sepsis.  No bleeding reported.  Hemoglobin has dropped further from 95-78.  Platelets have slightly improved to the 90s.  Acute on stage III chronic kidney disease Baseline creatinine not clear but may be in the 1-1.3 range.  Creatinine peaked to 1.51 but has now improved to 1.21.  Chronic diastolic CHF: TTE 8/45/3646 showed LVEF 65-70%.  Does not appear volume overloaded either clinically or radiologically.  Hold Lasix.  Continue gentle IV fluids until able to take p.o. chest x-ray 10/19 personally reviewed, poor inspiratory effort but no overt pulmonary edema.  BPH Holding Flomax.  Foley catheter placed in ED, indication not clear.  Foley catheter discontinued 10/19 and voiding without difficulty.  Hypokalemia Replaced.  Hypomagnesemia Magnesium 1.2 on admission.  Replaced.  Metabolic acidosis/elevated lactate Peak lactate 3.12.  Lactate has normalized.  Bicarbonate down to 13, anion gap 10 on 10/19.  Likely due to hyperchloremia.  Normal saline was discontinued and D5 infusion was started 10/19.  Hyperchloremia slightly better but persistent hyponatremia.  Increase D5 infusion.  Bicarbonate has improved from 13-18.  Prolonged QTC Replace hypokalemia and hypomagnesemia.  Monitor on telemetry.  Avoid QT prolonging  medications.  EKG 12/29/2017: RBBB, QTC 533 (not sure if accurate due to BBB).  Stable mass within the pituitary fossa:  Noted on CT head 10/17.  Mild mass-effect on the optic chiasm and prechiasmatic optic nerves.  Unclear etiology.  Outpatient follow-up.  Obesity/Body mass index is 33.45 kg/m.   Hypernatremia Possibly from NS hydration.  Discontinue normal saline and initiated D5 infusion on 10/19.  No change in sodium.  Increased D5 infusion.  Follow BMP later today and then daily.    DVT prophylaxis: SCDs Code Status: Full Family Communication: Unable to reach anyone on patient's behalf. Disposition: To be determined.  Admit to stepdown unit for close monitoring and management.   Consultants:  General surgery Interventional radiology Cardiology Infectious disease  Procedures:  Foley catheter 10/17 > 10/19  Antimicrobials:  IV vancomycin 10/17 > IV rifampin 10/19 > Zosyn discontinued   Subjective: Patient interviewed and examined along with his RN in room.  Somnolent but briefly opens his eyes, mumbles incomprehensively and drifts back to sleep.  When asked if he is hungry, replies "yes".  As per RN, no other acute issues noted.  ROS: Unable.  Objective:  Vitals:   01/01/18 0534 01/01/18 0543 01/01/18 0547 01/01/18 0620  BP: (!) 181/102 (!) 182/99 (!) 174/94   Pulse:      Resp: (!) 31 Marland Kitchen)  33 (!) 33   Temp:    99.2 F (37.3 C)  TempSrc:    Axillary  SpO2:      Weight:      Height:        Examination:  General exam: Elderly male, moderately built and obese, lying comfortably propped up in bed without distress.  Oral mucosa with borderline hydration, mouth breathing and hence dry. Respiratory system: Midline sternotomy scar.  Clear to auscultation.  No increased work of breathing.  Mild intermittent tachypnea?  Chyne stokes breathing. Cardiovascular system: S1 & S2 heard, irregularly irregular. No JVD, murmurs, rubs, gallops or clicks.  Trace bilateral ankle  edema.  Telemetry personally reviewed: Sinus tachycardia with BBB morphology in the 100s. Gastrointestinal system: Abdomen is nondistended but obese, soft and nontender. No organomegaly or masses felt. Normal bowel sounds heard.  Stable Central nervous system: Mental status as noted above.  Does not follow instructions.  No focal deficits. Extremities: Symmetric 5 x 5 power.  Moves all extremities by himself. Skin: Generalized multiple skin ulcers at different stages of healing but none appear acutely infected. Psychiatry: Judgement and insight impaired. Mood & affect cannot be assessed. Neck: supple.     Data Reviewed: I have personally reviewed following labs and imaging studies  CBC: Recent Labs  Lab 12/28/17 2232 12/29/17 0254 12/29/17 0753 12/30/17 0812 12/31/17 1205 01/01/18 0347  WBC 14.0* 10.7* 13.7* 8.6 11.1* 13.6*  NEUTROABS 13.2*  --   --   --   --   --   HGB 10.2* 8.5* 9.0* 7.8* 8.8* 8.8*  HCT 33.0* 28.1* 30.6* 25.5* 28.8* 29.7*  MCV 88.9 88.9 90.5 89.5 89.7 88.4  PLT 166 120* 95* 78* 91* 94*   Basic Metabolic Panel: Recent Labs  Lab 12/28/17 2318  12/29/17 0753 12/30/17 0812 12/31/17 0607 12/31/17 1629 01/01/18 0347  NA  --    < > 136 143 147* 147* 147*  K  --    < > 3.5 3.6 4.4 3.9 3.8  CL  --    < > 107 118* 124* 123* 122*  CO2  --    < > 17* 20* 13* 17* 18*  GLUCOSE  --    < > 255* 157* 180* 230* 223*  BUN  --    < > 23 34* 39* 40* 38*  CREATININE  --    < > 1.48* 1.51* 1.39* 1.32* 1.21  CALCIUM  --    < > 7.5* 7.5* 7.7* 7.8* 8.0*  MG 1.2*  --  1.6*  --  2.2  --   --    < > = values in this interval not displayed.   Liver Function Tests: Recent Labs  Lab 12/28/17 2232 12/29/17 0254 12/29/17 0753 12/30/17 0812 12/31/17 0607  AST 23 24 34 26 45*  ALT _0 ALKPHOS 45 33* 35* 34* 71  BILITOT 1.9* 2.0* 1.9* 1.0 2.9*  PROT 7.3 5.8* 6.1* 5.3* 5.7*  ALBUMIN 3.3* 2.7* 2.7* 2.3* 2.4*   Coagulation Profile: Recent Labs  Lab  12/28/17 2318  INR 1.59   Cardiac Enzymes: Recent Labs  Lab 12/28/17 2318 12/29/17 0254 12/29/17 0753 12/29/17 1559  TROPONINI 0.14* 0.26* 0.24* 0.18*   HbA1C: No results for input(s): HGBA1C in the last 72 hours. CBG: Recent Labs  Lab 12/31/17 1644 12/31/17 1947 01/01/18 0019 01/01/18 0327 01/01/18 0837  GLUCAP 197* 214* 228* 211* 215*    Recent Results (from the past 240 hour(s))  Blood  Culture (routine x 2)     Status: Abnormal   Collection Time: 12/28/17 10:32 PM  Result Value Ref Range Status   Specimen Description BLOOD RIGHT ANTECUBITAL  Final   Special Requests   Final    BOTTLES DRAWN AEROBIC AND ANAEROBIC Blood Culture results may not be optimal due to an excessive volume of blood received in culture bottles   Culture  Setup Time   Final    GRAM POSITIVE COCCI IN BOTH AEROBIC AND ANAEROBIC BOTTLES CRITICAL RESULT CALLED TO, READ BACK BY AND VERIFIED WITH: Ferne Coe PharmD 10:45 12/29/17 ([email or web address]) Performed at [facility], [address], [place], [place] [address]    Culture METHICILLIN RESISTANT STAPHYLOCOCCUS AUREUS (A)  Final   Report Status 12/31/2017 FINAL  Final   Organism ID, Bacteria METHICILLIN RESISTANT STAPHYLOCOCCUS AUREUS  Final      Susceptibility   Methicillin resistant staphylococcus aureus - MIC*    CIPROFLOXACIN <=0.5 SENSITIVE Sensitive     ERYTHROMYCIN >=8 RESISTANT Resistant     GENTAMICIN <=0.5 SENSITIVE Sensitive     OXACILLIN RESISTANT Resistant     TETRACYCLINE <=1 SENSITIVE Sensitive     VANCOMYCIN <=0.5 SENSITIVE Sensitive     TRIMETH/SULFA <=10 SENSITIVE Sensitive     CLINDAMYCIN >=8 RESISTANT Resistant     RIFAMPIN <=0.5 SENSITIVE Sensitive     Inducible Clindamycin NEGATIVE Sensitive     * METHICILLIN RESISTANT STAPHYLOCOCCUS AUREUS  Blood Culture ID Panel (Reflexed)     Status: Abnormal   Collection Time: 12/28/17 10:32 PM  Result Value Ref Range Status   Enterococcus species NOT DETECTED NOT DETECTED Final    Listeria monocytogenes NOT DETECTED NOT DETECTED Final   Staphylococcus species DETECTED (A) NOT DETECTED Final    Comment: CRITICAL RESULT CALLED TO, READ BACK BY AND VERIFIED WITH: Ferne Coe PharmD 10:45 12/29/17 ([email or web address])    Staphylococcus aureus (BCID) DETECTED (A) NOT DETECTED Final    Comment: Methicillin (oxacillin)-resistant Staphylococcus aureus (MRSA). MRSA is predictably resistant to beta-lactam antibiotics (except ceftaroline). Preferred therapy is vancomycin unless clinically contraindicated. Patient requires contact precautions if  hospitalized. CRITICAL RESULT CALLED TO, READ BACK BY AND VERIFIED WITH: Ferne Coe PharmD 10:45 12/29/17 ([email or web address])    Methicillin resistance DETECTED (A) NOT DETECTED Final    Comment: CRITICAL RESULT CALLED TO, READ BACK BY AND VERIFIED WITH: Ferne Coe PharmD 10:45 12/29/17 ([email or web address])    Streptococcus species NOT DETECTED NOT DETECTED Final   Streptococcus agalactiae NOT DETECTED NOT DETECTED Final   Streptococcus pneumoniae NOT DETECTED NOT DETECTED Final   Streptococcus pyogenes NOT DETECTED NOT DETECTED Final   Acinetobacter baumannii NOT DETECTED NOT DETECTED Final   Enterobacteriaceae species NOT DETECTED NOT DETECTED Final   Enterobacter cloacae complex NOT DETECTED NOT DETECTED Final   Escherichia coli NOT DETECTED NOT DETECTED Final   Klebsiella oxytoca NOT DETECTED NOT DETECTED Final   Klebsiella pneumoniae NOT DETECTED NOT DETECTED Final   Proteus species NOT DETECTED NOT DETECTED Final   Serratia marcescens NOT DETECTED NOT DETECTED Final   Haemophilus influenzae NOT DETECTED NOT DETECTED Final   Neisseria meningitidis NOT DETECTED NOT DETECTED Final   Pseudomonas aeruginosa NOT DETECTED NOT DETECTED Final   Candida albicans NOT DETECTED NOT DETECTED Final   Candida glabrata NOT DETECTED NOT DETECTED Final   Candida krusei NOT DETECTED NOT DETECTED Final   Candida parapsilosis NOT DETECTED NOT DETECTED Final   Candida tropicalis  NOT DETECTED NOT DETECTED Final    Comment: Performed at [facility], [address] [place], [place] [address]  Blood Culture (routine x 2)     Status: Abnormal   Collection Time: 12/28/17 11:18 PM  Result Value Ref Range Status   Specimen Description BLOOD RIGHT HAND  Final   Special Requests   Final    BOTTLES DRAWN AEROBIC AND ANAEROBIC Blood Culture results may not be optimal due to an inadequate volume of blood received in culture bottles   Culture  Setup Time   Final    GRAM POSITIVE COCCI IN CLUSTERS IN BOTH AEROBIC AND ANAEROBIC BOTTLES CRITICAL RESULT CALLED TO, READ BACK BY AND VERIFIED WITH: Ferne Coe PharmD 10:45 12/29/17 ([email or web address])    Culture (A)  Final    STAPHYLOCOCCUS AUREUS SUSCEPTIBILITIES PERFORMED ON PREVIOUS CULTURE WITHIN THE LAST 5 DAYS. Performed at [facility], [place] [address] [place], [place] [address]    Report Status 12/31/2017 FINAL  Final  Urine culture     Status: Abnormal   Collection Time: 12/29/17 12:03 AM  Result Value Ref Range Status   Specimen Description URINE, RANDOM  Final   Special Requests   Final    Immunocompromised Performed at [facility], [place] [address] [place], [place] [address]    Culture (A)  Final    20,000 COLONIES/mL ENTEROCOCCUS FAECALIS 10,000 COLONIES/mL STAPHYLOCOCCUS AUREUS METHICILLIN RESISTANT STAPHYLOCOCCUS AUREUS    Report Status 01/01/2018 FINAL  Final   Organism ID, Bacteria ENTEROCOCCUS FAECALIS (A)  Final   Organism ID, Bacteria STAPHYLOCOCCUS AUREUS (A)  Final      Susceptibility   Enterococcus faecalis - MIC*    AMPICILLIN <=2 SENSITIVE Sensitive     LEVOFLOXACIN 1 SENSITIVE Sensitive     NITROFURANTOIN <=16 SENSITIVE Sensitive     VANCOMYCIN 2 SENSITIVE Sensitive     * 20,000 COLONIES/mL ENTEROCOCCUS FAECALIS   Staphylococcus aureus - MIC*    CIPROFLOXACIN <=0.5 SENSITIVE Sensitive     GENTAMICIN <=0.5 SENSITIVE Sensitive     NITROFURANTOIN <=16 SENSITIVE Sensitive     OXACILLIN  >=4 RESISTANT Resistant     TETRACYCLINE <=1 SENSITIVE Sensitive     VANCOMYCIN 1 SENSITIVE Sensitive     TRIMETH/SULFA <=10 SENSITIVE Sensitive     CLINDAMYCIN >=8 RESISTANT Resistant     RIFAMPIN <=0.5 SENSITIVE Sensitive     Inducible Clindamycin NEGATIVE Sensitive     * 10,000 COLONIES/mL STAPHYLOCOCCUS AUREUS  Culture, blood (routine x 2)     Status: None (Preliminary result)   Collection Time: 12/30/17  8:12 AM  Result Value Ref Range Status   Specimen Description BLOOD LEFT ANTECUBITAL  Final   Special Requests   Final    BOTTLES DRAWN AEROBIC ONLY Blood Culture adequate volume   Culture   Final    NO GROWTH 1 DAY Performed at [facility], [address] [place], [place] [address]    Report Status PENDING  Incomplete  Culture, blood (routine x 2)     Status: None (Preliminary result)   Collection Time: 12/30/17  8:15 AM  Result Value Ref Range Status   Specimen Description BLOOD BLOOD LEFT HAND  Final   Special Requests   Final    BOTTLES DRAWN AEROBIC ONLY Blood Culture adequate volume   Culture   Final    NO GROWTH 1 DAY Performed at [facility], [place] [address], [place], [place] [address]    Report Status PENDING  Incomplete         Radiology Studies: Dg  Chest Port 1 View  Result Date: 01/01/2018 CLINICAL DATA:  Confusion. EXAM: PORTABLE CHEST 1 VIEW COMPARISON:  12/28/2017 FINDINGS: Prior median sternotomy with aortic valve replacement and left atrial appendage clamp placement. Enlarged cardiac silhouette. Mediastinal contours appear intact for portable technique. Calcific atherosclerotic disease of the aorta. There is no evidence of pneumothorax. Low lung volumes with exaggeration of the interstitial markings. Osseous structures are without acute abnormality. Soft tissues are grossly normal. IMPRESSION: Enlarged cardiac silhouette. Calcific atherosclerotic disease of the aorta. Low lung volumes with secondary exaggeration of the interstitial markings.  Electronically Signed   By: Fidela Salisbury M.D.   On: 01/01/2018 10:23        Scheduled Meds: . aspirin  300 mg Rectal Daily  . folic acid  1 mg Oral Daily  . insulin aspart  0-9 Units Subcutaneous Q4H  . metoprolol tartrate  5 mg Intravenous Q8H  . multivitamin with minerals  1 tablet Oral Daily  . thiamine injection  100 mg Intravenous Daily   Continuous Infusions: . dextrose 100 mL/hr at 01/01/18 0821  . rifampin (RIFADIN) IVPB 300 mg (12/31/17 2210)  . vancomycin 1,250 mg (12/31/17 1728)     LOS: 3 days     Vernell Leep, MD, FACP, [facility]. [facility] Pager [phone]  If 7PM-7AM, please contact night-coverage [email or web address] Password [email or web address] 01/01/2018, 11:06 AM

## 2018-01-01 NOTE — Progress Notes (Signed)
[facility]   Reason for visit: Follow up on bacteremia  Interval History: no acute changes.  Tmax 100.4 last 24 hours, WBC up slightly to 13.6 from 11.1.      Vancomycin day 5 Rifampin day 3 Previous piptazo and cefepime  Physical Exam: Constitutional:  Vitals:   01/01/18 0932 01/01/18 1132  BP: (!) 165/96 (!) 159/103  Pulse:    Resp: (!) 28   Temp:  (!) 100.4 F (38 C)  SpO2: 94%    patient appears in NAD, more arousable, interactive Eyes: anicteric Respiratory: Normal respiratory effort; CTA B Cardiovascular: RRR GI: soft, nt, nd  Review of Systems: Unable to be assessed otherwise due to patient factors  Lab Results  Component Value Date   WBC 13.6 (H) 01/01/2018   HGB 8.8 (L) 01/01/2018   HCT 29.7 (L) 01/01/2018   MCV 88.4 01/01/2018   PLT 94 (L) 01/01/2018    Lab Results  Component Value Date   CREATININE 1.21 01/01/2018   BUN 38 (H) 01/01/2018   NA 147 (H) 01/01/2018   K 3.8 01/01/2018   CL 122 (H) 01/01/2018   CO2 18 (L) 01/01/2018    Lab Results  Component Value Date   ALT 23 12/31/2017   AST 45 (H) 12/31/2017   ALKPHOS 71 12/31/2017     Microbiology: Recent Results (from the past 240 hour(s))  Blood Culture (routine x 2)     Status: Abnormal   Collection Time: 12/28/17 10:32 PM  Result Value Ref Range Status   Specimen Description BLOOD RIGHT ANTECUBITAL  Final   Special Requests   Final    BOTTLES DRAWN AEROBIC AND ANAEROBIC Blood Culture results may not be optimal due to an excessive volume of blood received in culture bottles   Culture  Setup Time   Final    GRAM POSITIVE COCCI IN BOTH AEROBIC AND ANAEROBIC BOTTLES CRITICAL RESULT CALLED TO, READ BACK BY AND VERIFIED WITH: Antoine Primas PharmD 10:45 12/29/17 ([email or web address]) Performed at [facility], [address], [place], [place] [address]    Culture METHICILLIN RESISTANT STAPHYLOCOCCUS AUREUS (A)  Final   Report Status 12/31/2017 FINAL  Final   Organism  ID, Bacteria METHICILLIN RESISTANT STAPHYLOCOCCUS AUREUS  Final      Susceptibility   Methicillin resistant staphylococcus aureus - MIC*    CIPROFLOXACIN <=0.5 SENSITIVE Sensitive     ERYTHROMYCIN >=8 RESISTANT Resistant     GENTAMICIN <=0.5 SENSITIVE Sensitive     OXACILLIN RESISTANT Resistant     TETRACYCLINE <=1 SENSITIVE Sensitive     VANCOMYCIN <=0.5 SENSITIVE Sensitive     TRIMETH/SULFA <=10 SENSITIVE Sensitive     CLINDAMYCIN >=8 RESISTANT Resistant     RIFAMPIN <=0.5 SENSITIVE Sensitive     Inducible Clindamycin NEGATIVE Sensitive     * METHICILLIN RESISTANT STAPHYLOCOCCUS AUREUS  Blood Culture ID Panel (Reflexed)     Status: Abnormal   Collection Time: 12/28/17 10:32 PM  Result Value Ref Range Status   Enterococcus species NOT DETECTED NOT DETECTED Final   Listeria monocytogenes NOT DETECTED NOT DETECTED Final   Staphylococcus species DETECTED (A) NOT DETECTED Final    Comment: CRITICAL RESULT CALLED TO, READ BACK BY AND VERIFIED WITH: Antoine Primas PharmD 10:45 12/29/17 ([email or web address])    Staphylococcus aureus (BCID) DETECTED (A) NOT DETECTED Final    Comment: Methicillin (oxacillin)-resistant Staphylococcus aureus (MRSA). MRSA is predictably resistant to beta-lactam antibiotics (except ceftaroline). Preferred therapy is vancomycin unless clinically contraindicated. Patient requires  contact precautions if  hospitalized. CRITICAL RESULT CALLED TO, READ BACK BY AND VERIFIED WITH: Antoine Primas PharmD 10:45 12/29/17 ([email or web address])    Methicillin resistance DETECTED (A) NOT DETECTED Final    Comment: CRITICAL RESULT CALLED TO, READ BACK BY AND VERIFIED WITH: Antoine Primas PharmD 10:45 12/29/17 ([email or web address])    Streptococcus species NOT DETECTED NOT DETECTED Final   Streptococcus agalactiae NOT DETECTED NOT DETECTED Final   Streptococcus pneumoniae NOT DETECTED NOT DETECTED Final   Streptococcus pyogenes NOT DETECTED NOT DETECTED Final   Acinetobacter baumannii NOT DETECTED NOT DETECTED Final    Enterobacteriaceae species NOT DETECTED NOT DETECTED Final   Enterobacter cloacae complex NOT DETECTED NOT DETECTED Final   Escherichia coli NOT DETECTED NOT DETECTED Final   Klebsiella oxytoca NOT DETECTED NOT DETECTED Final   Klebsiella pneumoniae NOT DETECTED NOT DETECTED Final   Proteus species NOT DETECTED NOT DETECTED Final   Serratia marcescens NOT DETECTED NOT DETECTED Final   Haemophilus influenzae NOT DETECTED NOT DETECTED Final   Neisseria meningitidis NOT DETECTED NOT DETECTED Final   Pseudomonas aeruginosa NOT DETECTED NOT DETECTED Final   Candida albicans NOT DETECTED NOT DETECTED Final   Candida glabrata NOT DETECTED NOT DETECTED Final   Candida krusei NOT DETECTED NOT DETECTED Final   Candida parapsilosis NOT DETECTED NOT DETECTED Final   Candida tropicalis NOT DETECTED NOT DETECTED Final    Comment: Performed at [facility], [address], [place], [place] [address]  Blood Culture (routine x 2)     Status: Abnormal   Collection Time: 12/28/17 11:18 PM  Result Value Ref Range Status   Specimen Description BLOOD RIGHT HAND  Final   Special Requests   Final    BOTTLES DRAWN AEROBIC AND ANAEROBIC Blood Culture results may not be optimal due to an inadequate volume of blood received in culture bottles   Culture  Setup Time   Final    GRAM POSITIVE COCCI IN CLUSTERS IN BOTH AEROBIC AND ANAEROBIC BOTTLES CRITICAL RESULT CALLED TO, READ BACK BY AND VERIFIED WITH: Antoine Primas PharmD 10:45 12/29/17 ([email or web address])    Culture (A)  Final    STAPHYLOCOCCUS AUREUS SUSCEPTIBILITIES PERFORMED ON PREVIOUS CULTURE WITHIN THE LAST 5 DAYS. Performed at [facility], [address], [place], [place] [address]    Report Status 12/31/2017 FINAL  Final  Urine culture     Status: Abnormal   Collection Time: 12/29/17 12:03 AM  Result Value Ref Range Status   Specimen Description URINE, RANDOM  Final   Special Requests   Final    Immunocompromised Performed at [facility]  [facility], [address], [place], [place] [address]    Culture (A)  Final    20,000 COLONIES/mL ENTEROCOCCUS FAECALIS 10,000 COLONIES/mL STAPHYLOCOCCUS AUREUS METHICILLIN RESISTANT STAPHYLOCOCCUS AUREUS    Report Status 01/01/2018 FINAL  Final   Organism ID, Bacteria ENTEROCOCCUS FAECALIS (A)  Final   Organism ID, Bacteria STAPHYLOCOCCUS AUREUS (A)  Final      Susceptibility   Enterococcus faecalis - MIC*    AMPICILLIN <=2 SENSITIVE Sensitive     LEVOFLOXACIN 1 SENSITIVE Sensitive     NITROFURANTOIN <=16 SENSITIVE Sensitive     VANCOMYCIN 2 SENSITIVE Sensitive     * 20,000 COLONIES/mL ENTEROCOCCUS FAECALIS   Staphylococcus aureus - MIC*    CIPROFLOXACIN <=0.5 SENSITIVE Sensitive     GENTAMICIN <=0.5 SENSITIVE Sensitive     NITROFURANTOIN <=16 SENSITIVE Sensitive     OXACILLIN >=4 RESISTANT Resistant  TETRACYCLINE <=1 SENSITIVE Sensitive     VANCOMYCIN 1 SENSITIVE Sensitive     TRIMETH/SULFA <=10 SENSITIVE Sensitive     CLINDAMYCIN >=8 RESISTANT Resistant     RIFAMPIN <=0.5 SENSITIVE Sensitive     Inducible Clindamycin NEGATIVE Sensitive     * 10,000 COLONIES/mL STAPHYLOCOCCUS AUREUS  Culture, blood (routine x 2)     Status: None (Preliminary result)   Collection Time: 12/30/17  8:12 AM  Result Value Ref Range Status   Specimen Description BLOOD LEFT ANTECUBITAL  Final   Special Requests   Final    BOTTLES DRAWN AEROBIC ONLY Blood Culture adequate volume   Culture   Final    NO GROWTH 1 DAY Performed at [facility], [address], [place], [place] [address]    Report Status PENDING  Incomplete  Culture, blood (routine x 2)     Status: None (Preliminary result)   Collection Time: 12/30/17  8:15 AM  Result Value Ref Range Status   Specimen Description BLOOD BLOOD LEFT HAND  Final   Special Requests   Final    BOTTLES DRAWN AEROBIC ONLY Blood Culture adequate volume   Culture   Final    NO GROWTH 1 DAY Performed at [facility], [address], [place], [place]  [address]    Report Status PENDING  Incomplete    Impression/Plan:  1. MRSA bacteremia - on vancomycin and rifampin due to prosthetic valve.  Repeat blood cultures have been sent and ngtd.    2. Bioprosthetic valve - with #1, high concern for endocarditis and will need TEE which is tentatively planned for this coming week.   3.  Fever - curve slightly improved.    4. AMS - some slow improvement.    Dr. Drue Second back tomorrow

## 2018-01-02 ENCOUNTER — Inpatient Hospital Stay (HOSPITAL_COMMUNITY): Payer: Medicare Other

## 2018-01-02 DIAGNOSIS — M79605 Pain in left leg: Secondary | ICD-10-CM

## 2018-01-02 DIAGNOSIS — R1084 Generalized abdominal pain: Secondary | ICD-10-CM

## 2018-01-02 DIAGNOSIS — M79604 Pain in right leg: Secondary | ICD-10-CM

## 2018-01-02 DIAGNOSIS — D696 Thrombocytopenia, unspecified: Secondary | ICD-10-CM

## 2018-01-02 LAB — COMPREHENSIVE METABOLIC PANEL
ALBUMIN: 2 g/dL — AB (ref 3.5–5.0)
ALK PHOS: 70 U/L (ref 38–126)
ALT: 18 U/L (ref 0–44)
ANION GAP: 6 (ref 5–15)
AST: 20 U/L (ref 15–41)
BUN: 26 mg/dL — ABNORMAL HIGH (ref 8–23)
CALCIUM: 7.8 mg/dL — AB (ref 8.9–10.3)
CHLORIDE: 120 mmol/L — AB (ref 98–111)
CO2: 19 mmol/L — AB (ref 22–32)
Creatinine, Ser: 1.06 mg/dL (ref 0.61–1.24)
GFR calc Af Amer: >60 mL/min (ref >60)
GFR calc non Af Amer: >60 mL/min (ref >60)
GLUCOSE: 195 mg/dL — AB (ref 70–99)
Potassium: 3.2 mmol/L — ABNORMAL LOW (ref 3.5–5.1)
SODIUM: 145 mmol/L (ref 135–145)
Total Bilirubin: 2.8 mg/dL — ABNORMAL HIGH (ref 0.3–1.2)
Total Protein: 5.3 g/dL — ABNORMAL LOW (ref 6.5–8.1)

## 2018-01-02 LAB — CBC
HCT: 29.8 % — ABNORMAL LOW (ref 39.0–52.0)
HEMOGLOBIN: 8.9 g/dL — AB (ref 13.0–17.0)
MCH: 26.4 pg (ref 26.0–34.0)
MCHC: 29.9 g/dL — ABNORMAL LOW (ref 30.0–36.0)
MCV: 88.4 fL (ref 80.0–100.0)
PLATELETS: 89 10*3/uL — AB (ref 150–400)
RBC: 3.37 MIL/uL — AB (ref 4.22–5.81)
RDW: 17.3 % — ABNORMAL HIGH (ref 11.5–15.5)
WBC: 12.5 10*3/uL — AB (ref 4.0–10.5)
nRBC: 0 % (ref 0.0–0.2)

## 2018-01-02 LAB — BASIC METABOLIC PANEL
Anion gap: 1 — ABNORMAL LOW (ref 5–15)
BUN: 32 mg/dL — AB (ref 8–23)
CALCIUM: 7.8 mg/dL — AB (ref 8.9–10.3)
CO2: 20 mmol/L — ABNORMAL LOW (ref 22–32)
CREATININE: 1.14 mg/dL (ref 0.61–1.24)
Chloride: 123 mmol/L — ABNORMAL HIGH (ref 98–111)
Glucose, Bld: 220 mg/dL — ABNORMAL HIGH (ref 70–99)
Potassium: 2.9 mmol/L — ABNORMAL LOW (ref 3.5–5.1)
SODIUM: 144 mmol/L (ref 135–145)

## 2018-01-02 LAB — GLUCOSE, CAPILLARY
GLUCOSE-CAPILLARY: 153 mg/dL — AB (ref 70–99)
GLUCOSE-CAPILLARY: 188 mg/dL — AB (ref 70–99)
GLUCOSE-CAPILLARY: 196 mg/dL — AB (ref 70–99)
Glucose-Capillary: 148 mg/dL — ABNORMAL HIGH (ref 70–99)
Glucose-Capillary: 139 mg/dL — ABNORMAL HIGH (ref 70–99)

## 2018-01-02 LAB — AMMONIA: Ammonia: 18 umol/L (ref 9–35)

## 2018-01-02 MED ORDER — METOPROLOL TARTRATE 25 MG PO TABS
37.5000 mg | ORAL_TABLET | Freq: Two times a day (BID) | ORAL | Status: DC
  Administered 2018-01-03 – 2018-01-05 (×5): 37.5 mg via ORAL
  Filled 2018-01-02 (×8): qty 1

## 2018-01-02 MED ORDER — POTASSIUM CHLORIDE 2 MEQ/ML IV SOLN
INTRAVENOUS | Status: DC
  Filled 2018-01-02: qty 1000

## 2018-01-02 MED ORDER — INSULIN ASPART 100 UNIT/ML ~~LOC~~ SOLN: 0.0000 [IU] | Freq: Every day | SUBCUTANEOUS | Status: DC

## 2018-01-02 MED ORDER — POTASSIUM CHLORIDE 10 MEQ/100ML IV SOLN
10.0000 meq | INTRAVENOUS | Status: AC
  Administered 2018-01-02 (×3): 10 meq via INTRAVENOUS
  Filled 2018-01-02 (×3): qty 100

## 2018-01-02 MED ORDER — POTASSIUM CHLORIDE 2 MEQ/ML IV SOLN
INTRAVENOUS | Status: DC
  Administered 2018-01-02 (×2): via INTRAVENOUS
  Filled 2018-01-02 (×2): qty 1000

## 2018-01-02 MED ORDER — INSULIN ASPART 100 UNIT/ML ~~LOC~~ SOLN
0.0000 [IU] | Freq: Three times a day (TID) | SUBCUTANEOUS | Status: DC
  Administered 2018-01-02: 2 [IU] via SUBCUTANEOUS
  Administered 2018-01-02: 1 [IU] via SUBCUTANEOUS
  Administered 2018-01-03: 3 [IU] via SUBCUTANEOUS
  Administered 2018-01-03: 2 [IU] via SUBCUTANEOUS
  Administered 2018-01-03: 1 [IU] via SUBCUTANEOUS
  Administered 2018-01-04: 2 [IU] via SUBCUTANEOUS
  Administered 2018-01-05: 1 [IU] via SUBCUTANEOUS
  Administered 2018-01-05: 2 [IU] via SUBCUTANEOUS

## 2018-01-02 MED ORDER — SIMVASTATIN 20 MG PO TABS
20.0000 mg | ORAL_TABLET | Freq: Every day | ORAL | Status: DC
  Administered 2018-01-03 – 2018-01-04 (×2): 20 mg via ORAL
  Filled 2018-01-02 (×3): qty 1

## 2018-01-02 MED ORDER — VITAMIN B-12 1000 MCG PO TABS
1000.0000 ug | ORAL_TABLET | Freq: Every day | ORAL | Status: DC
  Administered 2018-01-03 – 2018-01-05 (×3): 1000 ug via ORAL
  Filled 2018-01-02 (×4): qty 1

## 2018-01-02 MED ORDER — TAMSULOSIN HCL 0.4 MG PO CAPS
0.4000 mg | ORAL_CAPSULE | Freq: Every day | ORAL | Status: DC
  Administered 2018-01-03 – 2018-01-05 (×3): 0.4 mg via ORAL
  Filled 2018-01-02 (×5): qty 1

## 2018-01-02 NOTE — Evaluation (Signed)
Physical Therapy Evaluation Patient Details Name: Peter Weiss MRN: [id number] DOB: Apr 25, 1940 Today's Date: 01/02/2018   History of Present Illness  77yo male presenting with AMS, uanble to provide history which was taken from EMS who found him in bed in an extended stay hotel covered in urine. Diagnosed with acute cholecystitis, sepsis. PMH aortic stenosis, CHF, DM, syncoe, neuromuscular disorder, AVR, s/p Maze procedure   Clinical Impression   Patient received in bed, lethargic but easily woken and able to participate in basic conversation/interactions with PT however frequently demonstrates inappropriate interactions (IE, states "call me asshole" when asked to recite his name, etc). Able to intermittently and inconsistently follow simple cues from PT, however requires maxA to consistently perform tasks such as moving UEs/LEs in bed. Attempted rolling but patient required totalA and with very little effort or initiation likely due to lethargy today. Unable/unsafe to attempt to progress mobility with assist of just +1. He was left in bed with all needs met, bed alarm activated this afternoon. He may benefit from a trial of skilled PT services as well as skilled services in the ST-SNF setting moving forward.     Follow Up Recommendations SNF    Equipment Recommendations  Other (comment)(defer to next venue )    Recommendations for Other Services       Precautions / Restrictions Precautions Precautions: Fall Restrictions Weight Bearing Restrictions: No      Mobility  Bed Mobility Overal bed mobility: Needs Assistance Bed Mobility: Rolling Rolling: Total assist         General bed mobility comments: totalA to even attempt rolling, very little initiation noted from patient   Transfers                 General transfer comment: unable/unsafe to attempt with assist of +1   Ambulation/Gait             General Gait Details: uanble/unsafe to attempt with  assist of just +1   Stairs            Wheelchair Mobility    Modified Rankin (Stroke Patients Only)       Balance Overall balance assessment: Needs assistance   Sitting balance-Leahy Scale: Zero       Standing balance-Leahy Scale: Zero                               Pertinent Vitals/Pain Pain Assessment: Faces Faces Pain Scale: No hurt Pain Intervention(s): Limited activity within patient's tolerance;Monitored during session    [place] expects to be discharged to:: Private residence Living Arrangements: Non-relatives/Friends Available Help at Discharge: Family;Available PRN/intermittently Type of Home: Mobile home Home Access: Stairs to enter Entrance Stairs-Rails: None Entrance Stairs-Number of Steps: 3 Home Layout: One level Home Equipment: Cane - single point Additional Comments: patient unable to provide history, all information taken from prior charting     Prior Function Level of Independence: Independent with assistive device(s)               Hand Dominance        Extremity/Trunk Assessment   Upper Extremity Assessment Upper Extremity Assessment: Defer to OT evaluation    Lower Extremity Assessment Lower Extremity Assessment: Generalized weakness    Cervical / Trunk Assessment Cervical / Trunk Assessment: Normal  Communication   Communication: No difficulties  Cognition Arousal/Alertness: Lethargic Behavior During Therapy: Flat affect Overall Cognitive Status: Impaired/Different from baseline Area of Impairment: Orientation;Attention;Memory;Following  commands;Safety/judgement;Awareness;Problem solving                 Orientation Level: Disoriented to;Place;Time;Situation Current Attention Level: Focused Memory: Decreased short-term memory;Decreased recall of precautions Following Commands: Follows one step commands inconsistently Safety/Judgement: Decreased awareness of safety;Decreased  awareness of deficits Awareness: Intellectual Problem Solving: Slow processing;Decreased initiation;Difficulty sequencing;Requires verbal cues;Requires tactile cues        General Comments      Exercises     Assessment/Plan    PT Assessment Patient needs continued PT services  PT Problem List Decreased strength;Decreased knowledge of use of DME;Obesity;Decreased activity tolerance;Decreased safety awareness;Decreased balance;Decreased knowledge of precautions;Decreased mobility;Decreased coordination;Cardiopulmonary status limiting activity       PT Treatment Interventions DME instruction;Balance training;Gait training;Neuromuscular re-education;Stair training;Functional mobility training;Patient/family education;Therapeutic activities;Therapeutic exercise;Manual techniques    PT Goals (Current goals can be found in the Care Plan section)  Acute Rehab PT Goals PT Goal Formulation: Patient unable to participate in goal setting    Frequency Min 2X/week   Barriers to discharge        Co-evaluation               AM-PAC PT "6 Clicks" Daily Activity  Outcome Measure Difficulty turning over in bed (including adjusting bedclothes, sheets and blankets)?: Unable Difficulty moving from lying on back to sitting on the side of the bed? : Unable Difficulty sitting down on and standing up from a chair with arms (e.g., wheelchair, bedside commode, etc,.)?: Unable Help needed moving to and from a bed to chair (including a wheelchair)?: Total Help needed walking in hospital room?: Total Help needed climbing 3-5 steps with a railing? : Total 6 Click Score: 6    End of Session Equipment Utilized During Treatment: Oxygen Activity Tolerance: Patient limited by lethargy Patient left: in bed;with call bell/phone within reach;with bed alarm set   PT Visit Diagnosis: Muscle weakness (generalized) (M62.81);Difficulty in walking, not elsewhere classified (R26.2)    Time: 1300-1310 PT  Time Calculation (min) (ACUTE ONLY): 10 min   Charges:   PT Evaluation $PT Eval Moderate Complexity: 1 Mod          Deniece Ree PT, DPT, CBIS  Supplemental Physical Therapist [place]    Pager [phone] Acute Rehab Office [phone]

## 2018-01-02 NOTE — Progress Notes (Signed)
PT Cancellation Note  Patient Details Name: Peter Weiss MRN: [id number] DOB: August 30, 1940   Cancelled Treatment:    Reason Eval/Treat Not Completed: Other (comment) Unable to work with patient at this time as he was occupied with other skilled services. Will attempt to return as/if time/schedule allow.    Nedra Hai PT, DPT, CBIS  Supplemental Physical Therapist [facility]    Pager [phone] Acute Rehab Office [phone]

## 2018-01-02 NOTE — Progress Notes (Addendum)
PROGRESS NOTE   Peter Weiss  NUU:[id number]    DOB: 01/09/1941    DOA: 12/28/2017  PCP: Nolene Ebbs, MD   I have briefly reviewed patients previous medical records in [facility].  Brief Narrative:  77 year old male reportedly lives at an extended stay hotel off of [facility] reportedly independent with his ADLs at baseline (normally alert and oriented and walks his dog daily), PMH of aortic valve stenosis, status post AVR (bovine valve), A. fib not on anticoagulation, chronic diastolic CHF, DM 2, dementia, lumbar DDD, AAA, HTN, HLD, depression, BPH, GI bleeding, alcohol use, EMS was called out by security who had to use forced entry to enter patient's room where he was found in bed covered in urine and brought to ED for altered mental status.  He was unable to provide any history in the ED.  He was admitted for MRSA bacteremia/sepsis, suspected acute cholecystitis and acute encephalopathy.  Cardiology consulted due to elevated troponins.  General surgery/IR consulted for acute cholecystitis.  ID consulted for MRSA bacteremia.  Mental status improving.  TEE likely on 01/04/2018.  Assessment & Plan:   Principal Problem:   MRSA bacteremia Active Problems:   Type II diabetes mellitus with renal manifestations (HCC)   Aortic stenosis   Atrial fibrillation, chronic   Essential hypertension   Dementia (HCC)   Normocytic anemia   Sepsis ([place])   Acute cholecystitis   CKD (chronic kidney disease), stage III (HCC)   Chronic diastolic CHF (congestive heart failure) (HCC)   BPH (benign prostatic hyperplasia)   Acute metabolic encephalopathy   Hypokalemia   Elevated troponin   Sepsis due to MRSA bacteremia Met sepsis criteria on admission.  Blood cultures x2 and BCID show MRSA.  Treated per sepsis protocol with IV fluids and initiated on IV vancomycin and Zosyn.  ID consultation and follow-up appreciated.  Source of infection unclear at this time.  TTE was limited  study and showed no vegetations.  Zosyn discontinued.  Continue vancomycin and ID added rifampicin until endocarditis ruled out.  Cardiology consulted for TEE, likely for 01/04/2018.  Improving.  T-max 101.2 in the last 24 hours, mild leukocytosis which is fluctuating.  Continue current management.  Surveillance blood cultures from 10/18: Negative to date.  Urine culture 10/17 shows insignificant growth (20 K Enterococcus faecalis and 10 K MRSA).  Not sure why he is still intermittently spiking fevers despite appropriate antibiotics.  Cholecystitis ruled out CT abdomen and pelvis with contrast 10/17: Gallbladder wall thickening and edema with surrounding infiltration suggesting acute cholecystitis.  General surgery was consulted.  HIDA scan was normal.  Cholecystitis ruled out.  Acute toxic metabolic encephalopathy Exact etiology not clear but possibly related to sepsis.  No acute intracranial abnormality noted.  No focal deficits.  Delirium precautions.  Alcohol use listed but amounts not known.  UDS negative.  Ammonia levels normal.  Avoid QT prolonging medications due to prolonged QTC. CIWA protocol.   No significant change in mental status compared to yesterday.  Has not received any Ativan since 10/17.  No agitation and has not required sitter for 2 days.  No CO2 retention on VBG.  EEG done and pending.  Mild hypernatremia probably not cause for all his mental status changes.  Mental status has significantly improved over the last 24 hours.  Continue to monitor.  Repeat ammonia normal/18.  A. fib with mild RVR CHA2DS2-VASc Score is 5.  Patient reportedly not anticoagulation candidate due to history of GI  bleed.  Monitor on telemetry.  Controlled ventricular rate in the 100s.  Now that patient's mental status is improved and diet initiated per speech therapy, changed IV metoprolol to home dose of metoprolol 37.5 mg twice daily.  Elevated troponin: No report of chest pain.  Mild increased from  0.16-0.26 but plateaued thereafter.  Could be demand ischemia from sepsis.  Cardiology consulted but likely supportive care.  Continue rectal aspirin.  TTE shows normal EF.  Requested Cardiology for TEE to rule out endocarditis.  Aortic stenosis status post AVR Limited TTE without obvious vegetations.  Cardiology consulted for TEE.  DM 2 with renal manifestations A1c 7.1.  CBGs mildly uncontrolled in the low 200s, likely due to D5 infusion.  Continue SSI.  Mildly uncontrolled.  No change in treatment.  Essential hypertension Uncontrolled.  Resumed oral metoprolol 37.5 mg twice daily.  PRN IV hydralazine.  Monitor and adjust as needed.  Dementia Continue to hold Aricept until mental status further improves.  Normocytic anemia Hemoglobin has dropped from 9-7.8.  No overt bleeding.  Could be dilutional versus critical illness.  Transfuse if hemoglobin 7 or less.  Hemoglobin stable in the mid 8 g range..  Thrombocytopenia Could be related to sepsis.  No bleeding reported.  Hemoglobin has dropped further from 95-78.  Platelets have slightly improved to the 90s.  Acute on stage III chronic kidney disease Baseline creatinine not clear but may be in the 1-1.3 range.  Creatinine peaked to 1.51 but has normalized.  Acute kidney injury resolved.  Chronic diastolic CHF: TTE 3/76/2831 showed LVEF 65-70%.  Does not appear volume overloaded either clinically or radiologically.  Holding Lasix.  Chest x-ray 10/19 personally reviewed, poor inspiratory effort but no overt pulmonary edema.  BPH Resume Flomax.  Foley catheter placed in ED, indication not clear.  Foley catheter discontinued 10/19 and voiding without difficulty.  Hypokalemia Replace and follow as needed.  Hypomagnesemia Magnesium 1.2 on admission.  Replaced.  Metabolic acidosis/elevated lactate Peak lactate 3.12.  Lactate has normalized.  Bicarbonate down to 13, anion gap 10 on 10/19.  Likely due to hyperchloremia.  Normal saline was  discontinued and D5 infusion was started 10/19.  Hypernatremia has improved/normalized, hyperchloremia and acidosis improving.  Reduced IV D5 infusion.  Prolonged QTC Replace hypokalemia and hypomagnesemia.  Monitor on telemetry.  Avoid QT prolonging medications.  EKG 12/29/2017: RBBB, QTC 533 (not sure if accurate due to BBB).  Stable mass within the pituitary fossa:  Noted on CT head 10/17.  Mild mass-effect on the optic chiasm and prechiasmatic optic nerves.  Unclear etiology.  Outpatient follow-up.  Obesity/Body mass index is 33.45 kg/m.   Hypernatremia Possibly from NS hydration.  Discontinued normal saline and initiated D5 infusion on 10/19.  Sodium down to 145.  Dysphagia As per speech therapy evaluation 10/21, dysphagia 1 diet and thin liquids.  B/L Leg mottling, R>L D/w Dr. Baxter Flattery. Vascular consulted and seen with VVS in room. Palpable left DP and right PT and b/l Femorals. Right leg cooler to touch than Left. Await VVS recommendations   DVT prophylaxis: SCDs Code Status: Full Family Communication: Unable to reach anyone on patient's behalf. Disposition: To be determined further clinical improvement, evaluation (TEE pending) and PT evaluation.   Consultants:  General surgery Interventional radiology Cardiology Infectious disease  Procedures:  Foley catheter 10/17 > 10/19  Antimicrobials:  IV vancomycin 10/17 > IV rifampin 10/19 > Zosyn discontinued   Subjective: Patient alert and oriented to self and partly to place.  States that  he is in the hospital because he was "confused".  States that he has 2 dogs, a black [place] and a terrier.  Able to engage more in conversation, answers simple questions appropriately and follows instructions.  Denies pain or dyspnea.  Wants to try to eat something- "that would be good".  ROS: Unable.  Objective:  Vitals:   01/02/18 0229 01/02/18 0409 01/02/18 0429 01/02/18 0556  BP: 134/71  (!) 148/97   Pulse:   (!) 111     Resp: (!) 30  (!) 21   Temp:  (!) 101.2 F (38.4 C)  99.4 F (37.4 C)  TempSrc:  Axillary  Axillary  SpO2:   100%   Weight:      Height:        Examination:  General exam: Elderly male, moderately built and obese, lying comfortably propped up in bed without distress.  Looks much improved today than he has done since admission. Respiratory system: Midline sternotomy scar.  Clear to auscultation.  No increased work of breathing.  Stable. Cardiovascular system: S1 & S2 heard, irregularly irregular. No JVD, murmurs, rubs, gallops or clicks.  Trace bilateral ankle edema.  Telemetry personally reviewed: PAF/mild ST with BBB morphology. Gastrointestinal system: Abdomen is nondistended but obese, soft and nontender. No organomegaly or masses felt. Normal bowel sounds heard.  Stable Central nervous system: Mental status as above, improved.  No focal deficits. Extremities: Symmetric 5 x 5 power. Below knee, L leg > R leg mottling. Palpable left DP and right PT and b/l Femorals. Right leg cooler to touch than Left.  Moves all extremities by himself. Skin: Generalized multiple skin ulcers at different stages of healing but none appear acutely infected. Psychiatry: Judgement and insight impaired. Mood & affect flat. Neck: supple.      Data Reviewed: I have personally reviewed following labs and imaging studies  CBC: Recent Labs  Lab 12/28/17 2232  12/29/17 0753 12/30/17 0812 12/31/17 1205 01/01/18 0347 01/02/18 0531  WBC 14.0*   < > 13.7* 8.6 11.1* 13.6* 12.5*  NEUTROABS 13.2*  --   --   --   --   --   --   HGB 10.2*   < > 9.0* 7.8* 8.8* 8.8* 8.9*  HCT 33.0*   < > 30.6* 25.5* 28.8* 29.7* 29.8*  MCV 88.9   < > 90.5 89.5 89.7 88.4 88.4  PLT 166   < > 95* 78* 91* 94* 89*   < > = values in this interval not displayed.   Basic Metabolic Panel: Recent Labs  Lab 12/28/17 2318  12/29/17 0753  12/31/17 0607 12/31/17 1629 01/01/18 0347 01/01/18 1710 01/02/18 0531  NA  --    < > 136    < > 147* 147* 147* 144 145  K  --    < > 3.5   < > 4.4 3.9 3.8 2.9* 3.2*  CL  --    < > 107   < > 124* 123* 122* 123* 120*  CO2  --    < > 17*   < > 13* 17* 18* 20* 19*  GLUCOSE  --    < > 255*   < > 180* 230* 223* 220* 195*  BUN  --    < > 23   < > 39* 40* 38* 32* 26*  CREATININE  --    < > 1.48*   < > 1.39* 1.32* 1.21 1.14 1.06  CALCIUM  --    < > 7.5*   < >  7.7* 7.8* 8.0* 7.8* 7.8*  MG 1.2*  --  1.6*  --  2.2  --   --   --   --    < > = values in this interval not displayed.   Liver Function Tests: Recent Labs  Lab 12/29/17 0254 12/29/17 0753 12/30/17 0812 12/31/17 0607 01/02/18 0531  AST 24 34 26 45* 20  ALT _0 ALKPHOS 33* 35* 34* 71 70  BILITOT 2.0* 1.9* 1.0 2.9* 2.8*  PROT 5.8* 6.1* 5.3* 5.7* 5.3*  ALBUMIN 2.7* 2.7* 2.3* 2.4* 2.0*   Coagulation Profile: Recent Labs  Lab 12/28/17 2318  INR 1.59   Cardiac Enzymes: Recent Labs  Lab 12/28/17 2318 12/29/17 0254 12/29/17 0753 12/29/17 1559  TROPONINI 0.14* 0.26* 0.24* 0.18*   HbA1C: No results for input(s): HGBA1C in the last 72 hours. CBG: Recent Labs  Lab 01/01/18 2037 01/01/18 2352 01/02/18 0356 01/02/18 1057 01/02/18 1225  GLUCAP 190* 187* 148* 153* 139*    Recent Results (from the past 240 hour(s))  Blood Culture (routine x 2)     Status: Abnormal   Collection Time: 12/28/17 10:32 PM  Result Value Ref Range Status   Specimen Description BLOOD RIGHT ANTECUBITAL  Final   Special Requests   Final    BOTTLES DRAWN AEROBIC AND ANAEROBIC Blood Culture results may not be optimal due to an excessive volume of blood received in culture bottles   Culture  Setup Time   Final    GRAM POSITIVE COCCI IN BOTH AEROBIC AND ANAEROBIC BOTTLES CRITICAL RESULT CALLED TO, READ BACK BY AND VERIFIED WITH: Ferne Coe PharmD 10:45 12/29/17 ([email or web address]) Performed at [facility], [place] [address], [place], [place] [address]    Culture METHICILLIN RESISTANT STAPHYLOCOCCUS AUREUS (A)  Final   Report Status  12/31/2017 FINAL  Final   Organism ID, Bacteria METHICILLIN RESISTANT STAPHYLOCOCCUS AUREUS  Final      Susceptibility   Methicillin resistant staphylococcus aureus - MIC*    CIPROFLOXACIN <=0.5 SENSITIVE Sensitive     ERYTHROMYCIN >=8 RESISTANT Resistant     GENTAMICIN <=0.5 SENSITIVE Sensitive     OXACILLIN RESISTANT Resistant     TETRACYCLINE <=1 SENSITIVE Sensitive     VANCOMYCIN <=0.5 SENSITIVE Sensitive     TRIMETH/SULFA <=10 SENSITIVE Sensitive     CLINDAMYCIN >=8 RESISTANT Resistant     RIFAMPIN <=0.5 SENSITIVE Sensitive     Inducible Clindamycin NEGATIVE Sensitive     * METHICILLIN RESISTANT STAPHYLOCOCCUS AUREUS  Blood Culture ID Panel (Reflexed)     Status: Abnormal   Collection Time: 12/28/17 10:32 PM  Result Value Ref Range Status   Enterococcus species NOT DETECTED NOT DETECTED Final   Listeria monocytogenes NOT DETECTED NOT DETECTED Final   Staphylococcus species DETECTED (A) NOT DETECTED Final    Comment: CRITICAL RESULT CALLED TO, READ BACK BY AND VERIFIED WITH: Ferne Coe PharmD 10:45 12/29/17 ([email or web address])    Staphylococcus aureus (BCID) DETECTED (A) NOT DETECTED Final    Comment: Methicillin (oxacillin)-resistant Staphylococcus aureus (MRSA). MRSA is predictably resistant to beta-lactam antibiotics (except ceftaroline). Preferred therapy is vancomycin unless clinically contraindicated. Patient requires contact precautions if  hospitalized. CRITICAL RESULT CALLED TO, READ BACK BY AND VERIFIED WITH: Ferne Coe PharmD 10:45 12/29/17 ([email or web address])    Methicillin resistance DETECTED (A) NOT DETECTED Final    Comment: CRITICAL RESULT CALLED TO, READ BACK BY AND VERIFIED WITH: Ferne Coe PharmD 10:45 12/29/17 ([email or web address])    Streptococcus species NOT DETECTED NOT DETECTED Final  Streptococcus agalactiae NOT DETECTED NOT DETECTED Final   Streptococcus pneumoniae NOT DETECTED NOT DETECTED Final   Streptococcus pyogenes NOT DETECTED NOT DETECTED Final   Acinetobacter baumannii NOT  DETECTED NOT DETECTED Final   Enterobacteriaceae species NOT DETECTED NOT DETECTED Final   Enterobacter cloacae complex NOT DETECTED NOT DETECTED Final   Escherichia coli NOT DETECTED NOT DETECTED Final   Klebsiella oxytoca NOT DETECTED NOT DETECTED Final   Klebsiella pneumoniae NOT DETECTED NOT DETECTED Final   Proteus species NOT DETECTED NOT DETECTED Final   Serratia marcescens NOT DETECTED NOT DETECTED Final   Haemophilus influenzae NOT DETECTED NOT DETECTED Final   Neisseria meningitidis NOT DETECTED NOT DETECTED Final   Pseudomonas aeruginosa NOT DETECTED NOT DETECTED Final   Candida albicans NOT DETECTED NOT DETECTED Final   Candida glabrata NOT DETECTED NOT DETECTED Final   Candida krusei NOT DETECTED NOT DETECTED Final   Candida parapsilosis NOT DETECTED NOT DETECTED Final   Candida tropicalis NOT DETECTED NOT DETECTED Final    Comment: Performed at [facility], [place] [address], [place], [place] [address]  Blood Culture (routine x 2)     Status: Abnormal   Collection Time: 12/28/17 11:18 PM  Result Value Ref Range Status   Specimen Description BLOOD RIGHT HAND  Final   Special Requests   Final    BOTTLES DRAWN AEROBIC AND ANAEROBIC Blood Culture results may not be optimal due to an inadequate volume of blood received in culture bottles   Culture  Setup Time   Final    GRAM POSITIVE COCCI IN CLUSTERS IN BOTH AEROBIC AND ANAEROBIC BOTTLES CRITICAL RESULT CALLED TO, READ BACK BY AND VERIFIED WITH: Ferne Coe PharmD 10:45 12/29/17 ([email or web address])    Culture (A)  Final    STAPHYLOCOCCUS AUREUS SUSCEPTIBILITIES PERFORMED ON PREVIOUS CULTURE WITHIN THE LAST 5 DAYS. Performed at [facility], [place] [address], [place], [place] [address]    Report Status 12/31/2017 FINAL  Final  Urine culture     Status: Abnormal   Collection Time: 12/29/17 12:03 AM  Result Value Ref Range Status   Specimen Description URINE, RANDOM  Final   Special Requests   Final     Immunocompromised Performed at [facility], [place] [address] [place], [place] [address]    Culture (A)  Final    20,000 COLONIES/mL ENTEROCOCCUS FAECALIS 10,000 COLONIES/mL STAPHYLOCOCCUS AUREUS METHICILLIN RESISTANT STAPHYLOCOCCUS AUREUS    Report Status 01/01/2018 FINAL  Final   Organism ID, Bacteria ENTEROCOCCUS FAECALIS (A)  Final   Organism ID, Bacteria STAPHYLOCOCCUS AUREUS (A)  Final      Susceptibility   Enterococcus faecalis - MIC*    AMPICILLIN <=2 SENSITIVE Sensitive     LEVOFLOXACIN 1 SENSITIVE Sensitive     NITROFURANTOIN <=16 SENSITIVE Sensitive     VANCOMYCIN 2 SENSITIVE Sensitive     * 20,000 COLONIES/mL ENTEROCOCCUS FAECALIS   Staphylococcus aureus - MIC*    CIPROFLOXACIN <=0.5 SENSITIVE Sensitive     GENTAMICIN <=0.5 SENSITIVE Sensitive     NITROFURANTOIN <=16 SENSITIVE Sensitive     OXACILLIN >=4 RESISTANT Resistant     TETRACYCLINE <=1 SENSITIVE Sensitive     VANCOMYCIN 1 SENSITIVE Sensitive     TRIMETH/SULFA <=10 SENSITIVE Sensitive     CLINDAMYCIN >=8 RESISTANT Resistant     RIFAMPIN <=0.5 SENSITIVE Sensitive     Inducible Clindamycin NEGATIVE Sensitive     * 10,000 COLONIES/mL STAPHYLOCOCCUS AUREUS  Culture, blood (routine x 2)  Status: None (Preliminary result)   Collection Time: 12/30/17  8:12 AM  Result Value Ref Range Status   Specimen Description BLOOD LEFT ANTECUBITAL  Final   Special Requests   Final    BOTTLES DRAWN AEROBIC ONLY Blood Culture adequate volume   Culture   Final    NO GROWTH 3 DAYS Performed at [place] [address] [place], [place] [address]    Report Status PENDING  Incomplete  Culture, blood (routine x 2)     Status: None (Preliminary result)   Collection Time: 12/30/17  8:15 AM  Result Value Ref Range Status   Specimen Description BLOOD BLOOD LEFT HAND  Final   Special Requests   Final    BOTTLES DRAWN AEROBIC ONLY Blood Culture adequate volume   Culture   Final    NO GROWTH 3 DAYS Performed at  [facility], [place] [address], [place], [place] [address]    Report Status PENDING  Incomplete         Radiology Studies: Dg Chest Port 1 View  Result Date: 01/01/2018 CLINICAL DATA:  Confusion. EXAM: PORTABLE CHEST 1 VIEW COMPARISON:  12/28/2017 FINDINGS: Prior median sternotomy with aortic valve replacement and left atrial appendage clamp placement. Enlarged cardiac silhouette. Mediastinal contours appear intact for portable technique. Calcific atherosclerotic disease of the aorta. There is no evidence of pneumothorax. Low lung volumes with exaggeration of the interstitial markings. Osseous structures are without acute abnormality. Soft tissues are grossly normal. IMPRESSION: Enlarged cardiac silhouette. Calcific atherosclerotic disease of the aorta. Low lung volumes with secondary exaggeration of the interstitial markings. Electronically Signed   By: Fidela Salisbury M.D.   On: 01/01/2018 10:23        Scheduled Meds: . folic acid  1 mg Oral Daily  . insulin aspart  0-5 Units Subcutaneous QHS  . insulin aspart  0-9 Units Subcutaneous TID WC  . metoprolol tartrate  37.5 mg Oral BID  . multivitamin with minerals  1 tablet Oral Daily  . simvastatin  20 mg Oral QHS  . tamsulosin  0.4 mg Oral Daily  . thiamine injection  100 mg Intravenous Daily  . vitamin B-12  1,000 mcg Oral Daily   Continuous Infusions: . dextrose 5 % 1,000 mL with potassium chloride 40 mEq infusion 75 mL/hr at 01/02/18 1038  . potassium chloride 10 mEq (01/02/18 1045)  . rifampin (RIFADIN) IVPB 300 mg (01/02/18 1046)  . vancomycin 1,000 mg (01/02/18 0539)     LOS: 4 days     Vernell Leep, MD, FACP, [facility]. [facility] Pager [phone]  If 7PM-7AM, please contact night-coverage [email or web address] Password [email or web address] 01/02/2018, 1:22 PM

## 2018-01-02 NOTE — Care Management Note (Addendum)
Case Management Note  Patient Details  Name: Peter Weiss MRN: [id number] Date of Birth: 03-Jan-1941  Subjective/Objective:     Presented with AMS/ MRSA bacteremia. Pt was found by security @ extended[facility]stay motel  in bed and covered in urine.   Pt with hx of aortic valve stenosis, s/p AVR , DM-II, dementia, lumbar DDD, AAA, hypertension, hyperlipidemia, depression, BPH, atrial fibrillation not on anticoagulants, GI bleeding. PTA independent with ADL's, no DME usage.   PCP: Dr. Chilton Si  Action/Plan: TTE was negative for vegetations,   TEE, PT evaluation pending.Marland KitchenMarland KitchenNCM following for TOC needs. CSW following for potential SNF need  Expected Discharge Date:                  Expected Discharge Plan:     In-House Referral:  Clinical Social Work  Discharge planning Services  CM Consult  Post Acute Care Choice:    Choice offered to:     DME Arranged:    DME Agency:     HH Arranged:    HH Agency:     Status of Service:  In process, will continue to follow  If discussed at Long Length of Stay Meetings, dates discussed:    Additional Comments:  Epifanio Lesches, RN 01/02/2018, 4:06 PM

## 2018-01-02 NOTE — Progress Notes (Signed)
Order was placed for a STAT CT of the abdomen/pelvis at 1836.  Called CT after patient had not been taken down for approximately one hour after orders were placed, informed that patient needed a #20 in his forearm or higher for CT to be done.  On assessment, it was confirmed that the patient had a #20 in his right [facility], which was documented as placed on 10/17 but the gauge was not documented.  Again called CT at approximately 2136, informed them that the patient had an IV in place, and was told they would be up to get patient shortly.  At approximately 2240 RN Joni Reining called CT again as they still had not gotten the patient.  We were again told that the patient needed a #20 IV, we reminded them that the patient did have one that was placed on the 17th.  At this point we were informed that this IV was too old to be used and that the patient would need a new IV.  Paged IV team for a STAT IV placement.  Will call CT when IV is placed and documented.  Will continue to monitor.

## 2018-01-02 NOTE — Progress Notes (Signed)
Gave pt some Ice Tea off lunch tray he began coughing when tring to swallow it down.  He did a little better with mashed potatoes but was falling asleep with food in his mouth.

## 2018-01-02 NOTE — Progress Notes (Signed)
Patient  alert and oriented x 2 and more talkative and able to say thank you  to this RN after each oral care. Speech still a little slur. Temp=101.2 axillary at this time. Tylenol 650 mg PR administered by [employer].Will recheck and continue to monitor.

## 2018-01-02 NOTE — Consult Note (Addendum)
Hospital Consult    Reason for Consult:  Bilateral leg mottling Requesting Physician:  Waymon Amato MRN #:  [id number]  History obtained from the chart as pt is unable to give information:  History of Present Illness: This is a 77 y.o. male who was found down several days ago covered in urine.  He subsequently was found to have MRSA bacteremia, suspected acute cholecystitis and acute encephalopathy.  It is reported that he is normally alert and walks his dog daily.  He has a hx of AVR with bovine valve, hx of afib but not on AC, hx of GIB, HTN, HLD, depression and etoh use, hx of ascending aortic aneurysm of 4.3cm in diameter in 2016.  He was seen today and found to have some mottling of both legs and a vascular consult was obtained.    He continues to be somnolent upon my exam.  He did have Tm of 101.2 this morning but has been afebrile since then.  Cholecystitis has been r/o since admission.  His HIDA scan was normal.  There is reported etoh use, but UDS on admit was negative.  Ammonia levels were normal.  He has been found to be in Afib with controlled rate.  He is reportedly not on AC due to hx of GIB.   He does have a mildly elevated troponin felt this could be ischemia from sepsis.  Cards consulted for TEE.  He does have a thrombocytopenia with a platelet count today of 89k.  He did have an AKI upon admit but his creatinine has normalized.   Pt is on Rifampin and Vanc.  Past Medical History:  Diagnosis Date  . Aortic stenosis   . Arthritis   . CHF (congestive heart failure) (HCC)   . Chicken pox   . Depression   . Diabetes mellitus without complication (HCC)   . Heart murmur   . History of fainting spells of unknown cause   . Hyperlipidemia   . Hypertension   . Neuromuscular disorder (HCC)   . Urinary incontinence     Past Surgical History:  Procedure Laterality Date  . AORTIC VALVE REPLACEMENT N/A 04/26/2014   Procedure: AORTIC VALVE REPLACEMENT (AVR);  Surgeon: Loreli Slot, MD;  Location: [facility] OR;  Service: Open Heart Surgery;  Laterality: N/A;  . CARDIAC CATHETERIZATION  03/2014  . ESOPHAGOGASTRODUODENOSCOPY N/A 07/04/2014   Procedure: ESOPHAGOGASTRODUODENOSCOPY (EGD);  Surgeon: Jeani Hawking, MD;  Location: [facility] ENDOSCOPY;  Service: Endoscopy;  Laterality: N/A;  . goiter  2008  . MAZE N/A 04/26/2014   Procedure: MAZE;  Surgeon: Loreli Slot, MD;  Location: [facility] OR;  Service: Open Heart Surgery;  Laterality: N/A;  . MULTIPLE EXTRACTIONS WITH ALVEOLOPLASTY N/A 04/24/2014   Procedure: Extraction of tooth #'s 2,3,5,12,13,14,15,20 with allveoloplasty and gross debridement of remaining dentition and sectioning of bridge at mesial of abutment tooth #32.;  Surgeon: Charlynne Pander, DDS;  Location: [facility] OR;  Service: Oral Surgery;  Laterality: N/A;  . TEE WITHOUT CARDIOVERSION N/A 04/26/2014   Procedure: TRANSESOPHAGEAL ECHOCARDIOGRAM (TEE);  Surgeon: Loreli Slot, MD;  Location: [facility] OR;  Service: Open Heart Surgery;  Laterality: N/A;    No Known Allergies  Prior to Admission medications   Medication Sig Start Date End Date Taking? Authorizing Provider  furosemide (LASIX) 20 MG tablet Take 20 mg by mouth daily as needed (leg swelling).   Yes [provider]  gabapentin (NEURONTIN) 400 MG capsule Take 400 mg by mouth 2 (two) times daily.  Yes [provider]  metFORMIN (GLUCOPHAGE) 500 MG tablet Take 500 mg by mouth 2 (two) times daily with a meal.   Yes [provider]  metoprolol tartrate (LOPRESSOR) 25 MG tablet Take 37.5 mg by mouth 2 (two) times daily.    Yes [provider]  OVER THE COUNTER MEDICATION Take 1 tablet by mouth daily. Collagen w Vit C   Yes [provider]  sertraline (ZOLOFT) 50 MG tablet Take 1 tablet (50 mg total) by mouth daily. 07/31/14  Yes Antonieta Iba, MD  tamsulosin (FLOMAX) 0.4 MG CAPS capsule Take 1 capsule (0.4 mg total) by mouth daily. 07/31/14  Yes Gollan, Tollie Pizza, MD    tiZANidine (ZANAFLEX) 4 MG tablet Take 4 mg by mouth 2 (two) times daily as needed for muscle spasms.   Yes [provider]  vitamin B-12 (CYANOCOBALAMIN) 1000 MCG tablet Take 1,000 mcg by mouth daily.   Yes [provider]  Vitamin D, Ergocalciferol, (DRISDOL) 50000 units CAPS capsule Take 50,000 Units by mouth every 7 (seven) days.   Yes [provider]  donepezil (ARICEPT) 10 MG tablet Take 1 tablet (10 mg total) by mouth at bedtime. 07/09/17   Burnadette Pop, MD  ibuprofen (ADVIL,MOTRIN) 200 MG tablet Take 200 mg by mouth every 6 (six) hours as needed for moderate pain.    [provider]  potassium chloride (K-DUR) 10 MEQ tablet Take 10 mEq by mouth See admin instructions. One tablet daily only with furosemide    [provider]  simvastatin (ZOCOR) 20 MG tablet Take 1 tablet (20 mg total) by mouth at bedtime. 07/09/17   Burnadette Pop, MD    Social History   Socioeconomic History  . Marital status: Divorced    Spouse name: Not on file  . Number of children: Not on file  . Years of education: Not on file  . Highest education level: Not on file  Occupational History  . Not on file  Social Needs  . Financial resource strain: Not on file  . Food insecurity:    Worry: Not on file    Inability: Not on file  . Transportation needs:    Medical: Not on file    Non-medical: Not on file  Tobacco Use  . Smoking status: Never Smoker  . Smokeless tobacco: Never Used  Substance and Sexual Activity  . Alcohol use: Yes    Alcohol/week: 0.0 standard drinks  . Drug use: Yes    Types: Marijuana    Comment: once a month  . Sexual activity: Not Currently  Lifestyle  . Physical activity:    Days per week: Not on file    Minutes per session: Not on file  . Stress: Not on file  Relationships  . Social connections:    Talks on phone: Not on file    Gets together: Not on file    Attends religious service: Not on file    Active member of club or  organization: Not on file    Attends meetings of clubs or organizations: Not on file    Relationship status: Not on file  . Intimate partner violence:    Fear of current or ex partner: Not on file    Emotionally abused: Not on file    Physically abused: Not on file    Forced sexual activity: Not on file  Other Topics Concern  . Not on file  Social History Narrative   ** Merged History Encounter **       **  Merged History Encounter **         Family History  Problem Relation Age of Onset  . Alcohol abuse Mother   . Heart disease Mother   . Diabetes Mother   . Heart disease Father   . Diabetes Father   . Cancer Mother   . Cancer Father   . Alcohol abuse Father   . Alcohol abuse Brother     ROS: [x]  Positive   [ ]  Negative   [ ]  All sytems reviewed and are negative Unable to obtain Cardiac: []  chest pain/pressure []  palpitations []  SOB lying flat []  DOE  Vascular: []  pain in legs while walking []  pain in legs at rest []  pain in legs at night []  non-healing ulcers []  hx of DVT []  swelling in legs  Pulmonary: []  productive cough []  asthma/wheezing []  home O2  Neurologic: []  weakness in []  arms []  legs []  numbness in []  arms []  legs []  hx of CVA []  mini stroke [] difficulty speaking or slurred speech []  temporary loss of vision in one eye []  dizziness  Hematologic: []  hx of cancer []  bleeding problems []  problems with blood clotting easily  Endocrine:   []  diabetes []  thyroid disease  GI []  vomiting blood []  blood in stool  GU: []  CKD/renal failure []  HD--[]  M/W/F or []  T/T/S []  burning with urination []  blood in urine  Psychiatric: []  anxiety []  depression  Musculoskeletal: []  arthritis []  joint pain  Integumentary: []  rashes []  ulcers  Constitutional: []  fever []  chills   Physical Examination  Vitals:   01/02/18 1300 01/02/18 1500  BP:    Pulse:    Resp:    Temp: 98.5 F (36.9 C) 98.4 F (36.9 C)  SpO2:     Body mass  index is 33.45 kg/m.  General:  WDWN in NAD Gait: Not observed HENT: WNL, normocephalic Pulmonary: normal non-labored breathing, without Rales, rhonchi,  wheezing Cardiac: irregularAbdomen:  soft, NT/ND, no masses Skin: mottling BLE Vascular Exam/Pulses:  Right Left  Femoral Palpable and triphasic Unable to palpate due to positioning but triphasic with doppler  DP + doppler signal 2+ (normal)  PT 2+ (normal) +doppler signal  Peroneal +doppler signal + doppler signal   Extremities:  Left foot is somewhat warm; right foot cooler; bilateral mottling of BLE     Musculoskeletal: no muscle wasting or atrophy  Neurologic: A&O X 3;  No focal weakness or paresthesias are detected; speech is fluent/normal  CBC    Component Value Date/Time   WBC 12.5 (H) 01/02/2018 0531   RBC 3.37 (L) 01/02/2018 0531   HGB 8.9 (L) 01/02/2018 0531   HGB 10.0 (L) 03/22/2014 0404   HCT 29.8 (L) 01/02/2018 0531   HCT 30.6 (L) 03/22/2014 0404   PLT 89 (L) 01/02/2018 0531   PLT 160 03/22/2014 0404   MCV 88.4 01/02/2018 0531   MCV 93 03/22/2014 0404   MCH 26.4 01/02/2018 0531   MCHC 29.9 (L) 01/02/2018 0531   RDW 17.3 (H) 01/02/2018 0531   RDW 14.4 03/22/2014 0404   LYMPHSABS 0.1 (L) 12/28/2017 2232   LYMPHSABS 1.2 03/22/2014 0404   MONOABS 0.4 12/28/2017 2232   MONOABS 0.6 03/22/2014 0404   EOSABS 0.2 12/28/2017 2232   EOSABS 0.3 03/22/2014 0404   BASOSABS 0.0 12/28/2017 2232   BASOSABS 0.0 03/22/2014 0404    BMET    Component Value Date/Time   NA 145 01/02/2018 0531   NA 140 03/22/2014 0404   K 3.2 (L) 01/02/2018  0531   K 4.1 03/22/2014 0404   CL 120 (H) 01/02/2018 0531   CL 106 03/22/2014 0404   CO2 19 (L) 01/02/2018 0531   CO2 28 03/22/2014 0404   GLUCOSE 195 (H) 01/02/2018 0531   GLUCOSE 138 (H) 03/22/2014 0404   BUN 26 (H) 01/02/2018 0531   BUN 26 (H) 03/22/2014 0404   CREATININE 1.06 01/02/2018 0531   CREATININE 1.21 03/22/2014 0404   CALCIUM 7.8 (L) 01/02/2018 0531   CALCIUM  8.6 03/22/2014 0404   GFRNONAA >60 01/02/2018 0531   GFRNONAA >60 03/22/2014 0404   GFRNONAA >60 09/12/2013 2053   GFRAA >60 01/02/2018 0531   GFRAA >60 03/22/2014 0404   GFRAA >60 09/12/2013 2053    COAGS: Lab Results  Component Value Date   INR 1.59 12/28/2017   INR 1.18 07/04/2014   INR 1.46 04/26/2014     Non-Invasive Vascular Imaging:   CTA Aobifem with BLE runoff ordered stat and pending    ASSESSMENT/PLAN: This is a 77 y.o. male with MRSA bacteremia now with BLE mottling   -pt has palpable right PT pulse and palpable left DP pulse.  Doubt vascular etiology but will obtain stat CTA aortobifem with BLE runoff to evaluate since his renal function has improved.  Dr. Darrick Penna to see pt.     Doreatha Massed, PA-C [facility] [phone]  Agree with above.  Pt legs cool and mottled with palpable pedal pulses both legs and biphasic to triphasic doppler with palpable femoral pulses.  No abdominal mass but diffuse abdominal pain on palpation.  Will obtain CTA with runoff but suspect this appearance is due to systemic sepsis rather that arterial obstruction  Keep NPO for now  Fabienne Bruns, MD [facility] [place] Office: [phone] Pager: [phone]

## 2018-01-02 NOTE — Progress Notes (Signed)
[facility]  Date of Admission:  12/28/2017     Total days of antibiotics 6         ASSESSMENT/PLAN  Peter Weiss has MRSA bacteremia of unclear source. TTE was negative for vegetations and is scheduled for TEE. Repeat blood cultures are without growth to date. Continues to have fevers which may be related to his most recent vancomycin trough being subtherapeutic. This has been increased by the pharmacy staff. Does not appear to have abdominal infection at present. He is on Day 6 of antimicrobial therapy with vancomycin and rifampin and tolerating without problem. Mentation appears improved today compared to previous assessment. Able to obtain that he does have son who lives in [place].   1. Continue current dose of vancomycin.  2. Monitor culture and fever curves. 3. Await TEE results to rule out endocarditis.     Principal Problem:   MRSA bacteremia Active Problems:   Type II diabetes mellitus with renal manifestations (HCC)   Aortic stenosis   Atrial fibrillation, chronic   Essential hypertension   Dementia (HCC)   Normocytic anemia   Sepsis (HCC)   Acute cholecystitis   CKD (chronic kidney disease), stage III (HCC)   Chronic diastolic CHF (congestive heart failure) (HCC)   BPH (benign prostatic hyperplasia)   Acute metabolic encephalopathy   Hypokalemia   Elevated troponin   . folic acid  1 mg Oral Daily  . insulin aspart  0-5 Units Subcutaneous QHS  . insulin aspart  0-9 Units Subcutaneous TID WC  . metoprolol tartrate  37.5 mg Oral BID  . multivitamin with minerals  1 tablet Oral Daily  . simvastatin  20 mg Oral QHS  . tamsulosin  0.4 mg Oral Daily  . thiamine injection  100 mg Intravenous Daily  . vitamin B-12  1,000 mcg Oral Daily    SUBJECTIVE:  Peter Weiss was febrile at 101.2 within the last 24 hours. WBC count is stable at 12.5. TTE without obvious vegetation with recommendation for TEE. Continues to have thrombocytopenia which  is stable. Most recent vancomycin trough of 9. Repeat cultures from 10/18 are without growth to date.  Peter Weiss says he is not feeling so well today. He does have a son who lives in [place]. Denies any family/contacts in the local area.   No Known Allergies   Review of Systems: Review of Systems  Unable to perform ROS: Acuity of condition    OBJECTIVE: Vitals:   01/02/18 0229 01/02/18 0409 01/02/18 0429 01/02/18 0556  BP: 134/71  (!) 148/97   Pulse:   (!) 111   Resp: (!) 30  (!) 21   Temp:  (!) 101.2 F (38.4 C)  99.4 F (37.4 C)  TempSrc:  Axillary  Axillary  SpO2:   100%   Weight:      Height:       Body mass index is 33.45 kg/m.  Physical Exam  Constitutional: He is oriented to person, place, and time. He appears well-developed and well-nourished. He appears lethargic. He has a sickly appearance. He appears ill. No distress. Nasal cannula in place.  Cardiovascular: Regular rhythm, normal heart sounds and intact distal pulses. Tachycardia present.  Pulmonary/Chest: Effort normal and breath sounds normal. No respiratory distress. He has no wheezes. He has no rales.  Abdominal: Soft. Bowel sounds are normal. He exhibits no distension.  Neurological: He is oriented to person, place, and time. He appears lethargic.  Skin: Skin is warm and dry.  Bilateral lower extremities appear with increased mottling.    Lab Results Lab Results  Component Value Date   WBC 12.5 (H) 01/02/2018   HGB 8.9 (L) 01/02/2018   HCT 29.8 (L) 01/02/2018   MCV 88.4 01/02/2018   PLT 89 (L) 01/02/2018    Lab Results  Component Value Date   CREATININE 1.06 01/02/2018   BUN 26 (H) 01/02/2018   NA 145 01/02/2018   K 3.2 (L) 01/02/2018   CL 120 (H) 01/02/2018   CO2 19 (L) 01/02/2018    Lab Results  Component Value Date   ALT 18 01/02/2018   AST 20 01/02/2018   ALKPHOS 70 01/02/2018   BILITOT 2.8 (H) 01/02/2018     Microbiology: Recent Results (from the past 240 hour(s))  Blood  Culture (routine x 2)     Status: Abnormal   Collection Time: 12/28/17 10:32 PM  Result Value Ref Range Status   Specimen Description BLOOD RIGHT ANTECUBITAL  Final   Special Requests   Final    BOTTLES DRAWN AEROBIC AND ANAEROBIC Blood Culture results may not be optimal due to an excessive volume of blood received in culture bottles   Culture  Setup Time   Final    GRAM POSITIVE COCCI IN BOTH AEROBIC AND ANAEROBIC BOTTLES CRITICAL RESULT CALLED TO, READ BACK BY AND VERIFIED WITH: Antoine Primas PharmD 10:45 12/29/17 ([email or web address]) Performed at [facility] Lab, [address], [place], [place] [address]    Culture METHICILLIN RESISTANT STAPHYLOCOCCUS AUREUS (A)  Final   Report Status 12/31/2017 FINAL  Final   Organism ID, Bacteria METHICILLIN RESISTANT STAPHYLOCOCCUS AUREUS  Final      Susceptibility   Methicillin resistant staphylococcus aureus - MIC*    CIPROFLOXACIN <=0.5 SENSITIVE Sensitive     ERYTHROMYCIN >=8 RESISTANT Resistant     GENTAMICIN <=0.5 SENSITIVE Sensitive     OXACILLIN RESISTANT Resistant     TETRACYCLINE <=1 SENSITIVE Sensitive     VANCOMYCIN <=0.5 SENSITIVE Sensitive     TRIMETH/SULFA <=10 SENSITIVE Sensitive     CLINDAMYCIN >=8 RESISTANT Resistant     RIFAMPIN <=0.5 SENSITIVE Sensitive     Inducible Clindamycin NEGATIVE Sensitive     * METHICILLIN RESISTANT STAPHYLOCOCCUS AUREUS  Blood Culture ID Panel (Reflexed)     Status: Abnormal   Collection Time: 12/28/17 10:32 PM  Result Value Ref Range Status   Enterococcus species NOT DETECTED NOT DETECTED Final   Listeria monocytogenes NOT DETECTED NOT DETECTED Final   Staphylococcus species DETECTED (A) NOT DETECTED Final    Comment: CRITICAL RESULT CALLED TO, READ BACK BY AND VERIFIED WITH: Antoine Primas PharmD 10:45 12/29/17 ([email or web address])    Staphylococcus aureus (BCID) DETECTED (A) NOT DETECTED Final    Comment: Methicillin (oxacillin)-resistant Staphylococcus aureus (MRSA). MRSA is predictably resistant to beta-lactam  antibiotics (except ceftaroline). Preferred therapy is vancomycin unless clinically contraindicated. Patient requires contact precautions if  hospitalized. CRITICAL RESULT CALLED TO, READ BACK BY AND VERIFIED WITH: Antoine Primas PharmD 10:45 12/29/17 ([email or web address])    Methicillin resistance DETECTED (A) NOT DETECTED Final    Comment: CRITICAL RESULT CALLED TO, READ BACK BY AND VERIFIED WITH: Antoine Primas PharmD 10:45 12/29/17 ([email or web address])    Streptococcus species NOT DETECTED NOT DETECTED Final   Streptococcus agalactiae NOT DETECTED NOT DETECTED Final   Streptococcus pneumoniae NOT DETECTED NOT DETECTED Final   Streptococcus pyogenes NOT DETECTED NOT DETECTED Final   Acinetobacter baumannii NOT DETECTED NOT DETECTED Final   Enterobacteriaceae species NOT DETECTED NOT DETECTED Final  Enterobacter cloacae complex NOT DETECTED NOT DETECTED Final   Escherichia coli NOT DETECTED NOT DETECTED Final   Klebsiella oxytoca NOT DETECTED NOT DETECTED Final   Klebsiella pneumoniae NOT DETECTED NOT DETECTED Final   Proteus species NOT DETECTED NOT DETECTED Final   Serratia marcescens NOT DETECTED NOT DETECTED Final   Haemophilus influenzae NOT DETECTED NOT DETECTED Final   Neisseria meningitidis NOT DETECTED NOT DETECTED Final   Pseudomonas aeruginosa NOT DETECTED NOT DETECTED Final   Candida albicans NOT DETECTED NOT DETECTED Final   Candida glabrata NOT DETECTED NOT DETECTED Final   Candida krusei NOT DETECTED NOT DETECTED Final   Candida parapsilosis NOT DETECTED NOT DETECTED Final   Candida tropicalis NOT DETECTED NOT DETECTED Final    Comment: Performed at [facility], [address] [place], [place] [address]  Blood Culture (routine x 2)     Status: Abnormal   Collection Time: 12/28/17 11:18 PM  Result Value Ref Range Status   Specimen Description BLOOD RIGHT HAND  Final   Special Requests   Final    BOTTLES DRAWN AEROBIC AND ANAEROBIC Blood Culture results may not be optimal due to an  inadequate volume of blood received in culture bottles   Culture  Setup Time   Final    GRAM POSITIVE COCCI IN CLUSTERS IN BOTH AEROBIC AND ANAEROBIC BOTTLES CRITICAL RESULT CALLED TO, READ BACK BY AND VERIFIED WITH: Antoine Primas PharmD 10:45 12/29/17 ([email or web address])    Culture (A)  Final    STAPHYLOCOCCUS AUREUS SUSCEPTIBILITIES PERFORMED ON PREVIOUS CULTURE WITHIN THE LAST 5 DAYS. Performed at [facility], [address] [place], [place] [address]    Report Status 12/31/2017 FINAL  Final  Urine culture     Status: Abnormal   Collection Time: 12/29/17 12:03 AM  Result Value Ref Range Status   Specimen Description URINE, RANDOM  Final   Special Requests   Final    Immunocompromised Performed at [facility], [address] [place], [place] [address]    Culture (A)  Final    20,000 COLONIES/mL ENTEROCOCCUS FAECALIS 10,000 COLONIES/mL STAPHYLOCOCCUS AUREUS METHICILLIN RESISTANT STAPHYLOCOCCUS AUREUS    Report Status 01/01/2018 FINAL  Final   Organism ID, Bacteria ENTEROCOCCUS FAECALIS (A)  Final   Organism ID, Bacteria STAPHYLOCOCCUS AUREUS (A)  Final      Susceptibility   Enterococcus faecalis - MIC*    AMPICILLIN <=2 SENSITIVE Sensitive     LEVOFLOXACIN 1 SENSITIVE Sensitive     NITROFURANTOIN <=16 SENSITIVE Sensitive     VANCOMYCIN 2 SENSITIVE Sensitive     * 20,000 COLONIES/mL ENTEROCOCCUS FAECALIS   Staphylococcus aureus - MIC*    CIPROFLOXACIN <=0.5 SENSITIVE Sensitive     GENTAMICIN <=0.5 SENSITIVE Sensitive     NITROFURANTOIN <=16 SENSITIVE Sensitive     OXACILLIN >=4 RESISTANT Resistant     TETRACYCLINE <=1 SENSITIVE Sensitive     VANCOMYCIN 1 SENSITIVE Sensitive     TRIMETH/SULFA <=10 SENSITIVE Sensitive     CLINDAMYCIN >=8 RESISTANT Resistant     RIFAMPIN <=0.5 SENSITIVE Sensitive     Inducible Clindamycin NEGATIVE Sensitive     * 10,000 COLONIES/mL STAPHYLOCOCCUS AUREUS  Culture, blood (routine x 2)     Status: None (Preliminary result)   Collection  Time: 12/30/17  8:12 AM  Result Value Ref Range Status   Specimen Description BLOOD LEFT ANTECUBITAL  Final   Special Requests   Final    BOTTLES DRAWN AEROBIC ONLY Blood Culture adequate volume  Culture   Final    NO GROWTH 3 DAYS Performed at [facility], [address], [place], [place] [address]    Report Status PENDING  Incomplete  Culture, blood (routine x 2)     Status: None (Preliminary result)   Collection Time: 12/30/17  8:15 AM  Result Value Ref Range Status   Specimen Description BLOOD BLOOD LEFT HAND  Final   Special Requests   Final    BOTTLES DRAWN AEROBIC ONLY Blood Culture adequate volume   Culture   Final    NO GROWTH 3 DAYS Performed at [facility], [address], [place], [place] [address]    Report Status PENDING  Incomplete     Marcos Eke, NP [facility] [phone] Pager  01/02/2018  2:46 PM

## 2018-01-02 NOTE — Progress Notes (Signed)
Mouth Care performed for patient.

## 2018-01-02 NOTE — Procedures (Signed)
ELECTROENCEPHALOGRAM REPORT   Patient: Peter Weiss       Room #: 1O10R EEG No. ID: [id number] Age: 77 y.o.        Sex: male Referring Physician: Hongalgi Report Date:  01/02/2018        Interpreting Physician: Thana Farr  History: [name] is an 77 y.o. male with altered mental status  Medications:  Folvite, Insulin, Lopressor, MVI, Rifadin, Zocor, Flomax, Thiamine, Vancomycin, B12  Conditions of Recording:  This is a 21 channel routine scalp EEG performed with bipolar and monopolar montages arranged in accordance to the international 10/20 system of electrode placement. One channel was dedicated to EKG recording.  The patient is in the awake state.  Description:  The background activity consists of a low voltage, polymorphic delta activity that is diffusely distributed.  There is some periodic, superimposed theta and alpha activity noted as well.  This activity is continuous throughout the recording.   Stage II sleep is not obtained. No epileptiform activity is noted.   Hyperventilation and intermittent photic stimulation were not performed.   IMPRESSION: This is an abnormal EEG secondary to general background slowing.  This finding may be seen with a diffuse disturbance that is etiologically nonspecific, but may include a metabolic encephalopathy, among other possibilities.  No epileptiform activity was noted.     Thana Farr, MD Neurology [phone] 01/02/2018, 1:42 PM

## 2018-01-02 NOTE — Progress Notes (Signed)
Tried to give pt. Food/drink/meds by mouth.  But was unsuccessful.  Pt. Just held applesauce in his mouth without swallowing.  Then started to cough when given milk.  Not able to give PO meds.  Md notified.  SLP eval and treat ordered.  Diet changed to NPO.

## 2018-01-02 NOTE — Progress Notes (Signed)
CT was called at 2337 to inform them that pt had a new #20 IV and was ready for his CT.  Transport picked patient up at 2346.

## 2018-01-02 NOTE — Progress Notes (Signed)
EEG Completed; Results Pending  

## 2018-01-02 NOTE — Evaluation (Signed)
Clinical/Bedside Swallow Evaluation Patient Details  Name: Peter Weiss MRN: [id number] Date of Birth: [birth year]  Today's Date: 01/02/2018 Time: SLP Start Time (ACUTE ONLY): 1109 SLP Stop Time (ACUTE ONLY): 1125 SLP Time Calculation (min) (ACUTE ONLY): 16 min  Past Medical History:  Past Medical History:  Diagnosis Date  . Aortic stenosis   . Arthritis   . CHF (congestive heart failure) (HCC)   . Chicken pox   . Depression   . Diabetes mellitus without complication (HCC)   . Heart murmur   . History of fainting spells of unknown cause   . Hyperlipidemia   . Hypertension   . Neuromuscular disorder (HCC)   . Urinary incontinence    Past Surgical History:  Past Surgical History:  Procedure Laterality Date  . AORTIC VALVE REPLACEMENT N/A 04/26/2014   Procedure: AORTIC VALVE REPLACEMENT (AVR);  Surgeon: Loreli Slot, MD;  Location: [facility] OR;  Service: Open Heart Surgery;  Laterality: N/A;  . CARDIAC CATHETERIZATION  03/2014  . ESOPHAGOGASTRODUODENOSCOPY N/A 07/04/2014   Procedure: ESOPHAGOGASTRODUODENOSCOPY (EGD);  Surgeon: Jeani Hawking, MD;  Location: [facility] ENDOSCOPY;  Service: Endoscopy;  Laterality: N/A;  . goiter  2008  . MAZE N/A 04/26/2014   Procedure: MAZE;  Surgeon: Loreli Slot, MD;  Location: [facility] OR;  Service: Open Heart Surgery;  Laterality: N/A;  . MULTIPLE EXTRACTIONS WITH ALVEOLOPLASTY N/A 04/24/2014   Procedure: Extraction of tooth #'s 2,3,5,12,13,14,15,20 with allveoloplasty and gross debridement of remaining dentition and sectioning of bridge at mesial of abutment tooth #32.;  Surgeon: Charlynne Pander, DDS;  Location: [facility] OR;  Service: Oral Surgery;  Laterality: N/A;  . TEE WITHOUT CARDIOVERSION N/A 04/26/2014   Procedure: TRANSESOPHAGEAL ECHOCARDIOGRAM (TEE);  Surgeon: Loreli Slot, MD;  Location: [facility] OR;  Service: Open Heart Surgery;  Laterality: N/A;   HPI:  77 year old male reportedly lives at an extended stay hotel off of [address] [facility] reportedly independent with his ADLs at baseline (normally alert and oriented and walks his dog daily), PMH of aortic valve stenosis, status post AVR (bovine valve), A. fib not on anticoagulation, chronic diastolic CHF, DM 2, dementia, lumbar DDD, AAA, HTN, HLD, depression, BPH, GI bleeding, alcohol use, EMS was called out by security who had to use forced entry to enter patient's room where he was found in bed covered in urine and brought to ED for altered mental status. Found to have MRSA bacteremia.   Assessment / Plan / Recommendation Clinical Impression  Swallow evaluation complete at bedside. Patient lethargic, confused, and with difficulty sustaining to attention to task, which are primary risk factors for aspiration given that otherwise, pharyngeal swallow appears intact. Patient able to consume greater than 8 ounces of thin liquids, largely via consecutive straw sips, with no overt indication of aspiration and timely oral transit of bolus and what appears to be normal swallow initiation. Patient however flucutates in alertness and awareness, with intermittent oral holding,  requiring moderate-max cueing for awareness of bolus and initiation of swallow. Recommend at this time, downgrading diet to pureed solids, thin liquid to maximize safety due to intermittent oral holding. Position patient fully upright, turn on lights, etc to maxmize alertness before attempting to feed. Have patient self feed if able. Hold pos if too lethargic.  SLP Visit Diagnosis: Dysphagia, oral phase (R13.11)    Aspiration Risk  Moderate aspiration risk    Diet Recommendation Dysphagia 1 (Puree);Thin liquid   Liquid Administration via: Cup;Straw Medication Administration: Crushed with puree Supervision: Staff  to assist with self feeding;Full supervision/cueing for compensatory strategies Compensations: Slow rate;Small sips/bites Postural Changes: Seated upright at 90 degrees    Other  Recommendations Oral  Care Recommendations: Oral care BID   Follow up Recommendations None      Frequency and Duration min 2x/week  1 week       Prognosis Prognosis for Safe Diet Advancement: Good Barriers to Reach Goals: Cognitive deficits      Swallow Study   General HPI: 77 year old male reportedly lives at an extended stay hotel off of [address] [facility] reportedly independent with his ADLs at baseline (normally alert and oriented and walks his dog daily), PMH of aortic valve stenosis, status post AVR (bovine valve), A. fib not on anticoagulation, chronic diastolic CHF, DM 2, dementia, lumbar DDD, AAA, HTN, HLD, depression, BPH, GI bleeding, alcohol use, EMS was called out by security who had to use forced entry to enter patient's room where he was found in bed covered in urine and brought to ED for altered mental status. Found to have MRSA bacteremia. Type of Study: Bedside Swallow Evaluation Previous Swallow Assessment: none Diet Prior to this Study: NPO Temperature Spikes Noted: Yes Respiratory Status: Room air History of Recent Intubation: No Behavior/Cognition: Lethargic/Drowsy;Confused;Requires cueing Oral Cavity Assessment: Within Functional Limits Oral Care Completed by SLP: Recent completion by staff Oral Cavity - Dentition: Adequate natural dentition Vision: Functional for self-feeding Self-Feeding Abilities: Needs assist(due to AMS) Patient Positioning: Upright in bed Baseline Vocal Quality: Normal Volitional Cough: Cognitively unable to elicit Volitional Swallow: Unable to elicit    Oral/Motor/Sensory Function Overall Oral Motor/Sensory Function: Within functional limits   Ice Chips Ice chips: Impaired Presentation: Spoon Oral Phase Functional Implications: Prolonged oral transit;Oral holding   Thin Liquid Thin Liquid: Impaired Presentation: Straw Oral Phase Impairments: Poor awareness of bolus    Nectar Thick Nectar Thick Liquid: Not tested   Honey Thick Honey Thick  Liquid: Not tested   Puree Puree: Impaired Presentation: Spoon Oral Phase Impairments: Poor awareness of bolus Oral Phase Functional Implications: Oral holding   Solid     Solid: Not tested     Michel Hendon MA, CCC-SLP   Preslynn Bier Meryl 01/02/2018,11:33 AM

## 2018-01-02 NOTE — Progress Notes (Signed)
Temp=99.4 after the Tylenol suppository. More alert and talkative to this RN in coherent sentences. Requested for ice chip. Patient was appreciative when the ice chips were offered.  Will continue to monitor.

## 2018-01-03 ENCOUNTER — Inpatient Hospital Stay (HOSPITAL_COMMUNITY): Payer: Medicare Other

## 2018-01-03 DIAGNOSIS — M79604 Pain in right leg: Secondary | ICD-10-CM

## 2018-01-03 DIAGNOSIS — R1011 Right upper quadrant pain: Secondary | ICD-10-CM

## 2018-01-03 DIAGNOSIS — M79605 Pain in left leg: Secondary | ICD-10-CM

## 2018-01-03 DIAGNOSIS — M791 Myalgia, unspecified site: Secondary | ICD-10-CM

## 2018-01-03 DIAGNOSIS — G9340 Encephalopathy, unspecified: Secondary | ICD-10-CM

## 2018-01-03 LAB — HEPATITIS C ANTIBODY: HCV Ab: <0.1 s/co ratio (ref 0.0–0.9)

## 2018-01-03 LAB — BASIC METABOLIC PANEL
ANION GAP: 4 — AB (ref 5–15)
BUN: 23 mg/dL (ref 8–23)
CO2: 20 mmol/L — ABNORMAL LOW (ref 22–32)
Calcium: 7.4 mg/dL — ABNORMAL LOW (ref 8.9–10.3)
Chloride: 116 mmol/L — ABNORMAL HIGH (ref 98–111)
Creatinine, Ser: 1.22 mg/dL (ref 0.61–1.24)
GFR calc Af Amer: >60 mL/min (ref >60)
GFR, EST NON AFRICAN AMERICAN: 56 mL/min — AB (ref >60)
GLUCOSE: 212 mg/dL — AB (ref 70–99)
POTASSIUM: 4.2 mmol/L (ref 3.5–5.1)
Sodium: 140 mmol/L (ref 135–145)

## 2018-01-03 LAB — TSH: TSH: 1.536 u[IU]/mL (ref 0.350–4.500)

## 2018-01-03 LAB — GLUCOSE, CAPILLARY
GLUCOSE-CAPILLARY: 176 mg/dL — AB (ref 70–99)
GLUCOSE-CAPILLARY: 162 mg/dL — AB (ref 70–99)
GLUCOSE-CAPILLARY: 160 mg/dL — AB (ref 70–99)
Glucose-Capillary: 202 mg/dL — ABNORMAL HIGH (ref 70–99)
Glucose-Capillary: 128 mg/dL — ABNORMAL HIGH (ref 70–99)

## 2018-01-03 LAB — VITAMIN B12: VITAMIN B 12: 2945 pg/mL — AB (ref 180–914)

## 2018-01-03 LAB — CK: Total CK: <5 U/L — ABNORMAL LOW (ref 49–397)

## 2018-01-03 MED ORDER — SODIUM CHLORIDE 0.9 % IV SOLN
INTRAVENOUS | Status: DC
  Administered 2018-01-03: 18:00:00 via INTRAVENOUS

## 2018-01-03 MED ORDER — IOPAMIDOL (ISOVUE-370) INJECTION 76%
100.0000 mL | Freq: Once | INTRAVENOUS | Status: AC | PRN
  Administered 2018-01-03: 100 mL via INTRAVENOUS

## 2018-01-03 NOTE — Progress Notes (Signed)
[facility]  Date of Admission:  12/28/2017     Total days of antibiotics 7         ASSESSMENT/PLAN  [name] is on Day 7 of antimicrobial therapy with vancomycin and rifampin for MRSA bacteremia of unclear source. States he has right upper quadrant abdominal pain and bilateral lower extremity pain. Unlikely sources of infection with negative work up to date. He does remain rather lethargic and question possibility of intracranial process. Fever curve appears to be down trending despite mild elevated temperature of 100 in the last 24 hours. TEE planned for tomorrow to rule out endocarditis. Repeat blood cultures remain without growth to date. Clinically appears to be improving slowly. Vascular Surgery with mottling that not likely of vascular origin and possibly infection.    1. Continue current dose of vancomycin and rifampin. 2. Monitor cultures and fever curve.  3. Await TEE results.   Principal Problem:   MRSA bacteremia Active Problems:   Type II diabetes mellitus with renal manifestations (HCC)   Aortic stenosis   Atrial fibrillation, chronic   Essential hypertension   Dementia (HCC)   Normocytic anemia   Sepsis (HCC)   Acute cholecystitis   CKD (chronic kidney disease), stage III (HCC)   Chronic diastolic CHF (congestive heart failure) (HCC)   BPH (benign prostatic hyperplasia)   Acute metabolic encephalopathy   Hypokalemia   Elevated troponin   . folic acid  1 mg Oral Daily  . insulin aspart  0-5 Units Subcutaneous QHS  . insulin aspart  0-9 Units Subcutaneous TID WC  . metoprolol tartrate  37.5 mg Oral BID  . multivitamin with minerals  1 tablet Oral Daily  . simvastatin  20 mg Oral QHS  . tamsulosin  0.4 mg Oral Daily  . thiamine injection  100 mg Intravenous Daily  . vitamin B-12  1,000 mcg Oral Daily    SUBJECTIVE:  Mild elevated temperature of 100.1 in the past 24 hours. Evaluated by Vascular Surgery with no interventions  needed at present. Repeat blood cultures from 10/18 without growth to date. Plans for TEE tomorrow.   Continually states "oh my God." Has some pain located in his upper abdomen and bilateral lower extremities.   No Known Allergies   Review of Systems: Review of Systems  Unable to perform ROS: Acuity of condition    OBJECTIVE: Vitals:   01/03/18 0800 01/03/18 0835 01/03/18 0900 01/03/18 1000  BP:  132/73    Pulse: (!) 105  (!) 103 (!) 105  Resp: (!) 26 19 17  (!) 24  Temp:      TempSrc:      SpO2: 97%  96% 98%  Weight:      Height:       Body mass index is 33.45 kg/m.  Physical Exam  Constitutional: He is oriented to person, place, and time. He appears well-developed. He appears lethargic. He has a sickly appearance. He appears ill. No distress.  Cardiovascular: Normal rate. An irregular rhythm present.  Pulmonary/Chest: Effort normal and breath sounds normal.  Neurological: He is oriented to person, place, and time. He appears lethargic.  Skin: Skin is warm and dry.  Psychiatric: He has a normal mood and affect. His behavior is normal. Judgment and thought content normal.    Lab Results Lab Results  Component Value Date   WBC 12.5 (H) 01/02/2018   HGB 8.9 (L) 01/02/2018   HCT 29.8 (L) 01/02/2018   MCV 88.4 01/02/2018  PLT 89 (L) 01/02/2018    Lab Results  Component Value Date   CREATININE 1.22 01/03/2018   BUN 23 01/03/2018   NA 140 01/03/2018   K 4.2 01/03/2018   CL 116 (H) 01/03/2018   CO2 20 (L) 01/03/2018    Lab Results  Component Value Date   ALT 18 01/02/2018   AST 20 01/02/2018   ALKPHOS 70 01/02/2018   BILITOT 2.8 (H) 01/02/2018     Microbiology: Recent Results (from the past 240 hour(s))  Blood Culture (routine x 2)     Status: Abnormal   Collection Time: 12/28/17 10:32 PM  Result Value Ref Range Status   Specimen Description BLOOD RIGHT ANTECUBITAL  Final   Special Requests   Final    BOTTLES DRAWN AEROBIC AND ANAEROBIC Blood Culture  results may not be optimal due to an excessive volume of blood received in culture bottles   Culture  Setup Time   Final    GRAM POSITIVE COCCI IN BOTH AEROBIC AND ANAEROBIC BOTTLES CRITICAL RESULT CALLED TO, READ BACK BY AND VERIFIED WITH: Antoine Primas PharmD 10:45 12/29/17 ([email or web address]) Performed at [facility], [address] [place], [place] [address]    Culture METHICILLIN RESISTANT STAPHYLOCOCCUS AUREUS (A)  Final   Report Status 12/31/2017 FINAL  Final   Organism ID, Bacteria METHICILLIN RESISTANT STAPHYLOCOCCUS AUREUS  Final      Susceptibility   Methicillin resistant staphylococcus aureus - MIC*    CIPROFLOXACIN <=0.5 SENSITIVE Sensitive     ERYTHROMYCIN >=8 RESISTANT Resistant     GENTAMICIN <=0.5 SENSITIVE Sensitive     OXACILLIN RESISTANT Resistant     TETRACYCLINE <=1 SENSITIVE Sensitive     VANCOMYCIN <=0.5 SENSITIVE Sensitive     TRIMETH/SULFA <=10 SENSITIVE Sensitive     CLINDAMYCIN >=8 RESISTANT Resistant     RIFAMPIN <=0.5 SENSITIVE Sensitive     Inducible Clindamycin NEGATIVE Sensitive     * METHICILLIN RESISTANT STAPHYLOCOCCUS AUREUS  Blood Culture ID Panel (Reflexed)     Status: Abnormal   Collection Time: 12/28/17 10:32 PM  Result Value Ref Range Status   Enterococcus species NOT DETECTED NOT DETECTED Final   Listeria monocytogenes NOT DETECTED NOT DETECTED Final   Staphylococcus species DETECTED (A) NOT DETECTED Final    Comment: CRITICAL RESULT CALLED TO, READ BACK BY AND VERIFIED WITH: Antoine Primas PharmD 10:45 12/29/17 ([email or web address])    Staphylococcus aureus (BCID) DETECTED (A) NOT DETECTED Final    Comment: Methicillin (oxacillin)-resistant Staphylococcus aureus (MRSA). MRSA is predictably resistant to beta-lactam antibiotics (except ceftaroline). Preferred therapy is vancomycin unless clinically contraindicated. Patient requires contact precautions if  hospitalized. CRITICAL RESULT CALLED TO, READ BACK BY AND VERIFIED WITH: Antoine Primas PharmD 10:45 12/29/17  ([email or web address])    Methicillin resistance DETECTED (A) NOT DETECTED Final    Comment: CRITICAL RESULT CALLED TO, READ BACK BY AND VERIFIED WITH: Antoine Primas PharmD 10:45 12/29/17 ([email or web address])    Streptococcus species NOT DETECTED NOT DETECTED Final   Streptococcus agalactiae NOT DETECTED NOT DETECTED Final   Streptococcus pneumoniae NOT DETECTED NOT DETECTED Final   Streptococcus pyogenes NOT DETECTED NOT DETECTED Final   Acinetobacter baumannii NOT DETECTED NOT DETECTED Final   Enterobacteriaceae species NOT DETECTED NOT DETECTED Final   Enterobacter cloacae complex NOT DETECTED NOT DETECTED Final   Escherichia coli NOT DETECTED NOT DETECTED Final   Klebsiella oxytoca NOT DETECTED NOT DETECTED Final   Klebsiella pneumoniae NOT DETECTED NOT DETECTED Final   Proteus species NOT DETECTED NOT DETECTED Final  Serratia marcescens NOT DETECTED NOT DETECTED Final   Haemophilus influenzae NOT DETECTED NOT DETECTED Final   Neisseria meningitidis NOT DETECTED NOT DETECTED Final   Pseudomonas aeruginosa NOT DETECTED NOT DETECTED Final   Candida albicans NOT DETECTED NOT DETECTED Final   Candida glabrata NOT DETECTED NOT DETECTED Final   Candida krusei NOT DETECTED NOT DETECTED Final   Candida parapsilosis NOT DETECTED NOT DETECTED Final   Candida tropicalis NOT DETECTED NOT DETECTED Final    Comment: Performed at [facility], [address], [place], [place] [address]  Blood Culture (routine x 2)     Status: Abnormal   Collection Time: 12/28/17 11:18 PM  Result Value Ref Range Status   Specimen Description BLOOD RIGHT HAND  Final   Special Requests   Final    BOTTLES DRAWN AEROBIC AND ANAEROBIC Blood Culture results may not be optimal due to an inadequate volume of blood received in culture bottles   Culture  Setup Time   Final    GRAM POSITIVE COCCI IN CLUSTERS IN BOTH AEROBIC AND ANAEROBIC BOTTLES CRITICAL RESULT CALLED TO, READ BACK BY AND VERIFIED WITH: Antoine Primas PharmD 10:45 12/29/17  ([email or web address])    Culture (A)  Final    STAPHYLOCOCCUS AUREUS SUSCEPTIBILITIES PERFORMED ON PREVIOUS CULTURE WITHIN THE LAST 5 DAYS. Performed at [facility], [address], [place], [place] [address]    Report Status 12/31/2017 FINAL  Final  Urine culture     Status: Abnormal   Collection Time: 12/29/17 12:03 AM  Result Value Ref Range Status   Specimen Description URINE, RANDOM  Final   Special Requests   Final    Immunocompromised Performed at [facility], [address] [place], [place] [address]    Culture (A)  Final    20,000 COLONIES/mL ENTEROCOCCUS FAECALIS 10,000 COLONIES/mL STAPHYLOCOCCUS AUREUS METHICILLIN RESISTANT STAPHYLOCOCCUS AUREUS    Report Status 01/01/2018 FINAL  Final   Organism ID, Bacteria ENTEROCOCCUS FAECALIS (A)  Final   Organism ID, Bacteria STAPHYLOCOCCUS AUREUS (A)  Final      Susceptibility   Enterococcus faecalis - MIC*    AMPICILLIN <=2 SENSITIVE Sensitive     LEVOFLOXACIN 1 SENSITIVE Sensitive     NITROFURANTOIN <=16 SENSITIVE Sensitive     VANCOMYCIN 2 SENSITIVE Sensitive     * 20,000 COLONIES/mL ENTEROCOCCUS FAECALIS   Staphylococcus aureus - MIC*    CIPROFLOXACIN <=0.5 SENSITIVE Sensitive     GENTAMICIN <=0.5 SENSITIVE Sensitive     NITROFURANTOIN <=16 SENSITIVE Sensitive     OXACILLIN >=4 RESISTANT Resistant     TETRACYCLINE <=1 SENSITIVE Sensitive     VANCOMYCIN 1 SENSITIVE Sensitive     TRIMETH/SULFA <=10 SENSITIVE Sensitive     CLINDAMYCIN >=8 RESISTANT Resistant     RIFAMPIN <=0.5 SENSITIVE Sensitive     Inducible Clindamycin NEGATIVE Sensitive     * 10,000 COLONIES/mL STAPHYLOCOCCUS AUREUS  Culture, blood (routine x 2)     Status: None (Preliminary result)   Collection Time: 12/30/17  8:12 AM  Result Value Ref Range Status   Specimen Description BLOOD LEFT ANTECUBITAL  Final   Special Requests   Final    BOTTLES DRAWN AEROBIC ONLY Blood Culture adequate volume   Culture   Final    NO GROWTH 4 DAYS Performed at  [facility], [address], [place], [place] [address]    Report Status PENDING  Incomplete  Culture, blood (routine x 2)     Status: None (  Preliminary result)   Collection Time: 12/30/17  8:15 AM  Result Value Ref Range Status   Specimen Description BLOOD BLOOD LEFT HAND  Final   Special Requests   Final    BOTTLES DRAWN AEROBIC ONLY Blood Culture adequate volume   Culture   Final    NO GROWTH 4 DAYS Performed at [facility], [address] [place], [place] [address]    Report Status PENDING  Incomplete     Marcos Eke, NP [facility] [phone] Pager  01/03/2018  10:23 AM

## 2018-01-03 NOTE — Progress Notes (Signed)
SLP Cancellation Note  Patient Details Name: Peter Weiss MRN: [id number] DOB: February 11, 1941   Cancelled treatment:       Reason Eval/Treat Not Completed: Patient unavailable - nursing care. RN reports pt refused lunch today. Will continue efforts when pt available.  Kinberly Perris B. Murvin Natal [facility], CCC-SLP Speech Language Pathologist [phone]  Leigh Aurora 01/03/2018, 2:06 PM

## 2018-01-03 NOTE — Progress Notes (Signed)
Patient back on unit from CTA.

## 2018-01-03 NOTE — Progress Notes (Signed)
CT reviewed.  No aneurysm in abdominal aorta no mesenteric occlusion.  Has inline flow to feet.  No obvious vascular issue will sign off  Fabienne Bruns, MD [facility] Office: [phone] Pager: [phone]

## 2018-01-03 NOTE — Progress Notes (Addendum)
PROGRESS NOTE   Peter Weiss  DGU:[id number]    DOB: 1940/06/28    DOA: 12/28/2017  PCP: Nolene Ebbs, MD   I have briefly reviewed patients previous medical records in [facility].  Brief Narrative:  77 year old male reportedly lives at an extended stay hotel off of [facility] reportedly independent with his ADLs at baseline (normally alert and oriented and walks his dog daily), PMH of aortic valve stenosis, status post AVR (bovine valve), A. fib not on anticoagulation, chronic diastolic CHF, DM 2, dementia, lumbar DDD, AAA, HTN, HLD, depression, BPH, GI bleeding, alcohol use, EMS was called out by security who had to use forced entry to enter patient's room where he was found in bed covered in urine and brought to ED for altered mental status.  He was unable to provide any history in the ED.  He was admitted for MRSA bacteremia/sepsis- source unclear yet and acute encephalopathy.  Cardiology consulted due to elevated troponin's and signed off.  General surgery/IR consulted for suspicion for acute cholecystitis which was ruled out and they signed off.  ID consulted for MRSA bacteremia.  Mental status very slowly improving but ongoing waxing and waning mental status.  Vascular surgery seen for mottling of legs and signed off.  TEE likely on 01/04/2018.  MRI brain requested.  Assessment & Plan:   Principal Problem:   MRSA bacteremia Active Problems:   Type II diabetes mellitus with renal manifestations (HCC)   Aortic stenosis   Atrial fibrillation, chronic   Essential hypertension   Dementia (HCC)   Normocytic anemia   Sepsis ([place])   Acute cholecystitis   CKD (chronic kidney disease), stage III (HCC)   Chronic diastolic CHF (congestive heart failure) (HCC)   BPH (benign prostatic hyperplasia)   Acute metabolic encephalopathy   Hypokalemia   Elevated troponin   Sepsis due to MRSA bacteremia Met sepsis criteria on admission.  Blood cultures x2 and BCID  showed MRSA.  Treated per sepsis protocol with IV fluids and initiated on IV vancomycin and Zosyn.  ID consultation and follow-up appreciated.  Source of infection unclear at this time.  TTE was limited study and showed no vegetations.  Zosyn discontinued.  Continue vancomycin and rifampicin until endocarditis ruled out.  Cardiology consulted for TEE, likely for 01/04/2018.  Has low-grade intermittent fever and mild leukocytosis.  Surveillance blood cultures from 10/18: Negative to date.  Urine culture 10/17 shows insignificant growth (20 K Enterococcus faecalis and 10 K MRSA).  No change in treatment.  Discussed with Dr. Baxter Flattery  Cholecystitis ruled out CT abdomen and pelvis with contrast 10/17: Gallbladder wall thickening and edema with surrounding infiltration suggesting acute cholecystitis.  General surgery was consulted.  HIDA scan was normal.  Cholecystitis ruled out.  Acute toxic metabolic encephalopathy Baseline mental status not exactly known.  Exact etiology not clear but possibly related to sepsis.  CT head 12/29/2017: No acute intracranial abnormality noted.  No focal deficits.  Delirium precautions.  Alcohol use listed but amounts not known.  UDS negative.  Ammonia levels normal.  Avoid QT prolonging medications due to prolonged QTC. Has not received any Ativan since 10/17.  No CO2 retention on VBG.  EEG shows encephalopathic picture and no seizures.  Mild has now resolved.  Mental status has somewhat improved compared to admission, more alert and communicative at times but other times lethargic and somnolent.  This is despite appropriate treatment for bacteremia, extensive unremarkable evaluation otherwise and no medications likely to  contribute to mental status changes.  Check TSH (normal in April), B12 level, RPR and thiamine level.  Requested MRI of brain without contrast to further evaluate.  Discussed with ID (do not see need for LP at this time) and Radiology.  Paroxysmal A. fib with mild  RVR CHA2DS2-VASc Score is 5.  Patient reportedly not anticoagulation candidate due to history of GI bleed.  Monitor on telemetry.  Controlled ventricular rate in the 100s.  Continue metoprolol home dose of metoprolol 37.5 mg twice daily.  Elevated troponin: No report of chest pain.  Mild increased from 0.16-0.26 but plateaued thereafter.  Could be demand ischemia from sepsis.  Cardiology signed off.  Continue rectal aspirin.  TTE shows normal EF.  Requested Cardiology for TEE to rule out endocarditis.  Aortic stenosis status post AVR Limited TTE without obvious vegetations.  Cardiology consulted for TEE.  DM 2 with renal manifestations A1c 7.1.  CBGs mildly uncontrolled as below.  Continue SSI.  Developing peripheral volume overload, discontinued IV fluids.  Essential hypertension Uncontrolled.  Resumed oral metoprolol 37.5 mg twice daily.  PRN IV hydralazine.  Monitor and adjust as needed.  Dementia Continue to hold Aricept until mental status further improves.  Normocytic anemia Hemoglobin has dropped from 9-7.8.  No overt bleeding.  Could be dilutional versus critical illness.  Transfuse if hemoglobin 7 or less.  Hemoglobin stable in the mid 8 g range..  Thrombocytopenia Could be related to sepsis.  No bleeding reported.  Hemoglobin has dropped further from 95-78.  Platelets have slightly improved to the 90s.  Follow CBC in a.m.  Acute on stage III chronic kidney disease Baseline creatinine not clear but may be in the 1-1.3 range.  Creatinine peaked to 1.51 but has normalized.  Acute kidney injury resolved.  Follow BMP periodically.  Chronic diastolic CHF: TTE 10/30/5629 showed LVEF 65-70%.  Does not appear volume overloaded either clinically or radiologically.  Holding Lasix.  Chest x-ray 10/19 personally reviewed, poor inspiratory effort but no overt pulmonary edema.  Peripherally volume overloaded, discontinued IV fluids.  BPH Resumed Flomax.  Foley catheter placed in ED,  indication not clear.  Foley catheter discontinued 10/19 and voiding without difficulty.  Hypokalemia Replaced.  Hypomagnesemia Magnesium 1.2 on admission.  Replaced.  Metabolic acidosis/elevated lactate Peak lactate 3.12.  Lactate has normalized.  Bicarbonate down to 13, anion gap 10 on 10/19.  Likely due to hyperchloremia.  Normal saline was discontinued and D5 infusion was started 10/19.  Hypernatremia has normalized, hyperchloremia and acidosis improving.  DC IV fluids and follow BMP.  Prolonged QTC Replace hypokalemia and hypomagnesemia.  Monitor on telemetry.  Avoid QT prolonging medications.  EKG 12/29/2017: RBBB, QTC 533 (not sure if accurate due to BBB).  Stable mass within the pituitary fossa:  Noted on CT head 10/17.  Mild mass-effect on the optic chiasm and prechiasmatic optic nerves.  Unclear etiology.  Follow MRI brain as noted above.  Obesity/Body mass index is 33.45 kg/m.   Hypernatremia Possibly from NS hydration.  Discontinued normal saline and initiated D5 infusion on 10/19.  Resolved.  Dysphagia As per speech therapy evaluation 10/21, dysphagia 1 diet and thin liquids.  No change.  B/L Leg mottling, R>L Vascular surgery follow-up appreciated.  CTA with runoff reviewed: No aneurysm and abdominal aorta, no centric occlusion.  Has in-line flow to feet.  No obvious vascular issue.  Suspect findings due to systemic sepsis.  Looks slightly better today.   DVT prophylaxis: SCDs Code Status: Full Family Communication: Unable  to reach anyone on patient's behalf.  As per RN, patient's son called stating that he would be coming to the hospital today.  Will discuss with family when available. Disposition: To be determined further clinical improvement, evaluation (TEE pending) and PT evaluation.   Consultants:  General surgery Interventional radiology Cardiology Infectious disease Vascular surgery  Procedures:  Foley catheter 10/17 > 10/19  Antimicrobials:  IV  vancomycin 10/17 > IV rifampin 10/19 > Zosyn discontinued   Subjective: Lethargic but easily arousable.  Wished him good morning-his response "if that is how you feel".  "I want to get the F out of here".  Denies any specific complaints.  No pain reported to me.  States that he has 2 sons, 1 who lives in [place] and another who lives in [place].  Intermittently refusing diet.  ROS: Unable.  Objective:  Vitals:   01/03/18 0900 01/03/18 1000 01/03/18 1200 01/03/18 1647  BP:      Pulse: (!) 103 (!) 105 94   Resp: 17 (!) 24 (!) 31   Temp:    (!) 100.4 F (38 C)  TempSrc:    Axillary  SpO2: 96% 98%    Weight:      Height:        Examination:  General exam: Elderly male, moderately built and obese, lying comfortably propped up in bed without distress.  Again looks lethargic this morning. Respiratory system: Midline sternotomy scar.  Slightly diminished breath sounds in the bases but no wheezing, rhonchi or crackles.  No increased work of breathing.   Cardiovascular system: S1 & S2 heard, irregularly irregular. No JVD, murmurs, rubs, gallops or clicks.  Trace bilateral ankle edema.  Telemetry personally reviewed: Paroxysmal A. fib with ventricular rate in the 100s-110s, ST.  BBB morphology. Gastrointestinal system: Abdomen is nondistended but obese, soft and nontender. No organomegaly or masses felt. Normal bowel sounds heard.  Stable Central nervous system: Lethargic but when aroused, briefly alert and oriented to self, partly to place.  Answers some questions appropriately.  No focal neurological deficits. Extremities: Symmetric 5 x 5 power. Below knee, L leg > R leg mottling which appears better compared to yesterday. Palpable left DP and right PT and b/l Femorals. Right leg cooler to touch than Left and again both legs feel warmer today than yesterday.  Moves all extremities by himself. Skin: Generalized multiple skin ulcers at different stages of healing but none appear acutely  infected. Psychiatry: Judgement and insight impaired. Mood & affect flat. Neck: supple.      Data Reviewed: I have personally reviewed following labs and imaging studies  CBC: Recent Labs  Lab 12/28/17 2232  12/29/17 0753 12/30/17 0812 12/31/17 1205 01/01/18 0347 01/02/18 0531  WBC 14.0*   < > 13.7* 8.6 11.1* 13.6* 12.5*  NEUTROABS 13.2*  --   --   --   --   --   --   HGB 10.2*   < > 9.0* 7.8* 8.8* 8.8* 8.9*  HCT 33.0*   < > 30.6* 25.5* 28.8* 29.7* 29.8*  MCV 88.9   < > 90.5 89.5 89.7 88.4 88.4  PLT 166   < > 95* 78* 91* 94* 89*   < > = values in this interval not displayed.   Basic Metabolic Panel: Recent Labs  Lab 12/28/17 2318  12/29/17 0753  12/31/17 0607 12/31/17 1629 01/01/18 0347 01/01/18 1710 01/02/18 0531 01/03/18 0351  NA  --    < > 136   < > 147* 147* 147*  144 145 140  K  --    < > 3.5   < > 4.4 3.9 3.8 2.9* 3.2* 4.2  CL  --    < > 107   < > 124* 123* 122* 123* 120* 116*  CO2  --    < > 17*   < > 13* 17* 18* 20* 19* 20*  GLUCOSE  --    < > 255*   < > 180* 230* 223* 220* 195* 212*  BUN  --    < > 23   < > 39* 40* 38* 32* 26* 23  CREATININE  --    < > 1.48*   < > 1.39* 1.32* 1.21 1.14 1.06 1.22  CALCIUM  --    < > 7.5*   < > 7.7* 7.8* 8.0* 7.8* 7.8* 7.4*  MG 1.2*  --  1.6*  --  2.2  --   --   --   --   --    < > = values in this interval not displayed.   Liver Function Tests: Recent Labs  Lab 12/29/17 0254 12/29/17 0753 12/30/17 0812 12/31/17 0607 01/02/18 0531  AST 24 34 26 45* 20  ALT 12 15 15 23 18   ALKPHOS 33* 35* 34* 71 70  BILITOT 2.0* 1.9* 1.0 2.9* 2.8*  PROT 5.8* 6.1* 5.3* 5.7* 5.3*  ALBUMIN 2.7* 2.7* 2.3* 2.4* 2.0*   Coagulation Profile: Recent Labs  Lab 12/28/17 2318  INR 1.59   Cardiac Enzymes: Recent Labs  Lab 12/28/17 2318 12/29/17 0254 12/29/17 0753 12/29/17 1559 01/03/18 0351  CKTOTAL  --   --   --   --  <5*  TROPONINI 0.14* 0.26* 0.24* 0.18*  --    HbA1C: No results for input(s): HGBA1C in the last 72  hours. CBG: Recent Labs  Lab 01/02/18 2022 01/03/18 0928 01/03/18 1206 01/03/18 1346 01/03/18 1646  GLUCAP 196* 202* 176* 162* 128*    Recent Results (from the past 240 hour(s))  Blood Culture (routine x 2)     Status: Abnormal   Collection Time: 12/28/17 10:32 PM  Result Value Ref Range Status   Specimen Description BLOOD RIGHT ANTECUBITAL  Final   Special Requests   Final    BOTTLES DRAWN AEROBIC AND ANAEROBIC Blood Culture results may not be optimal due to an excessive volume of blood received in culture bottles   Culture  Setup Time   Final    GRAM POSITIVE COCCI IN BOTH AEROBIC AND ANAEROBIC BOTTLES CRITICAL RESULT CALLED TO, READ BACK BY AND VERIFIED WITH: Ferne Coe PharmD 10:45 12/29/17 ([email or web address]) Performed at [facility], [place] [address] [place], [place] [address]    Culture METHICILLIN RESISTANT STAPHYLOCOCCUS AUREUS (A)  Final   Report Status 12/31/2017 FINAL  Final   Organism ID, Bacteria METHICILLIN RESISTANT STAPHYLOCOCCUS AUREUS  Final      Susceptibility   Methicillin resistant staphylococcus aureus - MIC*    CIPROFLOXACIN <=0.5 SENSITIVE Sensitive     ERYTHROMYCIN >=8 RESISTANT Resistant     GENTAMICIN <=0.5 SENSITIVE Sensitive     OXACILLIN RESISTANT Resistant     TETRACYCLINE <=1 SENSITIVE Sensitive     VANCOMYCIN <=0.5 SENSITIVE Sensitive     TRIMETH/SULFA <=10 SENSITIVE Sensitive     CLINDAMYCIN >=8 RESISTANT Resistant     RIFAMPIN <=0.5 SENSITIVE Sensitive     Inducible Clindamycin NEGATIVE Sensitive     * METHICILLIN RESISTANT STAPHYLOCOCCUS AUREUS  Blood Culture ID Panel (Reflexed)  Status: Abnormal   Collection Time: 12/28/17 10:32 PM  Result Value Ref Range Status   Enterococcus species NOT DETECTED NOT DETECTED Final   Listeria monocytogenes NOT DETECTED NOT DETECTED Final   Staphylococcus species DETECTED (A) NOT DETECTED Final    Comment: CRITICAL RESULT CALLED TO, READ BACK BY AND VERIFIED WITH: Ferne Coe PharmD 10:45 12/29/17  ([email or web address])    Staphylococcus aureus (BCID) DETECTED (A) NOT DETECTED Final    Comment: Methicillin (oxacillin)-resistant Staphylococcus aureus (MRSA). MRSA is predictably resistant to beta-lactam antibiotics (except ceftaroline). Preferred therapy is vancomycin unless clinically contraindicated. Patient requires contact precautions if  hospitalized. CRITICAL RESULT CALLED TO, READ BACK BY AND VERIFIED WITH: Ferne Coe PharmD 10:45 12/29/17 ([email or web address])    Methicillin resistance DETECTED (A) NOT DETECTED Final    Comment: CRITICAL RESULT CALLED TO, READ BACK BY AND VERIFIED WITH: Ferne Coe PharmD 10:45 12/29/17 ([email or web address])    Streptococcus species NOT DETECTED NOT DETECTED Final   Streptococcus agalactiae NOT DETECTED NOT DETECTED Final   Streptococcus pneumoniae NOT DETECTED NOT DETECTED Final   Streptococcus pyogenes NOT DETECTED NOT DETECTED Final   Acinetobacter baumannii NOT DETECTED NOT DETECTED Final   Enterobacteriaceae species NOT DETECTED NOT DETECTED Final   Enterobacter cloacae complex NOT DETECTED NOT DETECTED Final   Escherichia coli NOT DETECTED NOT DETECTED Final   Klebsiella oxytoca NOT DETECTED NOT DETECTED Final   Klebsiella pneumoniae NOT DETECTED NOT DETECTED Final   Proteus species NOT DETECTED NOT DETECTED Final   Serratia marcescens NOT DETECTED NOT DETECTED Final   Haemophilus influenzae NOT DETECTED NOT DETECTED Final   Neisseria meningitidis NOT DETECTED NOT DETECTED Final   Pseudomonas aeruginosa NOT DETECTED NOT DETECTED Final   Candida albicans NOT DETECTED NOT DETECTED Final   Candida glabrata NOT DETECTED NOT DETECTED Final   Candida krusei NOT DETECTED NOT DETECTED Final   Candida parapsilosis NOT DETECTED NOT DETECTED Final   Candida tropicalis NOT DETECTED NOT DETECTED Final    Comment: Performed at [facility], [address], [place], [place] [address]  Blood Culture (routine x 2)     Status: Abnormal   Collection Time: 12/28/17 11:18 PM  Result  Value Ref Range Status   Specimen Description BLOOD RIGHT HAND  Final   Special Requests   Final    BOTTLES DRAWN AEROBIC AND ANAEROBIC Blood Culture results may not be optimal due to an inadequate volume of blood received in culture bottles   Culture  Setup Time   Final    GRAM POSITIVE COCCI IN CLUSTERS IN BOTH AEROBIC AND ANAEROBIC BOTTLES CRITICAL RESULT CALLED TO, READ BACK BY AND VERIFIED WITH: Ferne Coe PharmD 10:45 12/29/17 ([email or web address])    Culture (A)  Final    STAPHYLOCOCCUS AUREUS SUSCEPTIBILITIES PERFORMED ON PREVIOUS CULTURE WITHIN THE LAST 5 DAYS. Performed at [facility], [place] [address] [place], [place] [address]    Report Status 12/31/2017 FINAL  Final  Urine culture     Status: Abnormal   Collection Time: 12/29/17 12:03 AM  Result Value Ref Range Status   Specimen Description URINE, RANDOM  Final   Special Requests   Final    Immunocompromised Performed at [facility], [place] [address], [place], [place] [address]    Culture (A)  Final    20,000 COLONIES/mL ENTEROCOCCUS FAECALIS 10,000 COLONIES/mL STAPHYLOCOCCUS AUREUS METHICILLIN RESISTANT STAPHYLOCOCCUS AUREUS    Report Status 01/01/2018 FINAL  Final   Organism ID, Bacteria ENTEROCOCCUS FAECALIS (A)  Final   Organism  ID, Bacteria STAPHYLOCOCCUS AUREUS (A)  Final      Susceptibility   Enterococcus faecalis - MIC*    AMPICILLIN <=2 SENSITIVE Sensitive     LEVOFLOXACIN 1 SENSITIVE Sensitive     NITROFURANTOIN <=16 SENSITIVE Sensitive     VANCOMYCIN 2 SENSITIVE Sensitive     * 20,000 COLONIES/mL ENTEROCOCCUS FAECALIS   Staphylococcus aureus - MIC*    CIPROFLOXACIN <=0.5 SENSITIVE Sensitive     GENTAMICIN <=0.5 SENSITIVE Sensitive     NITROFURANTOIN <=16 SENSITIVE Sensitive     OXACILLIN >=4 RESISTANT Resistant     TETRACYCLINE <=1 SENSITIVE Sensitive     VANCOMYCIN 1 SENSITIVE Sensitive     TRIMETH/SULFA <=10 SENSITIVE Sensitive     CLINDAMYCIN >=8 RESISTANT Resistant     RIFAMPIN <=0.5 SENSITIVE  Sensitive     Inducible Clindamycin NEGATIVE Sensitive     * 10,000 COLONIES/mL STAPHYLOCOCCUS AUREUS  Culture, blood (routine x 2)     Status: None (Preliminary result)   Collection Time: 12/30/17  8:12 AM  Result Value Ref Range Status   Specimen Description BLOOD LEFT ANTECUBITAL  Final   Special Requests   Final    BOTTLES DRAWN AEROBIC ONLY Blood Culture adequate volume   Culture   Final    NO GROWTH 4 DAYS Performed at [facility], [address], [place], [place] [address]    Report Status PENDING  Incomplete  Culture, blood (routine x 2)     Status: None (Preliminary result)   Collection Time: 12/30/17  8:15 AM  Result Value Ref Range Status   Specimen Description BLOOD BLOOD LEFT HAND  Final   Special Requests   Final    BOTTLES DRAWN AEROBIC ONLY Blood Culture adequate volume   Culture   Final    NO GROWTH 4 DAYS Performed at [facility], [place] [address], [place], [place] [address]    Report Status PENDING  Incomplete         Radiology Studies: Ct Angio Ao+bifem W & Or Wo Contrast  Result Date: 01/03/2018 CLINICAL DATA:  Mottled appearance of lower extremities. Bacteremia. EXAM: CT ANGIOGRAPHY OF ABDOMINAL AORTA WITH ILIOFEMORAL RUNOFF TECHNIQUE: Multidetector CT imaging of the abdomen, pelvis and lower extremities was performed using the standard protocol during bolus administration of intravenous contrast. Multiplanar CT image reconstructions and MIPs were obtained to evaluate the vascular anatomy. CONTRAST:  123m ISOVUE-370 IOPAMIDOL (ISOVUE-370) INJECTION 76% COMPARISON:  CT abdomen and pelvis 12/29/2017 FINDINGS: VASCULAR Aorta: Atherosclerotic calcifications. No aneurysm, dissection or stenosis. Celiac: Calcified plaque at the origin, widely patent SMA: Calcified plaque at the origin, widely patent Renals: Probable significant narrowing at the origin of the right renal artery. Left renal artery widely patent IMA: Widely patent RIGHT Lower Extremity  Inflow: Calcified iliac vessels. No aneurysm, dissection or focal stenosis. Outflow: Common femoral artery, superficial femoral artery and profundus femoris widely patent. Runoff: Calcifications in the popliteal artery and tibioperoneal trunk without significant focal stenosis. Trifurcation vessels are patent into the distal calf where they slowly become unopacified, likely related to timing. LEFT Lower Extremity Inflow: Calcifications in the iliac vessels, widely patent. No aneurysm or dissection. Outflow: Widely patent common femoral artery, superficial femoral artery and profundus femoris. Calcified plaque in the distal superficial femoral artery. Runoff: Calcified plaque without significant stenosis in the popliteal artery. Occlusion of the tibioperoneal trunk approximately. Anterior tibial artery remains patent to the ankle. There appears to be reconstitution of the posterior tibial artery in the distal calf. Veins:  Grossly unremarkable. Review of the MIP images confirms the above findings. NON-VASCULAR Lower chest: Cardiomegaly. Diffuse coronary artery calcifications. Prior median sternotomy, presumably CABG. Small bilateral pleural effusions with bibasilar atelectasis. Hepatobiliary: No focal hepatic abnormality. Gallbladder unremarkable. Pancreas: No focal abnormality or ductal dilatation. Spleen: No focal abnormality.  Normal size. Adrenals/Urinary Tract: Small bilateral renal cysts. No hydronephrosis. Adrenal glands and urinary bladder unremarkable. Stomach/Bowel: Stomach, large and small bowel grossly unremarkable. Lymphatic: No adenopathy Reproductive: Central prostate calcifications. Other: No free fluid or free air. Musculoskeletal: No acute bony abnormality. IMPRESSION: VASCULAR Atherosclerotic calcifications in the aorta and iliac vessels without stenosis, aneurysm or dissection. Right lower extremity vessels are patent without significant stenosis. Occlusion of the left tibioperoneal trunk. Dominant  runoff in the left calf is the anterior tibial artery. There appears to be reconstitution of the posterior tibial artery in the distal calf. NON-VASCULAR Small bilateral pleural effusions.  Bibasilar atelectasis. No acute findings in the abdomen or pelvis. Electronically Signed   By: Rolm Baptise M.D.   On: 01/03/2018 01:29        Scheduled Meds: . folic acid  1 mg Oral Daily  . insulin aspart  0-5 Units Subcutaneous QHS  . insulin aspart  0-9 Units Subcutaneous TID WC  . metoprolol tartrate  37.5 mg Oral BID  . multivitamin with minerals  1 tablet Oral Daily  . simvastatin  20 mg Oral QHS  . tamsulosin  0.4 mg Oral Daily  . thiamine injection  100 mg Intravenous Daily  . vitamin B-12  1,000 mcg Oral Daily   Continuous Infusions: . sodium chloride    . rifampin (RIFADIN) IVPB 200 mL/hr at 01/03/18 1000  . vancomycin Stopped (01/03/18 0617)     LOS: 5 days     Vernell Leep, MD, FACP, [facility]. [facility] Pager [phone]  If 7PM-7AM, please contact night-coverage [email or web address] Password [email or web address] 01/03/2018, 5:30 PM

## 2018-01-03 NOTE — Progress Notes (Signed)
    [facility] has been requested to perform a transesophageal echocardiogram on 01/04/18 for bacteremia.  After careful review of history and examination, the risks and benefits of transesophageal echocardiogram have been explained including risks of esophageal damage, perforation (1:10,000 risk), bleeding, pharyngeal hematoma as well as other potential complications associated with conscious sedation including aspiration, arrhythmia, respiratory failure and death. Alternatives to treatment were discussed, questions were answered. Patient is willing to proceed.   TEE scheduled for 01/04/18 at 11am with Dr. Cristal Deer.  Judy Pimple, PA-C 01/03/2018 2:51 PM

## 2018-01-04 ENCOUNTER — Inpatient Hospital Stay (HOSPITAL_COMMUNITY): Payer: Medicare Other

## 2018-01-04 ENCOUNTER — Encounter (HOSPITAL_COMMUNITY): Payer: Self-pay | Admitting: *Deleted

## 2018-01-04 ENCOUNTER — Encounter (HOSPITAL_COMMUNITY)
Admission: EM | Disposition: A | Discharge status: Hospice | Payer: Self-pay | Source: Home / Self Care | Attending: Internal Medicine

## 2018-01-04 DIAGNOSIS — I358 Other nonrheumatic aortic valve disorders: Secondary | ICD-10-CM

## 2018-01-04 DIAGNOSIS — I351 Nonrheumatic aortic (valve) insufficiency: Secondary | ICD-10-CM

## 2018-01-04 DIAGNOSIS — R7881 Bacteremia: Secondary | ICD-10-CM

## 2018-01-04 DIAGNOSIS — I330 Acute and subacute infective endocarditis: Secondary | ICD-10-CM

## 2018-01-04 DIAGNOSIS — I76 Septic arterial embolism: Secondary | ICD-10-CM

## 2018-01-04 DIAGNOSIS — I6340 Cerebral infarction due to embolism of unspecified cerebral artery: Secondary | ICD-10-CM

## 2018-01-04 HISTORY — PX: TEE WITHOUT CARDIOVERSION: SHX5443

## 2018-01-04 LAB — COMPREHENSIVE METABOLIC PANEL
ALBUMIN: 1.8 g/dL — AB (ref 3.5–5.0)
ALT: 14 U/L (ref 0–44)
AST: 24 U/L (ref 15–41)
Alkaline Phosphatase: 65 U/L (ref 38–126)
Anion gap: 9 (ref 5–15)
BUN: 22 mg/dL (ref 8–23)
CHLORIDE: 116 mmol/L — AB (ref 98–111)
CO2: 16 mmol/L — AB (ref 22–32)
CREATININE: 1.25 mg/dL — AB (ref 0.61–1.24)
Calcium: 7.5 mg/dL — ABNORMAL LOW (ref 8.9–10.3)
GFR calc Af Amer: >60 mL/min (ref >60)
GFR calc non Af Amer: 55 mL/min — ABNORMAL LOW (ref >60)
GLUCOSE: 151 mg/dL — AB (ref 70–99)
POTASSIUM: 4.3 mmol/L (ref 3.5–5.1)
SODIUM: 141 mmol/L (ref 135–145)
Total Bilirubin: 2.3 mg/dL — ABNORMAL HIGH (ref 0.3–1.2)
Total Protein: 5 g/dL — ABNORMAL LOW (ref 6.5–8.1)

## 2018-01-04 LAB — CULTURE, BLOOD (ROUTINE X 2)
Culture: NO GROWTH
Culture: NO GROWTH
Special Requests: ADEQUATE
Special Requests: ADEQUATE

## 2018-01-04 LAB — CBC
HCT: 32.7 % — ABNORMAL LOW (ref 39.0–52.0)
Hemoglobin: 10.1 g/dL — ABNORMAL LOW (ref 13.0–17.0)
MCH: 26.7 pg (ref 26.0–34.0)
MCHC: 30.9 g/dL (ref 30.0–36.0)
MCV: 86.5 fL (ref 80.0–100.0)
PLATELETS: 65 10*3/uL — AB (ref 150–400)
RBC: 3.78 MIL/uL — AB (ref 4.22–5.81)
RDW: 17.4 % — AB (ref 11.5–15.5)
WBC: 13.7 10*3/uL — AB (ref 4.0–10.5)
nRBC: 0 % (ref 0.0–0.2)

## 2018-01-04 LAB — GLUCOSE, CAPILLARY
GLUCOSE-CAPILLARY: 134 mg/dL — AB (ref 70–99)
GLUCOSE-CAPILLARY: 140 mg/dL — AB (ref 70–99)
Glucose-Capillary: 152 mg/dL — ABNORMAL HIGH (ref 70–99)
Glucose-Capillary: 116 mg/dL — ABNORMAL HIGH (ref 70–99)

## 2018-01-04 LAB — VANCOMYCIN, TROUGH: Vancomycin Tr: 20 ug/mL (ref 15–20)

## 2018-01-04 LAB — RPR: RPR Ser Ql: NONREACTIVE

## 2018-01-04 SURGERY — ECHOCARDIOGRAM, TRANSESOPHAGEAL
Anesthesia: Moderate Sedation

## 2018-01-04 MED ORDER — MIDAZOLAM HCL 10 MG/2ML IJ SOLN
INTRAMUSCULAR | Status: DC | PRN
  Administered 2018-01-04: 2 mg via INTRAVENOUS
  Administered 2018-01-04: 1 mg via INTRAVENOUS

## 2018-01-04 MED ORDER — IOPAMIDOL (ISOVUE-370) INJECTION 76%
100.0000 mL | Freq: Once | INTRAVENOUS | Status: AC | PRN
  Administered 2018-01-04: 50 mL via INTRAVENOUS
  Administered 2018-01-04: 100 mL via INTRAVENOUS

## 2018-01-04 MED ORDER — MIDAZOLAM HCL 5 MG/ML IJ SOLN
INTRAMUSCULAR | Status: AC
  Filled 2018-01-04: qty 2

## 2018-01-04 MED ORDER — VANCOMYCIN HCL IN DEXTROSE 750-5 MG/150ML-% IV SOLN
750.0000 mg | Freq: Two times a day (BID) | INTRAVENOUS | Status: DC
  Administered 2018-01-04 – 2018-01-05 (×2): 750 mg via INTRAVENOUS
  Filled 2018-01-04 (×3): qty 150

## 2018-01-04 MED ORDER — FENTANYL CITRATE (PF) 100 MCG/2ML IJ SOLN
INTRAMUSCULAR | Status: AC
  Filled 2018-01-04: qty 2

## 2018-01-04 MED ORDER — FENTANYL CITRATE (PF) 100 MCG/2ML IJ SOLN
INTRAMUSCULAR | Status: DC | PRN
  Administered 2018-01-04 (×2): 25 ug via INTRAVENOUS

## 2018-01-04 MED ORDER — IOPAMIDOL (ISOVUE-370) INJECTION 76%
INTRAVENOUS | Status: AC
  Administered 2018-01-04: 100 mL via INTRAVENOUS
  Filled 2018-01-04: qty 50

## 2018-01-04 MED ORDER — BUTAMBEN-TETRACAINE-BENZOCAINE 2-2-14 % EX AERO
INHALATION_SPRAY | CUTANEOUS | Status: DC | PRN
  Administered 2018-01-04: 2 via TOPICAL

## 2018-01-04 NOTE — Progress Notes (Addendum)
PROGRESS NOTE        PATIENT DETAILS Name: Peter Weiss Age: 77 y.o. Sex: male Date of Birth: 1940/11/03 Admit Date: 12/28/2017 Admitting Physician Lorretta Harp, MD ZOX:[id number], Dorma Russell, MD  Brief Narrative: Patient is a 77 y.o. male with history of bioprosthetic AVR, chronic diastolic heart failure, A. fib not on anticoagulation, dementia presented with sepsis along with acute metabolic encephalopathy-secondary to MRSA bacteremia with septic emboli to the brain.  Work-up in progress-TEE scheduled for 10/23.  See below for further details.  Subjective: Appears very lethargic-but awake-follows commands.  Moves all 4 extremities but appears very weak.  Assessment/Plan: Sepsis secondary to MRSA bacteremia with probable septic emboli to the brain: Sepsis pathophysiology has improved, continue IV vancomycin and rifampin (bioprosthetic aortic valve).  TTE with no obvious vegetations, TEE scheduled for 10/23-although with septic emboli high suspicion for endocarditis even if TEE negative.  Repeat blood cultures on 10/18- so far.  ID following.  Addendum: TEE shows a possible perivalvular abscess-spoke with Dr. Gus Height that we get in touch with cardiothoracic surgery-have left a message for cardiothoracic surgery will await callback.  Check twelve-lead EKG daily   Acute metabolic encephalopathy: Likely secondary to above-continues to have waxing and waning confusion.  Does have dementia per outpatient notes.  MRI brain on 10/22 confirms numerous small ischemic foci to the right cerebellum and right occipital lobe-likely reflecting septic emboli.  EEG without any seizures.  Vitamin B12/ammonia levels within normal limits. Continue supportive care.  Acute embolic CVA: Likely septic emboli to the brain in the setting of MRSA bacteremia-this was seen on MRI brain on 10/22.  Neurology consulted.  PAF with RVR: Rate controlled-continue metoprolol-not a candidate for  anticoagulation per prior notes due to history of GI bleed.  Chads2vasc score is 5.  Elevated troponin: Likely secondary to demand ischemia-no further recommendations from cardiology.  History of bioprosthetic aortic valve replacement: See above regarding concerns for prosthetic valve endocarditis due to MRSA bacteremia and possible septic emboli to the brain  Hypertension: Controlled this morning-continue metoprolol.  Prolonged UJW:JXBJ have a right bundle branch-so not sure if QTC is accurate in this setting.  Monitor periodically.  DM-2: CBGs stable with SSI.  Dyslipidemia: Continue Zocor.  Bilateral leg mottling: CTA without any major stenosis-thought to be secondary to sepsis-no further recommendations from vascular surgery.  Mottling continues but appears mild on my exam.  Anemia: Likely secondary to acute illness-hemoglobin is stable-follow  Thrombocytopenia: Probably related to sepsis-follow.  Dysphagia: Followed by speech therapy-on dysphagia 1 diet.  BPH: Continue Flomax  Stable mass within the pituitary fossa: Noted on CT head 10/17.  However MRI and brain showed partially empty sella-no further work-up required.  Dementia  Severe debility/deconditionin/failure to thrive syndrome: Likely secondary to acute illness-PT following-however albumin is 1.8!  Poor prognosis.  Will get palliative care to follow as well.  DVT Prophylaxis: SCD's  Code Status: Full code  Family Communication: None at bedside  Addendum: Managed to get hold of patient's son Peter Weiss at [phone] does live in [place]-but is in town till this coming Sunday-Long discussion regarding tenuous clinical situation in regards to his father-he is agreeable to participate in his father's treatment plan/healthcare decision making.  Will involve palliative care-for goals of care meeting-as patient's spouse is demented and is in the process of moving into a nursing home, both of his sons  live outside  the [place] (but are currently in town)  Disposition Plan: Remain inpatient-requires several more days of hospitalization-SNF on discharge.  Antimicrobial agents: Anti-infectives (From admission, onward)   Start     Dose/Rate Route Frequency Ordered Stop   01/02/18 0600  vancomycin (VANCOCIN) IVPB 1000 mg/200 mL premix     1,000 mg 200 mL/hr over 60 Minutes Intravenous Every 12 hours 01/01/18 1833     12/30/17 2200  rifampin (RIFADIN) 300 mg in sodium chloride 0.9 % 100 mL IVPB     300 mg 200 mL/hr over 30 Minutes Intravenous Every 12 hours 12/30/17 1709     12/29/17 1800  vancomycin (VANCOCIN) 1,250 mg in sodium chloride 0.9 % 250 mL IVPB     1,250 mg 166.7 mL/hr over 90 Minutes Intravenous Every 24 hours 12/29/17 1115 01/01/18 2001   12/29/17 0300  piperacillin-tazobactam (ZOSYN) IVPB 3.375 g  Status:  Discontinued     3.375 g 12.5 mL/hr over 240 Minutes Intravenous Every 8 hours 12/29/17 0233 12/29/17 1653   12/29/17 0145  metroNIDAZOLE (FLAGYL) IVPB 500 mg  Status:  Discontinued     500 mg 100 mL/hr over 60 Minutes Intravenous  Once 12/29/17 0135 12/29/17 0224   12/28/17 2300  ceFEPIme (MAXIPIME) 2 g in sodium chloride 0.9 % 100 mL IVPB     2 g 200 mL/hr over 30 Minutes Intravenous STAT 12/28/17 2257 12/29/17 0010   12/28/17 2300  vancomycin (VANCOCIN) 1,500 mg in sodium chloride 0.9 % 500 mL IVPB     1,500 mg 250 mL/hr over 120 Minutes Intravenous STAT 12/28/17 2258 12/29/17 0308      Procedures: 10/18>>Echo: - Very technically difficult study. Compared to a prior study in   2016, there are no significant changes. The bioprosthetic aortic   valve gradient is stable - no obvious large vegetation was noted   on the valve, however, visualization of the valve is limited.   Suggest TEE if clinical concern for bioprosthetic valve   endocarditis is high.  CONSULTS: vvs Cards [facility] ID Neuro  Time spent: 25 minutes-Greater than 50% of this time was spent in counseling,  explanation of diagnosis, planning of further management, and coordination of care.  MEDICATIONS: Scheduled Meds: . folic acid  1 mg Oral Daily  . insulin aspart  0-5 Units Subcutaneous QHS  . insulin aspart  0-9 Units Subcutaneous TID WC  . metoprolol tartrate  37.5 mg Oral BID  . multivitamin with minerals  1 tablet Oral Daily  . simvastatin  20 mg Oral QHS  . tamsulosin  0.4 mg Oral Daily  . thiamine injection  100 mg Intravenous Daily  . vitamin B-12  1,000 mcg Oral Daily   Continuous Infusions: . sodium chloride 20 mL/hr at 01/03/18 1743  . rifampin (RIFADIN) IVPB 300 mg (01/04/18 1012)  . vancomycin 1,000 mg (01/04/18 0554)   PRN Meds:.acetaminophen **OR** acetaminophen, hydrALAZINE, ondansetron **OR** ondansetron (ZOFRAN) IV   PHYSICAL EXAM: Vital signs: Vitals:   01/03/18 1200 01/03/18 1641 01/03/18 1647 01/04/18 1000  BP:    (!) 108/46  Pulse: 94   94  Resp: (!) 31   (!) 34  Temp:   (!) 100.4 F (38 C) (!) 101 F (38.3 C)  TempSrc:   Axillary Axillary  SpO2:  98%  97%  Weight:      Height:       Filed Weights   12/28/17 2225 12/29/17 1300  Weight: 114.6 kg 115 kg   Body mass index  is 33.45 kg/m.   General appearance :Awake-lethargic but able to answer most of my questions appropriately. HEENT: Atraumatic and Normocephalic Neck: supple Resp:Good air entry bilaterally, no added sounds  CVS: S1 S2 regular-syst murmur GI: Bowel sounds present, Non tender and not distended with no gaurding, rigidity or rebound. Extremities: B/L Lower Ext shows trace edema, both legs are warm to touch Neurology: Generalized weakness-but appears to be nonfocal Musculoskeletal:No digital cyanosis Skin:No Rash, warm and dry Wounds:N/A  I have personally reviewed following labs and imaging studies  LABORATORY DATA: CBC: Recent Labs  Lab 12/28/17 2232  12/30/17 0812 12/31/17 1205 01/01/18 0347 01/02/18 0531 01/04/18 0533  WBC 14.0*   < > 8.6 11.1* 13.6* 12.5* 13.7*    NEUTROABS 13.2*  --   --   --   --   --   --   HGB 10.2*   < > 7.8* 8.8* 8.8* 8.9* 10.1*  HCT 33.0*   < > 25.5* 28.8* 29.7* 29.8* 32.7*  MCV 88.9   < > 89.5 89.7 88.4 88.4 86.5  PLT 166   < > 78* 91* 94* 89* 65*   < > = values in this interval not displayed.    Basic Metabolic Panel: Recent Labs  Lab 12/28/17 2318  12/29/17 0753  12/31/17 0607  01/01/18 0347 01/01/18 1710 01/02/18 0531 01/03/18 0351 01/04/18 0533  NA  --    < > 136   < > 147*   < > 147* 144 145 140 141  K  --    < > 3.5   < > 4.4   < > 3.8 2.9* 3.2* 4.2 4.3  CL  --    < > 107   < > 124*   < > 122* 123* 120* 116* 116*  CO2  --    < > 17*   < > 13*   < > 18* 20* 19* 20* 16*  GLUCOSE  --    < > 255*   < > 180*   < > 223* 220* 195* 212* 151*  BUN  --    < > 23   < > 39*   < > 38* 32* 26* 23 22  CREATININE  --    < > 1.48*   < > 1.39*   < > 1.21 1.14 1.06 1.22 1.25*  CALCIUM  --    < > 7.5*   < > 7.7*   < > 8.0* 7.8* 7.8* 7.4* 7.5*  MG 1.2*  --  1.6*  --  2.2  --   --   --   --   --   --    < > = values in this interval not displayed.    GFR: Estimated Creatinine Clearance: 67.8 mL/min (A) (by C-G formula based on SCr of 1.25 mg/dL (H)).  Liver Function Tests: Recent Labs  Lab 12/29/17 0753 12/30/17 0812 12/31/17 0607 01/02/18 0531 01/04/18 0533  AST 34 26 45* 20 24  ALT 15 15 23 18 14   ALKPHOS 35* 34* 71 70 65  BILITOT 1.9* 1.0 2.9* 2.8* 2.3*  PROT 6.1* 5.3* 5.7* 5.3* 5.0*  ALBUMIN 2.7* 2.3* 2.4* 2.0* 1.8*   No results for input(s): LIPASE, AMYLASE in the last 168 hours. Recent Labs  Lab 12/29/17 1559 01/02/18 0531  AMMONIA 14 18    Coagulation Profile: Recent Labs  Lab 12/28/17 2318  INR 1.59    Cardiac Enzymes: Recent Labs  Lab 12/28/17 2318 12/29/17 0254 12/29/17 0753  12/29/17 1559 01/03/18 0351  CKTOTAL  --   --   --   --  <5*  TROPONINI 0.14* 0.26* 0.24* 0.18*  --     BNP (last 3 results) No results for input(s): PROBNP in the last 8760 hours.  HbA1C: No results for  input(s): HGBA1C in the last 72 hours.  CBG: Recent Labs  Lab 01/03/18 1206 01/03/18 1346 01/03/18 1646 01/03/18 2113 01/04/18 0746  GLUCAP 176* 162* 128* 160* 152*    Lipid Profile: No results for input(s): CHOL, HDL, LDLCALC, TRIG, CHOLHDL, LDLDIRECT in the last 72 hours.  Thyroid Function Tests: Recent Labs    01/03/18 1910  TSH 1.536    Anemia Panel: Recent Labs    01/03/18 1910  VITAMINB12 2,945*    Urine analysis:    Component Value Date/Time   COLORURINE AMBER (A) 12/29/2017 0003   APPEARANCEUR CLOUDY (A) 12/29/2017 0003   APPEARANCEUR Clear 03/19/2014 1200   LABSPEC 1.021 12/29/2017 0003   LABSPEC 1.009 03/19/2014 1200   PHURINE 5.0 12/29/2017 0003   GLUCOSEU 50 (A) 12/29/2017 0003   GLUCOSEU Negative 03/19/2014 1200   HGBUR LARGE (A) 12/29/2017 0003   BILIRUBINUR NEGATIVE 12/29/2017 0003   BILIRUBINUR Negative 03/19/2014 1200   KETONESUR 20 (A) 12/29/2017 0003   PROTEINUR 100 (A) 12/29/2017 0003   UROBILINOGEN 0.2 07/04/2014 1003   NITRITE NEGATIVE 12/29/2017 0003   LEUKOCYTESUR NEGATIVE 12/29/2017 0003   LEUKOCYTESUR Negative 03/19/2014 1200    Sepsis Labs: Lactic Acid, Venous    Component Value Date/Time   LATICACIDVEN 1.4 12/29/2017 2109    MICROBIOLOGY: Recent Results (from the past 240 hour(s))  Blood Culture (routine x 2)     Status: Abnormal   Collection Time: 12/28/17 10:32 PM  Result Value Ref Range Status   Specimen Description BLOOD RIGHT ANTECUBITAL  Final   Special Requests   Final    BOTTLES DRAWN AEROBIC AND ANAEROBIC Blood Culture results may not be optimal due to an excessive volume of blood received in culture bottles   Culture  Setup Time   Final    GRAM POSITIVE COCCI IN BOTH AEROBIC AND ANAEROBIC BOTTLES CRITICAL RESULT CALLED TO, READ BACK BY AND VERIFIED WITH: Antoine Primas PharmD 10:45 12/29/17 ([email or web address]) Performed at [facility], [address], [place], [place] [address]    Culture METHICILLIN RESISTANT  STAPHYLOCOCCUS AUREUS (A)  Final   Report Status 12/31/2017 FINAL  Final   Organism ID, Bacteria METHICILLIN RESISTANT STAPHYLOCOCCUS AUREUS  Final      Susceptibility   Methicillin resistant staphylococcus aureus - MIC*    CIPROFLOXACIN <=0.5 SENSITIVE Sensitive     ERYTHROMYCIN >=8 RESISTANT Resistant     GENTAMICIN <=0.5 SENSITIVE Sensitive     OXACILLIN RESISTANT Resistant     TETRACYCLINE <=1 SENSITIVE Sensitive     VANCOMYCIN <=0.5 SENSITIVE Sensitive     TRIMETH/SULFA <=10 SENSITIVE Sensitive     CLINDAMYCIN >=8 RESISTANT Resistant     RIFAMPIN <=0.5 SENSITIVE Sensitive     Inducible Clindamycin NEGATIVE Sensitive     * METHICILLIN RESISTANT STAPHYLOCOCCUS AUREUS  Blood Culture ID Panel (Reflexed)     Status: Abnormal   Collection Time: 12/28/17 10:32 PM  Result Value Ref Range Status   Enterococcus species NOT DETECTED NOT DETECTED Final   Listeria monocytogenes NOT DETECTED NOT DETECTED Final   Staphylococcus species DETECTED (A) NOT DETECTED Final    Comment: CRITICAL RESULT CALLED TO, READ BACK BY AND VERIFIED WITH: Antoine Primas PharmD 10:45 12/29/17 (  wilsonm)    Staphylococcus aureus (BCID) DETECTED (A) NOT DETECTED Final    Comment: Methicillin (oxacillin)-resistant Staphylococcus aureus (MRSA). MRSA is predictably resistant to beta-lactam antibiotics (except ceftaroline). Preferred therapy is vancomycin unless clinically contraindicated. Patient requires contact precautions if  hospitalized. CRITICAL RESULT CALLED TO, READ BACK BY AND VERIFIED WITH: Antoine Primas PharmD 10:45 12/29/17 ([email or web address])    Methicillin resistance DETECTED (A) NOT DETECTED Final    Comment: CRITICAL RESULT CALLED TO, READ BACK BY AND VERIFIED WITH: Antoine Primas PharmD 10:45 12/29/17 ([email or web address])    Streptococcus species NOT DETECTED NOT DETECTED Final   Streptococcus agalactiae NOT DETECTED NOT DETECTED Final   Streptococcus pneumoniae NOT DETECTED NOT DETECTED Final   Streptococcus pyogenes NOT DETECTED  NOT DETECTED Final   Acinetobacter baumannii NOT DETECTED NOT DETECTED Final   Enterobacteriaceae species NOT DETECTED NOT DETECTED Final   Enterobacter cloacae complex NOT DETECTED NOT DETECTED Final   Escherichia coli NOT DETECTED NOT DETECTED Final   Klebsiella oxytoca NOT DETECTED NOT DETECTED Final   Klebsiella pneumoniae NOT DETECTED NOT DETECTED Final   Proteus species NOT DETECTED NOT DETECTED Final   Serratia marcescens NOT DETECTED NOT DETECTED Final   Haemophilus influenzae NOT DETECTED NOT DETECTED Final   Neisseria meningitidis NOT DETECTED NOT DETECTED Final   Pseudomonas aeruginosa NOT DETECTED NOT DETECTED Final   Candida albicans NOT DETECTED NOT DETECTED Final   Candida glabrata NOT DETECTED NOT DETECTED Final   Candida krusei NOT DETECTED NOT DETECTED Final   Candida parapsilosis NOT DETECTED NOT DETECTED Final   Candida tropicalis NOT DETECTED NOT DETECTED Final    Comment: Performed at [facility], [address] [place], [place] [address]  Blood Culture (routine x 2)     Status: Abnormal   Collection Time: 12/28/17 11:18 PM  Result Value Ref Range Status   Specimen Description BLOOD RIGHT HAND  Final   Special Requests   Final    BOTTLES DRAWN AEROBIC AND ANAEROBIC Blood Culture results may not be optimal due to an inadequate volume of blood received in culture bottles   Culture  Setup Time   Final    GRAM POSITIVE COCCI IN CLUSTERS IN BOTH AEROBIC AND ANAEROBIC BOTTLES CRITICAL RESULT CALLED TO, READ BACK BY AND VERIFIED WITH: Antoine Primas PharmD 10:45 12/29/17 ([email or web address])    Culture (A)  Final    STAPHYLOCOCCUS AUREUS SUSCEPTIBILITIES PERFORMED ON PREVIOUS CULTURE WITHIN THE LAST 5 DAYS. Performed at [facility], [address] [place], [place] [address]    Report Status 12/31/2017 FINAL  Final  Urine culture     Status: Abnormal   Collection Time: 12/29/17 12:03 AM  Result Value Ref Range Status   Specimen Description URINE, RANDOM  Final    Special Requests   Final    Immunocompromised Performed at [facility], [address] [place], [place] [address]    Culture (A)  Final    20,000 COLONIES/mL ENTEROCOCCUS FAECALIS 10,000 COLONIES/mL STAPHYLOCOCCUS AUREUS METHICILLIN RESISTANT STAPHYLOCOCCUS AUREUS    Report Status 01/01/2018 FINAL  Final   Organism ID, Bacteria ENTEROCOCCUS FAECALIS (A)  Final   Organism ID, Bacteria STAPHYLOCOCCUS AUREUS (A)  Final      Susceptibility   Enterococcus faecalis - MIC*    AMPICILLIN <=2 SENSITIVE Sensitive     LEVOFLOXACIN 1 SENSITIVE Sensitive     NITROFURANTOIN <=16 SENSITIVE Sensitive     VANCOMYCIN 2 SENSITIVE Sensitive     * 20,000 COLONIES/mL ENTEROCOCCUS FAECALIS  Staphylococcus aureus - MIC*    CIPROFLOXACIN <=0.5 SENSITIVE Sensitive     GENTAMICIN <=0.5 SENSITIVE Sensitive     NITROFURANTOIN <=16 SENSITIVE Sensitive     OXACILLIN >=4 RESISTANT Resistant     TETRACYCLINE <=1 SENSITIVE Sensitive     VANCOMYCIN 1 SENSITIVE Sensitive     TRIMETH/SULFA <=10 SENSITIVE Sensitive     CLINDAMYCIN >=8 RESISTANT Resistant     RIFAMPIN <=0.5 SENSITIVE Sensitive     Inducible Clindamycin NEGATIVE Sensitive     * 10,000 COLONIES/mL STAPHYLOCOCCUS AUREUS  Culture, blood (routine x 2)     Status: None   Collection Time: 12/30/17  8:12 AM  Result Value Ref Range Status   Specimen Description BLOOD LEFT ANTECUBITAL  Final   Special Requests   Final    BOTTLES DRAWN AEROBIC ONLY Blood Culture adequate volume   Culture   Final    NO GROWTH 5 DAYS Performed at [facility], [address] [place], [place] [address]    Report Status 01/04/2018 FINAL  Final  Culture, blood (routine x 2)     Status: None   Collection Time: 12/30/17  8:15 AM  Result Value Ref Range Status   Specimen Description BLOOD BLOOD LEFT HAND  Final   Special Requests   Final    BOTTLES DRAWN AEROBIC ONLY Blood Culture adequate volume   Culture   Final    NO GROWTH 5 DAYS Performed at [facility], [address], [place], [place] [address]    Report Status 01/04/2018 FINAL  Final    RADIOLOGY STUDIES/RESULTS: Dg Chest 2 View  Result Date: 12/28/2017 CLINICAL DATA:  Altered mental status EXAM: CHEST - 2 VIEW COMPARISON:  07/06/2017 FINDINGS: Post sternotomy changes and valve prosthesis. Low lung volumes. Mild cardiomegaly. Aortic atherosclerosis. No pneumothorax. IMPRESSION: No active cardiopulmonary disease.  Low lung volumes. Electronically Signed   By: Jasmine Pang M.D.   On: 12/28/2017 23:18   Ct Head Wo Contrast  Result Date: 12/29/2017 CLINICAL DATA:  77 y/o M; found in bed covered urine. Altered level of consciousness, unexplained. EXAM: CT HEAD WITHOUT CONTRAST TECHNIQUE: Contiguous axial images were obtained from the base of the skull through the vertex without intravenous contrast. COMPARISON:  06/04/2017 CT head. FINDINGS: Brain: No evidence of acute infarction, hemorrhage, hydrocephalus, extra-axial collection or mass lesion/mass effect of brain parenchyma. 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 The pituitary mass abuts the optic chiasm and pre chiasmatic optic nerves without significant displacement. Stable small chronic infarction within the left frontal operculum, chronic microvascular ischemic changes of the brain, and volume loss. Vascular: Foci of air present throughout the venous structures of the face and the cavernous sinus, likely after Janik. Calcific atherosclerosis of carotid siphons. No hyperdense vessel identified. Skull: Normal. Negative for fracture or focal lesion. Sinuses/Orbits: Mild mucosal thickening of the maxillary sinuses and the frontal sinuses. Small maxillary sinus mucous retention cyst. Normal aeration of the mastoid air cells. Other: None. IMPRESSION: 1. No acute intracranial abnormality identified. 2. Stable mass within the pituitary fossa with mild mass effect on the optic chiasm and pre chiasmatic  optic nerves. 3. Stable small chronic infarction in left frontal operculum, chronic microvascular ischemic changes of the brain, and volume loss. Electronically Signed   By: Mitzi Hansen M.D.   On: 12/29/2017 03:58   Mr Brain Wo Contrast  Result Date: 01/04/2018 CLINICAL DATA:  Altered mental status EXAM: MRI HEAD WITHOUT CONTRAST TECHNIQUE:  Multiplanar, multiecho pulse sequences of the brain and surrounding structures were obtained without intravenous contrast. COMPARISON:  Head CT 12/29/2017 FINDINGS: BRAIN: There are multiple punctate foci of abnormal diffusion restriction within the right cerebellum and posterior right occipital lobe. Partially empty sella. There is no acute hemorrhage. There are multiple old cerebellar infarcts and old left frontal lobe infarct. Minimal white matter hyperintensity, nonspecific and commonly seen in asymptomatic patients of this age. Periventricular white matter hyperintensity. Generalized atrophy without lobar predilection. Single focus of chronic microhemorrhage in the right frontal lobe. VASCULAR: Major intracranial arterial and venous sinus flow voids are normal. Right vertebral artery probably terminates in PICA. SKULL AND UPPER CERVICAL SPINE: Calvarial bone marrow signal is normal. There is no skull base mass. Visualized upper cervical spine and soft tissues are normal. SINUSES/ORBITS: No fluid levels or advanced mucosal thickening. No mastoid or middle ear effusion. The orbits are normal. IMPRESSION: 1. Multiple pole intake foci of acute ischemia within the right cerebellum and right occipital lobe in a pattern most suggestive of embolic etiology. 2. Old left frontal lobe and old bilateral cerebellar infarcts. 3. No hemorrhage or mass effect. Electronically Signed   By: Deatra Robinson M.D.   On: 01/04/2018 01:45   Nm Hepatobiliary Liver Func  Result Date: 12/29/2017 CLINICAL DATA:  Right upper quadrant pain.  Suspected cholecystitis. EXAM: NUCLEAR  MEDICINE HEPATOBILIARY IMAGING TECHNIQUE: Sequential images of the abdomen were obtained out to 60 minutes following intravenous administration of radiopharmaceutical. RADIOPHARMACEUTICALS:  5.1 mCi Tc-82m  Choletec IV COMPARISON:  CT 12/29/2017. FINDINGS: Prompt uptake and biliary excretion of activity by the liver is seen. Gallbladder activity is visualized, consistent with patency of cystic duct. Biliary activity passes into small bowel, consistent with patent common bile duct. IMPRESSION: Normal exam.  Gallbladder visualizes normally. Electronically Signed   By: Maisie Fus  Register   On: 12/29/2017 15:04   Ct Abdomen Pelvis W Contrast  Result Date: 12/29/2017 CLINICAL DATA:  Patient found down. Fever and suspected abscess. EXAM: CT ABDOMEN AND PELVIS WITH CONTRAST TECHNIQUE: Multidetector CT imaging of the abdomen and pelvis was performed using the standard protocol following bolus administration of intravenous contrast. CONTRAST:  OMNIPAQUE IOHEXOL 300 MG/ML  SOLN COMPARISON:  None. FINDINGS: Lower chest: Motion artifact limits examination. Atelectasis in the lung bases. Postoperative changes in the mediastinum. Cardiac enlargement. Hepatobiliary: No focal liver lesions. Gallbladder wall is thickened and edematous with some stranding in the surrounding fat. This suggest cholecystitis. No stones are visualized. No bile duct dilatation. Pancreas: Fatty infiltration of the pancreas. No focal lesion or inflammatory changes appreciated. Spleen: Normal in size without focal abnormality. Adrenals/Urinary Tract: Adrenal glands are unremarkable. Kidneys are normal, without renal calculi, focal lesion, or hydronephrosis. Bladder is decompressed. Stomach/Bowel: Stomach, small bowel, and colon are not abnormally distended. No wall thickening or inflammatory infiltration are demonstrated. The appendix is normal. Vascular/Lymphatic: Aortic atherosclerosis. No enlarged abdominal or pelvic lymph nodes. Reproductive:  Prostate is unremarkable. Other: No free air or free fluid in the abdomen or pelvis. No loculated fluid collections to suggest abscess. Abdominal wall musculature appears intact. Musculoskeletal: Degenerative changes in the lumbar spine. No destructive bone lesions. IMPRESSION: Gallbladder wall thickening and edema with surrounding infiltration suggesting acute cholecystitis. No stones are visualized. No evidence of bowel obstruction or inflammation. No abscess identified. Electronically Signed   By: Burman Nieves M.D.   On: 12/29/2017 01:26   Ct Angio Ao+bifem W & Or Wo Contrast  Result Date: 01/03/2018 CLINICAL DATA:  Mottled appearance of  lower extremities. Bacteremia. EXAM: CT ANGIOGRAPHY OF ABDOMINAL AORTA WITH ILIOFEMORAL RUNOFF TECHNIQUE: Multidetector CT imaging of the abdomen, pelvis and lower extremities was performed using the standard protocol during bolus administration of intravenous contrast. Multiplanar CT image reconstructions and MIPs were obtained to evaluate the vascular anatomy. CONTRAST:  ISOVUE-370 IOPAMIDOL (ISOVUE-370) INJECTION 76% COMPARISON:  CT abdomen and pelvis 12/29/2017 FINDINGS: VASCULAR Aorta: Atherosclerotic calcifications. No aneurysm, dissection or stenosis. Celiac: Calcified plaque at the origin, widely patent SMA: Calcified plaque at the origin, widely patent Renals: Probable significant narrowing at the origin of the right renal artery. Left renal artery widely patent IMA: Widely patent RIGHT Lower Extremity Inflow: Calcified iliac vessels. No aneurysm, dissection or focal stenosis. Outflow: Common femoral artery, superficial femoral artery and profundus femoris widely patent. Runoff: Calcifications in the popliteal artery and tibioperoneal trunk without significant focal stenosis. Trifurcation vessels are patent into the distal calf where they slowly become unopacified, likely related to timing. LEFT Lower Extremity Inflow: Calcifications in the iliac vessels,  widely patent. No aneurysm or dissection. Outflow: Widely patent common femoral artery, superficial femoral artery and profundus femoris. Calcified plaque in the distal superficial femoral artery. Runoff: Calcified plaque without significant stenosis in the popliteal artery. Occlusion of the tibioperoneal trunk approximately. Anterior tibial artery remains patent to the ankle. There appears to be reconstitution of the posterior tibial artery in the distal calf. Veins: Grossly unremarkable. Review of the MIP images confirms the above findings. NON-VASCULAR Lower chest: Cardiomegaly. Diffuse coronary artery calcifications. Prior median sternotomy, presumably CABG. Small bilateral pleural effusions with bibasilar atelectasis. Hepatobiliary: No focal hepatic abnormality. Gallbladder unremarkable. Pancreas: No focal abnormality or ductal dilatation. Spleen: No focal abnormality.  Normal size. Adrenals/Urinary Tract: Small bilateral renal cysts. No hydronephrosis. Adrenal glands and urinary bladder unremarkable. Stomach/Bowel: Stomach, large and small bowel grossly unremarkable. Lymphatic: No adenopathy Reproductive: Central prostate calcifications. Other: No free fluid or free air. Musculoskeletal: No acute bony abnormality. IMPRESSION: VASCULAR Atherosclerotic calcifications in the aorta and iliac vessels without stenosis, aneurysm or dissection. Right lower extremity vessels are patent without significant stenosis. Occlusion of the left tibioperoneal trunk. Dominant runoff in the left calf is the anterior tibial artery. There appears to be reconstitution of the posterior tibial artery in the distal calf. NON-VASCULAR Small bilateral pleural effusions.  Bibasilar atelectasis. No acute findings in the abdomen or pelvis. Electronically Signed   By: Charlett Nose M.D.   On: 01/03/2018 01:29   Dg Chest Port 1 View  Result Date: 01/01/2018 CLINICAL DATA:  Confusion. EXAM: PORTABLE CHEST 1 VIEW COMPARISON:  12/28/2017  FINDINGS: Prior median sternotomy with aortic valve replacement and left atrial appendage clamp placement. Enlarged cardiac silhouette. Mediastinal contours appear intact for portable technique. Calcific atherosclerotic disease of the aorta. There is no evidence of pneumothorax. Low lung volumes with exaggeration of the interstitial markings. Osseous structures are without acute abnormality. Soft tissues are grossly normal. IMPRESSION: Enlarged cardiac silhouette. Calcific atherosclerotic disease of the aorta. Low lung volumes with secondary exaggeration of the interstitial markings. Electronically Signed   By: Ted Mcalpine M.D.   On: 01/01/2018 10:23     LOS: 6 days   Jeoffrey Massed, MD  [facility]  If 7PM-7AM, please contact night-coverage  Please page via [email or web address]-click on MD name and type text message  01/04/2018, 11:13 AM

## 2018-01-04 NOTE — Progress Notes (Signed)
MEDICATION RELATED NOTE    Pharmacy Re:  Home meds  Medications:  Medications Prior to Admission  Medication Sig Dispense Refill Last Dose  . furosemide (LASIX) 20 MG tablet Take 20 mg by mouth daily as needed (leg swelling).   prn at prn  . gabapentin (NEURONTIN) 400 MG capsule Take 400 mg by mouth 2 (two) times daily.    07/06/2017 at Unknown time  . metFORMIN (GLUCOPHAGE) 500 MG tablet Take 500 mg by mouth 2 (two) times daily with a meal.   07/06/2017 at Unknown time  . metoprolol tartrate (LOPRESSOR) 25 MG tablet Take 37.5 mg by mouth 2 (two) times daily.    07/06/2017 at 1900  . OVER THE COUNTER MEDICATION Take 1 tablet by mouth daily. Collagen w Vit C     . sertraline (ZOLOFT) 50 MG tablet Take 1 tablet (50 mg total) by mouth daily. 90 tablet 0 07/06/2017 at Unknown time  . tamsulosin (FLOMAX) 0.4 MG CAPS capsule Take 1 capsule (0.4 mg total) by mouth daily. 90 capsule 3 07/06/2017 at Unknown time  . tiZANidine (ZANAFLEX) 4 MG tablet Take 4 mg by mouth 2 (two) times daily as needed for muscle spasms.   prn at prn  . vitamin B-12 (CYANOCOBALAMIN) 1000 MCG tablet Take 1,000 mcg by mouth daily.     . Vitamin D, Ergocalciferol, (DRISDOL) 50000 units CAPS capsule Take 50,000 Units by mouth every 7 (seven) days.     Marland Kitchen donepezil (ARICEPT) 10 MG tablet Take 1 tablet (10 mg total) by mouth at bedtime. 30 tablet 0   . ibuprofen (ADVIL,MOTRIN) 200 MG tablet Take 200 mg by mouth every 6 (six) hours as needed for moderate pain.   prn at prn  . potassium chloride (K-DUR) 10 MEQ tablet Take 10 mEq by mouth See admin instructions. One tablet daily only with furosemide   07/06/2017 at Unknown time  . simvastatin (ZOCOR) 20 MG tablet Take 1 tablet (20 mg total) by mouth at bedtime. 30 tablet 0     Assessment: We have made several attempts to confirm this patients home medications.  He has a list but we can't confirm if they are all active or not.    Plan:  We will mark his record complete for now.  Should  you gain new information or the patient is able to participate in his medication review, please let us know and we will be happy to re-interview him.  Nadara Mustard, PharmD., MS Clinical Pharmacist Pager:  [phone] Thank you for allowing pharmacy to be part of this patients care team. 01/04/2018,11:19 AM

## 2018-01-04 NOTE — Clinical Social Work Note (Signed)
Clinical Social Work Assessment  Patient Details  Name: Peter Weiss MRN: [id number] Date of Birth: [birth year]  Date of referral:  01/04/18               Reason for consult:  Facility Placement                Permission sought to share information with:  Family Supports, Magazine features editor Permission granted to share information::  Yes, Verbal Permission Granted  Name::     Masiah Woody  Agency::  SNFs  Relationship::  son  Contact Information:  [phone]  Housing/Transportation Living arrangements for the past 2 months:  Hotel/Motel([facility] ) Source of Information:  Medical Team Patient Interpreter Needed:  None Criminal Activity/Legal Involvement Pertinent to Current Situation/Hospitalization:  No - Comment as needed Significant Relationships:  Adult Children Lives with:  Self Do you feel safe going back to the place where you live?    Need for family participation in patient care:     Care giving concerns:  CSW spoke with patient's son Onalee Hua regarding PT recommendation of SNF placement at time of discharge. Patient was staying at [facility] in [place]. Onalee Hua, reports his father has no family locally. Onalee Hua resides in [place] and his brother, Mellody Dance lives in [place]. They are here (in [place]) until Sunday. Their mother is currently in the hospital in [place]. She has dementia and they are working on finding her placement.  Onalee Hua states, he  visit with his father at the hospital yesterday and explain he was at times confused. CSW advised not sure if patient would be willing to SNF placement. CSW informed that the  patient has  expressed that he was ready to get out of the hospital. Onalee Hua reports that his father will leave against medical advise, he has done so in the past.   Office manager / plan:  CSW spoke with patient's son concerning possibility of rehab at [facility] before returning home. Patient's son agree with discharge plan of  SNF for short term rehab.   Employment status:  Retired Health and safety inspector:  Medicare PT Recommendations:  Skilled Nursing Facility Information / Referral to community resources:  Skilled Nursing Facility  Patient/Family's Response to care:  Patient's family recognizes need for rehab before returning home and is agreeable to a SNF in [facility], family do not have a preference.    Patient/Family's Understanding of and Emotional Response to Diagnosis, Current Treatment, and Prognosis: Family is realistic regarding therapy needs and expressed being hopeful for SNF placement. Family is hoping the patient will improve and become independent enough to move into their home in [facility], if he is willing and able. Patient's family  expressed understanding of CSW role and discharge process as well as medical condition. No questions/concerns about plan or treatment.    Emotional Assessment Appearance:    Attitude/Demeanor/Rapport:  Unable to Assess Affect (typically observed):  Unable to Assess Orientation:  Oriented to Self Alcohol / Substance use:  Not Applicable Psych involvement (Current and /or in the community):  No (Comment)  Discharge Needs  Concerns to be addressed:  Care Coordination, Basic Needs Readmission within the last 30 days:  No Current discharge risk:  Dependent with Mobility Barriers to Discharge:  Continued Medical Work up   [employer], LCSWA 01/04/2018, 10:45 AM

## 2018-01-04 NOTE — Progress Notes (Signed)
  Echocardiogram Echocardiogram Transesophageal has been performed.  Peter Weiss 01/04/2018, 3:41 PM

## 2018-01-04 NOTE — Consult Note (Addendum)
Neurology Consultation  Reason for Consult: Stroke Referring Physician: ghimire  CC: Septic emboli  History is obtained from: Chart and patient  HPI: Peter Weiss is a 77 y.o. male with history of aortic stenosis-status post aortic valve replacement, hypertension, atrial fibrillation, history of GI bleed, hyperlipidemia, AAA.  Patient has altered mental status and dementia.  Per EMS they were called out to an extended stay motel by hotel security.  Patient was found in bed and covered in urine.  He was not alert or oriented.  Although he does have a history of dementia, apparently a woman on scene he stated he was normally alert and oriented and walks his dog daily.   ED course patient was found to have a white blood cell count of 14, lactic acid 2.5, troponin 0 0.14, urinalysis was cloudy with no leukocytes and rare bacteria, patient's temperature was 104 with tachycardia and tachypnea.  She his oxygen sat was 93 to 94%.  Patient was found to be septic with toxic metabolic encephalopathy secondary to MRSA bacteremia and MRI showed septic emboli to the brain.  TEE as below.  Sepsis at this time is being controlled by primary team.  Neurology was asked to see patient secondary infarcts on MRI brain.   LKW: Unknown tpa given?: no, outside window and likely embolic secondary to bacteria Premorbid modified Rankin scale (mRS): 0 NIH stroke scale of 13     ROS:  Unable to obtain due to altered mental status.   Past Medical History:  Diagnosis Date  . Aortic stenosis   . Arthritis   . CHF (congestive heart failure) (HCC)   . Chicken pox   . Depression   . Diabetes mellitus without complication (HCC)   . Heart murmur   . History of fainting spells of unknown cause   . Hyperlipidemia   . Hypertension   . Neuromuscular disorder (HCC)   . Urinary incontinence     Family History  Problem Relation Age of Onset  . Alcohol abuse Mother   . Heart disease Mother   . Diabetes  Mother   . Heart disease Father   . Diabetes Father   . Cancer Mother   . Cancer Father   . Alcohol abuse Father   . Alcohol abuse Brother      Social History:   reports that he has never smoked. He has never used smokeless tobacco. He reports that he drinks alcohol. He reports that he has current or past drug history. Drug: Marijuana.  Medications  Current Facility-Administered Medications:  .  acetaminophen (TYLENOL) tablet 650 mg, 650 mg, Oral, Q6H PRN **OR** acetaminophen (TYLENOL) suppository 650 mg, 650 mg, Rectal, Q6H PRN, Lorretta Harp, MD, 650 mg at 01/02/18 2046 .  folic acid (FOLVITE) tablet 1 mg, 1 mg, Oral, Daily, Hongalgi, Anand D, MD, 1 mg at 01/04/18 1015 .  hydrALAZINE (APRESOLINE) injection 10 mg, 10 mg, Intravenous, Q4H PRN, Elease Etienne, MD, 10 mg at 01/01/18 0548 .  insulin aspart (novoLOG) injection 0-5 Units, 0-5 Units, Subcutaneous, QHS, Hongalgi, Anand D, MD .  insulin aspart (novoLOG) injection 0-9 Units, 0-9 Units, Subcutaneous, TID WC, Hongalgi, Maximino Greenland, MD, 2 Units at 01/04/18 1009 .  metoprolol tartrate (LOPRESSOR) tablet 37.5 mg, 37.5 mg, Oral, BID, Hongalgi, Anand D, MD, 37.5 mg at 01/04/18 1009 .  multivitamin with minerals tablet 1 tablet, 1 tablet, Oral, Daily, Hongalgi, Anand D, MD, 1 tablet at 01/04/18 1010 .  ondansetron (ZOFRAN) tablet 4 mg, 4 mg,  Oral, Q6H PRN **OR** ondansetron (ZOFRAN) injection 4 mg, 4 mg, Intravenous, Q6H PRN, Lorretta Harp, MD .  rifampin (RIFADIN) 300 mg in sodium chloride 0.9 % 100 mL IVPB, 300 mg, Intravenous, Q12H, Judyann Munson, MD, Last Rate: 200 mL/hr at 01/04/18 1012, 300 mg at 01/04/18 1012 .  simvastatin (ZOCOR) tablet 20 mg, 20 mg, Oral, QHS, Hongalgi, Anand D, MD, 20 mg at 01/03/18 0110 .  tamsulosin (FLOMAX) capsule 0.4 mg, 0.4 mg, Oral, Daily, Hongalgi, Anand D, MD, 0.4 mg at 01/04/18 1013 .  thiamine (B-1) injection 100 mg, 100 mg, Intravenous, Daily, Hongalgi, Anand D, MD, 100 mg at 01/04/18 1014 .  vancomycin  (VANCOCIN) IVPB 1000 mg/200 mL premix, 1,000 mg, Intravenous, Q12H, Ann Held, [facility], Last Rate: 200 mL/hr at 01/04/18 0554, 1,000 mg at 01/04/18 0554 .  vitamin B-12 (CYANOCOBALAMIN) tablet 1,000 mcg, 1,000 mcg, Oral, Daily, Hongalgi, Maximino Greenland, MD, 1,000 mcg at 01/04/18 1014   Exam: Current vital signs: BP (!) 104/45   Pulse 87   Temp 98.4 F (36.9 C) (Oral)   Resp (!) 31   Ht 6\' 1"  (1.854 m)   Wt 115 kg   SpO2 95%   BMI 33.45 kg/m  Vital signs in last 24 hours: Temp:  [98.4 F (36.9 C)-101 F (38.3 C)] 98.4 F (36.9 C) (10/23 1238) Pulse Rate:  [86-95] 87 (10/23 1245) Resp:  [26-34] 31 (10/23 1245) BP: (95-127)/(45-62) 104/45 (10/23 1240) SpO2:  [94 %-99 %] 95 % (10/23 1245) FiO2 (%):  [28 %] 28 % (10/22 1641)  Physical Exam  Constitutional: Appears well-developed and well-nourished.  Psych: Affect appropriate to situation Eyes: No scleral injection HENT: No OP obstrucion Head: Normocephalic.  Cardiovascular: Irregular irregular rhythm Respiratory: Effort normal, non-labored breathing GI: Soft.  No distension. There is no tenderness.  Skin: WDI  Neuro: Mental Status: Patient is extremely lethargic, does not answer questions, will tell me to stop touching him and pushes me away. Cranial Nerves: II: Blinks to threat bilaterally.    III,IV, VI: EOMI without ptosis or diploplia.  Pupils are equal, round, and reactive to light. V: Facial sensation is symmetric to temperature VII: Facial movement is symmetric.   Motor: Moving all extremities antigravity secondary to noxious stimuli however this was very difficult as patient did not want to be touched and would not take part in exam Sensory: Moves all extremities to noxious stimuli Deep Tendon Reflexes: Press throughout Plantars: Toes are downgoing bilaterally.  Cerebellar: Not able to obtain secondary to patient not wanting take part in exam   Labs I have reviewed labs in epic and the results pertinent  to this consultation are:   CBC    Component Value Date/Time   WBC 13.7 (H) 01/04/2018 0533   RBC 3.78 (L) 01/04/2018 0533   HGB 10.1 (L) 01/04/2018 0533   HGB 10.0 (L) 03/22/2014 0404   HCT 32.7 (L) 01/04/2018 0533   HCT 30.6 (L) 03/22/2014 0404   PLT 65 (L) 01/04/2018 0533   PLT 160 03/22/2014 0404   MCV 86.5 01/04/2018 0533   MCV 93 03/22/2014 0404   MCH 26.7 01/04/2018 0533   MCHC 30.9 01/04/2018 0533   RDW 17.4 (H) 01/04/2018 0533   RDW 14.4 03/22/2014 0404   LYMPHSABS 0.1 (L) 12/28/2017 2232   LYMPHSABS 1.2 03/22/2014 0404   MONOABS 0.4 12/28/2017 2232   MONOABS 0.6 03/22/2014 0404   EOSABS 0.2 12/28/2017 2232   EOSABS 0.3 03/22/2014 0404   BASOSABS 0.0 12/28/2017 2232  BASOSABS 0.0 03/22/2014 0404    CMP     Component Value Date/Time   NA 141 01/04/2018 0533   NA 140 03/22/2014 0404   K 4.3 01/04/2018 0533   K 4.1 03/22/2014 0404   CL 116 (H) 01/04/2018 0533   CL 106 03/22/2014 0404   CO2 16 (L) 01/04/2018 0533   CO2 28 03/22/2014 0404   GLUCOSE 151 (H) 01/04/2018 0533   GLUCOSE 138 (H) 03/22/2014 0404   BUN 22 01/04/2018 0533   BUN 26 (H) 03/22/2014 0404   CREATININE 1.25 (H) 01/04/2018 0533   CREATININE 1.21 03/22/2014 0404   CALCIUM 7.5 (L) 01/04/2018 0533   CALCIUM 8.6 03/22/2014 0404   PROT 5.0 (L) 01/04/2018 0533   PROT 6.2 (L) 03/22/2014 0404   ALBUMIN 1.8 (L) 01/04/2018 0533   ALBUMIN 2.9 (L) 03/22/2014 0404   AST 24 01/04/2018 0533   AST 22 03/22/2014 0404   ALT 14 01/04/2018 0533   ALT 20 03/22/2014 0404   ALKPHOS 65 01/04/2018 0533   ALKPHOS 61 03/22/2014 0404   BILITOT 2.3 (H) 01/04/2018 0533   BILITOT 0.3 03/22/2014 0404   GFRNONAA 55 (L) 01/04/2018 0533   GFRNONAA >60 03/22/2014 0404   GFRNONAA >60 09/12/2013 2053   GFRAA >60 01/04/2018 0533   GFRAA >60 03/22/2014 0404   GFRAA >60 09/12/2013 2053    Lipid Panel     Component Value Date/Time   CHOL 127 12/29/2017 0254   CHOL 164 03/19/2014 1115   TRIG 87 12/29/2017 0254    TRIG 77 03/19/2014 1115   HDL 27 (L) 12/29/2017 0254   HDL 55 03/19/2014 1115   CHOLHDL 4.7 12/29/2017 0254   VLDL 17 12/29/2017 0254   VLDL 15 03/19/2014 1115   LDLCALC 83 12/29/2017 0254   LDLCALC 94 03/19/2014 1115   TEE shows no evidence of vegetation on any valve.  However there is an area around the bioprosthetic aortic valve that appears thickened, with anterior of tissue containing differing densities of echolucency.  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  No apparent rocking or dehiscence of the valve, no paravalvular leak.  No LA/LAA or RA/RAA thrombi/masses noted  EEG: Showed general background slowing is often seen with diffuse nonspecific etiology such as metabolic encephalopathy.  No epileptiform activity.  Imaging I have reviewed the images obtained:  CT-scan of the brain--no acute intracranial abnormalities identified  MRI examination of the brain--multiple foci of acute ischemia within the right cerebellum and right occipital lobe in a pattern most suggestive of embolic etiology.  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 Concern for septic emboli, however TEE appears negative.  Also no significant hemorrhagic component.  Etiology: Embolic, possibly cardioembolic versus atheroembolic  Recommendations: -No antiplatelets or anticoagulation for now - CTA of head and neck which has been ordered - TEE which has been obtained and results which are above _A1c and lipid profile _Stroke swallow screen _Continue to treat bacteremia    NEUROHOSPITALIST ADDENDUM Performed a face to face diagnostic evaluation.   I have reviewed the contents of history and physical exam as documented by PA/ARNP/Resident and agree with above documentation.  I have  discussed and formulated the above plan as documented. Edits to the note have been made as needed.  I seen  the patient, he is lethargic but is oriented to the fact that he is in the hospital.  He follows some simple commands such as wiggling his toes.  Appears generally weak likely from deconditioning.  Strokes are small mainly over cerebellum and right occipital lobe and less likely to be septic emboli given absence of hemorrhagic component and TEE being negative for vegetations.  Once infection is treated I think at some point we can start aspirin however defer to stroke team.    Georgiana Spinner Fiorela Pelzer MD [facility] [phone]   If 7pm to 7am, please call on call as listed on AMION.

## 2018-01-04 NOTE — Consult Note (Signed)
Reason for Consult:Possible periprosthetic aortic valve abscess Referring Physician: Dr. Antonietta Barcelona [name] is an 77 y.o. male.  HPI: [name] is a 77 yo man with a past history of aortic stenosis, s/p bioprosthetic AVR in 2016, atrial fibrillation, hypertension, hyperlipidemia, diabetes, depression, dementia and a neuromuscular disorder. He was admitted about a week ago with altered mental status and sepsis. MRSA bacteremia. Had an elevated troponin.   Had some improvement initially but then condition worsened again.  Vascular surgery consulted for mottling of LE.  TTE showed good LV function, no perivalvular leak but inadequate to r/o endocarditis.  MR brain last night showed septic emboli to the brain. TEE today showed no vegetation, but suspicious for perivalvular abscess.  Past Medical History:  Diagnosis Date  . Aortic stenosis   . Arthritis   . CHF (congestive heart failure) ([place])   . Chicken pox   . Depression   . Diabetes mellitus without complication ([place])   . Heart murmur   . History of fainting spells of unknown cause   . Hyperlipidemia   . Hypertension   . Neuromuscular disorder ([place])   . Urinary incontinence     Past Surgical History:  Procedure Laterality Date  . AORTIC VALVE REPLACEMENT N/A 04/26/2014   Procedure: AORTIC VALVE REPLACEMENT (AVR);  Surgeon: Melrose Nakayama, MD;  Location: [facility];  Service: Open Heart Surgery;  Laterality: N/A;  . CARDIAC CATHETERIZATION  03/2014  . ESOPHAGOGASTRODUODENOSCOPY N/A 07/04/2014   Procedure: ESOPHAGOGASTRODUODENOSCOPY (EGD);  Surgeon: Carol Ada, MD;  Location: [facility] ENDOSCOPY;  Service: Endoscopy;  Laterality: N/A;  . goiter  2008  . MAZE N/A 04/26/2014   Procedure: MAZE;  Surgeon: Melrose Nakayama, MD;  Location: [facility];  Service: Open Heart Surgery;  Laterality: N/A;  . MULTIPLE EXTRACTIONS WITH ALVEOLOPLASTY N/A 04/24/2014   Procedure: Extraction of tooth #'s 2,3,5,12,13,14,15,20 with allveoloplasty  and gross debridement of remaining dentition and sectioning of bridge at mesial of abutment tooth #32.;  Surgeon: Lenn Cal, DDS;  Location: [facility];  Service: Oral Surgery;  Laterality: N/A;  . TEE WITHOUT CARDIOVERSION N/A 04/26/2014   Procedure: TRANSESOPHAGEAL ECHOCARDIOGRAM (TEE);  Surgeon: Melrose Nakayama, MD;  Location: [facility];  Service: Open Heart Surgery;  Laterality: N/A;    Family History  Problem Relation Age of Onset  . Alcohol abuse Mother   . Heart disease Mother   . Diabetes Mother   . Heart disease Father   . Diabetes Father   . Cancer Mother   . Cancer Father   . Alcohol abuse Father   . Alcohol abuse Brother     Social History:  reports that he has never smoked. He has never used smokeless tobacco. He reports that he drinks alcohol. He reports that he has current or past drug history. Drug: Marijuana.  Allergies: No Known Allergies  Medications:  Scheduled: . folic acid  1 mg Oral Daily  . insulin aspart  0-5 Units Subcutaneous QHS  . insulin aspart  0-9 Units Subcutaneous TID WC  . metoprolol tartrate  37.5 mg Oral BID  . multivitamin with minerals  1 tablet Oral Daily  . simvastatin  20 mg Oral QHS  . tamsulosin  0.4 mg Oral Daily  . thiamine injection  100 mg Intravenous Daily  . vitamin B-12  1,000 mcg Oral Daily    Results for orders placed or performed during the hospital encounter of 12/28/17 (from the past 48 hour(s))  Glucose, capillary  Status: Abnormal   Collection Time: 01/02/18  8:22 PM  Result Value Ref Range   Glucose-Capillary 196 (H) 70 - 99 mg/dL  Basic metabolic panel     Status: Abnormal   Collection Time: 01/03/18  3:51 AM  Result Value Ref Range   Sodium 140 135 - 145 mmol/L   Potassium 4.2 3.5 - 5.1 mmol/L    Comment: NO VISIBLE HEMOLYSIS   Chloride 116 (H) 98 - 111 mmol/L   CO2 20 (L) 22 - 32 mmol/L   Glucose, Bld 212 (H) 70 - 99 mg/dL   BUN 23 8 - 23 mg/dL   Creatinine, Ser 1.22 0.61 - 1.24 mg/dL   Calcium 7.4  (L) 8.9 - 10.3 mg/dL   GFR calc non Af Amer 56 (L) >60 mL/min   GFR calc Af Amer >60 >60 mL/min    Comment: (NOTE) The eGFR has been calculated using the CKD EPI equation. This calculation has not been validated in all clinical situations. eGFR's persistently <60 mL/min signify possible Chronic Kidney Disease.    Anion gap 4 (L) 5 - 15    Comment: Performed at [facility] [address], [place], [place] [address]  CK     Status: Abnormal   Collection Time: 01/03/18  3:51 AM  Result Value Ref Range   Total CK <5 (L) 49 - 397 U/L    Comment: Performed at [facility], [place] [address] [place], [place] [address]  Glucose, capillary     Status: Abnormal   Collection Time: 01/03/18  9:28 AM  Result Value Ref Range   Glucose-Capillary 202 (H) 70 - 99 mg/dL  Glucose, capillary     Status: Abnormal   Collection Time: 01/03/18 12:06 PM  Result Value Ref Range   Glucose-Capillary 176 (H) 70 - 99 mg/dL  Glucose, capillary     Status: Abnormal   Collection Time: 01/03/18  1:46 PM  Result Value Ref Range   Glucose-Capillary 162 (H) 70 - 99 mg/dL  Glucose, capillary     Status: Abnormal   Collection Time: 01/03/18  4:46 PM  Result Value Ref Range   Glucose-Capillary 128 (H) 70 - 99 mg/dL  TSH     Status: None   Collection Time: 01/03/18  7:10 PM  Result Value Ref Range   TSH 1.536 0.350 - 4.500 uIU/mL    Comment: Performed by a 3rd Generation assay with a functional sensitivity of <=0.01 uIU/mL. Performed at [facility], [place] [address], [place], [place] [address]   Vitamin B12     Status: Abnormal   Collection Time: 01/03/18  7:10 PM  Result Value Ref Range   Vitamin B-12 2,945 (H) 180 - 914 pg/mL    Comment: (NOTE) This assay is not validated for testing neonatal or myeloproliferative syndrome specimens for Vitamin B12 levels. Performed at [facility], [place] [address] [place], [place] [address]   RPR     Status: None   Collection Time: 01/03/18  7:10  PM  Result Value Ref Range   RPR Ser Ql Non Reactive Non Reactive    Comment: (NOTE) Performed At: [facility] [address] [place], [place] [id number] Rush Farmer MD YD:[id number]   Glucose, capillary     Status: Abnormal   Collection Time: 01/03/18  9:13 PM  Result Value Ref Range   Glucose-Capillary 160 (H) 70 - 99 mg/dL  Vancomycin, trough     Status: None   Collection Time:  01/04/18  5:33 AM  Result Value Ref Range   Vancomycin Tr 20 15 - 20 ug/mL    Comment: Performed at [facility] [address] [place], [place] [address]  CBC     Status: Abnormal   Collection Time: 01/04/18  5:33 AM  Result Value Ref Range   WBC 13.7 (H) 4.0 - 10.5 K/uL   RBC 3.78 (L) 4.22 - 5.81 MIL/uL   Hemoglobin 10.1 (L) 13.0 - 17.0 g/dL   HCT 32.7 (L) 39.0 - 52.0 %   MCV 86.5 80.0 - 100.0 fL   MCH 26.7 26.0 - 34.0 pg   MCHC 30.9 30.0 - 36.0 g/dL   RDW 17.4 (H) 11.5 - 15.5 %   Platelets 65 (L) 150 - 400 K/uL    Comment: REPEATED TO VERIFY SPECIMEN CHECKED FOR CLOTS Immature Platelet Fraction may be clinically indicated, consider ordering this additional test [id number] CONSISTENT WITH PREVIOUS RESULT    nRBC 0.0 0.0 - 0.2 %    Comment: Performed at [facility], [place] [address] [place], [place] [address]  Comprehensive metabolic panel     Status: Abnormal   Collection Time: 01/04/18  5:33 AM  Result Value Ref Range   Sodium 141 135 - 145 mmol/L   Potassium 4.3 3.5 - 5.1 mmol/L   Chloride 116 (H) 98 - 111 mmol/L   CO2 16 (L) 22 - 32 mmol/L   Glucose, Bld 151 (H) 70 - 99 mg/dL   BUN 22 8 - 23 mg/dL   Creatinine, Ser 1.25 (H) 0.61 - 1.24 mg/dL   Calcium 7.5 (L) 8.9 - 10.3 mg/dL   Total Protein 5.0 (L) 6.5 - 8.1 g/dL   Albumin 1.8 (L) 3.5 - 5.0 g/dL   AST 24 15 - 41 U/L   ALT 14 0 - 44 U/L   Alkaline Phosphatase 65 38 - 126 U/L   Total Bilirubin 2.3 (H) 0.3 - 1.2 mg/dL   GFR calc non Af Amer 55 (L) >60 mL/min   GFR calc Af Amer >60 >60 mL/min    Comment:  (NOTE) The eGFR has been calculated using the CKD EPI equation. This calculation has not been validated in all clinical situations. eGFR's persistently <60 mL/min signify possible Chronic Kidney Disease.    Anion gap 9 5 - 15    Comment: Performed at [facility] [address], [place], [place] [address]  Glucose, capillary     Status: Abnormal   Collection Time: 01/04/18  7:46 AM  Result Value Ref Range   Glucose-Capillary 152 (H) 70 - 99 mg/dL  Glucose, capillary     Status: Abnormal   Collection Time: 01/04/18  1:45 PM  Result Value Ref Range   Glucose-Capillary 134 (H) 70 - 99 mg/dL    Ct Angio Head W Or Wo Contrast  Result Date: 01/04/2018 CLINICAL DATA:  Focal neuro deficit. Dementia. Elevated white count. Sepsis with toxic metabolic encephalopathy. MRSA. Septic emboli to brain. EXAM: CT ANGIOGRAPHY HEAD AND NECK TECHNIQUE: Multidetector CT imaging of the head and neck was performed using the standard protocol during bolus administration of intravenous contrast. Multiplanar CT image reconstructions and MIPs were obtained to evaluate the vascular anatomy. Carotid stenosis measurements (when applicable) are obtained utilizing NASCET criteria, using the distal internal carotid diameter as the denominator. CONTRAST:  54m ISOVUE-370 IOPAMIDOL (ISOVUE-370) INJECTION 76% COMPARISON:  MR brain 01/04/2018. FINDINGS: CTA NECK FINDINGS Aortic arch: Standard branching. Imaged portion shows no evidence of aneurysm  or dissection. Heavily calcified great vessel origins without significant stenosis. Right carotid system: Heavily calcified plaque at the bifurcation, extending into the proximal ICA. Luminal measurements of 1.7/3.7 correspond to a stenosis slightly greater than 50%. This is not clearly flow reducing. No ulceration, or dissection. Left carotid system: No evidence of dissection, stenosis (50% or greater) or occlusion. Vertebral arteries: Dominant LEFT vertebral. Heavily calcified  ostial stenosis estimated 75%. Diminutive RIGHT vertebral, also with calcification its origin, supplies only PICA. Skeleton: Spondylosis. Poor dentition. No worrisome osseous lesion. Other neck: No masses. Upper chest: RIGHT upper lobe ill-defined opacity could represent pneumonia. Layering fluid along the LEFT major fissure. No pneumothorax. Review of the MIP images confirms the above findings CTA HEAD FINDINGS Anterior circulation: Calcific nonstenotic atheromatous change both carotid bifurcations. ICA termini widely patent. Dominant LEFT anterior cerebral, with diminutive hypoplastic or diseased RIGHT A1 ACA. Both anterior cerebral spill from the LEFT. No M1 or M2 stenosis/occlusion. Posterior circulation: Heavily calcified LEFT vertebral, V4 segment. This vessel is the sole contributor to the basilar which is widely patent. Diminutive RIGHT vertebral contributes only to PICA which is patent. No cerebellar branch occlusion. No PCA stenosis or occlusion. Venous sinuses: Patent Anatomic variants: Balanced contribution to the LEFT PCA from carotid and basilar. Delayed phase: No abnormal postcontrast enhancement. Old LEFT frontal infarct. Areas of acute infarction are poorly visualized. Review of the MIP images confirms the above findings IMPRESSION: No intracranial mycotic aneurysm or large vessel occlusion related to embolic material. Dominant LEFT vertebral supplies the basilar. Diminutive RIGHT vertebral supplies only PICA. Both are diseased at their origin. Given this anatomy, the observed pattern of acute intracranial infarction affecting the RIGHT occipital lobe and RIGHT inferior cerebellum is most consistent with embolic infarcts, which may or may not be septic. Greater than 50% stenosis due to calcific plaque at the RIGHT carotid bifurcation, but not clearly flow reducing. No dissection. RIGHT upper lobe opacity, and LEFT pleural effusion, incompletely evaluated. CT chest could be helpful in further  evaluation. No abnormal postcontrast enhancement of the brain or meninges is visible. Areas of acute infarction or poorly seen however. Electronically Signed   By: Staci Righter M.D.   On: 01/04/2018 15:09   Ct Angio Neck W Or Wo Contrast  Result Date: 01/04/2018 CLINICAL DATA:  Focal neuro deficit. Dementia. Elevated white count. Sepsis with toxic metabolic encephalopathy. MRSA. Septic emboli to brain. EXAM: CT ANGIOGRAPHY HEAD AND NECK TECHNIQUE: Multidetector CT imaging of the head and neck was performed using the standard protocol during bolus administration of intravenous contrast. Multiplanar CT image reconstructions and MIPs were obtained to evaluate the vascular anatomy. Carotid stenosis measurements (when applicable) are obtained utilizing NASCET criteria, using the distal internal carotid diameter as the denominator. CONTRAST:  42m ISOVUE-370 IOPAMIDOL (ISOVUE-370) INJECTION 76% COMPARISON:  MR brain 01/04/2018. FINDINGS: CTA NECK FINDINGS Aortic arch: Standard branching. Imaged portion shows no evidence of aneurysm or dissection. Heavily calcified great vessel origins without significant stenosis. Right carotid system: Heavily calcified plaque at the bifurcation, extending into the proximal ICA. Luminal measurements of 1.7/3.7 correspond to a stenosis slightly greater than 50%. This is not clearly flow reducing. No ulceration, or dissection. Left carotid system: No evidence of dissection, stenosis (50% or greater) or occlusion. Vertebral arteries: Dominant LEFT vertebral. Heavily calcified ostial stenosis estimated 75%. Diminutive RIGHT vertebral, also with calcification its origin, supplies only PICA. Skeleton: Spondylosis. Poor dentition. No worrisome osseous lesion. Other neck: No masses. Upper chest: RIGHT upper lobe ill-defined opacity could represent  pneumonia. Layering fluid along the LEFT major fissure. No pneumothorax. Review of the MIP images confirms the above findings CTA HEAD FINDINGS  Anterior circulation: Calcific nonstenotic atheromatous change both carotid bifurcations. ICA termini widely patent. Dominant LEFT anterior cerebral, with diminutive hypoplastic or diseased RIGHT A1 ACA. Both anterior cerebral spill from the LEFT. No M1 or M2 stenosis/occlusion. Posterior circulation: Heavily calcified LEFT vertebral, V4 segment. This vessel is the sole contributor to the basilar which is widely patent. Diminutive RIGHT vertebral contributes only to PICA which is patent. No cerebellar branch occlusion. No PCA stenosis or occlusion. Venous sinuses: Patent Anatomic variants: Balanced contribution to the LEFT PCA from carotid and basilar. Delayed phase: No abnormal postcontrast enhancement. Old LEFT frontal infarct. Areas of acute infarction are poorly visualized. Review of the MIP images confirms the above findings IMPRESSION: No intracranial mycotic aneurysm or large vessel occlusion related to embolic material. Dominant LEFT vertebral supplies the basilar. Diminutive RIGHT vertebral supplies only PICA. Both are diseased at their origin. Given this anatomy, the observed pattern of acute intracranial infarction affecting the RIGHT occipital lobe and RIGHT inferior cerebellum is most consistent with embolic infarcts, which may or may not be septic. Greater than 50% stenosis due to calcific plaque at the RIGHT carotid bifurcation, but not clearly flow reducing. No dissection. RIGHT upper lobe opacity, and LEFT pleural effusion, incompletely evaluated. CT chest could be helpful in further evaluation. No abnormal postcontrast enhancement of the brain or meninges is visible. Areas of acute infarction or poorly seen however. Electronically Signed   By: Staci Righter M.D.   On: 01/04/2018 15:09   Mr Brain Wo Contrast  Result Date: 01/04/2018 CLINICAL DATA:  Altered mental status EXAM: MRI HEAD WITHOUT CONTRAST TECHNIQUE: Multiplanar, multiecho pulse sequences of the brain and surrounding structures were  obtained without intravenous contrast. COMPARISON:  Head CT 12/29/2017 FINDINGS: BRAIN: There are multiple punctate foci of abnormal diffusion restriction within the right cerebellum and posterior right occipital lobe. Partially empty sella. There is no acute hemorrhage. There are multiple old cerebellar infarcts and old left frontal lobe infarct. Minimal white matter hyperintensity, nonspecific and commonly seen in asymptomatic patients of this age. Periventricular white matter hyperintensity. Generalized atrophy without lobar predilection. Single focus of chronic microhemorrhage in the right frontal lobe. VASCULAR: Major intracranial arterial and venous sinus flow voids are normal. Right vertebral artery probably terminates in PICA. SKULL AND UPPER CERVICAL SPINE: Calvarial bone marrow signal is normal. There is no skull base mass. Visualized upper cervical spine and soft tissues are normal. SINUSES/ORBITS: No fluid levels or advanced mucosal thickening. No mastoid or middle ear effusion. The orbits are normal. IMPRESSION: 1. Multiple pole intake foci of acute ischemia within the right cerebellum and right occipital lobe in a pattern most suggestive of embolic etiology. 2. Old left frontal lobe and old bilateral cerebellar infarcts. 3. No hemorrhage or mass effect. Electronically Signed   By: Ulyses Jarred M.D.   On: 01/04/2018 01:45   Ct Angio Ao+bifem W & Or Wo Contrast  Result Date: 01/03/2018 CLINICAL DATA:  Mottled appearance of lower extremities. Bacteremia. EXAM: CT ANGIOGRAPHY OF ABDOMINAL AORTA WITH ILIOFEMORAL RUNOFF TECHNIQUE: Multidetector CT imaging of the abdomen, pelvis and lower extremities was performed using the standard protocol during bolus administration of intravenous contrast. Multiplanar CT image reconstructions and MIPs were obtained to evaluate the vascular anatomy. CONTRAST:  163m ISOVUE-370 IOPAMIDOL (ISOVUE-370) INJECTION 76% COMPARISON:  CT abdomen and pelvis 12/29/2017  FINDINGS: VASCULAR Aorta: Atherosclerotic calcifications. No aneurysm, dissection  or stenosis. Celiac: Calcified plaque at the origin, widely patent SMA: Calcified plaque at the origin, widely patent Renals: Probable significant narrowing at the origin of the right renal artery. Left renal artery widely patent IMA: Widely patent RIGHT Lower Extremity Inflow: Calcified iliac vessels. No aneurysm, dissection or focal stenosis. Outflow: Common femoral artery, superficial femoral artery and profundus femoris widely patent. Runoff: Calcifications in the popliteal artery and tibioperoneal trunk without significant focal stenosis. Trifurcation vessels are patent into the distal calf where they slowly become unopacified, likely related to timing. LEFT Lower Extremity Inflow: Calcifications in the iliac vessels, widely patent. No aneurysm or dissection. Outflow: Widely patent common femoral artery, superficial femoral artery and profundus femoris. Calcified plaque in the distal superficial femoral artery. Runoff: Calcified plaque without significant stenosis in the popliteal artery. Occlusion of the tibioperoneal trunk approximately. Anterior tibial artery remains patent to the ankle. There appears to be reconstitution of the posterior tibial artery in the distal calf. Veins: Grossly unremarkable. Review of the MIP images confirms the above findings. NON-VASCULAR Lower chest: Cardiomegaly. Diffuse coronary artery calcifications. Prior median sternotomy, presumably CABG. Small bilateral pleural effusions with bibasilar atelectasis. Hepatobiliary: No focal hepatic abnormality. Gallbladder unremarkable. Pancreas: No focal abnormality or ductal dilatation. Spleen: No focal abnormality.  Normal size. Adrenals/Urinary Tract: Small bilateral renal cysts. No hydronephrosis. Adrenal glands and urinary bladder unremarkable. Stomach/Bowel: Stomach, large and small bowel grossly unremarkable. Lymphatic: No adenopathy Reproductive:  Central prostate calcifications. Other: No free fluid or free air. Musculoskeletal: No acute bony abnormality. IMPRESSION: VASCULAR Atherosclerotic calcifications in the aorta and iliac vessels without stenosis, aneurysm or dissection. Right lower extremity vessels are patent without significant stenosis. Occlusion of the left tibioperoneal trunk. Dominant runoff in the left calf is the anterior tibial artery. There appears to be reconstitution of the posterior tibial artery in the distal calf. NON-VASCULAR Small bilateral pleural effusions.  Bibasilar atelectasis. No acute findings in the abdomen or pelvis. Electronically Signed   By: Rolm Baptise M.D.   On: 01/03/2018 01:29    Review of Systems  Unable to perform ROS: Mental status change   Blood pressure 99/71, pulse 87, temperature 97.8 F (36.6 C), temperature source Axillary, resp. rate (!) 29, height 6' 1"  (1.854 m), weight 114.9 kg, SpO2 96 %. Physical Exam  Vitals reviewed. Constitutional:  Ill-appearing, minimally responsive, uncooperative  HENT:  Head: Normocephalic and atraumatic.  Eyes: Conjunctivae and EOM are normal.  Neck: No thyromegaly present.  Cardiovascular: Regular rhythm and normal heart sounds.  No murmur heard. tachy  Respiratory: Breath sounds normal. He has no wheezes. He has no rales.  GI: Soft. There is no tenderness.  Neurological:  Uncooperative with exam, does withdraw to stimuli  Skin:  Mottled LE bilaterally    Assessment/Plan: 77 yo man with multiple medical problems who presented a week ago with altered mental status and sepsis. Blood cultures growing MRSA. He was found to have multiple embolic strokes. TEE did not show any vegetation but he does have an area concerning for a perivalvular abscess.   He has persistent fevers, altered mental status, poor peripheral perfusion, and severe protein calorie malnutrition.  He is not a candidate to undergo redo surgery for aortic root replacement with a  homograft at this time.   I discussed these issues with his son Peter Weiss. He understands my concerns re: his father's ability to withstand an operation of that magnitude and have any chance for survival with a reasonable quality of life.   For now continue  with IV antibiotics.  For now we need to optimize his clinical status and will continue to reassess  Melrose Nakayama 01/04/2018, 5:55 PM

## 2018-01-04 NOTE — CV Procedure (Signed)
Brief TEE note, full report to follow in [facility]  No evidence of vegetation on any valve. However, area around bioprosthetic aortic valve appears thickened, with interior of tissue containing differing densities of echolucency. 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 No apparent rocking or dehiscence of valve, no paravalvular leak, trivial central aortic regurgitation.  No LA/LAA or RA/RAA thrombi/masses noted. Pleural effusion noted.  Study suggests abscess around bioprosthetic aortic valve.  During this procedure the patient is administered a total of Versed 3 mg and Fentanyl 50 mcg to achieve and maintain moderate conscious sedation.  The patient's heart rate, blood pressure, and oxygen saturation are monitored continuously during the procedure. The period of conscious sedation is 21 minutes, of which I was present face-to-face 100% of this time.  Jodelle Red, MD, PhD [facility]  [address], [address] [place], [place] [address] [phone]

## 2018-01-04 NOTE — [place] FL2 (Addendum)
[place] MEDICAID FL2 LEVEL OF CARE SCREENING TOOL     IDENTIFICATION  Patient Name: Peter Weiss Birthdate: 03-11-1941 Sex: male Admission Date (Current Location): 12/28/2017  [facility] [place] and [place] Number:  Producer, television/film/video and Address:  [facility], [address] [place], [place] [address]      Provider Number: [id number]  Attending Physician Name and Address:  Maretta Bees, MD  Relative Name and Phone Number:  Claud Kelp, sons, [phone]    Current Level of Care: Hospital Recommended Level of Care: Skilled Nursing Facility Prior Approval Number:    Date Approved/Denied:   PASRR Number: [id number]  Discharge Plan: SNF    Current Diagnoses: Patient Active Problem List   Diagnosis Date Noted  . MRSA bacteremia 12/30/2017  . Sepsis (HCC) 12/29/2017  . Acute cholecystitis 12/29/2017  . CKD (chronic kidney disease), stage III (HCC) 12/29/2017  . Chronic diastolic CHF (congestive heart failure) (HCC) 12/29/2017  . BPH (benign prostatic hyperplasia) 12/29/2017  . Acute metabolic encephalopathy 12/29/2017  . Hypokalemia 12/29/2017  . Elevated troponin 12/29/2017  . Evaluation by psychiatric service required   . Acute encephalopathy 07/07/2017  . Acute lower UTI 07/07/2017  . Dementia (HCC) 07/07/2017  . Normocytic anemia 07/07/2017  . Multiple falls 12/23/2014  . DDD (degenerative disc disease), lumbar 09/09/2014  . Facet syndrome, lumbar 09/09/2014  . Spinal stenosis, lumbar region, with neurogenic claudication 09/09/2014  . Neuropathy due to secondary diabetes (HCC) 09/09/2014  . Upper GI bleed   . Acute blood loss anemia   . Abdominal aortic aneurysm (HCC)   . Essential hypertension   . Adjustment disorder with disturbance of conduct   . Chronic pain syndrome   . Urinary frequency   . Hemorrhagic shock (HCC) 07/04/2014  . S/P AVR (aortic valve replacement) 07/04/2014  . Chest pain 07/04/2014  .  Gastrointestinal hemorrhage 07/04/2014  . Melena   . Acute kidney injury (HCC)   . Aortic stenosis   . Atrial fibrillation, chronic   . Dental caries   . Pyrexia   . Aortic valve stenosis, critical 04/20/2014  . Syncope and collapse 04/18/2014  . Dehydration 04/18/2014  . Hypotension 04/18/2014  . Lethargic 03/25/2014  . Adjustment disorder with mixed anxiety and depressed mood 03/25/2014  . Chronic back pain 09/28/2013  . Mild cognitive impairment 09/28/2013  . Type II diabetes mellitus with renal manifestations (HCC) 09/28/2013  . HLD (hyperlipidemia) 09/28/2013  . GERD (gastroesophageal reflux disease) 09/28/2013    Orientation RESPIRATION BLADDER Height & Weight     Self, Place  O2(Nasal cannula 2L) Incontinent, External catheter Weight: 115 kg Height:  6\' 1"  (185.4 cm)  BEHAVIORAL SYMPTOMS/MOOD NEUROLOGICAL BOWEL NUTRITION STATUS      Incontinent(Rectal pouch) Diet(Please see DC Summary)  AMBULATORY STATUS COMMUNICATION OF NEEDS Skin   Extensive Assist Verbally Normal                       Personal Care Assistance Level of Assistance  Bathing, Feeding, Dressing Bathing Assistance: Maximum assistance Feeding assistance: Maximum assistance Dressing Assistance: Maximum assistance     Functional Limitations Info  Sight, Hearing, Speech Sight Info: Adequate Hearing Info: Impaired Speech Info: Adequate    SPECIAL CARE FACTORS FREQUENCY  PT (By licensed PT), OT (By licensed OT), Speech therapy     PT Frequency: 5x/week OT Frequency: 3x/week     Speech Therapy Frequency: 2x/week      Contractures Contractures Info: Not present  Additional Factors Info  Code Status, Allergies, Insulin Sliding Scale Code Status Info: Full Allergies Info: NKA   Insulin Sliding Scale Info: 3x daily with meals and at bedtime   Isolation:MRSA bacteremia       Current Medications (01/04/2018):  This is the current hospital active medication list Current  Facility-Administered Medications  Medication Dose Route Frequency Provider Last Rate Last Dose  . 0.9 %  sodium chloride infusion   Intravenous Continuous Judy Pimple M., PA-C 20 mL/hr at 01/03/18 1743    . acetaminophen (TYLENOL) tablet 650 mg  650 mg Oral Q6H PRN Lorretta Harp, MD       Or  . acetaminophen (TYLENOL) suppository 650 mg  650 mg Rectal Q6H PRN Lorretta Harp, MD   650 mg at 01/02/18 2046  . folic acid (FOLVITE) tablet 1 mg  1 mg Oral Daily Elease Etienne, MD   1 mg at 01/03/18 1006  . hydrALAZINE (APRESOLINE) injection 10 mg  10 mg Intravenous Q4H PRN Elease Etienne, MD   10 mg at 01/01/18 0548  . insulin aspart (novoLOG) injection 0-5 Units  0-5 Units Subcutaneous QHS Hongalgi, Anand D, MD      . insulin aspart (novoLOG) injection 0-9 Units  0-9 Units Subcutaneous TID WC Elease Etienne, MD   1 Units at 01/03/18 1741  . metoprolol tartrate (LOPRESSOR) tablet 37.5 mg  37.5 mg Oral BID Marcellus Scott D, MD   37.5 mg at 01/03/18 1006  . multivitamin with minerals tablet 1 tablet  1 tablet Oral Daily Elease Etienne, MD   1 tablet at 01/03/18 1006  . ondansetron (ZOFRAN) tablet 4 mg  4 mg Oral Q6H PRN Lorretta Harp, MD       Or  . ondansetron [facility]) injection 4 mg  4 mg Intravenous Q6H PRN Lorretta Harp, MD      . rifampin (RIFADIN) 300 mg in sodium chloride 0.9 % 100 mL IVPB  300 mg Intravenous Q12H Judyann Munson, MD 200 mL/hr at 01/03/18 2203 300 mg at 01/03/18 2203  . simvastatin (ZOCOR) tablet 20 mg  20 mg Oral QHS Hongalgi, Anand D, MD   20 mg at 01/03/18 0110  . tamsulosin (FLOMAX) capsule 0.4 mg  0.4 mg Oral Daily Marcellus Scott D, MD   0.4 mg at 01/03/18 1006  . thiamine (B-1) injection 100 mg  100 mg Intravenous Daily Elease Etienne, MD   100 mg at 01/03/18 0958  . vancomycin (VANCOCIN) IVPB 1000 mg/200 mL premix  1,000 mg Intravenous Q12H Ann Held, RPH 200 mL/hr at 01/04/18 0554 1,000 mg at 01/04/18 0554  . vitamin B-12 (CYANOCOBALAMIN) tablet 1,000  mcg  1,000 mcg Oral Daily Elease Etienne, MD   1,000 mcg at 01/03/18 1006     Discharge Medications: Please see discharge summary for a list of discharge medications.  Relevant Imaging Results:  Relevant Lab Results:   Additional Information SSN: [id number] [place], [place]

## 2018-01-04 NOTE — Progress Notes (Signed)
ID PROGRESS NOTE  77yo M with hx of AVR admitted with sepsis found to have MRSA bacteremia, and ongoing encephalopathy and fever. Had MRI brain last night - concerning for acute infarct to right cerebellum and right occipital lobe concerning for septic emboli/endocarditis  This afternoon - patient underwent TEE that confirmed suspicion for endocarditis and found to have perivalvular abscess  A/P:MRSA AV endocarditis c/b perivalvular abscess with CNS septic emboli Continue on vancomycin, will switch to dosing strategy to AUC calculations rather than peak-trough to minimize AKI associated with vancomycin. Repeat blood cx ngtd  In terms of perivalvular abscess with CNS septic emboli -he isl unlikely be a surgical candidate given some of his co morbidities, but would still confer with CT surgery to have agreement  Mottling of LE - could be explained by  septic emboli       [facility] Management Team Staphylococcus aureus bacteremia   Staphylococcus aureus bacteremia (SAB) is associated with a high rate of complications and mortality.  Specific aspects of clinical management are critical to optimizing the outcome of patients with SAB.  Therefore, the [facility] Antimicrobial Management Team [facility]) has initiated an intervention aimed at improving the management of SAB at [facility].  To do so, Infectious Diseases physicians are providing an evidence-based consult for the management of all patients with SAB.     Yes No Comments  Perform follow-up blood cultures (even if the patient is afebrile) to ensure clearance of bacteremia [x]  []         Perform echocardiography to evaluate for endocarditis (transthoracic ECHO is 40-50% sensitive, TEE is > 90% sensitive) [x]  []         Ensure source control [x]  []  Have all abscesses been drained effectively? Have deep seeded infections (septic joints or osteomyelitis) had appropriate surgical debridement?  Investigate for "metastatic" sites of  infection [x]  []  Does the patient have ANY symptom or physical exam finding that would suggest a deeper infection (back or neck pain that may be suggestive of vertebral osteomyelitis or epidural abscess, muscle pain that could be a symptom of pyomyositis)?  Keep in mind that for deep seeded infections MRI imaging with contrast is preferred rather than other often insensitive tests such as plain x-rays, especially early in a patient's presentation.  Change antibiotic therapy to ____vancomcyin_______ [x]  []  Beta-lactam antibiotics are preferred for MSSA due to higher cure rates.   If on Vancomycin, goal trough should be 15 - 20 mcg/mL  Estimated duration of IV antibiotic therapy:  6 wk [x]  []  Consult case management for probably prolonged outpatient IV antibiotic therapy

## 2018-01-04 NOTE — Progress Notes (Addendum)
Initial Nutrition Assessment  DOCUMENTATION CODES:   Obesity unspecified  INTERVENTION:  Once diet advanced:  Ensure Enlive BID. Each supplement provides 350 kcal and 20 grams protein. Patient likes chocolate flavor.   NUTRITION DIAGNOSIS:   Inadequate oral intake related to inability to eat, lethargy/confusion as evidenced by energy intake < or equal to 50% for > or equal to 5 days, NPO status.  GOAL:   Patient will meet greater than or equal to 90% of their needs  MONITOR:   PO intake, Supplement acceptance, Diet advancement, Weight trends, Labs  REASON FOR ASSESSMENT:   Consult Assessment of nutrition requirement/status  ASSESSMENT:    Peter Weiss is a 77 yo male with PMH of aortic valve stenosis, s/p AVR (bovine valve), T2DM, dementia, lumbar DDD, HTN, HLD, depression, BPH, afib, GI bleeding who is admitted for acute metabolic ecephalopathy, suspected acute cholecystitis, MRSA bacteremia, AKI, CHF.   Visited pt at bedside. Pt with AMS and confusion. Unable to obtain detailed hx. Pt able to say that he is hungry and wants a chocolate milkshake. Obtained bed wt of 114.9 kg.   Per chart pt lives at extended stay hotel and has hx alcohol abuse.   10/16-10/20 pt NPO 10/21 put on heart healthy diet in AM. PM NPO again   10/21 swallow eval rec D1 w/ thins  10/23 NPO for TEE  Pt appears to have been NPO for 5/7 days of admission.  Limited data about meal completion during this time. Spoke with RN who is unsure about when diet will be advanced. Will follow this pt with regard to diet advancement   10/23 TEE shows possible perivalvular abscess.   Of note, palliative care consulted for this pt.   Medications reviewed and include: folic acid, novolog ss, MVI, thiamine, vitamin B-12 Labs reviewed: CBGs 128-162, B12 2,945   NUTRITION - FOCUSED PHYSICAL EXAM:    Most Recent Value  Orbital Region  No depletion  Upper Arm Region  No depletion  Thoracic and Lumbar Region   No depletion  Buccal Region  No depletion  Temple Region  Mild depletion  Clavicle Bone Region  No depletion  Clavicle and Acromion Bone Region  Mild depletion  Scapular Bone Region  Unable to assess  Dorsal Hand  No depletion  Patellar Region  No depletion  Anterior Thigh Region  No depletion  Posterior Calf Region  No depletion  Edema (RD Assessment)  Moderate  Hair  Reviewed  Eyes  Reviewed  Mouth  Reviewed [poor dentition]  Skin  Reviewed [mottled LE]  Nails  Reviewed [yellowed cracked nails]      Diet Order:   Diet Order    None      EDUCATION NEEDS:   Not appropriate for education at this time  Skin:  Skin Assessment: Reviewed RN Assessment  Last BM:  10/22  Height:   Ht Readings from Last 1 Encounters:  12/29/17 6\' 1"  (1.854 m)    Weight:   Wt Readings from Last 1 Encounters:  01/04/18 114.9 kg    Ideal Body Weight:  84 kg  BMI:  Body mass index is 33.42 kg/m.  Estimated Nutritional Needs:   Kcal:  2200-2400  Protein:  110-120 grams  Fluid:  2.2 - 2.4 L    Peter Weiss, Dietetic Intern [phone]

## 2018-01-04 NOTE — Progress Notes (Signed)
SLP Cancellation Note  Patient Details Name: Peter Weiss MRN: [id number] DOB: September 10, 1940   Cancelled treatment:       Reason Eval/Treat Not Completed: Other (comment). Unable to assess diet tolerance, as pt is currently NPO for TEE. Will continue efforts.  Yamel Bale B. Murvin Natal [facility], CCC-SLP Speech Language Pathologist [phone]  Leigh Aurora 01/04/2018, 10:18 AM

## 2018-01-04 NOTE — Progress Notes (Signed)
Pharmacy Antibiotic Note  Peter Weiss is a 77 y.o. male admitted on 12/28/2017 with AMS and found to have MRSA bacteremia.  TEE shows no vegetations, but concern for aortic valve abscess - Rifampin has been added. Pharmacy has been consulted for Vancomycin dosing.   A Vancomycin trough this morning resulted in a therapeutic VT of 20 mcg/ml, (goal of 15-20 mcg/ml). Renal function is stable with SCr at 1.25, CrCl~50-68 ml/min, but U/O is lower. Due to risk of accumulation will decrease dose by 25%.   Plan: - Adjust Vancomycin to 750mg  IV every 12 hours - Will follow-up with a repeat trough level at steady state - Will continue to follow renal function, culture results, LOT, and antibiotic de-escalation plans   Height: 6\' 1"  (185.4 cm) Weight: 253 lb 8.5 oz (115 kg) IBW/kg (Calculated) : 79.9  Temp (24hrs), Avg:99.6 F (37.6 C), Min:98.4 F (36.9 C), Max:101 F (38.3 C)  Recent Labs  Lab 12/28/17 2242 12/29/17 0004 12/29/17 0254 12/29/17 0531  12/29/17 2109 12/30/17 0812  12/31/17 1205  01/01/18 0347 01/01/18 1710 01/02/18 0531 01/03/18 0351 01/04/18 0533  WBC  --   --  10.7*  --    < >  --  8.6  --  11.1*  --  13.6*  --  12.5*  --  13.7*  CREATININE  --   --  1.40*  --    < >  --  1.51*   < >  --    < > 1.21 1.14 1.06 1.22 1.25*  LATICACIDVEN 2.50* 3.12* 2.1* 2.3*  --  1.4  --   --   --   --   --   --   --   --   --   VANCOTROUGH  --   --   --   --   --   --   --   --   --   --   --  9*  --   --  20   < > = values in this interval not displayed.    Estimated Creatinine Clearance: 67.8 mL/min (A) (by C-G formula based on SCr of 1.25 mg/dL (H)).    No Known Allergies  Antimicrobials this admission: Cefepime 10/16 x 1 Vanc 10/16 >> Zosyn 10/16 >> 10/17 Rifampin 10/18 >>  Dose adjustments this admission: 10/20 VT 9 mcg/ml on 1250 mg/24h >> increase to 1g/12h 10/23 VT 20 mcg/ml on 1000mg  IV q12h>>decrease to 750mg  q12h  Microbiology results: 10/16 BCx >> 2/2  MRSA BCID MRSA 10/17 UCx >> 20k E faecalis, 10k SA 10/18 BCx: ngtd  Thank you for allowing pharmacy to be a part of this patient's care.  Gracelyn Coventry A. Jeanella Craze, PharmD, BCPS Clinical Pharmacist [place] Pager: [phone] Please utilize Amion for appropriate phone number to reach the unit pharmacist [facility])   01/04/2018 12:52 PM

## 2018-01-04 NOTE — Interval H&P Note (Signed)
History and Physical Interval Note:  01/04/2018 12:00 PM  Peter Weiss  has presented today for surgery, with the diagnosis of BACTEREMIA  The various methods of treatment have been discussed with the patient and family. After consideration of risks, benefits and other options for treatment, the patient has consented to  Procedure(s): TRANSESOPHAGEAL ECHOCARDIOGRAM (TEE) (N/A) as a surgical intervention .  The patient's history has been reviewed, patient examined, no change in status, stable for surgery.  I have reviewed the patient's chart and labs.  Questions were answered to the patient's satisfaction.     Keiden Deskin Cristal Deer

## 2018-01-05 ENCOUNTER — Encounter (HOSPITAL_COMMUNITY): Payer: Self-pay | Admitting: Cardiology

## 2018-01-05 DIAGNOSIS — Z66 Do not resuscitate: Secondary | ICD-10-CM

## 2018-01-05 DIAGNOSIS — Z515 Encounter for palliative care: Secondary | ICD-10-CM

## 2018-01-05 DIAGNOSIS — I330 Acute and subacute infective endocarditis: Secondary | ICD-10-CM

## 2018-01-05 DIAGNOSIS — F0150 Vascular dementia without behavioral disturbance: Secondary | ICD-10-CM

## 2018-01-05 DIAGNOSIS — I359 Nonrheumatic aortic valve disorder, unspecified: Secondary | ICD-10-CM

## 2018-01-05 DIAGNOSIS — N179 Acute kidney failure, unspecified: Secondary | ICD-10-CM

## 2018-01-05 DIAGNOSIS — I6349 Cerebral infarction due to embolism of other cerebral artery: Secondary | ICD-10-CM

## 2018-01-05 DIAGNOSIS — F0280 Dementia in other diseases classified elsewhere without behavioral disturbance: Secondary | ICD-10-CM

## 2018-01-05 DIAGNOSIS — I669 Occlusion and stenosis of unspecified cerebral artery: Secondary | ICD-10-CM

## 2018-01-05 LAB — GLUCOSE, CAPILLARY
GLUCOSE-CAPILLARY: 125 mg/dL — AB (ref 70–99)
GLUCOSE-CAPILLARY: 151 mg/dL — AB (ref 70–99)
Glucose-Capillary: 157 mg/dL — ABNORMAL HIGH (ref 70–99)

## 2018-01-05 MED ORDER — GLYCOPYRROLATE 1 MG PO TABS: 1.0000 mg | ORAL_TABLET | ORAL | Status: DC | PRN

## 2018-01-05 MED ORDER — SODIUM CHLORIDE 0.9% FLUSH: 3.0000 mL | INTRAVENOUS | Status: DC | PRN

## 2018-01-05 MED ORDER — MORPHINE SULFATE (PF) 2 MG/ML IV SOLN
2.0000 mg | INTRAVENOUS | Status: DC | PRN
  Administered 2018-01-05 – 2018-01-06 (×5): 4 mg via INTRAVENOUS
  Filled 2018-01-05 (×5): qty 2

## 2018-01-05 MED ORDER — ENSURE ENLIVE PO LIQD
237.0000 mL | Freq: Two times a day (BID) | ORAL | Status: DC
  Administered 2018-01-05: 237 mL via ORAL

## 2018-01-05 MED ORDER — GLYCOPYRROLATE 0.2 MG/ML IJ SOLN: 0.2000 mg | INTRAMUSCULAR | Status: DC | PRN

## 2018-01-05 MED ORDER — VITAMIN B-1 100 MG PO TABS: 100.0000 mg | ORAL_TABLET | Freq: Every day | ORAL | Status: DC

## 2018-01-05 MED ORDER — POLYVINYL ALCOHOL 1.4 % OP SOLN
1.0000 [drp] | Freq: Four times a day (QID) | OPHTHALMIC | Status: DC | PRN
  Filled 2018-01-05: qty 15

## 2018-01-05 MED ORDER — LORAZEPAM 2 MG/ML IJ SOLN: 0.5000 mg | INTRAMUSCULAR | Status: DC | PRN

## 2018-01-05 MED ORDER — ACETAMINOPHEN 650 MG RE SUPP: 650.0000 mg | Freq: Four times a day (QID) | RECTAL | Status: DC | PRN

## 2018-01-05 MED ORDER — HALOPERIDOL LACTATE 2 MG/ML PO CONC
0.5000 mg | ORAL | Status: DC | PRN
  Filled 2018-01-05: qty 0.3

## 2018-01-05 MED ORDER — HALOPERIDOL LACTATE 5 MG/ML IJ SOLN: 0.5000 mg | INTRAMUSCULAR | Status: DC | PRN

## 2018-01-05 MED ORDER — SODIUM CHLORIDE 0.9 % IV SOLN
760.0000 mg | Freq: Every day | INTRAVENOUS | Status: DC
  Filled 2018-01-05: qty 15.2

## 2018-01-05 MED ORDER — HALOPERIDOL 0.5 MG PO TABS
0.5000 mg | ORAL_TABLET | ORAL | Status: DC | PRN
  Administered 2018-01-05: 0.5 mg via ORAL
  Filled 2018-01-05 (×2): qty 1

## 2018-01-05 MED ORDER — LOPERAMIDE HCL 2 MG PO CAPS
4.0000 mg | ORAL_CAPSULE | ORAL | Status: DC | PRN
  Administered 2018-01-05: 4 mg via ORAL
  Filled 2018-01-05: qty 2

## 2018-01-05 MED ORDER — SODIUM CHLORIDE 0.9 % IV SOLN: 250.0000 mL | INTRAVENOUS | Status: DC | PRN

## 2018-01-05 MED ORDER — SODIUM CHLORIDE 0.9% FLUSH
3.0000 mL | Freq: Two times a day (BID) | INTRAVENOUS | Status: DC
  Administered 2018-01-05: 3 mL via INTRAVENOUS

## 2018-01-05 MED ORDER — ACETAMINOPHEN 325 MG PO TABS: 650.0000 mg | ORAL_TABLET | Freq: Four times a day (QID) | ORAL | Status: DC | PRN

## 2018-01-05 MED ORDER — MORPHINE SULFATE (CONCENTRATE) 10 MG/0.5ML PO SOLN
5.0000 mg | Freq: Four times a day (QID) | ORAL | Status: DC
  Administered 2018-01-05 – 2018-01-06 (×4): 5 mg via ORAL
  Filled 2018-01-05 (×4): qty 0.5

## 2018-01-05 MED ORDER — BIOTENE DRY MOUTH MT LIQD: 15.0000 mL | OROMUCOSAL | Status: DC | PRN

## 2018-01-05 NOTE — Progress Notes (Signed)
Nutrition Follow-up  DOCUMENTATION CODES:   Obesity unspecified  INTERVENTION:   - Ensure Enlive po BID, each supplement provides 350 kcal and 20 grams of protein (chocolate flavor)  - Chocolate Hormel Shake daily with dinner, each supplement provides 520 kcal and 22 grams of protein  NUTRITION DIAGNOSIS:   Inadequate oral intake related to inability to eat, lethargy/confusion as evidenced by energy intake < or equal to 50% for > or equal to 5 days, NPO status.  Ongoing, however pt now on Dysphagia 1 diet with thin liquids  GOAL:   Patient will meet greater than or equal to 90% of their needs  Unmet at this time  MONITOR:   PO intake, Supplement acceptance, Diet advancement, Labs, Weight trends, Other (GOC)  REASON FOR ASSESSMENT:   Consult Assessment of nutrition requirement/status  ASSESSMENT:   [name] is a 77 yo male with PMH of aortic valve stenosis, s/p AVR (bovine valve), T2DM, dementia, lumbar DDD, HTN, HLD, depression, BPH, afib, GI bleeding who is admitted for acute metabolic ecephalopathy, suspected acute cholecystitis, MRSA bacteremia, AKI, CHF.  10/16 - NPO 10/21 - put on heart healthy diet in AM, PM NPO again   10/21 - swallow eval rec D1 w/ thins  10/23 - NPO for TEE showing possible perivalvular abscess 10/24 - D1 w/ thin liquids  MRI yesterday identified acute infarct in the right cerebellum and right occipital lobe, concerning for septic emboli/endocarditis. Noted therapies recommending SNF at this time.  Noted palliative care consult for discussion regarding GOC.  Spoke with pt at bedside. Pt states, "I'm exhausted." Pt is poor historian. Pt attempting to drink beverage from cup and obviously struggling to bring cup to mouth. RD offered a straw to pt, but pt declined and stated "I don't like straws."  Pt states that he was not eating well PTA, maybe 1 meal daily. Pt says, "I don't know" when asked what he may eat for a meal.  Pt willing and  excited to have an oral nutrition supplement. RD to order Ensure Enlive and Hormel Shake. Pt believes that he will be more likely to drink these vs eat at mealtimes. RD encouraged adequate PO intake.  RD asked about pt's weight history. Pt is unsure of his UBW and is not sure whether he has lost weight recently.  Medications reviewed and include: folic acid 1 mg daily, sliding scale Novolog, MVI with minerals daily, thiamine 100 mg daily, vitamin B-12 1000 mcg daily, IV antibiotics  Labs reviewed: 157, 140, 116, 134 x 24 hours  UOP: 1325 ml x 24 hours I/O's: +6.7 L since admit  Diet Order:   Diet Order            DIET - DYS 1 Room service appropriate? Yes; Fluid consistency: Thin  Diet effective now              EDUCATION NEEDS:   Not appropriate for education at this time  Skin: Skin Assessment: Reviewed RN Assessment (blister to right leg)  Last BM:  10/24 large type 6  Height:   Ht Readings from Last 1 Encounters:  12/29/17 6\' 1"  (1.854 m)    Weight:   Wt Readings from Last 1 Encounters:  01/04/18 114.9 kg    Ideal Body Weight:  84 kg  BMI:  Body mass index is 33.42 kg/m.  Estimated Nutritional Needs:   Kcal:  2200-2400  Protein:  110-120 grams  Fluid:  2.2 - 2.4 L    Earma Reading,  MS, RD, LDN Inpatient Clinical Dietitian Pager: [phone] Weekend/After Hours: [phone]

## 2018-01-05 NOTE — Progress Notes (Signed)
Pharmacy Antibiotic Note  Peter Weiss is a 77 y.o. male admitted on 12/28/2017 with MRSA bacteremia complicated by a perivalvular aortic valve abscess and septic emboli to the brain.  Pharmacy has been consulted for daptomycin dosing. Patient is currently  On D#7 of antibiotics from blood culture clearance. WBC are up to 13.7 and Tmax-100.1. Scr is up slightly today to 1.25.   Plan: Daptomycin 760 mg (~ 8 mg/kg AdjBW) Baseline and Weekly CK on Fridays Hold simvastatin while on Daptomycin therapy  Height: 6\' 1"  (185.4 cm) Weight: 253 lb 4.9 oz (114.9 kg) IBW/kg (Calculated) : 79.9  Temp (24hrs), Avg:98.7 F (37.1 C), Min:97.8 F (36.6 C), Max:100.1 F (37.8 C)  Recent Labs  Lab 12/29/17 2109 12/30/17 0812  12/31/17 1205  01/01/18 0347 01/01/18 1710 01/02/18 0531 01/03/18 0351 01/04/18 0533  WBC  --  8.6  --  11.1*  --  13.6*  --  12.5*  --  13.7*  CREATININE  --  1.51*   < >  --    < > 1.21 1.14 1.06 1.22 1.25*  LATICACIDVEN 1.4  --   --   --   --   --   --   --   --   --   VANCOTROUGH  --   --   --   --   --   --  9*  --   --  20   < > = values in this interval not displayed.    Estimated Creatinine Clearance: 67.8 mL/min (A) (by C-G formula based on SCr of 1.25 mg/dL (H)).    No Known Allergies  Antimicrobials this admission: Vanc 10/16>>10/24 Daptomycin 10/24>>   Thank you for allowing pharmacy to be a part of this patient's care.  Della Goo, PharmD, BCPS  Infectious Diseases Clinical Pharmacist Phone: [phone] 01/05/2018 2:31 PM

## 2018-01-05 NOTE — Progress Notes (Addendum)
CSW received referral regarding residential hospice placement. CSW sent referral to [facility] for review. Patient's sons at bedside.   Osborne Casco Sanav Remer LCSW [phone]

## 2018-01-05 NOTE — Progress Notes (Addendum)
STROKE TEAM PROGRESS NOTE   INTERVAL HISTORY His RN is at the bedside.  RN feels he is doing poorly and holding on for his kids to arrive. General appearance is poor. No change in neuro status. Pt states he wants to die, that he is a [id number]#$%-ing mess.  Vitals:   01/05/18 0050 01/05/18 0450 01/05/18 0509 01/05/18 0700  BP: (!) 159/82 (!) 142/77  140/70  Pulse:      Resp: (!) 30 (!) 39    Temp:   99.3 F (37.4 C) 100.1 F (37.8 C)  TempSrc:   Oral Axillary  SpO2:      Weight:      Height:        CBC:  Recent Labs  Lab 01/02/18 0531 01/04/18 0533  WBC 12.5* 13.7*  HGB 8.9* 10.1*  HCT 29.8* 32.7*  MCV 88.4 86.5  PLT 89* 65*    Basic Metabolic Panel:  Recent Labs  Lab 12/31/17 0607  01/03/18 0351 01/04/18 0533  NA 147*   < > 140 141  K 4.4   < > 4.2 4.3  CL 124*   < > 116* 116*  CO2 13*   < > 20* 16*  GLUCOSE 180*   < > 212* 151*  BUN 39*   < > 23 22  CREATININE 1.39*   < > 1.22 1.25*  CALCIUM 7.7*   < > 7.4* 7.5*  MG 2.2  --   --   --    < > = values in this interval not displayed.   Lipid Panel:     Component Value Date/Time   CHOL 127 12/29/2017 0254   CHOL 164 03/19/2014 1115   TRIG 87 12/29/2017 0254   TRIG 77 03/19/2014 1115   HDL 27 (L) 12/29/2017 0254   HDL 55 03/19/2014 1115   CHOLHDL 4.7 12/29/2017 0254   VLDL 17 12/29/2017 0254   VLDL 15 03/19/2014 1115   LDLCALC 83 12/29/2017 0254   LDLCALC 94 03/19/2014 1115   HgbA1c:  Lab Results  Component Value Date   HGBA1C 7.1 (H) 12/29/2017   Urine Drug Screen:     Component Value Date/Time   LABOPIA NONE DETECTED 12/29/2017 0003   COCAINSCRNUR NONE DETECTED 12/29/2017 0003   COCAINSCRNUR NEGATIVE 03/19/2014 1200   LABBENZ NONE DETECTED 12/29/2017 0003   AMPHETMU NONE DETECTED 12/29/2017 0003   THCU NONE DETECTED 12/29/2017 0003   LABBARB NONE DETECTED 12/29/2017 0003    Alcohol Level     Component Value Date/Time   ETH <10 07/07/2017 0140    IMAGING Ct Angio Head W Or Wo  Contrast  Result Date: 01/04/2018 CLINICAL DATA:  Focal neuro deficit. Dementia. Elevated white count. Sepsis with toxic metabolic encephalopathy. MRSA. Septic emboli to brain. EXAM: CT ANGIOGRAPHY HEAD AND NECK TECHNIQUE: Multidetector CT imaging of the head and neck was performed using the standard protocol during bolus administration of intravenous contrast. Multiplanar CT image reconstructions and MIPs were obtained to evaluate the vascular anatomy. Carotid stenosis measurements (when applicable) are obtained utilizing NASCET criteria, using the distal internal carotid diameter as the denominator. CONTRAST:  50mL ISOVUE-370 IOPAMIDOL (ISOVUE-370) INJECTION 76% COMPARISON:  MR brain 01/04/2018. FINDINGS: CTA NECK FINDINGS Aortic arch: Standard branching. Imaged portion shows no evidence of aneurysm or dissection. Heavily calcified great vessel origins without significant stenosis. Right carotid system: Heavily calcified plaque at the bifurcation, extending into the proximal ICA. Luminal measurements of 1.7/3.7 correspond to a stenosis slightly greater than 50%. This is not  clearly flow reducing. No ulceration, or dissection. Left carotid system: No evidence of dissection, stenosis (50% or greater) or occlusion. Vertebral arteries: Dominant LEFT vertebral. Heavily calcified ostial stenosis estimated 75%. Diminutive RIGHT vertebral, also with calcification its origin, supplies only PICA. Skeleton: Spondylosis. Poor dentition. No worrisome osseous lesion. Other neck: No masses. Upper chest: RIGHT upper lobe ill-defined opacity could represent pneumonia. Layering fluid along the LEFT major fissure. No pneumothorax. Review of the MIP images confirms the above findings CTA HEAD FINDINGS Anterior circulation: Calcific nonstenotic atheromatous change both carotid bifurcations. ICA termini widely patent. Dominant LEFT anterior cerebral, with diminutive hypoplastic or diseased RIGHT A1 ACA. Both anterior cerebral spill  from the LEFT. No M1 or M2 stenosis/occlusion. Posterior circulation: Heavily calcified LEFT vertebral, V4 segment. This vessel is the sole contributor to the basilar which is widely patent. Diminutive RIGHT vertebral contributes only to PICA which is patent. No cerebellar branch occlusion. No PCA stenosis or occlusion. Venous sinuses: Patent Anatomic variants: Balanced contribution to the LEFT PCA from carotid and basilar. Delayed phase: No abnormal postcontrast enhancement. Old LEFT frontal infarct. Areas of acute infarction are poorly visualized. Review of the MIP images confirms the above findings IMPRESSION: No intracranial mycotic aneurysm or large vessel occlusion related to embolic material. Dominant LEFT vertebral supplies the basilar. Diminutive RIGHT vertebral supplies only PICA. Both are diseased at their origin. Given this anatomy, the observed pattern of acute intracranial infarction affecting the RIGHT occipital lobe and RIGHT inferior cerebellum is most consistent with embolic infarcts, which may or may not be septic. Greater than 50% stenosis due to calcific plaque at the RIGHT carotid bifurcation, but not clearly flow reducing. No dissection. RIGHT upper lobe opacity, and LEFT pleural effusion, incompletely evaluated. CT chest could be helpful in further evaluation. No abnormal postcontrast enhancement of the brain or meninges is visible. Areas of acute infarction or poorly seen however. Electronically Signed   By: Elsie Stain M.D.   On: 01/04/2018 15:09   Ct Angio Neck W Or Wo Contrast  Result Date: 01/04/2018 CLINICAL DATA:  Focal neuro deficit. Dementia. Elevated white count. Sepsis with toxic metabolic encephalopathy. MRSA. Septic emboli to brain. EXAM: CT ANGIOGRAPHY HEAD AND NECK TECHNIQUE: Multidetector CT imaging of the head and neck was performed using the standard protocol during bolus administration of intravenous contrast. Multiplanar CT image reconstructions and MIPs were  obtained to evaluate the vascular anatomy. Carotid stenosis measurements (when applicable) are obtained utilizing NASCET criteria, using the distal internal carotid diameter as the denominator. CONTRAST:  50mL ISOVUE-370 IOPAMIDOL (ISOVUE-370) INJECTION 76% COMPARISON:  MR brain 01/04/2018. FINDINGS: CTA NECK FINDINGS Aortic arch: Standard branching. Imaged portion shows no evidence of aneurysm or dissection. Heavily calcified great vessel origins without significant stenosis. Right carotid system: Heavily calcified plaque at the bifurcation, extending into the proximal ICA. Luminal measurements of 1.7/3.7 correspond to a stenosis slightly greater than 50%. This is not clearly flow reducing. No ulceration, or dissection. Left carotid system: No evidence of dissection, stenosis (50% or greater) or occlusion. Vertebral arteries: Dominant LEFT vertebral. Heavily calcified ostial stenosis estimated 75%. Diminutive RIGHT vertebral, also with calcification its origin, supplies only PICA. Skeleton: Spondylosis. Poor dentition. No worrisome osseous lesion. Other neck: No masses. Upper chest: RIGHT upper lobe ill-defined opacity could represent pneumonia. Layering fluid along the LEFT major fissure. No pneumothorax. Review of the MIP images confirms the above findings CTA HEAD FINDINGS Anterior circulation: Calcific nonstenotic atheromatous change both carotid bifurcations. ICA termini widely patent. Dominant LEFT anterior cerebral,  with diminutive hypoplastic or diseased RIGHT A1 ACA. Both anterior cerebral spill from the LEFT. No M1 or M2 stenosis/occlusion. Posterior circulation: Heavily calcified LEFT vertebral, V4 segment. This vessel is the sole contributor to the basilar which is widely patent. Diminutive RIGHT vertebral contributes only to PICA which is patent. No cerebellar branch occlusion. No PCA stenosis or occlusion. Venous sinuses: Patent Anatomic variants: Balanced contribution to the LEFT PCA from carotid and  basilar. Delayed phase: No abnormal postcontrast enhancement. Old LEFT frontal infarct. Areas of acute infarction are poorly visualized. Review of the MIP images confirms the above findings IMPRESSION: No intracranial mycotic aneurysm or large vessel occlusion related to embolic material. Dominant LEFT vertebral supplies the basilar. Diminutive RIGHT vertebral supplies only PICA. Both are diseased at their origin. Given this anatomy, the observed pattern of acute intracranial infarction affecting the RIGHT occipital lobe and RIGHT inferior cerebellum is most consistent with embolic infarcts, which may or may not be septic. Greater than 50% stenosis due to calcific plaque at the RIGHT carotid bifurcation, but not clearly flow reducing. No dissection. RIGHT upper lobe opacity, and LEFT pleural effusion, incompletely evaluated. CT chest could be helpful in further evaluation. No abnormal postcontrast enhancement of the brain or meninges is visible. Areas of acute infarction or poorly seen however. Electronically Signed   By: Elsie Stain M.D.   On: 01/04/2018 15:09   Mr Brain Wo Contrast  Result Date: 01/04/2018 CLINICAL DATA:  Altered mental status EXAM: MRI HEAD WITHOUT CONTRAST TECHNIQUE: Multiplanar, multiecho pulse sequences of the brain and surrounding structures were obtained without intravenous contrast. COMPARISON:  Head CT 12/29/2017 FINDINGS: BRAIN: There are multiple punctate foci of abnormal diffusion restriction within the right cerebellum and posterior right occipital lobe. Partially empty sella. There is no acute hemorrhage. There are multiple old cerebellar infarcts and old left frontal lobe infarct. Minimal white matter hyperintensity, nonspecific and commonly seen in asymptomatic patients of this age. Periventricular white matter hyperintensity. Generalized atrophy without lobar predilection. Single focus of chronic microhemorrhage in the right frontal lobe. VASCULAR: Major intracranial  arterial and venous sinus flow voids are normal. Right vertebral artery probably terminates in PICA. SKULL AND UPPER CERVICAL SPINE: Calvarial bone marrow signal is normal. There is no skull base mass. Visualized upper cervical spine and soft tissues are normal. SINUSES/ORBITS: No fluid levels or advanced mucosal thickening. No mastoid or middle ear effusion. The orbits are normal. IMPRESSION: 1. Multiple pole intake foci of acute ischemia within the right cerebellum and right occipital lobe in a pattern most suggestive of embolic etiology. 2. Old left frontal lobe and old bilateral cerebellar infarcts. 3. No hemorrhage or mass effect. Electronically Signed   By: Deatra Robinson M.D.   On: 01/04/2018 01:45   2D Echocardiogram  - Very technically difficult study. Compared to a prior study in 2016, there are no significant changes. The bioprosthetic aortic valve gradient is stable - no obvious large vegetation was noted on the valve, however, visualization of the valve is limited.   TEE No evidence of vegetation on any valve. However, area around bioprosthetic aortic valve appears thickened, with interior of tissue containing differing densities of echolucency. 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 No apparent rocking or dehiscence of valve, no paravalvular leak, trivial central aortic regurgitation. No LA/LAA or RA/RAA thrombi/masses noted. Pleural effusion noted. Study suggests abscess around bioprosthetic aortic valve.  EEG This is an abnormal EEG secondary to general background slowing.  This finding may be seen with a diffuse  disturbance that is etiologically nonspecific, but may include a metabolic encephalopathy, among other possibilities.  No epileptiform activity was noted.     PHYSICAL EXAM Constitutional: Appears pale, tired, but well-developed and well-nourished.  Psych: flat Affect  Eyes: No scleral injection HENT: No OP obstrucion Head: Normocephalic.  Cardiovascular: Irregular  irregular rhythm Respiratory: Effort normal, non-labored breathing Skin: WDI  Neuro: Mental Status: Patient is extremely lethargic, will follow a few commands then tires and asks why he has to answer questions or do what I ask Cranial Nerves: II: Blinks to threat bilaterally.    III,IV, VI: EOMI without ptosis or diploplia.  Pupils are equal, round, and reactive to light. V: Facial sensation is symmetric to temperature VII: Facial movement is symmetric.  Motor: Moving all extremities - will hold L arm in the air, but hits the bed in less than 5 secs. Will grip bilaterally, strength appears equal. Pain in bilateral legs limits strength testing, though will move both to command and touch. Appears equal. Sensory: Intact bilaterally Plantars: Toes are downgoing bilaterally.  Cerebellar: FNF intact bilaterally  ASSESSMENT/PLAN [name] is a 77 y.o. male with history of aortic stenosis s/p replacement, HTN, HLD, DB,  AF, CHF, GIB, dementia, ETOH and THC use who was admitted 1 week ago for latered mental status and dementia. Found to have toxic metabolic encephalopathy secondary to MRSA bacteremia w/ likely septic emboli to the brain. TEE negative for vegetations but with aortic value absess. Not a surgical candidate. generally poor medical condition.   Stroke:  right cerebellar and R occipital lobe infarcts embolic, most likely cardioembolic secondary to septic embolic, however cannot rule out AF source  CT head no acute stroke. Stable pituitary mass. Old L frontal infarct. Small vessel disease. Atrophy.  MRI  Mult R cerebellum and R occipital lobe infarcts. Old L frontal and old B cerebellar infarcts.   CTA head & neck no mycotic aneurysm or LVO. B VA disease at origin. R ICA >50% stenosis at bifurcation. RUL opacity, L pleural effusion. No meningeal enhancement. Acute infarcts poorly seen.  2D Echo  EF 65-70%. Bioprosthetic AV, cannot exclude vegetation. No obvious  source of embolus   TEE no vegetations. No endocarditis. Thickened tissue around AV c/w aortic valve abscess  EEG slowing  LDL 83  HgbA1c 7.1  SCDs for VTE prophylaxis  No antithrombotic prior to admission, now on No antithrombotic given risk of bleeing, low PLTS. May consider aspirin 81 mg in the future.  Therapy recommendations:  SNF  Disposition:  pending   Overall, poor medical condition. Pt wants to die. Palliative consult seems reasonable.  Paroxsymal Atrial Fibrillation w/ RVR  S/p MAZE and LAA clipping   Home anticoagulation:  none   Not an AC candidate per Dr. Mariah Milling given hx GIB requiring transfusions 2016  Carotid stenosis  > 50% R ICA stenosis  Likely incidental finding  VVS follow up when stable, can do as OP  Hypertension  Stable today, has been variable  Given carotid stenosis, recommend BP goal 120-150 now and long-term  Hyperlipidemia  Home meds:  zocor 20, resumed in hospital  LDL 83, goal < 70  Continue statin at discharge  Diabetes type II  HgbA1c 7.1, goal < 7.0  Uncontrolled  Other Stroke Risk Factors  Advanced age  ETOH use, advised to drink no more than 2 drink(s) a day  Hx THC use, UDS negative on admission  Obesity, Body mass index is 33.42 kg/m., recommend weight loss, diet  and exercise as appropriate   Hx stroke/TIA on imagin  Congestive heart failure  Other Active Problems  Baseline dementia  Prolonged QTC  B leg mottling  Anemia  Thrombocytopenia 89  Dysphagia  BPH  Stable pituitary mass  Severe debility, deconditioning, FTT  AKI  Hospital day # 7  Annie Main, MSN, APRN, ANVP-BC, AGPCNP-BC Advanced Practice Stroke Nurse [facility] See Amion for Schedule & Pager information 01/05/2018 11:50 AM   ATTENDING NOTE: I reviewed above note and agree with the assessment and plan. Pt was seen and examined.   77 year old male with history of atrial fibrillation status post maze  procedure and LAA clipping not on anticoagulation due to GI bleeding requiring transfusion, aortic stenosis status post AVR, hypertension, hyperlipidemia, CHF, diabetes, AAA, dementia presented with high-grade fever, tachycardia, altered mental status, sepsis with septic shock and encephalopathy.  Found to have MRSA bacteremia and TEE showed AV abscess.  MRI showed right cerebellum and right MCA/PCA punctate infarct, embolic pattern.  CTA head and neck showed right ICA high-grade stenosis, right VA origin stenosis and right VA terminate at the PICA.  EF 65 to 70%, HIV negative, ammonia level lactic.  UDS negative.  Creatinine 1.25.  LDL 83 and A1c 7.1.  EEG showed no seizure.  Currently he is on vancomycin and rifampin for endocarditis treatment, also on metoprolol for rate control.  However, blood pressure overnight still soft on the lower side.  Patient stroke, embolic pattern, likely due to septic emboli or AF not on anticoagulation.  Given his multiple comorbidities, multiple organ failure, acute infection, history of GI bleeding required transfusion, no antiplatelet or anticoagulation recommended at this time.  Fort right ICA high-grade stenosis, recommend to avoid low BP, and follow-up with vascular surgery as outpatient, and may consider antiplatelet at that time.  Meet patient 2 sons are at bedside.  They stated that CT surgery consulted and no surgery recommended.  Palliative care also involved and patient is going to residential hospice today.  Neurology will sign off. Please call with questions. Thanks for the consult.  Marvel Plan, MD PhD Stroke Neurology 01/05/2018 8:15 PM    To contact Stroke Continuity provider, please refer to [email or web address]. After hours, contact General Neurology

## 2018-01-05 NOTE — Progress Notes (Signed)
  Speech Language Pathology Treatment: Dysphagia  Patient Details Name: Peter Weiss MRN: [id number] DOB: 02/17/1941 Today's Date: 01/05/2018 Time: 0900-0920 SLP Time Calculation (min) (ACUTE ONLY): 20 min  Assessment / Plan / Recommendation Clinical Impression  Pt seen at bedside for assessment of diet tolerance and appropriateness to advance solid consistencies. Chart review reveals MRI yesterday iudentified acute infarct in the right cerebellum and right occipital lobe, concerning for septic emboli/endocarditis. TEE completed raises suspicion of endocarditis and perivalvular abscess. Pt appears pale today, flat affect. He reports being exhausted, and feeling terrible, but does not endorse pain at this time. Following oral care, pt was given trials of water and soft fruit. Pt exhibited extended oral prep of fruit, and eventually spit it out. No overt difficulty with sips of water via straw. At this time, continued puree diet and thin liquids is recommended, primarily for energy conservation given significant weakness and bedside presentation. SLP will continue to follow to assess improvement and readiness to advance solids.    HPI HPI: 77 year old male reportedly lives at an extended stay hotel off of [address] [facility] reportedly independent with his ADLs at baseline (normally alert and oriented and walks his dog daily), PMH of aortic valve stenosis, status post AVR (bovine valve), A. fib not on anticoagulation, chronic diastolic CHF, DM 2, dementia, lumbar DDD, AAA, HTN, HLD, depression, BPH, GI bleeding, alcohol use, EMS was called out by security who had to use forced entry to enter patient's room where he was found in bed covered in urine and brought to ED for altered mental status. Found to have MRSA bacteremia.      SLP Plan  Continue with current plan of care       Recommendations  Diet recommendations: Dysphagia 1 (puree);Thin liquid Liquids provided via:  Cup;Straw Medication Administration: Crushed with puree Supervision: Patient able to self feed;Staff to assist with self feeding Compensations: Slow rate;Small sips/bites;Minimize environmental distractions Postural Changes and/or Swallow Maneuvers: Seated upright 90 degrees                Oral Care Recommendations: Oral care BID Follow up Recommendations: None SLP Visit Diagnosis: Dysphagia, oral phase (R13.11) Plan: Continue with current plan of care       GO               Celia B. Murvin [facility], CCC-SLP Speech Language Pathologist [phone]  Leigh [place] 01/05/2018, 9:22 AM

## 2018-01-05 NOTE — Progress Notes (Signed)
PROGRESS NOTE        PATIENT DETAILS Name: Peter Weiss Age: 77 y.o. Sex: male Date of Birth: 10/09/1940 Admit Date: 12/28/2017 Admitting Physician Lorretta Harp, MD [id number], Dorma Russell, MD  Brief Narrative: Patient is a 77 y.o. male with history of bioprosthetic AVR, chronic diastolic heart failure, A. fib not on anticoagulation, dementia presented with sepsis along with acute metabolic encephalopathy-secondary to MRSA bacteremia with septic emboli to the brain.  Work-up in progress-TEE scheduled for 10/23.  See below for further details.  Subjective: Appears very lethargic-but awake-follows commands.  Moves all 4 extremities but appears very weak.  Assessment/Plan: Sepsis secondary to MRSA bacteremia with perivalvular abscess surrounding bioprosthetic aortic valve and septic emboli to the brain: Sepsis pathophysiology has improved-continues to have intermittent fever-continue with IV Rocephin and rifampin.  TTE without any obvious vegetations, TEE showed a perivalvular abscess around the bioprosthetic valve.  Seen by cardiothoracic surgery-not a candidate for redo aortic valve replacement.  Continue IV vancomycin and rifampin-ID following.  His overall prognosis is very poor hence palliative care consultation has also been placed.  Will need SNF on discharge-ID recommending 6 weeks of IV antimicrobial therapy.  Acute metabolic encephalopathy: Likely secondary to above-continues to have waxing and waning confusion.  Does have dementia per outpatient notes.  MRI brain on 10/22 confirms numerous small ischemic foci to the right cerebellum and right occipital lobe-likely reflecting septic emboli.  EEG without any seizures.  Vitamin B12/ammonia levels within normal limits. Continue supportive care.  Neurology following.  Acute embolic CVA: Likely septic emboli to the brain in the setting of MRSA bacteremia and aortic valve endocarditis-this was seen on MRI brain on  10/22.  No mycotic aneurysm seen on CT angiogram of the head.  Neurology/stroke team following  PAF with RVR: Rate controlled-continue metoprolol-not a candidate for anticoagulation per prior notes due to history of GI bleed.  Chads2vasc score is 5.  Elevated troponin: Likely secondary to demand ischemia-no further recommendations from cardiology.  History of bioprosthetic aortic valve replacement: See above-not a candidate for redo aortic valve replacement.  Hypertension: Blood pressure controlled-continue metoprolol.    Prolonged NUU:VOZD have a right bundle branch-so not sure if QTC is accurate in this setting.  Monitor periodically.  DM-2: CBG stable-continue SSI.  Dyslipidemia: Continue Zocor.  Bilateral leg mottling: CTA without any major stenosis-thought to be secondary to sepsis-no further recommendations from vascular surgery.  Mottling continues but appears mild on my exam.  Anemia: Likely secondary to acute illness-hemoglobin is stable-follow  Thrombocytopenia: Probably related to sepsis-follow.  Dysphagia: Followed by speech therapy-on dysphagia 1 diet.  BPH: Continue Flomax  Stable mass within the pituitary fossa: Noted on CT head 10/17.  However MRI and brain showed partially empty sella-no further work-up required.  Dementia  Severe debility/deconditionin/failure to thrive syndrome: Likely secondary to acute illness-PT following-however albumin is 1.8!  Poor prognosis.  Will need SNF on discharge-if deteriorates-may be best served by transitioning to full comfort measures.  Palliative care evaluation pending.  DVT Prophylaxis: SCD's  Code Status: Full code  Family Communication: Spoke with son Onalee Hua yesterday over the phone-palliative care planning on family conference before the patient's son leaves the country.  Disposition Plan: Remain inpatient-SNF over the next few days.  Antimicrobial agents: Anti-infectives (From admission, onward)   Start      Dose/Rate Route Frequency Ordered Stop  01/04/18 1800  vancomycin (VANCOCIN) IVPB 750 mg/150 ml premix     750 mg 150 mL/hr over 60 Minutes Intravenous Every 12 hours 01/04/18 1316     01/02/18 0600  vancomycin (VANCOCIN) IVPB 1000 mg/200 mL premix  Status:  Discontinued     1,000 mg 200 mL/hr over 60 Minutes Intravenous Every 12 hours 01/01/18 1833 01/04/18 1316   12/30/17 2200  rifampin (RIFADIN) 300 mg in sodium chloride 0.9 % 100 mL IVPB     300 mg 200 mL/hr over 30 Minutes Intravenous Every 12 hours 12/30/17 1709     12/29/17 1800  vancomycin (VANCOCIN) 1,250 mg in sodium chloride 0.9 % 250 mL IVPB     1,250 mg 166.7 mL/hr over 90 Minutes Intravenous Every 24 hours 12/29/17 1115 01/01/18 2001   12/29/17 0300  piperacillin-tazobactam (ZOSYN) IVPB 3.375 g  Status:  Discontinued     3.375 g 12.5 mL/hr over 240 Minutes Intravenous Every 8 hours 12/29/17 0233 12/29/17 1653   12/29/17 0145  metroNIDAZOLE (FLAGYL) IVPB 500 mg  Status:  Discontinued     500 mg 100 mL/hr over 60 Minutes Intravenous  Once 12/29/17 0135 12/29/17 0224   12/28/17 2300  ceFEPIme (MAXIPIME) 2 g in sodium chloride 0.9 % 100 mL IVPB     2 g 200 mL/hr over 30 Minutes Intravenous STAT 12/28/17 2257 12/29/17 0010   12/28/17 2300  vancomycin (VANCOCIN) 1,500 mg in sodium chloride 0.9 % 500 mL IVPB     1,500 mg 250 mL/hr over 120 Minutes Intravenous STAT 12/28/17 2258 12/29/17 0308      Procedures: 10/23>> TEE:  No evidence of endocarditis. Thickened tissue surrounding aortic   valve with heterogeneous echolucency consistent with aortic valve   abscess.  10/18>> TTE: - Very technically difficult study. Compared to a prior study in   2016, there are no significant changes. The bioprosthetic aortic   valve gradient is stable - no obvious large vegetation was noted   on the valve, however, visualization of the valve is limited.   Suggest TEE if clinical concern for bioprosthetic valve   endocarditis is  high.  CONSULTS: vvs Cards [facility] ID Neuro CTVS  Time spent: 25 minutes-Greater than 50% of this time was spent in counseling, explanation of diagnosis, planning of further management, and coordination of care.  MEDICATIONS: Scheduled Meds: . feeding supplement (ENSURE ENLIVE)  237 mL Oral BID BM  . folic acid  1 mg Oral Daily  . insulin aspart  0-5 Units Subcutaneous QHS  . insulin aspart  0-9 Units Subcutaneous TID WC  . metoprolol tartrate  37.5 mg Oral BID  . multivitamin with minerals  1 tablet Oral Daily  . simvastatin  20 mg Oral QHS  . tamsulosin  0.4 mg Oral Daily  . thiamine injection  100 mg Intravenous Daily  . vitamin B-12  1,000 mcg Oral Daily   Continuous Infusions: . rifampin (RIFADIN) IVPB 300 mg (01/05/18 1248)  . vancomycin Stopped (01/05/18 0605)   PRN Meds:.acetaminophen **OR** acetaminophen, hydrALAZINE, ondansetron **OR** ondansetron (ZOFRAN) IV   PHYSICAL EXAM: Vital signs: Vitals:   01/05/18 0509 01/05/18 0700 01/05/18 1032 01/05/18 1311  BP:  140/70  (!) 120/48  Pulse:    97  Resp:    (!) 32  Temp: 99.3 F (37.4 C) 100.1 F (37.8 C) 98.2 F (36.8 C) 98 F (36.7 C)  TempSrc: Oral Axillary Oral Axillary  SpO2:    99%  Weight:      Height:  Filed Weights   12/28/17 2225 12/29/17 1300 01/04/18 1638  Weight: 114.6 kg 115 kg 114.9 kg   Body mass index is 33.42 kg/m.   General appearance:Awake, confused-lethargic this morning. Eyes:no scleral icterus. Neck: supple, no JVD. Resp:Good air entry bilaterally,no rales heard anteriorly CVS: S1 S2 regular, no murmurs.  GI: Bowel sounds present, Non tender and not distended with no gaurding, rigidity or rebound. Extremities: B/L Lower Ext shows + edema, both legs are warm to touch Neurology:  Non focal but with severe generalized weakness. Skin:No Rash, warm and dry Wounds:N/A  I have personally reviewed following labs and imaging studies  LABORATORY DATA: CBC: Recent Labs  Lab  12/30/17 0812 12/31/17 1205 01/01/18 0347 01/02/18 0531 01/04/18 0533  WBC 8.6 11.1* 13.6* 12.5* 13.7*  HGB 7.8* 8.8* 8.8* 8.9* 10.1*  HCT 25.5* 28.8* 29.7* 29.8* 32.7*  MCV 89.5 89.7 88.4 88.4 86.5  PLT 78* 91* 94* 89* 65*    Basic Metabolic Panel: Recent Labs  Lab 12/31/17 0607  01/01/18 0347 01/01/18 1710 01/02/18 0531 01/03/18 0351 01/04/18 0533  NA 147*   < > 147* 144 145 140 141  K 4.4   < > 3.8 2.9* 3.2* 4.2 4.3  CL 124*   < > 122* 123* 120* 116* 116*  CO2 13*   < > 18* 20* 19* 20* 16*  GLUCOSE 180*   < > 223* 220* 195* 212* 151*  BUN 39*   < > 38* 32* 26* 23 22  CREATININE 1.39*   < > 1.21 1.14 1.06 1.22 1.25*  CALCIUM 7.7*   < > 8.0* 7.8* 7.8* 7.4* 7.5*  MG 2.2  --   --   --   --   --   --    < > = values in this interval not displayed.    GFR: Estimated Creatinine Clearance: 67.8 mL/min (A) (by C-G formula based on SCr of 1.25 mg/dL (H)).  Liver Function Tests: Recent Labs  Lab 12/30/17 0812 12/31/17 0607 01/02/18 0531 01/04/18 0533  AST 26 45* 20 24  ALT 15 23 18 14   ALKPHOS 34* 71 70 65  BILITOT 1.0 2.9* 2.8* 2.3*  PROT 5.3* 5.7* 5.3* 5.0*  ALBUMIN 2.3* 2.4* 2.0* 1.8*   No results for input(s): LIPASE, AMYLASE in the last 168 hours. Recent Labs  Lab 12/29/17 1559 01/02/18 0531  AMMONIA 14 18    Coagulation Profile: No results for input(s): INR, PROTIME in the last 168 hours.  Cardiac Enzymes: Recent Labs  Lab 12/29/17 1559 01/03/18 0351  CKTOTAL  --  <5*  TROPONINI 0.18*  --     BNP (last 3 results) No results for input(s): PROBNP in the last 8760 hours.  HbA1C: No results for input(s): HGBA1C in the last 72 hours.  CBG: Recent Labs  Lab 01/04/18 1345 01/04/18 1744 01/04/18 2250 01/05/18 0813 01/05/18 1220  GLUCAP 134* 116* 140* 157* 125*    Lipid Profile: No results for input(s): CHOL, HDL, LDLCALC, TRIG, CHOLHDL, LDLDIRECT in the last 72 hours.  Thyroid Function Tests: Recent Labs    01/03/18 1910  TSH 1.536     Anemia Panel: Recent Labs    01/03/18 1910  VITAMINB12 2,945*    Urine analysis:    Component Value Date/Time   COLORURINE AMBER (A) 12/29/2017 0003   APPEARANCEUR CLOUDY (A) 12/29/2017 0003   APPEARANCEUR Clear 03/19/2014 1200   LABSPEC 1.021 12/29/2017 0003   LABSPEC 1.009 03/19/2014 1200   PHURINE 5.0 12/29/2017 0003  GLUCOSEU 50 (A) 12/29/2017 0003   GLUCOSEU Negative 03/19/2014 1200   HGBUR LARGE (A) 12/29/2017 0003   BILIRUBINUR NEGATIVE 12/29/2017 0003   BILIRUBINUR Negative 03/19/2014 1200   KETONESUR 20 (A) 12/29/2017 0003   PROTEINUR 100 (A) 12/29/2017 0003   UROBILINOGEN 0.2 07/04/2014 1003   NITRITE NEGATIVE 12/29/2017 0003   LEUKOCYTESUR NEGATIVE 12/29/2017 0003   LEUKOCYTESUR Negative 03/19/2014 1200    Sepsis Labs: Lactic Acid, Venous    Component Value Date/Time   LATICACIDVEN 1.4 12/29/2017 2109    MICROBIOLOGY: Recent Results (from the past 240 hour(s))  Blood Culture (routine x 2)     Status: Abnormal   Collection Time: 12/28/17 10:32 PM  Result Value Ref Range Status   Specimen Description BLOOD RIGHT ANTECUBITAL  Final   Special Requests   Final    BOTTLES DRAWN AEROBIC AND ANAEROBIC Blood Culture results may not be optimal due to an excessive volume of blood received in culture bottles   Culture  Setup Time   Final    GRAM POSITIVE COCCI IN BOTH AEROBIC AND ANAEROBIC BOTTLES CRITICAL RESULT CALLED TO, READ BACK BY AND VERIFIED WITH: Antoine Primas PharmD 10:45 12/29/17 ([email or web address]) Performed at [facility], [address] [place], [place] [address]    Culture METHICILLIN RESISTANT STAPHYLOCOCCUS AUREUS (A)  Final   Report Status 12/31/2017 FINAL  Final   Organism ID, Bacteria METHICILLIN RESISTANT STAPHYLOCOCCUS AUREUS  Final      Susceptibility   Methicillin resistant staphylococcus aureus - MIC*    CIPROFLOXACIN <=0.5 SENSITIVE Sensitive     ERYTHROMYCIN >=8 RESISTANT Resistant     GENTAMICIN <=0.5 SENSITIVE Sensitive      OXACILLIN RESISTANT Resistant     TETRACYCLINE <=1 SENSITIVE Sensitive     VANCOMYCIN <=0.5 SENSITIVE Sensitive     TRIMETH/SULFA <=10 SENSITIVE Sensitive     CLINDAMYCIN >=8 RESISTANT Resistant     RIFAMPIN <=0.5 SENSITIVE Sensitive     Inducible Clindamycin NEGATIVE Sensitive     * METHICILLIN RESISTANT STAPHYLOCOCCUS AUREUS  Blood Culture ID Panel (Reflexed)     Status: Abnormal   Collection Time: 12/28/17 10:32 PM  Result Value Ref Range Status   Enterococcus species NOT DETECTED NOT DETECTED Final   Listeria monocytogenes NOT DETECTED NOT DETECTED Final   Staphylococcus species DETECTED (A) NOT DETECTED Final    Comment: CRITICAL RESULT CALLED TO, READ BACK BY AND VERIFIED WITH: Antoine Primas PharmD 10:45 12/29/17 ([email or web address])    Staphylococcus aureus (BCID) DETECTED (A) NOT DETECTED Final    Comment: Methicillin (oxacillin)-resistant Staphylococcus aureus (MRSA). MRSA is predictably resistant to beta-lactam antibiotics (except ceftaroline). Preferred therapy is vancomycin unless clinically contraindicated. Patient requires contact precautions if  hospitalized. CRITICAL RESULT CALLED TO, READ BACK BY AND VERIFIED WITH: Antoine Primas PharmD 10:45 12/29/17 ([email or web address])    Methicillin resistance DETECTED (A) NOT DETECTED Final    Comment: CRITICAL RESULT CALLED TO, READ BACK BY AND VERIFIED WITH: Antoine Primas PharmD 10:45 12/29/17 ([email or web address])    Streptococcus species NOT DETECTED NOT DETECTED Final   Streptococcus agalactiae NOT DETECTED NOT DETECTED Final   Streptococcus pneumoniae NOT DETECTED NOT DETECTED Final   Streptococcus pyogenes NOT DETECTED NOT DETECTED Final   Acinetobacter baumannii NOT DETECTED NOT DETECTED Final   Enterobacteriaceae species NOT DETECTED NOT DETECTED Final   Enterobacter cloacae complex NOT DETECTED NOT DETECTED Final   Escherichia coli NOT DETECTED NOT DETECTED Final   Klebsiella oxytoca NOT DETECTED NOT DETECTED Final   Klebsiella pneumoniae NOT DETECTED NOT  DETECTED Final   Proteus species NOT DETECTED NOT DETECTED Final   Serratia marcescens NOT DETECTED NOT DETECTED Final   Haemophilus influenzae NOT DETECTED NOT DETECTED Final   Neisseria meningitidis NOT DETECTED NOT DETECTED Final   Pseudomonas aeruginosa NOT DETECTED NOT DETECTED Final   Candida albicans NOT DETECTED NOT DETECTED Final   Candida glabrata NOT DETECTED NOT DETECTED Final   Candida krusei NOT DETECTED NOT DETECTED Final   Candida parapsilosis NOT DETECTED NOT DETECTED Final   Candida tropicalis NOT DETECTED NOT DETECTED Final    Comment: Performed at [facility], [address] [place], [place] [address]  Blood Culture (routine x 2)     Status: Abnormal   Collection Time: 12/28/17 11:18 PM  Result Value Ref Range Status   Specimen Description BLOOD RIGHT HAND  Final   Special Requests   Final    BOTTLES DRAWN AEROBIC AND ANAEROBIC Blood Culture results may not be optimal due to an inadequate volume of blood received in culture bottles   Culture  Setup Time   Final    GRAM POSITIVE COCCI IN CLUSTERS IN BOTH AEROBIC AND ANAEROBIC BOTTLES CRITICAL RESULT CALLED TO, READ BACK BY AND VERIFIED WITH: Antoine Primas PharmD 10:45 12/29/17 ([email or web address])    Culture (A)  Final    STAPHYLOCOCCUS AUREUS SUSCEPTIBILITIES PERFORMED ON PREVIOUS CULTURE WITHIN THE LAST 5 DAYS. Performed at [facility], [address] [place], [place] [address]    Report Status 12/31/2017 FINAL  Final  Urine culture     Status: Abnormal   Collection Time: 12/29/17 12:03 AM  Result Value Ref Range Status   Specimen Description URINE, RANDOM  Final   Special Requests   Final    Immunocompromised Performed at [facility], [address], [place], [place] [address]    Culture (A)  Final    20,000 COLONIES/mL ENTEROCOCCUS FAECALIS 10,000 COLONIES/mL STAPHYLOCOCCUS AUREUS METHICILLIN RESISTANT STAPHYLOCOCCUS AUREUS    Report Status 01/01/2018 FINAL  Final   Organism ID, Bacteria  ENTEROCOCCUS FAECALIS (A)  Final   Organism ID, Bacteria STAPHYLOCOCCUS AUREUS (A)  Final      Susceptibility   Enterococcus faecalis - MIC*    AMPICILLIN <=2 SENSITIVE Sensitive     LEVOFLOXACIN 1 SENSITIVE Sensitive     NITROFURANTOIN <=16 SENSITIVE Sensitive     VANCOMYCIN 2 SENSITIVE Sensitive     * 20,000 COLONIES/mL ENTEROCOCCUS FAECALIS   Staphylococcus aureus - MIC*    CIPROFLOXACIN <=0.5 SENSITIVE Sensitive     GENTAMICIN <=0.5 SENSITIVE Sensitive     NITROFURANTOIN <=16 SENSITIVE Sensitive     OXACILLIN >=4 RESISTANT Resistant     TETRACYCLINE <=1 SENSITIVE Sensitive     VANCOMYCIN 1 SENSITIVE Sensitive     TRIMETH/SULFA <=10 SENSITIVE Sensitive     CLINDAMYCIN >=8 RESISTANT Resistant     RIFAMPIN <=0.5 SENSITIVE Sensitive     Inducible Clindamycin NEGATIVE Sensitive     * 10,000 COLONIES/mL STAPHYLOCOCCUS AUREUS  Culture, blood (routine x 2)     Status: None   Collection Time: 12/30/17  8:12 AM  Result Value Ref Range Status   Specimen Description BLOOD LEFT ANTECUBITAL  Final   Special Requests   Final    BOTTLES DRAWN AEROBIC ONLY Blood Culture adequate volume   Culture   Final    NO GROWTH 5 DAYS Performed at [facility], [address] [place], [place] [address]    Report Status 01/04/2018 FINAL  Final  Culture, blood (routine x 2)     Status: None   Collection Time: 12/30/17  8:15 AM  Result Value Ref Range Status   Specimen Description BLOOD BLOOD LEFT HAND  Final   Special Requests   Final    BOTTLES DRAWN AEROBIC ONLY Blood Culture adequate volume   Culture   Final    NO GROWTH 5 DAYS Performed at [facility], [address] [place], [place] [address]    Report Status 01/04/2018 FINAL  Final    RADIOLOGY STUDIES/RESULTS: Ct Angio Head W Or Wo Contrast  Result Date: 01/04/2018 CLINICAL DATA:  Focal neuro deficit. Dementia. Elevated white count. Sepsis with toxic metabolic encephalopathy. MRSA. Septic emboli to brain. EXAM: CT  ANGIOGRAPHY HEAD AND NECK TECHNIQUE: Multidetector CT imaging of the head and neck was performed using the standard protocol during bolus administration of intravenous contrast. Multiplanar CT image reconstructions and MIPs were obtained to evaluate the vascular anatomy. Carotid stenosis measurements (when applicable) are obtained utilizing NASCET criteria, using the distal internal carotid diameter as the denominator. CONTRAST:  50mL ISOVUE-370 IOPAMIDOL (ISOVUE-370) INJECTION 76% COMPARISON:  MR brain 01/04/2018. FINDINGS: CTA NECK FINDINGS Aortic arch: Standard branching. Imaged portion shows no evidence of aneurysm or dissection. Heavily calcified great vessel origins without significant stenosis. Right carotid system: Heavily calcified plaque at the bifurcation, extending into the proximal ICA. Luminal measurements of 1.7/3.7 correspond to a stenosis slightly greater than 50%. This is not clearly flow reducing. No ulceration, or dissection. Left carotid system: No evidence of dissection, stenosis (50% or greater) or occlusion. Vertebral arteries: Dominant LEFT vertebral. Heavily calcified ostial stenosis estimated 75%. Diminutive RIGHT vertebral, also with calcification its origin, supplies only PICA. Skeleton: Spondylosis. Poor dentition. No worrisome osseous lesion. Other neck: No masses. Upper chest: RIGHT upper lobe ill-defined opacity could represent pneumonia. Layering fluid along the LEFT major fissure. No pneumothorax. Review of the MIP images confirms the above findings CTA HEAD FINDINGS Anterior circulation: Calcific nonstenotic atheromatous change both carotid bifurcations. ICA termini widely patent. Dominant LEFT anterior cerebral, with diminutive hypoplastic or diseased RIGHT A1 ACA. Both anterior cerebral spill from the LEFT. No M1 or M2 stenosis/occlusion. Posterior circulation: Heavily calcified LEFT vertebral, V4 segment. This vessel is the sole contributor to the basilar which is widely  patent. Diminutive RIGHT vertebral contributes only to PICA which is patent. No cerebellar branch occlusion. No PCA stenosis or occlusion. Venous sinuses: Patent Anatomic variants: Balanced contribution to the LEFT PCA from carotid and basilar. Delayed phase: No abnormal postcontrast enhancement. Old LEFT frontal infarct. Areas of acute infarction are poorly visualized. Review of the MIP images confirms the above findings IMPRESSION: No intracranial mycotic aneurysm or large vessel occlusion related to embolic material. Dominant LEFT vertebral supplies the basilar. Diminutive RIGHT vertebral supplies only PICA. Both are diseased at their origin. Given this anatomy, the observed pattern of acute intracranial infarction affecting the RIGHT occipital lobe and RIGHT inferior cerebellum is most consistent with embolic infarcts, which may or may not be septic. Greater than 50% stenosis due to calcific plaque at the RIGHT carotid bifurcation, but not clearly flow reducing. No dissection. RIGHT upper lobe opacity, and LEFT pleural effusion, incompletely evaluated. CT chest could be helpful in further evaluation. No abnormal postcontrast enhancement of the brain or meninges is visible. Areas of acute infarction or poorly seen however. Electronically Signed   By: Elsie Stain M.D.   On: 01/04/2018 15:09   Dg Chest 2 View  Result Date: 12/28/2017 CLINICAL  DATA:  Altered mental status EXAM: CHEST - 2 VIEW COMPARISON:  07/06/2017 FINDINGS: Post sternotomy changes and valve prosthesis. Low lung volumes. Mild cardiomegaly. Aortic atherosclerosis. No pneumothorax. IMPRESSION: No active cardiopulmonary disease.  Low lung volumes. Electronically Signed   By: Jasmine Pang M.D.   On: 12/28/2017 23:18   Ct Head Wo Contrast  Result Date: 12/29/2017 CLINICAL DATA:  77 y/o M; found in bed covered urine. Altered level of consciousness, unexplained. EXAM: CT HEAD WITHOUT CONTRAST TECHNIQUE: Contiguous axial images were obtained  from the base of the skull through the vertex without intravenous contrast. COMPARISON:  06/04/2017 CT head. FINDINGS: Brain: No evidence of acute infarction, hemorrhage, hydrocephalus, extra-axial collection or mass lesion/mass effect of brain parenchyma. 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 The pituitary mass abuts the optic chiasm and pre chiasmatic optic nerves without significant displacement. Stable small chronic infarction within the left frontal operculum, chronic microvascular ischemic changes of the brain, and volume loss. Vascular: Foci of air present throughout the venous structures of the face and the cavernous sinus, likely after Janik. Calcific atherosclerosis of carotid siphons. No hyperdense vessel identified. Skull: Normal. Negative for fracture or focal lesion. Sinuses/Orbits: Mild mucosal thickening of the maxillary sinuses and the frontal sinuses. Small maxillary sinus mucous retention cyst. Normal aeration of the mastoid air cells. Other: None. IMPRESSION: 1. No acute intracranial abnormality identified. 2. Stable mass within the pituitary fossa with mild mass effect on the optic chiasm and pre chiasmatic optic nerves. 3. Stable small chronic infarction in left frontal operculum, chronic microvascular ischemic changes of the brain, and volume loss. Electronically Signed   By: Mitzi Hansen M.D.   On: 12/29/2017 03:58   Ct Angio Neck W Or Wo Contrast  Result Date: 01/04/2018 CLINICAL DATA:  Focal neuro deficit. Dementia. Elevated white count. Sepsis with toxic metabolic encephalopathy. MRSA. Septic emboli to brain. EXAM: CT ANGIOGRAPHY HEAD AND NECK TECHNIQUE: Multidetector CT imaging of the head and neck was performed using the standard protocol during bolus administration of intravenous contrast. Multiplanar CT image reconstructions and MIPs were obtained to evaluate the vascular anatomy. Carotid stenosis measurements (when  applicable) are obtained utilizing NASCET criteria, using the distal internal carotid diameter as the denominator. CONTRAST:  50mL ISOVUE-370 IOPAMIDOL (ISOVUE-370) INJECTION 76% COMPARISON:  MR brain 01/04/2018. FINDINGS: CTA NECK FINDINGS Aortic arch: Standard branching. Imaged portion shows no evidence of aneurysm or dissection. Heavily calcified great vessel origins without significant stenosis. Right carotid system: Heavily calcified plaque at the bifurcation, extending into the proximal ICA. Luminal measurements of 1.7/3.7 correspond to a stenosis slightly greater than 50%. This is not clearly flow reducing. No ulceration, or dissection. Left carotid system: No evidence of dissection, stenosis (50% or greater) or occlusion. Vertebral arteries: Dominant LEFT vertebral. Heavily calcified ostial stenosis estimated 75%. Diminutive RIGHT vertebral, also with calcification its origin, supplies only PICA. Skeleton: Spondylosis. Poor dentition. No worrisome osseous lesion. Other neck: No masses. Upper chest: RIGHT upper lobe ill-defined opacity could represent pneumonia. Layering fluid along the LEFT major fissure. No pneumothorax. Review of the MIP images confirms the above findings CTA HEAD FINDINGS Anterior circulation: Calcific nonstenotic atheromatous change both carotid bifurcations. ICA termini widely patent. Dominant LEFT anterior cerebral, with diminutive hypoplastic or diseased RIGHT A1 ACA. Both anterior cerebral spill from the LEFT. No M1 or M2 stenosis/occlusion. Posterior circulation: Heavily calcified LEFT vertebral, V4 segment. This vessel is the sole contributor to the basilar which is widely patent. Diminutive RIGHT vertebral  contributes only to PICA which is patent. No cerebellar branch occlusion. No PCA stenosis or occlusion. Venous sinuses: Patent Anatomic variants: Balanced contribution to the LEFT PCA from carotid and basilar. Delayed phase: No abnormal postcontrast enhancement. Old LEFT frontal  infarct. Areas of acute infarction are poorly visualized. Review of the MIP images confirms the above findings IMPRESSION: No intracranial mycotic aneurysm or large vessel occlusion related to embolic material. Dominant LEFT vertebral supplies the basilar. Diminutive RIGHT vertebral supplies only PICA. Both are diseased at their origin. Given this anatomy, the observed pattern of acute intracranial infarction affecting the RIGHT occipital lobe and RIGHT inferior cerebellum is most consistent with embolic infarcts, which may or may not be septic. Greater than 50% stenosis due to calcific plaque at the RIGHT carotid bifurcation, but not clearly flow reducing. No dissection. RIGHT upper lobe opacity, and LEFT pleural effusion, incompletely evaluated. CT chest could be helpful in further evaluation. No abnormal postcontrast enhancement of the brain or meninges is visible. Areas of acute infarction or poorly seen however. Electronically Signed   By: Elsie Stain M.D.   On: 01/04/2018 15:09   Mr Brain Wo Contrast  Result Date: 01/04/2018 CLINICAL DATA:  Altered mental status EXAM: MRI HEAD WITHOUT CONTRAST TECHNIQUE: Multiplanar, multiecho pulse sequences of the brain and surrounding structures were obtained without intravenous contrast. COMPARISON:  Head CT 12/29/2017 FINDINGS: BRAIN: There are multiple punctate foci of abnormal diffusion restriction within the right cerebellum and posterior right occipital lobe. Partially empty sella. There is no acute hemorrhage. There are multiple old cerebellar infarcts and old left frontal lobe infarct. Minimal white matter hyperintensity, nonspecific and commonly seen in asymptomatic patients of this age. Periventricular white matter hyperintensity. Generalized atrophy without lobar predilection. Single focus of chronic microhemorrhage in the right frontal lobe. VASCULAR: Major intracranial arterial and venous sinus flow voids are normal. Right vertebral artery probably  terminates in PICA. SKULL AND UPPER CERVICAL SPINE: Calvarial bone marrow signal is normal. There is no skull base mass. Visualized upper cervical spine and soft tissues are normal. SINUSES/ORBITS: No fluid levels or advanced mucosal thickening. No mastoid or middle ear effusion. The orbits are normal. IMPRESSION: 1. Multiple pole intake foci of acute ischemia within the right cerebellum and right occipital lobe in a pattern most suggestive of embolic etiology. 2. Old left frontal lobe and old bilateral cerebellar infarcts. 3. No hemorrhage or mass effect. Electronically Signed   By: Deatra Robinson M.D.   On: 01/04/2018 01:45   Nm Hepatobiliary Liver Func  Result Date: 12/29/2017 CLINICAL DATA:  Right upper quadrant pain.  Suspected cholecystitis. EXAM: NUCLEAR MEDICINE HEPATOBILIARY IMAGING TECHNIQUE: Sequential images of the abdomen were obtained out to 60 minutes following intravenous administration of radiopharmaceutical. RADIOPHARMACEUTICALS:  5.1 mCi Tc-24m  Choletec IV COMPARISON:  CT 12/29/2017. FINDINGS: Prompt uptake and biliary excretion of activity by the liver is seen. Gallbladder activity is visualized, consistent with patency of cystic duct. Biliary activity passes into small bowel, consistent with patent common bile duct. IMPRESSION: Normal exam.  Gallbladder visualizes normally. Electronically Signed   By: Maisie Fus  Register   On: 12/29/2017 15:04   Ct Abdomen Pelvis W Contrast  Result Date: 12/29/2017 CLINICAL DATA:  Patient found down. Fever and suspected abscess. EXAM: CT ABDOMEN AND PELVIS WITH CONTRAST TECHNIQUE: Multidetector CT imaging of the abdomen and pelvis was performed using the standard protocol following bolus administration of intravenous contrast. CONTRAST:  OMNIPAQUE IOHEXOL 300 MG/ML  SOLN COMPARISON:  None. FINDINGS: Lower chest: Motion artifact  limits examination. Atelectasis in the lung bases. Postoperative changes in the mediastinum. Cardiac enlargement.  Hepatobiliary: No focal liver lesions. Gallbladder wall is thickened and edematous with some stranding in the surrounding fat. This suggest cholecystitis. No stones are visualized. No bile duct dilatation. Pancreas: Fatty infiltration of the pancreas. No focal lesion or inflammatory changes appreciated. Spleen: Normal in size without focal abnormality. Adrenals/Urinary Tract: Adrenal glands are unremarkable. Kidneys are normal, without renal calculi, focal lesion, or hydronephrosis. Bladder is decompressed. Stomach/Bowel: Stomach, small bowel, and colon are not abnormally distended. No wall thickening or inflammatory infiltration are demonstrated. The appendix is normal. Vascular/Lymphatic: Aortic atherosclerosis. No enlarged abdominal or pelvic lymph nodes. Reproductive: Prostate is unremarkable. Other: No free air or free fluid in the abdomen or pelvis. No loculated fluid collections to suggest abscess. Abdominal wall musculature appears intact. Musculoskeletal: Degenerative changes in the lumbar spine. No destructive bone lesions. IMPRESSION: Gallbladder wall thickening and edema with surrounding infiltration suggesting acute cholecystitis. No stones are visualized. No evidence of bowel obstruction or inflammation. No abscess identified. Electronically Signed   By: Burman Nieves M.D.   On: 12/29/2017 01:26   Ct Angio Ao+bifem W & Or Wo Contrast  Result Date: 01/03/2018 CLINICAL DATA:  Mottled appearance of lower extremities. Bacteremia. EXAM: CT ANGIOGRAPHY OF ABDOMINAL AORTA WITH ILIOFEMORAL RUNOFF TECHNIQUE: Multidetector CT imaging of the abdomen, pelvis and lower extremities was performed using the standard protocol during bolus administration of intravenous contrast. Multiplanar CT image reconstructions and MIPs were obtained to evaluate the vascular anatomy. CONTRAST:  ISOVUE-370 IOPAMIDOL (ISOVUE-370) INJECTION 76% COMPARISON:  CT abdomen and pelvis 12/29/2017 FINDINGS: VASCULAR Aorta:  Atherosclerotic calcifications. No aneurysm, dissection or stenosis. Celiac: Calcified plaque at the origin, widely patent SMA: Calcified plaque at the origin, widely patent Renals: Probable significant narrowing at the origin of the right renal artery. Left renal artery widely patent IMA: Widely patent RIGHT Lower Extremity Inflow: Calcified iliac vessels. No aneurysm, dissection or focal stenosis. Outflow: Common femoral artery, superficial femoral artery and profundus femoris widely patent. Runoff: Calcifications in the popliteal artery and tibioperoneal trunk without significant focal stenosis. Trifurcation vessels are patent into the distal calf where they slowly become unopacified, likely related to timing. LEFT Lower Extremity Inflow: Calcifications in the iliac vessels, widely patent. No aneurysm or dissection. Outflow: Widely patent common femoral artery, superficial femoral artery and profundus femoris. Calcified plaque in the distal superficial femoral artery. Runoff: Calcified plaque without significant stenosis in the popliteal artery. Occlusion of the tibioperoneal trunk approximately. Anterior tibial artery remains patent to the ankle. There appears to be reconstitution of the posterior tibial artery in the distal calf. Veins: Grossly unremarkable. Review of the MIP images confirms the above findings. NON-VASCULAR Lower chest: Cardiomegaly. Diffuse coronary artery calcifications. Prior median sternotomy, presumably CABG. Small bilateral pleural effusions with bibasilar atelectasis. Hepatobiliary: No focal hepatic abnormality. Gallbladder unremarkable. Pancreas: No focal abnormality or ductal dilatation. Spleen: No focal abnormality.  Normal size. Adrenals/Urinary Tract: Small bilateral renal cysts. No hydronephrosis. Adrenal glands and urinary bladder unremarkable. Stomach/Bowel: Stomach, large and small bowel grossly unremarkable. Lymphatic: No adenopathy Reproductive: Central prostate  calcifications. Other: No free fluid or free air. Musculoskeletal: No acute bony abnormality. IMPRESSION: VASCULAR Atherosclerotic calcifications in the aorta and iliac vessels without stenosis, aneurysm or dissection. Right lower extremity vessels are patent without significant stenosis. Occlusion of the left tibioperoneal trunk. Dominant runoff in the left calf is the anterior tibial artery. There appears to be reconstitution of the posterior tibial artery in the  distal calf. NON-VASCULAR Small bilateral pleural effusions.  Bibasilar atelectasis. No acute findings in the abdomen or pelvis. Electronically Signed   By: Charlett Nose M.D.   On: 01/03/2018 01:29   Dg Chest Port 1 View  Result Date: 01/01/2018 CLINICAL DATA:  Confusion. EXAM: PORTABLE CHEST 1 VIEW COMPARISON:  12/28/2017 FINDINGS: Prior median sternotomy with aortic valve replacement and left atrial appendage clamp placement. Enlarged cardiac silhouette. Mediastinal contours appear intact for portable technique. Calcific atherosclerotic disease of the aorta. There is no evidence of pneumothorax. Low lung volumes with exaggeration of the interstitial markings. Osseous structures are without acute abnormality. Soft tissues are grossly normal. IMPRESSION: Enlarged cardiac silhouette. Calcific atherosclerotic disease of the aorta. Low lung volumes with secondary exaggeration of the interstitial markings. Electronically Signed   By: Ted Mcalpine M.D.   On: 01/01/2018 10:23     LOS: 7 days   Jeoffrey Massed, MD  [facility]  If 7PM-7AM, please contact night-coverage  Please page via [email or web address]-click on MD name and type text message  01/05/2018, 2:06 PM

## 2018-01-05 NOTE — Progress Notes (Signed)
[facility]    Date of Admission:  12/28/2017   Total days of antibiotics 9           ID: Peter Weiss is a 77 y.o. male with MRSA bacteremia c/b AV endocarditis/perivalvular abscess, CNS septic emboli Principal Problem:   MRSA bacteremia Active Problems:   Type II diabetes mellitus with renal manifestations (HCC)   Aortic stenosis   Atrial fibrillation, chronic   Essential hypertension   Dementia (HCC)   Normocytic anemia   Sepsis (HCC)   Acute cholecystitis   CKD (chronic kidney disease), stage III (HCC)   Chronic diastolic CHF (congestive heart failure) (HCC)   BPH (benign prostatic hyperplasia)   Acute metabolic encephalopathy   Hypokalemia   Elevated troponin   Cerebral embolism with cerebral infarction   Palliative care encounter   DNR (do not resuscitate)    Subjective: Patient reports being thirsty, fatigued, no appetite. But afebrile. He is wanting to talk to his sons.still having fevers, but trending down  Labs show worsening aki plus thrombocytopenia;he was evaluated by CT surgery, felt too high risk for surgery as had expected. Patient seem to understand this fact  Medications:  . metoprolol tartrate  37.5 mg Oral BID  . morphine CONCENTRATE  5 mg Oral Q6H  . sodium chloride flush  3 mL Intravenous Q12H  . tamsulosin  0.4 mg Oral Daily    Objective: Vital signs in last 24 hours: Temp:  [97.8 F (36.6 C)-100.1 F (37.8 C)] 98 F (36.7 C) (10/24 1311) Pulse Rate:  [97-102] 97 (10/24 1311) Resp:  [24-39] 32 (10/24 1311) BP: (99-159)/(48-82) 120/48 (10/24 1311) SpO2:  [98 %-99 %] 99 % (10/24 1311) Physical Exam  Constitutional: He is oriented to person, "who owns this place". He appears chronically ill, pallor,deconditioned. No distress.  HENT:  Mouth/Throat: Oropharynx is clear and moist. No oropharyngeal exudate.  Cardiovascular: Normal rate, regular rhythm and normal heart sounds. +murmur Pulmonary/Chest:  Effort normal and breath sounds normal. No respiratory distress. He has no wheezes.  Abdominal: Soft. Bowel sounds are decreased. mildly distension. There is no tenderness.  Neurological: He is alert and oriented to person, place, and time.  Skin: Skin is warm and dry. No rash noted. No erythema.  Psychiatric: He has a normal mood and affect. His behavior is normal.   Lab Results Recent Labs    01/03/18 0351 01/04/18 0533  WBC  --  13.7*  HGB  --  10.1*  HCT  --  32.7*  NA 140 141  K 4.2 4.3  CL 116* 116*  CO2 20* 16*  BUN 23 22  CREATININE 1.22 1.25*   Liver Panel Recent Labs    01/04/18 0533  PROT 5.0*  ALBUMIN 1.8*  AST 24  ALT 14  ALKPHOS 65  BILITOT 2.3*    Microbiology: 10/18 blood cx ngtd Studies/Results: Ct Angio Head W Or Wo Contrast  Result Date: 01/04/2018 CLINICAL DATA:  Focal neuro deficit. Dementia. Elevated white count. Sepsis with toxic metabolic encephalopathy. MRSA. Septic emboli to brain. EXAM: CT ANGIOGRAPHY HEAD AND NECK TECHNIQUE: Multidetector CT imaging of the head and neck was performed using the standard protocol during bolus administration of intravenous contrast. Multiplanar CT image reconstructions and MIPs were obtained to evaluate the vascular anatomy. Carotid stenosis measurements (when applicable) are obtained utilizing NASCET criteria, using the distal internal carotid diameter as the denominator. CONTRAST:  50mL ISOVUE-370 IOPAMIDOL (ISOVUE-370) INJECTION 76% COMPARISON:  MR brain 01/04/2018. FINDINGS:  CTA NECK FINDINGS Aortic arch: Standard branching. Imaged portion shows no evidence of aneurysm or dissection. Heavily calcified great vessel origins without significant stenosis. Right carotid system: Heavily calcified plaque at the bifurcation, extending into the proximal ICA. Luminal measurements of 1.7/3.7 correspond to a stenosis slightly greater than 50%. This is not clearly flow reducing. No ulceration, or dissection. Left carotid system:  No evidence of dissection, stenosis (50% or greater) or occlusion. Vertebral arteries: Dominant LEFT vertebral. Heavily calcified ostial stenosis estimated 75%. Diminutive RIGHT vertebral, also with calcification its origin, supplies only PICA. Skeleton: Spondylosis. Poor dentition. No worrisome osseous lesion. Other neck: No masses. Upper chest: RIGHT upper lobe ill-defined opacity could represent pneumonia. Layering fluid along the LEFT major fissure. No pneumothorax. Review of the MIP images confirms the above findings CTA HEAD FINDINGS Anterior circulation: Calcific nonstenotic atheromatous change both carotid bifurcations. ICA termini widely patent. Dominant LEFT anterior cerebral, with diminutive hypoplastic or diseased RIGHT A1 ACA. Both anterior cerebral spill from the LEFT. No M1 or M2 stenosis/occlusion. Posterior circulation: Heavily calcified LEFT vertebral, V4 segment. This vessel is the sole contributor to the basilar which is widely patent. Diminutive RIGHT vertebral contributes only to PICA which is patent. No cerebellar branch occlusion. No PCA stenosis or occlusion. Venous sinuses: Patent Anatomic variants: Balanced contribution to the LEFT PCA from carotid and basilar. Delayed phase: No abnormal postcontrast enhancement. Old LEFT frontal infarct. Areas of acute infarction are poorly visualized. Review of the MIP images confirms the above findings IMPRESSION: No intracranial mycotic aneurysm or large vessel occlusion related to embolic material. Dominant LEFT vertebral supplies the basilar. Diminutive RIGHT vertebral supplies only PICA. Both are diseased at their origin. Given this anatomy, the observed pattern of acute intracranial infarction affecting the RIGHT occipital lobe and RIGHT inferior cerebellum is most consistent with embolic infarcts, which may or may not be septic. Greater than 50% stenosis due to calcific plaque at the RIGHT carotid bifurcation, but not clearly flow reducing. No  dissection. RIGHT upper lobe opacity, and LEFT pleural effusion, incompletely evaluated. CT chest could be helpful in further evaluation. No abnormal postcontrast enhancement of the brain or meninges is visible. Areas of acute infarction or poorly seen however. Electronically Signed   By: Elsie Stain M.D.   On: 01/04/2018 15:09   Ct Angio Neck W Or Wo Contrast  Result Date: 01/04/2018 CLINICAL DATA:  Focal neuro deficit. Dementia. Elevated white count. Sepsis with toxic metabolic encephalopathy. MRSA. Septic emboli to brain. EXAM: CT ANGIOGRAPHY HEAD AND NECK TECHNIQUE: Multidetector CT imaging of the head and neck was performed using the standard protocol during bolus administration of intravenous contrast. Multiplanar CT image reconstructions and MIPs were obtained to evaluate the vascular anatomy. Carotid stenosis measurements (when applicable) are obtained utilizing NASCET criteria, using the distal internal carotid diameter as the denominator. CONTRAST:  50mL ISOVUE-370 IOPAMIDOL (ISOVUE-370) INJECTION 76% COMPARISON:  MR brain 01/04/2018. FINDINGS: CTA NECK FINDINGS Aortic arch: Standard branching. Imaged portion shows no evidence of aneurysm or dissection. Heavily calcified great vessel origins without significant stenosis. Right carotid system: Heavily calcified plaque at the bifurcation, extending into the proximal ICA. Luminal measurements of 1.7/3.7 correspond to a stenosis slightly greater than 50%. This is not clearly flow reducing. No ulceration, or dissection. Left carotid system: No evidence of dissection, stenosis (50% or greater) or occlusion. Vertebral arteries: Dominant LEFT vertebral. Heavily calcified ostial stenosis estimated 75%. Diminutive RIGHT vertebral, also with calcification its origin, supplies only PICA. Skeleton: Spondylosis. Poor dentition. No worrisome osseous  lesion. Other neck: No masses. Upper chest: RIGHT upper lobe ill-defined opacity could represent pneumonia. Layering  fluid along the LEFT major fissure. No pneumothorax. Review of the MIP images confirms the above findings CTA HEAD FINDINGS Anterior circulation: Calcific nonstenotic atheromatous change both carotid bifurcations. ICA termini widely patent. Dominant LEFT anterior cerebral, with diminutive hypoplastic or diseased RIGHT A1 ACA. Both anterior cerebral spill from the LEFT. No M1 or M2 stenosis/occlusion. Posterior circulation: Heavily calcified LEFT vertebral, V4 segment. This vessel is the sole contributor to the basilar which is widely patent. Diminutive RIGHT vertebral contributes only to PICA which is patent. No cerebellar branch occlusion. No PCA stenosis or occlusion. Venous sinuses: Patent Anatomic variants: Balanced contribution to the LEFT PCA from carotid and basilar. Delayed phase: No abnormal postcontrast enhancement. Old LEFT frontal infarct. Areas of acute infarction are poorly visualized. Review of the MIP images confirms the above findings IMPRESSION: No intracranial mycotic aneurysm or large vessel occlusion related to embolic material. Dominant LEFT vertebral supplies the basilar. Diminutive RIGHT vertebral supplies only PICA. Both are diseased at their origin. Given this anatomy, the observed pattern of acute intracranial infarction affecting the RIGHT occipital lobe and RIGHT inferior cerebellum is most consistent with embolic infarcts, which may or may not be septic. Greater than 50% stenosis due to calcific plaque at the RIGHT carotid bifurcation, but not clearly flow reducing. No dissection. RIGHT upper lobe opacity, and LEFT pleural effusion, incompletely evaluated. CT chest could be helpful in further evaluation. No abnormal postcontrast enhancement of the brain or meninges is visible. Areas of acute infarction or poorly seen however. Electronically Signed   By: Elsie Stain M.D.   On: 01/04/2018 15:09   Mr Brain Wo Contrast  Result Date: 01/04/2018 CLINICAL DATA:  Altered mental status  EXAM: MRI HEAD WITHOUT CONTRAST TECHNIQUE: Multiplanar, multiecho pulse sequences of the brain and surrounding structures were obtained without intravenous contrast. COMPARISON:  Head CT 12/29/2017 FINDINGS: BRAIN: There are multiple punctate foci of abnormal diffusion restriction within the right cerebellum and posterior right occipital lobe. Partially empty sella. There is no acute hemorrhage. There are multiple old cerebellar infarcts and old left frontal lobe infarct. Minimal white matter hyperintensity, nonspecific and commonly seen in asymptomatic patients of this age. Periventricular white matter hyperintensity. Generalized atrophy without lobar predilection. Single focus of chronic microhemorrhage in the right frontal lobe. VASCULAR: Major intracranial arterial and venous sinus flow voids are normal. Right vertebral artery probably terminates in PICA. SKULL AND UPPER CERVICAL SPINE: Calvarial bone marrow signal is normal. There is no skull base mass. Visualized upper cervical spine and soft tissues are normal. SINUSES/ORBITS: No fluid levels or advanced mucosal thickening. No mastoid or middle ear effusion. The orbits are normal. IMPRESSION: 1. Multiple pole intake foci of acute ischemia within the right cerebellum and right occipital lobe in a pattern most suggestive of embolic etiology. 2. Old left frontal lobe and old bilateral cerebellar infarcts. 3. No hemorrhage or mass effect. Electronically Signed   By: Deatra Robinson M.D.   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 Other process such as HIT, or infection in general.  Thrombocytopenia = will switch to daptomycin 8mg /kg/d. Will need treatment for minimum of 6 wk but unlikely to be sufficient. His perivalvular abscess is high risk for  further invasion to causing  conduction delay for his heart. Overall prognosis is poor  Aki= possibly due to vancomycin. Will d/c vancomycin  Agree with the plan to have dr ghimire and palliative care to address goals of care with patient and his sons today.   [facility] Cell: [phone] Pager: [phone]  01/05/2018, 4:59 PM

## 2018-01-05 NOTE — [place] FL2 (Signed)
[place] MEDICAID FL2 LEVEL OF CARE SCREENING TOOL     IDENTIFICATION  Patient Name: Peter Weiss Birthdate: 1940-06-23 Sex: male Admission Date (Current Location): 12/28/2017  [facility] and [place] Number:  Producer, television/film/video and Address:  [facility], [address], [place], [place] [address]      Provider Number: [id number]  Attending Physician Name and Address:  Maretta Bees, MD  Relative Name and Phone Number:  Claud Kelp, sons, [phone]    Current Level of Care: Hospital Recommended Level of Care: Skilled Nursing Facility Prior Approval Number:    Date Approved/Denied:   PASRR Number: [id number]  Discharge Plan: SNF    Current Diagnoses: Patient Active Problem List   Diagnosis Date Noted  . Cerebral embolism with cerebral infarction 01/04/2018  . MRSA bacteremia 12/30/2017  . Sepsis (HCC) 12/29/2017  . Acute cholecystitis 12/29/2017  . CKD (chronic kidney disease), stage III (HCC) 12/29/2017  . Chronic diastolic CHF (congestive heart failure) (HCC) 12/29/2017  . BPH (benign prostatic hyperplasia) 12/29/2017  . Acute metabolic encephalopathy 12/29/2017  . Hypokalemia 12/29/2017  . Elevated troponin 12/29/2017  . Evaluation by psychiatric service required   . Acute encephalopathy 07/07/2017  . Acute lower UTI 07/07/2017  . Dementia (HCC) 07/07/2017  . Normocytic anemia 07/07/2017  . Multiple falls 12/23/2014  . DDD (degenerative disc disease), lumbar 09/09/2014  . Facet syndrome, lumbar 09/09/2014  . Spinal stenosis, lumbar region, with neurogenic claudication 09/09/2014  . Neuropathy due to secondary diabetes (HCC) 09/09/2014  . Upper GI bleed   . Acute blood loss anemia   . Abdominal aortic aneurysm (HCC)   . Essential hypertension   . Adjustment disorder with disturbance of conduct   . Chronic pain syndrome   . Urinary frequency   . Hemorrhagic shock (HCC) 07/04/2014  . S/P AVR (aortic valve  replacement) 07/04/2014  . Chest pain 07/04/2014  . Gastrointestinal hemorrhage 07/04/2014  . Melena   . Acute kidney injury (HCC)   . Aortic stenosis   . Atrial fibrillation, chronic   . Dental caries   . Pyrexia   . Aortic valve stenosis, critical 04/20/2014  . Syncope and collapse 04/18/2014  . Dehydration 04/18/2014  . Hypotension 04/18/2014  . Lethargic 03/25/2014  . Adjustment disorder with mixed anxiety and depressed mood 03/25/2014  . Chronic back pain 09/28/2013  . Mild cognitive impairment 09/28/2013  . Type II diabetes mellitus with renal manifestations (HCC) 09/28/2013  . HLD (hyperlipidemia) 09/28/2013  . GERD (gastroesophageal reflux disease) 09/28/2013    Orientation RESPIRATION BLADDER Height & Weight     Self, Place  O2(Nasal cannula 2L) Incontinent, External catheter Weight: 114.9 kg Height:  6\' 1"  (185.4 cm)  BEHAVIORAL SYMPTOMS/MOOD NEUROLOGICAL BOWEL NUTRITION STATUS      Incontinent(Rectal pouch) Diet(Please see DC Summary)  AMBULATORY STATUS COMMUNICATION OF NEEDS Skin   Extensive Assist Verbally Normal                       Personal Care Assistance Level of Assistance  Bathing, Feeding, Dressing Bathing Assistance: Maximum assistance Feeding assistance: Maximum assistance Dressing Assistance: Maximum assistance     Functional Limitations Info  Sight, Hearing, Speech Sight Info: Adequate Hearing Info: Impaired Speech Info: Adequate    SPECIAL CARE FACTORS FREQUENCY  PT (By licensed PT), OT (By licensed OT), Speech therapy     PT Frequency: 5x/week OT Frequency: 3x/week     Speech Therapy Frequency: 2x/week  Contractures Contractures Info: Not present    Additional Factors Info  Code Status, Allergies, Insulin Sliding Scale Code Status Info: Full Allergies Info: NKA   Insulin Sliding Scale Info: 3x daily with meals and at bedtime       Current Medications (01/05/2018):  This is the current hospital active medication  list Current Facility-Administered Medications  Medication Dose Route Frequency Provider Last Rate Last Dose  . acetaminophen (TYLENOL) tablet 650 mg  650 mg Oral Q6H PRN Lorretta Harp, MD   650 mg at 01/05/18 1610   Or  . acetaminophen (TYLENOL) suppository 650 mg  650 mg Rectal Q6H PRN Lorretta Harp, MD   650 mg at 01/02/18 2046  . folic acid (FOLVITE) tablet 1 mg  1 mg Oral Daily Hongalgi, Anand D, MD   1 mg at 01/04/18 1015  . hydrALAZINE (APRESOLINE) injection 10 mg  10 mg Intravenous Q4H PRN Elease Etienne, MD   10 mg at 01/01/18 0548  . insulin aspart (novoLOG) injection 0-5 Units  0-5 Units Subcutaneous QHS Hongalgi, Anand D, MD      . insulin aspart (novoLOG) injection 0-9 Units  0-9 Units Subcutaneous TID WC Elease Etienne, MD   2 Units at 01/05/18 0845  . metoprolol tartrate (LOPRESSOR) tablet 37.5 mg  37.5 mg Oral BID Marcellus Scott D, MD   37.5 mg at 01/04/18 2147  . multivitamin with minerals tablet 1 tablet  1 tablet Oral Daily Elease Etienne, MD   1 tablet at 01/04/18 1010  . ondansetron (ZOFRAN) tablet 4 mg  4 mg Oral Q6H PRN Lorretta Harp, MD       Or  . ondansetron [facility]) injection 4 mg  4 mg Intravenous Q6H PRN Lorretta Harp, MD      . rifampin (RIFADIN) 300 mg in sodium chloride 0.9 % 100 mL IVPB  300 mg Intravenous Q12H Judyann Munson, MD 200 mL/hr at 01/04/18 2301 300 mg at 01/04/18 2301  . simvastatin (ZOCOR) tablet 20 mg  20 mg Oral QHS Elease Etienne, MD   20 mg at 01/04/18 2148  . tamsulosin (FLOMAX) capsule 0.4 mg  0.4 mg Oral Daily Marcellus Scott D, MD   0.4 mg at 01/04/18 1013  . thiamine (B-1) injection 100 mg  100 mg Intravenous Daily Marcellus Scott D, MD   100 mg at 01/04/18 1014  . vancomycin (VANCOCIN) IVPB 750 mg/150 ml premix  750 mg Intravenous Q12H Pierce, Dwayne A, RPH 150 mL/hr at 01/05/18 0505 750 mg at 01/05/18 0505  . vitamin B-12 (CYANOCOBALAMIN) tablet 1,000 mcg  1,000 mcg Oral Daily Elease Etienne, MD   1,000 mcg at 01/04/18 1014      Discharge Medications: Please see discharge summary for a list of discharge medications.  Relevant Imaging Results:  Relevant Lab Results:   Additional Information SSN: [id number]  Needs 6 weeks IV Vancomycin  Mearl Latin, LCSW

## 2018-01-05 NOTE — Progress Notes (Signed)
Physical Therapy Treatment Patient Details Name: Peter Weiss MRN: [id number] DOB: May 19, 1940 Today's Date: 01/05/2018    History of Present Illness 77yo male presenting with AMS, uanble to provide history which was taken from EMS who found him in bed in an extended stay hotel covered in urine. Diagnosed with acute cholecystitis, sepsis. PMH aortic stenosis, CHF, DM, syncoe, neuromuscular disorder, AVR, s/p Maze procedure     PT Comments    PT session today focusing on improving independence with functional mobility. Limited to bed level due to lethargy and nursing reports of limited tolerance with sitting EOB. Requires Max A +2 for rolling R <> L for peri care with max cueing for hand placement to assist with mobility. PT recs remain appropriate. PT to continue to follow.   Follow Up Recommendations  SNF     Equipment Recommendations  Other (comment)(defer)    Recommendations for Other Services       Precautions / Restrictions Precautions Precautions: Fall Restrictions Weight Bearing Restrictions: No    Mobility  Bed Mobility Overal bed mobility: Needs Assistance Bed Mobility: Rolling Rolling: Max assist;+2 for physical assistance         General bed mobility comments: Max A +2 for rolling R <> L; little initiation with max cueing for hand placement and how to assist with mobility.  Transfers                    Ambulation/Gait                 Stairs             Wheelchair Mobility    Modified Rankin (Stroke Patients Only)       Balance Overall balance assessment: Needs assistance                                          Cognition Arousal/Alertness: Lethargic Behavior During Therapy: Flat affect Overall Cognitive Status: Impaired/Different from baseline Area of Impairment: Following commands;Memory;Safety/judgement;Problem solving                     Memory: Decreased short-term memory;Decreased  recall of precautions Following Commands: Follows one step commands inconsistently;Follows one step commands with increased time Safety/Judgement: Decreased awareness of safety;Decreased awareness of deficits   Problem Solving: Slow processing;Decreased initiation;Difficulty sequencing;Requires verbal cues;Requires tactile cues        Exercises      General Comments        Pertinent Vitals/Pain Pain Assessment: Faces Faces Pain Scale: Hurts a little bit Pain Location: repeatedly states he feels terrible - unable to state what body region Pain Descriptors / Indicators: Grimacing Pain Intervention(s): Limited activity within patient's tolerance;Monitored during session    Home Living                      Prior Function            PT Goals (current goals can now be found in the care plan section) Acute Rehab PT Goals Patient Stated Goal: none stated PT Goal Formulation: Patient unable to participate in goal setting Progress towards PT goals: Progressing toward goals    Frequency    Min 2X/week      PT Plan Current plan remains appropriate    Co-evaluation  AM-PAC PT "6 Clicks" Daily Activity  Outcome Measure  Difficulty turning over in bed (including adjusting bedclothes, sheets and blankets)?: Unable Difficulty moving from lying on back to sitting on the side of the bed? : Unable Difficulty sitting down on and standing up from a chair with arms (e.g., wheelchair, bedside commode, etc,.)?: Unable Help needed moving to and from a bed to chair (including a wheelchair)?: Total Help needed walking in hospital room?: Total Help needed climbing 3-5 steps with a railing? : Total 6 Click Score: 6    End of Session   Activity Tolerance: Patient limited by lethargy Patient left: in bed;with call bell/phone within reach;with bed alarm set;with nursing/sitter in room Nurse Communication: Mobility status PT Visit Diagnosis: Muscle weakness  (generalized) (M62.81);Difficulty in walking, not elsewhere classified (R26.2)     Time: 5784-6962 PT Time Calculation (min) (ACUTE ONLY): 25 min  Charges:  $Therapeutic Activity: 23-37 mins                     Kipp Laurence, PT, DPT Supplemental Physical Therapist 01/05/18 11:13 AM Pager: [phone] Office: [phone]

## 2018-01-05 NOTE — Consult Note (Signed)
Consultation Note Date: 01/05/2018   Patient Name: Peter Weiss  DOB: 29-Jun-1940  MRN: [id number]  Age / Sex: 77 y.o., male  PCP: Nolene Ebbs, MD Referring Physician: Jonetta Osgood, MD  Reason for Consultation: Establishing goals of care and Psychosocial/spiritual support  HPI/Patient Profile: 77 y.o. male  with past medical history of aortic stenosis s/p AVR, atrial fibrillation, dementia, DM, and neuro-muscular disorder who was admitted on 12/28/2017 with altered mental status.  Work up indicates he has MRSA bacteremia secondary to a peri-valvular abscess.  MR brain demonstrates multiple foci of ischemia infarct.  He is not a surgical candidate for repair at this time.  Clinical Assessment and Goals of Care:  I have reviewed medical records including EPIC notes, labs and imaging, received report from Dr. Sloan Leiter, assessed the patient and then met at the bedside along with his two sons Waunita Schooner and Lanny Hurst  to discuss diagnosis prognosis, GOC, EOL wishes, disposition and options.  I introduced Palliative Medicine as specialized medical care for people living with serious illness. It focuses on providing relief from the symptoms and stress of a serious illness. The goal is to improve quality of life for both the patient and the family.  I asked [name] how he was feeling - "awful, I wish I could visit with my sons and then die".  I asked if he understood what was going on with his body.  "I'm dead".   He reported having no energy and bowel incontinence.  As far as functional and nutritional status he is too weak to perform bed mobility and is not eating.  His albumin is below 2.0.  I explained to his sons that his peri-valvular abscess required surgery to clear the infection and their father is too weak to tolerate surgery.  The patient also shook his head no that he did not want surgery.  We discussed  the new strokes from septic emboli, the prior strokes, and the multitude of calcified vessels.  Finally we discussed his perfuse diarrhea.  On hearing this information the sons seemed to understand that their father would not likely survive this illness.  Given that prognosis, both the sons and the patient were inclined towards being comfortable and transferring to hospice house rather than SNF.   Primary Decision Maker:  PATIENT    SUMMARY OF RECOMMENDATIONS    Transition to comfort measures D/C to hospice house when bed is available.   Son, Waunita Schooner is the point person - contact for Hospice. Will stop antibiotics and start imodium given new onset perfuse diarrhea.  Code Status/Advance Care Planning:  Change to DNR   Symptom Management:   Shift to comfort  Imodium for diarrhea  Morphine for pain in feet as well as dyspnea  Ativan for anxiety  Additional Recommendations (Limitations, Scope, Preferences):  Full Comfort Care  Palliative Prophylaxis:   Delirium Protocol   Prognosis:  Likely days given MRSA bacteremia and peri-aortic valve abscess with septic emboli.  Discharge Planning: Hospice facility      Primary  Diagnoses: Present on Admission: . Atrial fibrillation, chronic . Dementia ([place]) . Type II diabetes mellitus with renal manifestations ([place]) . Normocytic anemia . Essential hypertension . Sepsis ([place]) . Acute cholecystitis . CKD (chronic kidney disease), stage III ([place]) . Chronic diastolic CHF (congestive heart failure) ([place]) . BPH (benign prostatic hyperplasia) . Acute metabolic encephalopathy . Hypokalemia . Elevated troponin   I have reviewed the medical record, interviewed the patient and family, and examined the patient. The following aspects are pertinent.  Past Medical History:  Diagnosis Date  . Aortic stenosis   . Arthritis   . CHF (congestive heart failure) ([place])   . Chicken pox   . Depression   . Diabetes mellitus without  complication ([place])   . Heart murmur   . History of fainting spells of unknown cause   . Hyperlipidemia   . Hypertension   . Neuromuscular disorder ([place])   . Urinary incontinence    Social History   Socioeconomic History  . Marital status: Divorced    Spouse name: Not on file  . Number of children: Not on file  . Years of education: Not on file  . Highest education level: Not on file  Occupational History  . Not on file  Social Needs  . Financial resource strain: Not on file  . Food insecurity:    Worry: Not on file    Inability: Not on file  . Transportation needs:    Medical: Not on file    Non-medical: Not on file  Tobacco Use  . Smoking status: Never Smoker  . Smokeless tobacco: Never Used  Substance and Sexual Activity  . Alcohol use: Yes    Alcohol/week: 0.0 standard drinks  . Drug use: Yes    Types: Marijuana    Comment: once a month  . Sexual activity: Not Currently  Lifestyle  . Physical activity:    Days per week: Not on file    Minutes per session: Not on file  . Stress: Not on file  Relationships  . Social connections:    Talks on phone: Not on file    Gets together: Not on file    Attends religious service: Not on file    Active member of club or organization: Not on file    Attends meetings of clubs or organizations: Not on file    Relationship status: Not on file  Other Topics Concern  . Not on file  Social History Narrative   ** Merged History Encounter **       ** Merged History Encounter **       Family History  Problem Relation Age of Onset  . Alcohol abuse Mother   . Heart disease Mother   . Diabetes Mother   . Heart disease Father   . Diabetes Father   . Cancer Mother   . Cancer Father   . Alcohol abuse Father   . Alcohol abuse Brother    Scheduled Meds: . folic acid  1 mg Oral Daily  . insulin aspart  0-5 Units Subcutaneous QHS  . insulin aspart  0-9 Units Subcutaneous TID WC  . metoprolol tartrate  37.5 mg Oral BID  .  multivitamin with minerals  1 tablet Oral Daily  . simvastatin  20 mg Oral QHS  . tamsulosin  0.4 mg Oral Daily  . thiamine injection  100 mg Intravenous Daily  . vitamin B-12  1,000 mcg Oral Daily   Continuous Infusions: . rifampin (RIFADIN) IVPB 300 mg (01/04/18  2301)  . vancomycin 750 mg (01/05/18 0505)   PRN Meds:.acetaminophen **OR** acetaminophen, hydrALAZINE, ondansetron **OR** ondansetron (ZOFRAN) IV No Known Allergies Review of Systems profound weakness, diffuse pain, bowel incontinence.  Physical Exam  Well developed obese male, very pale complexion, minimal energy.  Awake, alert, cooperative CV difficult to hear Resp no distress on N/C, decreased breath sounds Abdomen obese, diffusely tender Lower extremities - LLE mottled.  Patient requests that I not touch his feet as they are so painful.  Vital Signs: BP 140/70 (BP Location: Left Arm)   Pulse (!) 102   Temp 100.1 F (37.8 C) (Axillary)   Resp (!) 39   Ht 6' 1"  (1.854 m)   Wt 114.9 kg   SpO2 98%   BMI 33.42 kg/m  Pain Scale: 0-10 POSS *See Group Information*: 1-Acceptable,Awake and alert Pain Score: (achy all over)   SpO2: SpO2: 98 % O2 Device:SpO2: 98 % O2 Flow Rate: .O2 Flow Rate (L/min): 2 L/min  IO: Intake/output summary:   Intake/Output Summary (Last 24 hours) at 01/05/2018 0904 Last data filed at 01/05/2018 0820 Gross per 24 hour  Intake 750 ml  Output 1325 ml  Net -575 ml    LBM: Last BM Date: 01/03/18 Baseline Weight: Weight: 114.6 kg Most recent weight: Weight: 114.9 kg     Palliative Assessment/Data: 20%     Time In: 1:00 Time Out: 1:36 Time In:  3:00 Time Out:  4:00 Time Total: 96 min. Greater than 50%  of this time was spent counseling and coordinating care related to the above assessment and plan.  Signed by: Florentina Jenny, PA-C Palliative Medicine Pager: [phone]  Please contact Palliative Medicine Team phone at [phone] for questions and concerns.  For  individual provider: See Shea Evans

## 2018-01-06 DIAGNOSIS — I359 Nonrheumatic aortic valve disorder, unspecified: Secondary | ICD-10-CM

## 2018-01-06 MED ORDER — MORPHINE SULFATE (CONCENTRATE) 10 MG/0.5ML PO SOLN: 5.0000 mg | Freq: Four times a day (QID) | ORAL | Status: AC

## 2018-01-06 MED ORDER — GLYCOPYRROLATE 0.2 MG/ML IJ SOLN: 0.2000 mg | INTRAMUSCULAR | Status: AC | PRN

## 2018-01-06 MED ORDER — LORAZEPAM 2 MG/ML IJ SOLN: 0.5000 mg | INTRAMUSCULAR | 0 refills | Status: AC | PRN

## 2018-01-06 MED ORDER — MORPHINE SULFATE (PF) 2 MG/ML IV SOLN: 2.0000 mg | INTRAVENOUS | 0 refills | Status: AC | PRN

## 2018-01-06 NOTE — Discharge Summary (Signed)
PATIENT DETAILS Name: Peter Weiss Age: 77 y.o. Sex: male Date of Birth: 1940/10/20 MRN: [id number]. Admitting Physician: Lorretta Harp, MD NWG:[id number], Dorma Russell, MD  Admit Date: 12/28/2017 Discharge date: 01/06/2018  Recommendations for Outpatient Follow-up:  1. Optimize comfort care 2. Being discharged to residential hospice.  Admitted From:  Home  Disposition: Residential Hospice     Home Health: No  Equipment/Devices: None  Discharge Condition:  guarded/hospice)  CODE STATUS:  DNR  Diet recommendation:  Regular-COMFORT FEEDINGS  Brief Summary: See H&P, Labs, Consult and Test reports for all details in brief, Patient is a 77 y.o. male with history of bioprosthetic AVR, chronic diastolic heart failure, A. fib not on anticoagulation, dementia presented with sepsis along with acute metabolic encephalopathy-secondary to MRSA bacteremia with a perivalvular abscess around the prosthetic aortic valve along with septic emboli to the brain.  Cardiothoracic surgery did not feel patient was a candidate for redo surgery.  After extensive discussion with the palliative care team-patient was transition to comfort care-plans are to discharge to hospice today.   Brief Hospital Course: Sepsis secondary to MRSA bacteremia with perivalvular abscess surrounding bioprosthetic aortic valve and septic emboli to the brain: Sepsis pathophysiology has improved-continues to have intermittent fever-continue with IV Rocephin and rifampin.  TTE without any obvious vegetations, TEE showed a perivalvular abscess around the bioprosthetic valve.  Seen by cardiothoracic surgery-not a candidate for redo aortic valve replacement.  Managed with IV vancomycin and rifampin, ID was following closely-with plans to change to daptomycin.  Given his overall poor prognosis-an extensive discussion with family by the palliative care team-family has decided to transition him to comfort measures.  Plans are to  discharge to residential hospice today.   Acute metabolic encephalopathy: Likely secondary to above-continues to have waxing and waning confusion.  Does have dementia per outpatient notes.  MRI brain on 10/22 confirms numerous small ischemic foci to the right cerebellum and right occipital lobe-likely reflecting septic emboli.  EEG without any seizures.  Vitamin B12/ammonia levels within normal limits.  See above regarding plans to discharge to residential hospice today.  Acute embolic CVA: Likely septic emboli to the brain in the setting of MRSA bacteremia and aortic valve endocarditis-this was seen on MRI brain on 10/22.  No mycotic aneurysm seen on CT angiogram of the head.  Neurology followed-but plans are to discharge to residential hospice with comfort care  PAF with RVR: Rate controlled-continue metoprolol-not a candidate for anticoagulation per prior notes due to history of GI bleed.  Chads2vasc score is 5.  Elevated troponin: Likely secondary to demand ischemia-no further recommendations from cardiology.  History of bioprosthetic aortic valve replacement: See above-not a candidate for redo aortic valve replacement.  Hypertension: Blood pressure controlled-does not require any antihypertensives on discharge as being transition to full comfort care.   Prolonged QMV:HQIO have a right bundle branch-so not sure if QTC is accurate in this setting.  Monitor periodically.  DM-2: CBG stable-have discontinued SSI-no role to monitor CBGs as full comfort care.  Dyslipidemia: Continue Zocor.  Bilateral leg mottling: CTA without any major stenosis-thought to be secondary to sepsis-no further recommendations from vascular surgery.  Mottling continues but appears mild on my exam.  Anemia: Likely secondary to acute illness-hemoglobin is stable-no need to follow CBC.  Thrombocytopenia: Probably related to sepsis-no need to follow CBC as comfort care.  Dysphagia: Followed by speech  therapy-on dysphagia 1 diet-okay to liberalize to comfort diet if possible.  BPH: Continue Flomax  Stable mass within the pituitary  fossa:Noted on CT head 10/17.  However MRI and brain showed partially empty sella-no further work-up required.  Dementia  Severe debility/deconditionin/failure to thrive syndrome: Likely secondary to acute illness-probably has debility at baseline.  Albumin is 1.8.  Very poor overall prognosis.  After extensive discussion with family-has been transition to comfort care-for residential hospice later today.    Procedures/Studies: 10/23>> TEE:  No evidence of endocarditis. Thickened tissue surrounding aortic valve with heterogeneous echolucency consistent with aortic valve abscess.  10/18>> TTE: - Very technically difficult study. Compared to a prior study in 2016, there are no significant changes. The bioprosthetic aortic valve gradient is stable - no obvious large vegetation was noted on the valve, however, visualization of the valve is limited. Suggest TEE if clinical concern for bioprosthetic valve endocarditis is high.  Discharge Diagnoses:  Principal Problem:   MRSA bacteremia Active Problems:   Type II diabetes mellitus with renal manifestations (HCC)   Aortic stenosis   Atrial fibrillation, chronic   Fever and chills   History of aortic valve replacement   Essential hypertension   Dementia (HCC)   Normocytic anemia   Sepsis (HCC)   Acute cholecystitis   CKD (chronic kidney disease), stage III (HCC)   Chronic diastolic CHF (congestive heart failure) (HCC)   BPH (benign prostatic hyperplasia)   Acute metabolic encephalopathy   Hypokalemia   Elevated troponin   Cerebral embolism with cerebral infarction   Palliative care encounter   DNR (do not resuscitate)   Cerebral embolism   Aortic valve abscess   Discharge Instructions:  Activity:  As tolerated with Full fall precautions use walker/cane & assistance as  needed   Discharge Instructions    Diet general   Complete by:  As directed    Comfort feeds as tolerated   Increase activity slowly   Complete by:  As directed      Allergies as of 01/06/2018   No Known Allergies     Medication List    STOP taking these medications   donepezil 10 MG tablet Commonly known as:  ARICEPT   furosemide 20 MG tablet Commonly known as:  LASIX   gabapentin 400 MG capsule Commonly known as:  NEURONTIN   ibuprofen 200 MG tablet Commonly known as:  ADVIL,MOTRIN   metFORMIN 500 MG tablet Commonly known as:  GLUCOPHAGE   metoprolol tartrate 25 MG tablet Commonly known as:  LOPRESSOR   OVER THE COUNTER MEDICATION   potassium chloride 10 MEQ tablet Commonly known as:  K-DUR   sertraline 50 MG tablet Commonly known as:  ZOLOFT   simvastatin 20 MG tablet Commonly known as:  ZOCOR   tamsulosin 0.4 MG Caps capsule Commonly known as:  FLOMAX   tiZANidine 4 MG tablet Commonly known as:  ZANAFLEX   vitamin B-12 1000 MCG tablet Commonly known as:  CYANOCOBALAMIN   Vitamin D (Ergocalciferol) 50000 units Caps capsule Commonly known as:  DRISDOL     TAKE these medications   glycopyrrolate 0.2 MG/ML injection Commonly known as:  ROBINUL Inject 1 mL (0.2 mg total) into the skin every 4 (four) hours as needed (excessive secretions).   LORazepam 2 MG/ML injection Commonly known as:  ATIVAN Inject 0.25-0.5 mLs (0.5-1 mg total) into the vein every 4 (four) hours as needed for anxiety, seizure or sedation.   morphine CONCENTRATE 10 MG/0.5ML Soln concentrated solution Take 0.25 mLs (5 mg total) by mouth every 6 (six) hours.   morphine 2 MG/ML injection Inject 1-2 mLs (2-4 mg total)  into the vein every hour as needed (or shortness of breath).      Follow-up Information    Fleet Contras, MD. Schedule an appointment as soon as possible for a visit.   Specialty:  Internal Medicine Why:  as needed Contact information: [address] [facility] [address] [facility]  [address] [place] [address] [phone]          No Known Allergies  Consultations: vvs Cards CCS ID Neuro CTVS   Other Procedures/Studies: Ct Angio Head W Or Wo Contrast  Result Date: 01/04/2018 CLINICAL DATA:  Focal neuro deficit. Dementia. Elevated white count. Sepsis with toxic metabolic encephalopathy. MRSA. Septic emboli to brain. EXAM: CT ANGIOGRAPHY HEAD AND NECK TECHNIQUE: Multidetector CT imaging of the head and neck was performed using the standard protocol during bolus administration of intravenous contrast. Multiplanar CT image reconstructions and MIPs were obtained to evaluate the vascular anatomy. Carotid stenosis measurements (when applicable) are obtained utilizing NASCET criteria, using the distal internal carotid diameter as the denominator. CONTRAST:  50mL ISOVUE-370 IOPAMIDOL (ISOVUE-370) INJECTION 76% COMPARISON:  MR brain 01/04/2018. FINDINGS: CTA NECK FINDINGS Aortic arch: Standard branching. Imaged portion shows no evidence of aneurysm or dissection. Heavily calcified great vessel origins without significant stenosis. Right carotid system: Heavily calcified plaque at the bifurcation, extending into the proximal ICA. Luminal measurements of 1.7/3.7 correspond to a stenosis slightly greater than 50%. This is not clearly flow reducing. No ulceration, or dissection. Left carotid system: No evidence of dissection, stenosis (50% or greater) or occlusion. Vertebral arteries: Dominant LEFT vertebral. Heavily calcified ostial stenosis estimated 75%. Diminutive RIGHT vertebral, also with calcification its origin, supplies only PICA. Skeleton: Spondylosis. Poor dentition. No worrisome osseous lesion. Other neck: No masses. Upper chest: RIGHT upper lobe ill-defined opacity could represent pneumonia. Layering fluid along the LEFT major fissure. No pneumothorax. Review of the MIP images confirms the above findings CTA HEAD FINDINGS Anterior circulation: Calcific nonstenotic  atheromatous change both carotid bifurcations. ICA termini widely patent. Dominant LEFT anterior cerebral, with diminutive hypoplastic or diseased RIGHT A1 ACA. Both anterior cerebral spill from the LEFT. No M1 or M2 stenosis/occlusion. Posterior circulation: Heavily calcified LEFT vertebral, V4 segment. This vessel is the sole contributor to the basilar which is widely patent. Diminutive RIGHT vertebral contributes only to PICA which is patent. No cerebellar branch occlusion. No PCA stenosis or occlusion. Venous sinuses: Patent Anatomic variants: Balanced contribution to the LEFT PCA from carotid and basilar. Delayed phase: No abnormal postcontrast enhancement. Old LEFT frontal infarct. Areas of acute infarction are poorly visualized. Review of the MIP images confirms the above findings IMPRESSION: No intracranial mycotic aneurysm or large vessel occlusion related to embolic material. Dominant LEFT vertebral supplies the basilar. Diminutive RIGHT vertebral supplies only PICA. Both are diseased at their origin. Given this anatomy, the observed pattern of acute intracranial infarction affecting the RIGHT occipital lobe and RIGHT inferior cerebellum is most consistent with embolic infarcts, which may or may not be septic. Greater than 50% stenosis due to calcific plaque at the RIGHT carotid bifurcation, but not clearly flow reducing. No dissection. RIGHT upper lobe opacity, and LEFT pleural effusion, incompletely evaluated. CT chest could be helpful in further evaluation. No abnormal postcontrast enhancement of the brain or meninges is visible. Areas of acute infarction or poorly seen however. Electronically Signed   By: Elsie Stain M.D.   On: 01/04/2018 15:09   Dg Chest 2 View  Result Date: 12/28/2017 CLINICAL DATA:  Altered mental status EXAM: CHEST - 2 VIEW COMPARISON:  07/06/2017 FINDINGS: Post sternotomy  changes and valve prosthesis. Low lung volumes. Mild cardiomegaly. Aortic atherosclerosis. No  pneumothorax. IMPRESSION: No active cardiopulmonary disease.  Low lung volumes. Electronically Signed   By: Jasmine Pang M.D.   On: 12/28/2017 23:18   Ct Head Wo Contrast  Result Date: 12/29/2017 CLINICAL DATA:  77 y/o M; found in bed covered urine. Altered level of consciousness, unexplained. EXAM: CT HEAD WITHOUT CONTRAST TECHNIQUE: Contiguous axial images were obtained from the base of the skull through the vertex without intravenous contrast. COMPARISON:  06/04/2017 CT head. FINDINGS: Brain: No evidence of acute infarction, hemorrhage, hydrocephalus, extra-axial collection or mass lesion/mass effect of brain parenchyma. 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 The pituitary mass abuts the optic chiasm and pre chiasmatic optic nerves without significant displacement. Stable small chronic infarction within the left frontal operculum, chronic microvascular ischemic changes of the brain, and volume loss. Vascular: Foci of air present throughout the venous structures of the face and the cavernous sinus, likely after Janik. Calcific atherosclerosis of carotid siphons. No hyperdense vessel identified. Skull: Normal. Negative for fracture or focal lesion. Sinuses/Orbits: Mild mucosal thickening of the maxillary sinuses and the frontal sinuses. Small maxillary sinus mucous retention cyst. Normal aeration of the mastoid air cells. Other: None. IMPRESSION: 1. No acute intracranial abnormality identified. 2. Stable mass within the pituitary fossa with mild mass effect on the optic chiasm and pre chiasmatic optic nerves. 3. Stable small chronic infarction in left frontal operculum, chronic microvascular ischemic changes of the brain, and volume loss. Electronically Signed   By: Mitzi Hansen M.D.   On: 12/29/2017 03:58   Ct Angio Neck W Or Wo Contrast  Result Date: 01/04/2018 CLINICAL DATA:  Focal neuro deficit. Dementia. Elevated white count. Sepsis with toxic  metabolic encephalopathy. MRSA. Septic emboli to brain. EXAM: CT ANGIOGRAPHY HEAD AND NECK TECHNIQUE: Multidetector CT imaging of the head and neck was performed using the standard protocol during bolus administration of intravenous contrast. Multiplanar CT image reconstructions and MIPs were obtained to evaluate the vascular anatomy. Carotid stenosis measurements (when applicable) are obtained utilizing NASCET criteria, using the distal internal carotid diameter as the denominator. CONTRAST:  50mL ISOVUE-370 IOPAMIDOL (ISOVUE-370) INJECTION 76% COMPARISON:  MR brain 01/04/2018. FINDINGS: CTA NECK FINDINGS Aortic arch: Standard branching. Imaged portion shows no evidence of aneurysm or dissection. Heavily calcified great vessel origins without significant stenosis. Right carotid system: Heavily calcified plaque at the bifurcation, extending into the proximal ICA. Luminal measurements of 1.7/3.7 correspond to a stenosis slightly greater than 50%. This is not clearly flow reducing. No ulceration, or dissection. Left carotid system: No evidence of dissection, stenosis (50% or greater) or occlusion. Vertebral arteries: Dominant LEFT vertebral. Heavily calcified ostial stenosis estimated 75%. Diminutive RIGHT vertebral, also with calcification its origin, supplies only PICA. Skeleton: Spondylosis. Poor dentition. No worrisome osseous lesion. Other neck: No masses. Upper chest: RIGHT upper lobe ill-defined opacity could represent pneumonia. Layering fluid along the LEFT major fissure. No pneumothorax. Review of the MIP images confirms the above findings CTA HEAD FINDINGS Anterior circulation: Calcific nonstenotic atheromatous change both carotid bifurcations. ICA termini widely patent. Dominant LEFT anterior cerebral, with diminutive hypoplastic or diseased RIGHT A1 ACA. Both anterior cerebral spill from the LEFT. No M1 or M2 stenosis/occlusion. Posterior circulation: Heavily calcified LEFT vertebral, V4 segment. This  vessel is the sole contributor to the basilar which is widely patent. Diminutive RIGHT vertebral contributes only to PICA which is patent. No cerebellar branch occlusion. No PCA stenosis or  occlusion. Venous sinuses: Patent Anatomic variants: Balanced contribution to the LEFT PCA from carotid and basilar. Delayed phase: No abnormal postcontrast enhancement. Old LEFT frontal infarct. Areas of acute infarction are poorly visualized. Review of the MIP images confirms the above findings IMPRESSION: No intracranial mycotic aneurysm or large vessel occlusion related to embolic material. Dominant LEFT vertebral supplies the basilar. Diminutive RIGHT vertebral supplies only PICA. Both are diseased at their origin. Given this anatomy, the observed pattern of acute intracranial infarction affecting the RIGHT occipital lobe and RIGHT inferior cerebellum is most consistent with embolic infarcts, which may or may not be septic. Greater than 50% stenosis due to calcific plaque at the RIGHT carotid bifurcation, but not clearly flow reducing. No dissection. RIGHT upper lobe opacity, and LEFT pleural effusion, incompletely evaluated. CT chest could be helpful in further evaluation. No abnormal postcontrast enhancement of the brain or meninges is visible. Areas of acute infarction or poorly seen however. Electronically Signed   By: Elsie Stain M.D.   On: 01/04/2018 15:09   Mr Brain Wo Contrast  Result Date: 01/04/2018 CLINICAL DATA:  Altered mental status EXAM: MRI HEAD WITHOUT CONTRAST TECHNIQUE: Multiplanar, multiecho pulse sequences of the brain and surrounding structures were obtained without intravenous contrast. COMPARISON:  Head CT 12/29/2017 FINDINGS: BRAIN: There are multiple punctate foci of abnormal diffusion restriction within the right cerebellum and posterior right occipital lobe. Partially empty sella. There is no acute hemorrhage. There are multiple old cerebellar infarcts and old left frontal lobe infarct.  Minimal white matter hyperintensity, nonspecific and commonly seen in asymptomatic patients of this age. Periventricular white matter hyperintensity. Generalized atrophy without lobar predilection. Single focus of chronic microhemorrhage in the right frontal lobe. VASCULAR: Major intracranial arterial and venous sinus flow voids are normal. Right vertebral artery probably terminates in PICA. SKULL AND UPPER CERVICAL SPINE: Calvarial bone marrow signal is normal. There is no skull base mass. Visualized upper cervical spine and soft tissues are normal. SINUSES/ORBITS: No fluid levels or advanced mucosal thickening. No mastoid or middle ear effusion. The orbits are normal. IMPRESSION: 1. Multiple pole intake foci of acute ischemia within the right cerebellum and right occipital lobe in a pattern most suggestive of embolic etiology. 2. Old left frontal lobe and old bilateral cerebellar infarcts. 3. No hemorrhage or mass effect. Electronically Signed   By: Deatra Robinson M.D.   On: 01/04/2018 01:45   Nm Hepatobiliary Liver Func  Result Date: 12/29/2017 CLINICAL DATA:  Right upper quadrant pain.  Suspected cholecystitis. EXAM: NUCLEAR MEDICINE HEPATOBILIARY IMAGING TECHNIQUE: Sequential images of the abdomen were obtained out to 60 minutes following intravenous administration of radiopharmaceutical. RADIOPHARMACEUTICALS:  5.1 mCi Tc-42m  Choletec IV COMPARISON:  CT 12/29/2017. FINDINGS: Prompt uptake and biliary excretion of activity by the liver is seen. Gallbladder activity is visualized, consistent with patency of cystic duct. Biliary activity passes into small bowel, consistent with patent common bile duct. IMPRESSION: Normal exam.  Gallbladder visualizes normally. Electronically Signed   By: Maisie Fus  Register   On: 12/29/2017 15:04   Ct Abdomen Pelvis W Contrast  Result Date: 12/29/2017 CLINICAL DATA:  Patient found down. Fever and suspected abscess. EXAM: CT ABDOMEN AND PELVIS WITH CONTRAST TECHNIQUE:  Multidetector CT imaging of the abdomen and pelvis was performed using the standard protocol following bolus administration of intravenous contrast. CONTRAST:  OMNIPAQUE IOHEXOL 300 MG/ML  SOLN COMPARISON:  None. FINDINGS: Lower chest: Motion artifact limits examination. Atelectasis in the lung bases. Postoperative changes in the mediastinum. Cardiac enlargement. Hepatobiliary:  No focal liver lesions. Gallbladder wall is thickened and edematous with some stranding in the surrounding fat. This suggest cholecystitis. No stones are visualized. No bile duct dilatation. Pancreas: Fatty infiltration of the pancreas. No focal lesion or inflammatory changes appreciated. Spleen: Normal in size without focal abnormality. Adrenals/Urinary Tract: Adrenal glands are unremarkable. Kidneys are normal, without renal calculi, focal lesion, or hydronephrosis. Bladder is decompressed. Stomach/Bowel: Stomach, small bowel, and colon are not abnormally distended. No wall thickening or inflammatory infiltration are demonstrated. The appendix is normal. Vascular/Lymphatic: Aortic atherosclerosis. No enlarged abdominal or pelvic lymph nodes. Reproductive: Prostate is unremarkable. Other: No free air or free fluid in the abdomen or pelvis. No loculated fluid collections to suggest abscess. Abdominal wall musculature appears intact. Musculoskeletal: Degenerative changes in the lumbar spine. No destructive bone lesions. IMPRESSION: Gallbladder wall thickening and edema with surrounding infiltration suggesting acute cholecystitis. No stones are visualized. No evidence of bowel obstruction or inflammation. No abscess identified. Electronically Signed   By: Burman Nieves M.D.   On: 12/29/2017 01:26   Ct Angio Ao+bifem W & Or Wo Contrast  Result Date: 01/03/2018 CLINICAL DATA:  Mottled appearance of lower extremities. Bacteremia. EXAM: CT ANGIOGRAPHY OF ABDOMINAL AORTA WITH ILIOFEMORAL RUNOFF TECHNIQUE: Multidetector CT imaging of  the abdomen, pelvis and lower extremities was performed using the standard protocol during bolus administration of intravenous contrast. Multiplanar CT image reconstructions and MIPs were obtained to evaluate the vascular anatomy. CONTRAST:  ISOVUE-370 IOPAMIDOL (ISOVUE-370) INJECTION 76% COMPARISON:  CT abdomen and pelvis 12/29/2017 FINDINGS: VASCULAR Aorta: Atherosclerotic calcifications. No aneurysm, dissection or stenosis. Celiac: Calcified plaque at the origin, widely patent SMA: Calcified plaque at the origin, widely patent Renals: Probable significant narrowing at the origin of the right renal artery. Left renal artery widely patent IMA: Widely patent RIGHT Lower Extremity Inflow: Calcified iliac vessels. No aneurysm, dissection or focal stenosis. Outflow: Common femoral artery, superficial femoral artery and profundus femoris widely patent. Runoff: Calcifications in the popliteal artery and tibioperoneal trunk without significant focal stenosis. Trifurcation vessels are patent into the distal calf where they slowly become unopacified, likely related to timing. LEFT Lower Extremity Inflow: Calcifications in the iliac vessels, widely patent. No aneurysm or dissection. Outflow: Widely patent common femoral artery, superficial femoral artery and profundus femoris. Calcified plaque in the distal superficial femoral artery. Runoff: Calcified plaque without significant stenosis in the popliteal artery. Occlusion of the tibioperoneal trunk approximately. Anterior tibial artery remains patent to the ankle. There appears to be reconstitution of the posterior tibial artery in the distal calf. Veins: Grossly unremarkable. Review of the MIP images confirms the above findings. NON-VASCULAR Lower chest: Cardiomegaly. Diffuse coronary artery calcifications. Prior median sternotomy, presumably CABG. Small bilateral pleural effusions with bibasilar atelectasis. Hepatobiliary: No focal hepatic abnormality. Gallbladder  unremarkable. Pancreas: No focal abnormality or ductal dilatation. Spleen: No focal abnormality.  Normal size. Adrenals/Urinary Tract: Small bilateral renal cysts. No hydronephrosis. Adrenal glands and urinary bladder unremarkable. Stomach/Bowel: Stomach, large and small bowel grossly unremarkable. Lymphatic: No adenopathy Reproductive: Central prostate calcifications. Other: No free fluid or free air. Musculoskeletal: No acute bony abnormality. IMPRESSION: VASCULAR Atherosclerotic calcifications in the aorta and iliac vessels without stenosis, aneurysm or dissection. Right lower extremity vessels are patent without significant stenosis. Occlusion of the left tibioperoneal trunk. Dominant runoff in the left calf is the anterior tibial artery. There appears to be reconstitution of the posterior tibial artery in the distal calf. NON-VASCULAR Small bilateral pleural effusions.  Bibasilar atelectasis. No acute findings in the abdomen  or pelvis. Electronically Signed   By: Charlett Nose M.D.   On: 01/03/2018 01:29   Dg Chest Port 1 View  Result Date: 01/01/2018 CLINICAL DATA:  Confusion. EXAM: PORTABLE CHEST 1 VIEW COMPARISON:  12/28/2017 FINDINGS: Prior median sternotomy with aortic valve replacement and left atrial appendage clamp placement. Enlarged cardiac silhouette. Mediastinal contours appear intact for portable technique. Calcific atherosclerotic disease of the aorta. There is no evidence of pneumothorax. Low lung volumes with exaggeration of the interstitial markings. Osseous structures are without acute abnormality. Soft tissues are grossly normal. IMPRESSION: Enlarged cardiac silhouette. Calcific atherosclerotic disease of the aorta. Low lung volumes with secondary exaggeration of the interstitial markings. Electronically Signed   By: Ted Mcalpine M.D.   On: 01/01/2018 10:23      TODAY-DAY OF DISCHARGE:  Subjective:   Keghan Mcfarren today remains lethargic.  Objective:   Blood pressure  (!) 120/48, pulse 96, temperature 98 F (36.7 C), temperature source Axillary, resp. rate (!) 21, height 6\' 1"  (1.854 m), weight 114.9 kg, SpO2 100 %.  Intake/Output Summary (Last 24 hours) at 01/06/2018 1004 Last data filed at 01/05/2018 2300 Gross per 24 hour  Intake 1210 ml  Output 500 ml  Net 710 ml   Filed Weights   12/28/17 2225 12/29/17 1300 01/04/18 1638  Weight: 114.6 kg 115 kg 114.9 kg    Exam: Awake-confused Chest: Transmitted sounds bilaterally CVS: S1-S2-3/6 systolic murmur  PERTINENT RADIOLOGIC STUDIES: Ct Angio Head W Or Wo Contrast  Result Date: 01/04/2018 CLINICAL DATA:  Focal neuro deficit. Dementia. Elevated white count. Sepsis with toxic metabolic encephalopathy. MRSA. Septic emboli to brain. EXAM: CT ANGIOGRAPHY HEAD AND NECK TECHNIQUE: Multidetector CT imaging of the head and neck was performed using the standard protocol during bolus administration of intravenous contrast. Multiplanar CT image reconstructions and MIPs were obtained to evaluate the vascular anatomy. Carotid stenosis measurements (when applicable) are obtained utilizing NASCET criteria, using the distal internal carotid diameter as the denominator. CONTRAST:  50mL ISOVUE-370 IOPAMIDOL (ISOVUE-370) INJECTION 76% COMPARISON:  MR brain 01/04/2018. FINDINGS: CTA NECK FINDINGS Aortic arch: Standard branching. Imaged portion shows no evidence of aneurysm or dissection. Heavily calcified great vessel origins without significant stenosis. Right carotid system: Heavily calcified plaque at the bifurcation, extending into the proximal ICA. Luminal measurements of 1.7/3.7 correspond to a stenosis slightly greater than 50%. This is not clearly flow reducing. No ulceration, or dissection. Left carotid system: No evidence of dissection, stenosis (50% or greater) or occlusion. Vertebral arteries: Dominant LEFT vertebral. Heavily calcified ostial stenosis estimated 75%. Diminutive RIGHT vertebral, also with calcification  its origin, supplies only PICA. Skeleton: Spondylosis. Poor dentition. No worrisome osseous lesion. Other neck: No masses. Upper chest: RIGHT upper lobe ill-defined opacity could represent pneumonia. Layering fluid along the LEFT major fissure. No pneumothorax. Review of the MIP images confirms the above findings CTA HEAD FINDINGS Anterior circulation: Calcific nonstenotic atheromatous change both carotid bifurcations. ICA termini widely patent. Dominant LEFT anterior cerebral, with diminutive hypoplastic or diseased RIGHT A1 ACA. Both anterior cerebral spill from the LEFT. No M1 or M2 stenosis/occlusion. Posterior circulation: Heavily calcified LEFT vertebral, V4 segment. This vessel is the sole contributor to the basilar which is widely patent. Diminutive RIGHT vertebral contributes only to PICA which is patent. No cerebellar branch occlusion. No PCA stenosis or occlusion. Venous sinuses: Patent Anatomic variants: Balanced contribution to the LEFT PCA from carotid and basilar. Delayed phase: No abnormal postcontrast enhancement. Old LEFT frontal infarct. Areas of acute infarction are poorly visualized.  Review of the MIP images confirms the above findings IMPRESSION: No intracranial mycotic aneurysm or large vessel occlusion related to embolic material. Dominant LEFT vertebral supplies the basilar. Diminutive RIGHT vertebral supplies only PICA. Both are diseased at their origin. Given this anatomy, the observed pattern of acute intracranial infarction affecting the RIGHT occipital lobe and RIGHT inferior cerebellum is most consistent with embolic infarcts, which may or may not be septic. Greater than 50% stenosis due to calcific plaque at the RIGHT carotid bifurcation, but not clearly flow reducing. No dissection. RIGHT upper lobe opacity, and LEFT pleural effusion, incompletely evaluated. CT chest could be helpful in further evaluation. No abnormal postcontrast enhancement of the brain or meninges is visible.  Areas of acute infarction or poorly seen however. Electronically Signed   By: Elsie Stain M.D.   On: 01/04/2018 15:09   Dg Chest 2 View  Result Date: 12/28/2017 CLINICAL DATA:  Altered mental status EXAM: CHEST - 2 VIEW COMPARISON:  07/06/2017 FINDINGS: Post sternotomy changes and valve prosthesis. Low lung volumes. Mild cardiomegaly. Aortic atherosclerosis. No pneumothorax. IMPRESSION: No active cardiopulmonary disease.  Low lung volumes. Electronically Signed   By: Jasmine Pang M.D.   On: 12/28/2017 23:18   Ct Head Wo Contrast  Result Date: 12/29/2017 CLINICAL DATA:  77 y/o M; found in bed covered urine. Altered level of consciousness, unexplained. EXAM: CT HEAD WITHOUT CONTRAST TECHNIQUE: Contiguous axial images were obtained from the base of the skull through the vertex without intravenous contrast. COMPARISON:  06/04/2017 CT head. FINDINGS: Brain: No evidence of acute infarction, hemorrhage, hydrocephalus, extra-axial collection or mass lesion/mass effect of brain parenchyma. 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 The pituitary mass abuts the optic chiasm and pre chiasmatic optic nerves without significant displacement. Stable small chronic infarction within the left frontal operculum, chronic microvascular ischemic changes of the brain, and volume loss. Vascular: Foci of air present throughout the venous structures of the face and the cavernous sinus, likely after Janik. Calcific atherosclerosis of carotid siphons. No hyperdense vessel identified. Skull: Normal. Negative for fracture or focal lesion. Sinuses/Orbits: Mild mucosal thickening of the maxillary sinuses and the frontal sinuses. Small maxillary sinus mucous retention cyst. Normal aeration of the mastoid air cells. Other: None. IMPRESSION: 1. No acute intracranial abnormality identified. 2. Stable mass within the pituitary fossa with mild mass effect on the optic chiasm and pre chiasmatic optic  nerves. 3. Stable small chronic infarction in left frontal operculum, chronic microvascular ischemic changes of the brain, and volume loss. Electronically Signed   By: Mitzi Hansen M.D.   On: 12/29/2017 03:58   Ct Angio Neck W Or Wo Contrast  Result Date: 01/04/2018 CLINICAL DATA:  Focal neuro deficit. Dementia. Elevated white count. Sepsis with toxic metabolic encephalopathy. MRSA. Septic emboli to brain. EXAM: CT ANGIOGRAPHY HEAD AND NECK TECHNIQUE: Multidetector CT imaging of the head and neck was performed using the standard protocol during bolus administration of intravenous contrast. Multiplanar CT image reconstructions and MIPs were obtained to evaluate the vascular anatomy. Carotid stenosis measurements (when applicable) are obtained utilizing NASCET criteria, using the distal internal carotid diameter as the denominator. CONTRAST:  50mL ISOVUE-370 IOPAMIDOL (ISOVUE-370) INJECTION 76% COMPARISON:  MR brain 01/04/2018. FINDINGS: CTA NECK FINDINGS Aortic arch: Standard branching. Imaged portion shows no evidence of aneurysm or dissection. Heavily calcified great vessel origins without significant stenosis. Right carotid system: Heavily calcified plaque at the bifurcation, extending into the proximal ICA. Luminal measurements of 1.7/3.7 correspond to a  stenosis slightly greater than 50%. This is not clearly flow reducing. No ulceration, or dissection. Left carotid system: No evidence of dissection, stenosis (50% or greater) or occlusion. Vertebral arteries: Dominant LEFT vertebral. Heavily calcified ostial stenosis estimated 75%. Diminutive RIGHT vertebral, also with calcification its origin, supplies only PICA. Skeleton: Spondylosis. Poor dentition. No worrisome osseous lesion. Other neck: No masses. Upper chest: RIGHT upper lobe ill-defined opacity could represent pneumonia. Layering fluid along the LEFT major fissure. No pneumothorax. Review of the MIP images confirms the above findings CTA  HEAD FINDINGS Anterior circulation: Calcific nonstenotic atheromatous change both carotid bifurcations. ICA termini widely patent. Dominant LEFT anterior cerebral, with diminutive hypoplastic or diseased RIGHT A1 ACA. Both anterior cerebral spill from the LEFT. No M1 or M2 stenosis/occlusion. Posterior circulation: Heavily calcified LEFT vertebral, V4 segment. This vessel is the sole contributor to the basilar which is widely patent. Diminutive RIGHT vertebral contributes only to PICA which is patent. No cerebellar branch occlusion. No PCA stenosis or occlusion. Venous sinuses: Patent Anatomic variants: Balanced contribution to the LEFT PCA from carotid and basilar. Delayed phase: No abnormal postcontrast enhancement. Old LEFT frontal infarct. Areas of acute infarction are poorly visualized. Review of the MIP images confirms the above findings IMPRESSION: No intracranial mycotic aneurysm or large vessel occlusion related to embolic material. Dominant LEFT vertebral supplies the basilar. Diminutive RIGHT vertebral supplies only PICA. Both are diseased at their origin. Given this anatomy, the observed pattern of acute intracranial infarction affecting the RIGHT occipital lobe and RIGHT inferior cerebellum is most consistent with embolic infarcts, which may or may not be septic. Greater than 50% stenosis due to calcific plaque at the RIGHT carotid bifurcation, but not clearly flow reducing. No dissection. RIGHT upper lobe opacity, and LEFT pleural effusion, incompletely evaluated. CT chest could be helpful in further evaluation. No abnormal postcontrast enhancement of the brain or meninges is visible. Areas of acute infarction or poorly seen however. Electronically Signed   By: Elsie Stain M.D.   On: 01/04/2018 15:09   Mr Brain Wo Contrast  Result Date: 01/04/2018 CLINICAL DATA:  Altered mental status EXAM: MRI HEAD WITHOUT CONTRAST TECHNIQUE: Multiplanar, multiecho pulse sequences of the brain and surrounding  structures were obtained without intravenous contrast. COMPARISON:  Head CT 12/29/2017 FINDINGS: BRAIN: There are multiple punctate foci of abnormal diffusion restriction within the right cerebellum and posterior right occipital lobe. Partially empty sella. There is no acute hemorrhage. There are multiple old cerebellar infarcts and old left frontal lobe infarct. Minimal white matter hyperintensity, nonspecific and commonly seen in asymptomatic patients of this age. Periventricular white matter hyperintensity. Generalized atrophy without lobar predilection. Single focus of chronic microhemorrhage in the right frontal lobe. VASCULAR: Major intracranial arterial and venous sinus flow voids are normal. Right vertebral artery probably terminates in PICA. SKULL AND UPPER CERVICAL SPINE: Calvarial bone marrow signal is normal. There is no skull base mass. Visualized upper cervical spine and soft tissues are normal. SINUSES/ORBITS: No fluid levels or advanced mucosal thickening. No mastoid or middle ear effusion. The orbits are normal. IMPRESSION: 1. Multiple pole intake foci of acute ischemia within the right cerebellum and right occipital lobe in a pattern most suggestive of embolic etiology. 2. Old left frontal lobe and old bilateral cerebellar infarcts. 3. No hemorrhage or mass effect. Electronically Signed   By: Deatra Robinson M.D.   On: 01/04/2018 01:45   Nm Hepatobiliary Liver Func  Result Date: 12/29/2017 CLINICAL DATA:  Right upper quadrant pain.  Suspected cholecystitis. EXAM:  NUCLEAR MEDICINE HEPATOBILIARY IMAGING TECHNIQUE: Sequential images of the abdomen were obtained out to 60 minutes following intravenous administration of radiopharmaceutical. RADIOPHARMACEUTICALS:  5.1 mCi Tc-81m  Choletec IV COMPARISON:  CT 12/29/2017. FINDINGS: Prompt uptake and biliary excretion of activity by the liver is seen. Gallbladder activity is visualized, consistent with patency of cystic duct. Biliary activity passes into  small bowel, consistent with patent common bile duct. IMPRESSION: Normal exam.  Gallbladder visualizes normally. Electronically Signed   By: Maisie Fus  Register   On: 12/29/2017 15:04   Ct Abdomen Pelvis W Contrast  Result Date: 12/29/2017 CLINICAL DATA:  Patient found down. Fever and suspected abscess. EXAM: CT ABDOMEN AND PELVIS WITH CONTRAST TECHNIQUE: Multidetector CT imaging of the abdomen and pelvis was performed using the standard protocol following bolus administration of intravenous contrast. CONTRAST:  OMNIPAQUE IOHEXOL 300 MG/ML  SOLN COMPARISON:  None. FINDINGS: Lower chest: Motion artifact limits examination. Atelectasis in the lung bases. Postoperative changes in the mediastinum. Cardiac enlargement. Hepatobiliary: No focal liver lesions. Gallbladder wall is thickened and edematous with some stranding in the surrounding fat. This suggest cholecystitis. No stones are visualized. No bile duct dilatation. Pancreas: Fatty infiltration of the pancreas. No focal lesion or inflammatory changes appreciated. Spleen: Normal in size without focal abnormality. Adrenals/Urinary Tract: Adrenal glands are unremarkable. Kidneys are normal, without renal calculi, focal lesion, or hydronephrosis. Bladder is decompressed. Stomach/Bowel: Stomach, small bowel, and colon are not abnormally distended. No wall thickening or inflammatory infiltration are demonstrated. The appendix is normal. Vascular/Lymphatic: Aortic atherosclerosis. No enlarged abdominal or pelvic lymph nodes. Reproductive: Prostate is unremarkable. Other: No free air or free fluid in the abdomen or pelvis. No loculated fluid collections to suggest abscess. Abdominal wall musculature appears intact. Musculoskeletal: Degenerative changes in the lumbar spine. No destructive bone lesions. IMPRESSION: Gallbladder wall thickening and edema with surrounding infiltration suggesting acute cholecystitis. No stones are visualized. No evidence of bowel  obstruction or inflammation. No abscess identified. Electronically Signed   By: Burman Nieves M.D.   On: 12/29/2017 01:26   Ct Angio Ao+bifem W & Or Wo Contrast  Result Date: 01/03/2018 CLINICAL DATA:  Mottled appearance of lower extremities. Bacteremia. EXAM: CT ANGIOGRAPHY OF ABDOMINAL AORTA WITH ILIOFEMORAL RUNOFF TECHNIQUE: Multidetector CT imaging of the abdomen, pelvis and lower extremities was performed using the standard protocol during bolus administration of intravenous contrast. Multiplanar CT image reconstructions and MIPs were obtained to evaluate the vascular anatomy. CONTRAST:  ISOVUE-370 IOPAMIDOL (ISOVUE-370) INJECTION 76% COMPARISON:  CT abdomen and pelvis 12/29/2017 FINDINGS: VASCULAR Aorta: Atherosclerotic calcifications. No aneurysm, dissection or stenosis. Celiac: Calcified plaque at the origin, widely patent SMA: Calcified plaque at the origin, widely patent Renals: Probable significant narrowing at the origin of the right renal artery. Left renal artery widely patent IMA: Widely patent RIGHT Lower Extremity Inflow: Calcified iliac vessels. No aneurysm, dissection or focal stenosis. Outflow: Common femoral artery, superficial femoral artery and profundus femoris widely patent. Runoff: Calcifications in the popliteal artery and tibioperoneal trunk without significant focal stenosis. Trifurcation vessels are patent into the distal calf where they slowly become unopacified, likely related to timing. LEFT Lower Extremity Inflow: Calcifications in the iliac vessels, widely patent. No aneurysm or dissection. Outflow: Widely patent common femoral artery, superficial femoral artery and profundus femoris. Calcified plaque in the distal superficial femoral artery. Runoff: Calcified plaque without significant stenosis in the popliteal artery. Occlusion of the tibioperoneal trunk approximately. Anterior tibial artery remains patent to the ankle. There appears to be reconstitution of the  posterior tibial artery in the distal calf. Veins: Grossly unremarkable. Review of the MIP images confirms the above findings. NON-VASCULAR Lower chest: Cardiomegaly. Diffuse coronary artery calcifications. Prior median sternotomy, presumably CABG. Small bilateral pleural effusions with bibasilar atelectasis. Hepatobiliary: No focal hepatic abnormality. Gallbladder unremarkable. Pancreas: No focal abnormality or ductal dilatation. Spleen: No focal abnormality.  Normal size. Adrenals/Urinary Tract: Small bilateral renal cysts. No hydronephrosis. Adrenal glands and urinary bladder unremarkable. Stomach/Bowel: Stomach, large and small bowel grossly unremarkable. Lymphatic: No adenopathy Reproductive: Central prostate calcifications. Other: No free fluid or free air. Musculoskeletal: No acute bony abnormality. IMPRESSION: VASCULAR Atherosclerotic calcifications in the aorta and iliac vessels without stenosis, aneurysm or dissection. Right lower extremity vessels are patent without significant stenosis. Occlusion of the left tibioperoneal trunk. Dominant runoff in the left calf is the anterior tibial artery. There appears to be reconstitution of the posterior tibial artery in the distal calf. NON-VASCULAR Small bilateral pleural effusions.  Bibasilar atelectasis. No acute findings in the abdomen or pelvis. Electronically Signed   By: Charlett Nose M.D.   On: 01/03/2018 01:29   Dg Chest Port 1 View  Result Date: 01/01/2018 CLINICAL DATA:  Confusion. EXAM: PORTABLE CHEST 1 VIEW COMPARISON:  12/28/2017 FINDINGS: Prior median sternotomy with aortic valve replacement and left atrial appendage clamp placement. Enlarged cardiac silhouette. Mediastinal contours appear intact for portable technique. Calcific atherosclerotic disease of the aorta. There is no evidence of pneumothorax. Low lung volumes with exaggeration of the interstitial markings. Osseous structures are without acute abnormality. Soft tissues are grossly  normal. IMPRESSION: Enlarged cardiac silhouette. Calcific atherosclerotic disease of the aorta. Low lung volumes with secondary exaggeration of the interstitial markings. Electronically Signed   By: Ted Mcalpine M.D.   On: 01/01/2018 10:23     PERTINENT LAB RESULTS: CBC: Recent Labs    01/04/18 0533  WBC 13.7*  HGB 10.1*  HCT 32.7*  PLT 65*   CMET CMP     Component Value Date/Time   NA 141 01/04/2018 0533   NA 140 03/22/2014 0404   K 4.3 01/04/2018 0533   K 4.1 03/22/2014 0404   CL 116 (H) 01/04/2018 0533   CL 106 03/22/2014 0404   CO2 16 (L) 01/04/2018 0533   CO2 28 03/22/2014 0404   GLUCOSE 151 (H) 01/04/2018 0533   GLUCOSE 138 (H) 03/22/2014 0404   BUN 22 01/04/2018 0533   BUN 26 (H) 03/22/2014 0404   CREATININE 1.25 (H) 01/04/2018 0533   CREATININE 1.21 03/22/2014 0404   CALCIUM 7.5 (L) 01/04/2018 0533   CALCIUM 8.6 03/22/2014 0404   PROT 5.0 (L) 01/04/2018 0533   PROT 6.2 (L) 03/22/2014 0404   ALBUMIN 1.8 (L) 01/04/2018 0533   ALBUMIN 2.9 (L) 03/22/2014 0404   AST 24 01/04/2018 0533   AST 22 03/22/2014 0404   ALT 14 01/04/2018 0533   ALT 20 03/22/2014 0404   ALKPHOS 65 01/04/2018 0533   ALKPHOS 61 03/22/2014 0404   BILITOT 2.3 (H) 01/04/2018 0533   BILITOT 0.3 03/22/2014 0404   GFRNONAA 55 (L) 01/04/2018 0533   GFRNONAA >60 03/22/2014 0404   GFRNONAA >60 09/12/2013 2053   GFRAA >60 01/04/2018 0533   GFRAA >60 03/22/2014 0404   GFRAA >60 09/12/2013 2053    GFR Estimated Creatinine Clearance: 67.8 mL/min (A) (by C-G formula based on SCr of 1.25 mg/dL (H)). No results for input(s): LIPASE, AMYLASE in the last 72 hours. No results for input(s): CKTOTAL, CKMB, CKMBINDEX, TROPONINI in the last 72 hours. Invalid  input(s): POCBNP No results for input(s): DDIMER in the last 72 hours. No results for input(s): HGBA1C in the last 72 hours. No results for input(s): CHOL, HDL, LDLCALC, TRIG, CHOLHDL, LDLDIRECT in the last 72 hours. Recent Labs     01/03/18 1910  TSH 1.536   Recent Labs    01/03/18 1910  VITAMINB12 2,945*   Coags: No results for input(s): INR in the last 72 hours.  Invalid input(s): PT Microbiology: Recent Results (from the past 240 hour(s))  Blood Culture (routine x 2)     Status: Abnormal   Collection Time: 12/28/17 10:32 PM  Result Value Ref Range Status   Specimen Description BLOOD RIGHT ANTECUBITAL  Final   Special Requests   Final    BOTTLES DRAWN AEROBIC AND ANAEROBIC Blood Culture results may not be optimal due to an excessive volume of blood received in culture bottles   Culture  Setup Time   Final    GRAM POSITIVE COCCI IN BOTH AEROBIC AND ANAEROBIC BOTTLES CRITICAL RESULT CALLED TO, READ BACK BY AND VERIFIED WITH: Antoine Primas PharmD 10:45 12/29/17 ([email or web address]) Performed at [facility], [address] [place], [place] [address]    Culture METHICILLIN RESISTANT STAPHYLOCOCCUS AUREUS (A)  Final   Report Status 12/31/2017 FINAL  Final   Organism ID, Bacteria METHICILLIN RESISTANT STAPHYLOCOCCUS AUREUS  Final      Susceptibility   Methicillin resistant staphylococcus aureus - MIC*    CIPROFLOXACIN <=0.5 SENSITIVE Sensitive     ERYTHROMYCIN >=8 RESISTANT Resistant     GENTAMICIN <=0.5 SENSITIVE Sensitive     OXACILLIN RESISTANT Resistant     TETRACYCLINE <=1 SENSITIVE Sensitive     VANCOMYCIN <=0.5 SENSITIVE Sensitive     TRIMETH/SULFA <=10 SENSITIVE Sensitive     CLINDAMYCIN >=8 RESISTANT Resistant     RIFAMPIN <=0.5 SENSITIVE Sensitive     Inducible Clindamycin NEGATIVE Sensitive     * METHICILLIN RESISTANT STAPHYLOCOCCUS AUREUS  Blood Culture ID Panel (Reflexed)     Status: Abnormal   Collection Time: 12/28/17 10:32 PM  Result Value Ref Range Status   Enterococcus species NOT DETECTED NOT DETECTED Final   Listeria monocytogenes NOT DETECTED NOT DETECTED Final   Staphylococcus species DETECTED (A) NOT DETECTED Final    Comment: CRITICAL RESULT CALLED TO, READ BACK BY AND VERIFIED  WITH: Antoine Primas PharmD 10:45 12/29/17 ([email or web address])    Staphylococcus aureus (BCID) DETECTED (A) NOT DETECTED Final    Comment: Methicillin (oxacillin)-resistant Staphylococcus aureus (MRSA). MRSA is predictably resistant to beta-lactam antibiotics (except ceftaroline). Preferred therapy is vancomycin unless clinically contraindicated. Patient requires contact precautions if  hospitalized. CRITICAL RESULT CALLED TO, READ BACK BY AND VERIFIED WITH: Antoine Primas PharmD 10:45 12/29/17 ([email or web address])    Methicillin resistance DETECTED (A) NOT DETECTED Final    Comment: CRITICAL RESULT CALLED TO, READ BACK BY AND VERIFIED WITH: Antoine Primas PharmD 10:45 12/29/17 ([email or web address])    Streptococcus species NOT DETECTED NOT DETECTED Final   Streptococcus agalactiae NOT DETECTED NOT DETECTED Final   Streptococcus pneumoniae NOT DETECTED NOT DETECTED Final   Streptococcus pyogenes NOT DETECTED NOT DETECTED Final   Acinetobacter baumannii NOT DETECTED NOT DETECTED Final   Enterobacteriaceae species NOT DETECTED NOT DETECTED Final   Enterobacter cloacae complex NOT DETECTED NOT DETECTED Final   Escherichia coli NOT DETECTED NOT DETECTED Final   Klebsiella oxytoca NOT DETECTED NOT DETECTED Final   Klebsiella pneumoniae NOT DETECTED NOT DETECTED Final   Proteus species NOT DETECTED NOT DETECTED Final   Serratia marcescens  NOT DETECTED NOT DETECTED Final   Haemophilus influenzae NOT DETECTED NOT DETECTED Final   Neisseria meningitidis NOT DETECTED NOT DETECTED Final   Pseudomonas aeruginosa NOT DETECTED NOT DETECTED Final   Candida albicans NOT DETECTED NOT DETECTED Final   Candida glabrata NOT DETECTED NOT DETECTED Final   Candida krusei NOT DETECTED NOT DETECTED Final   Candida parapsilosis NOT DETECTED NOT DETECTED Final   Candida tropicalis NOT DETECTED NOT DETECTED Final    Comment: Performed at [facility], [address], [place], [place] [address]  Blood Culture (routine x 2)     Status: Abnormal    Collection Time: 12/28/17 11:18 PM  Result Value Ref Range Status   Specimen Description BLOOD RIGHT HAND  Final   Special Requests   Final    BOTTLES DRAWN AEROBIC AND ANAEROBIC Blood Culture results may not be optimal due to an inadequate volume of blood received in culture bottles   Culture  Setup Time   Final    GRAM POSITIVE COCCI IN CLUSTERS IN BOTH AEROBIC AND ANAEROBIC BOTTLES CRITICAL RESULT CALLED TO, READ BACK BY AND VERIFIED WITH: Antoine Primas PharmD 10:45 12/29/17 ([email or web address])    Culture (A)  Final    STAPHYLOCOCCUS AUREUS SUSCEPTIBILITIES PERFORMED ON PREVIOUS CULTURE WITHIN THE LAST 5 DAYS. Performed at [facility], [address], [place], [place] [address]    Report Status 12/31/2017 FINAL  Final  Urine culture     Status: Abnormal   Collection Time: 12/29/17 12:03 AM  Result Value Ref Range Status   Specimen Description URINE, RANDOM  Final   Special Requests   Final    Immunocompromised Performed at [facility], [address] [place], [place] [address]    Culture (A)  Final    20,000 COLONIES/mL ENTEROCOCCUS FAECALIS 10,000 COLONIES/mL STAPHYLOCOCCUS AUREUS METHICILLIN RESISTANT STAPHYLOCOCCUS AUREUS    Report Status 01/01/2018 FINAL  Final   Organism ID, Bacteria ENTEROCOCCUS FAECALIS (A)  Final   Organism ID, Bacteria STAPHYLOCOCCUS AUREUS (A)  Final      Susceptibility   Enterococcus faecalis - MIC*    AMPICILLIN <=2 SENSITIVE Sensitive     LEVOFLOXACIN 1 SENSITIVE Sensitive     NITROFURANTOIN <=16 SENSITIVE Sensitive     VANCOMYCIN 2 SENSITIVE Sensitive     * 20,000 COLONIES/mL ENTEROCOCCUS FAECALIS   Staphylococcus aureus - MIC*    CIPROFLOXACIN <=0.5 SENSITIVE Sensitive     GENTAMICIN <=0.5 SENSITIVE Sensitive     NITROFURANTOIN <=16 SENSITIVE Sensitive     OXACILLIN >=4 RESISTANT Resistant     TETRACYCLINE <=1 SENSITIVE Sensitive     VANCOMYCIN 1 SENSITIVE Sensitive     TRIMETH/SULFA <=10 SENSITIVE Sensitive     CLINDAMYCIN >=8  RESISTANT Resistant     RIFAMPIN <=0.5 SENSITIVE Sensitive     Inducible Clindamycin NEGATIVE Sensitive     * 10,000 COLONIES/mL STAPHYLOCOCCUS AUREUS  Culture, blood (routine x 2)     Status: None   Collection Time: 12/30/17  8:12 AM  Result Value Ref Range Status   Specimen Description BLOOD LEFT ANTECUBITAL  Final   Special Requests   Final    BOTTLES DRAWN AEROBIC ONLY Blood Culture adequate volume   Culture   Final    NO GROWTH 5 DAYS Performed at [facility], [address] [place], [place] [address]    Report Status 01/04/2018 FINAL  Final  Culture, blood (routine x 2)     Status: None   Collection  Time: 12/30/17  8:15 AM  Result Value Ref Range Status   Specimen Description BLOOD BLOOD LEFT HAND  Final   Special Requests   Final    BOTTLES DRAWN AEROBIC ONLY Blood Culture adequate volume   Culture   Final    NO GROWTH 5 DAYS Performed at [facility], [address] [place], [place] [address]    Report Status 01/04/2018 FINAL  Final    FURTHER DISCHARGE INSTRUCTIONS:  Get Medicines reviewed and adjusted: Please take all your medications with you for your next visit with your Primary MD  Laboratory/radiological data: Please request your Primary MD to go over all hospital tests and procedure/radiological results at the follow up, please ask your Primary MD to get all Hospital records sent to his/her office.  In some cases, they will be blood work, cultures and biopsy results pending at the time of your discharge. Please request that your primary care M.D. goes through all the records of your hospital data and follows up on these results.  Also Note the following: If you experience worsening of your admission symptoms, develop shortness of breath, life threatening emergency, suicidal or homicidal thoughts you must seek medical attention immediately by calling 911 or calling your MD immediately  if symptoms less severe.  You must read complete  instructions/literature along with all the possible adverse reactions/side effects for all the Medicines you take and that have been prescribed to you. Take any new Medicines after you have completely understood and accpet all the possible adverse reactions/side effects.   Do not drive when taking Pain medications or sleeping medications (Benzodaizepines)  Do not take more than prescribed Pain, Sleep and Anxiety Medications. It is not advisable to combine anxiety,sleep and pain medications without talking with your primary care practitioner  Special Instructions: If you have smoked or chewed Tobacco  in the last 2 yrs please stop smoking, stop any regular Alcohol  and or any Recreational drug use.  Wear Seat belts while driving.  Please note: You were cared for by a hospitalist during your hospital stay. Once you are discharged, your primary care physician will handle any further medical issues. Please note that NO REFILLS for any discharge medications will be authorized once you are discharged, as it is imperative that you return to your primary care physician (or establish a relationship with a primary care physician if you do not have one) for your post hospital discharge needs so that they can reassess your need for medications and monitor your lab values.  Total Time spent coordinating discharge including counseling, education and face to face time equals 35 minutes.  Signed: Nikole Swartzentruber 01/06/2018 10:04 AM

## 2018-01-06 NOTE — Progress Notes (Signed)
Hospice and [facility])    Received request from [place] for family interest in residential hospice, [facility].  Chart reviewed.  Met with family to confirm interest, explain services and hospice philosophy.  [facility] does have a bed to offer to Peter Weiss on 10/25.  Updated Lanny Hurst and Waunita Schooner (sons) at the bedside, and completed necessary paperwork.  Family agreeable to transfer as soon as possible.  Registration paperwork has been completed and faxed to [facility].  Transport can be arranged as soon as CSW is able to arrange this.  Dr. Orpah Melter to assume care per request of the family.  Please fax discharge summary to [phone]  Nursing staff:  Please call report to [phone], whenever you are ready to.  Thank you, Venia Carbon BSN, [facility] Liaison (listed in [place]) [phone]

## 2018-01-06 NOTE — Progress Notes (Signed)
Patient will DC to: [facility] Anticipated DC date: 01/06/18 Family notified: Sons Transport by: Sharin Mons   Per MD patient ready for DC to [facility]. RN, patient, patient's family, and facility notified of DC. Discharge Summary and FL2 sent to facility. RN to call report prior to discharge [phone]). DC packet on chart. Ambulance transport requested for patient.   CSW will sign off for now as social work intervention is no longer needed. Please consult [place] again if new needs arise.  Cristobal Goldmann, LCSW Clinical Social Worker [phone]

## 2018-01-06 NOTE — Progress Notes (Signed)
Pt given 5mg  Morphine oral @ 1058. This RN wasted the remaining 5 mg with Francis Dowse RN.

## 2018-01-06 NOTE — Progress Notes (Signed)
Pt prepared for d/c to Hospice. IV left in place, clean dry intact. Skin intact except as charted in most recent assessments. Report called to receiving facility. Pt to be transported by ambulance service.

## 2018-01-08 LAB — VITAMIN B1: VITAMIN B1 (THIAMINE): 224.4 nmol/L — AB (ref 66.5–200.0)

## 2018-01-13 DEATH — deceased

## 2018-01-16 ENCOUNTER — Telehealth: Payer: Self-pay | Admitting: Emergency Medicine

## 2018-01-16 NOTE — Telephone Encounter (Signed)
Lost to followup 

## 2020-02-29 IMAGING — DX DG KNEE COMPLETE 4+V*L*
3 series · 4 of 4 positions shown · non-contrast
Comparison: None.

CLINICAL DATA: Knee pain and difficulty walking.

EXAM:
LEFT KNEE - COMPLETE 4+ VIEW

[Series 1: knee ap · 0.14mm/px · 2 of 2 slices shown]
[im 1/2]
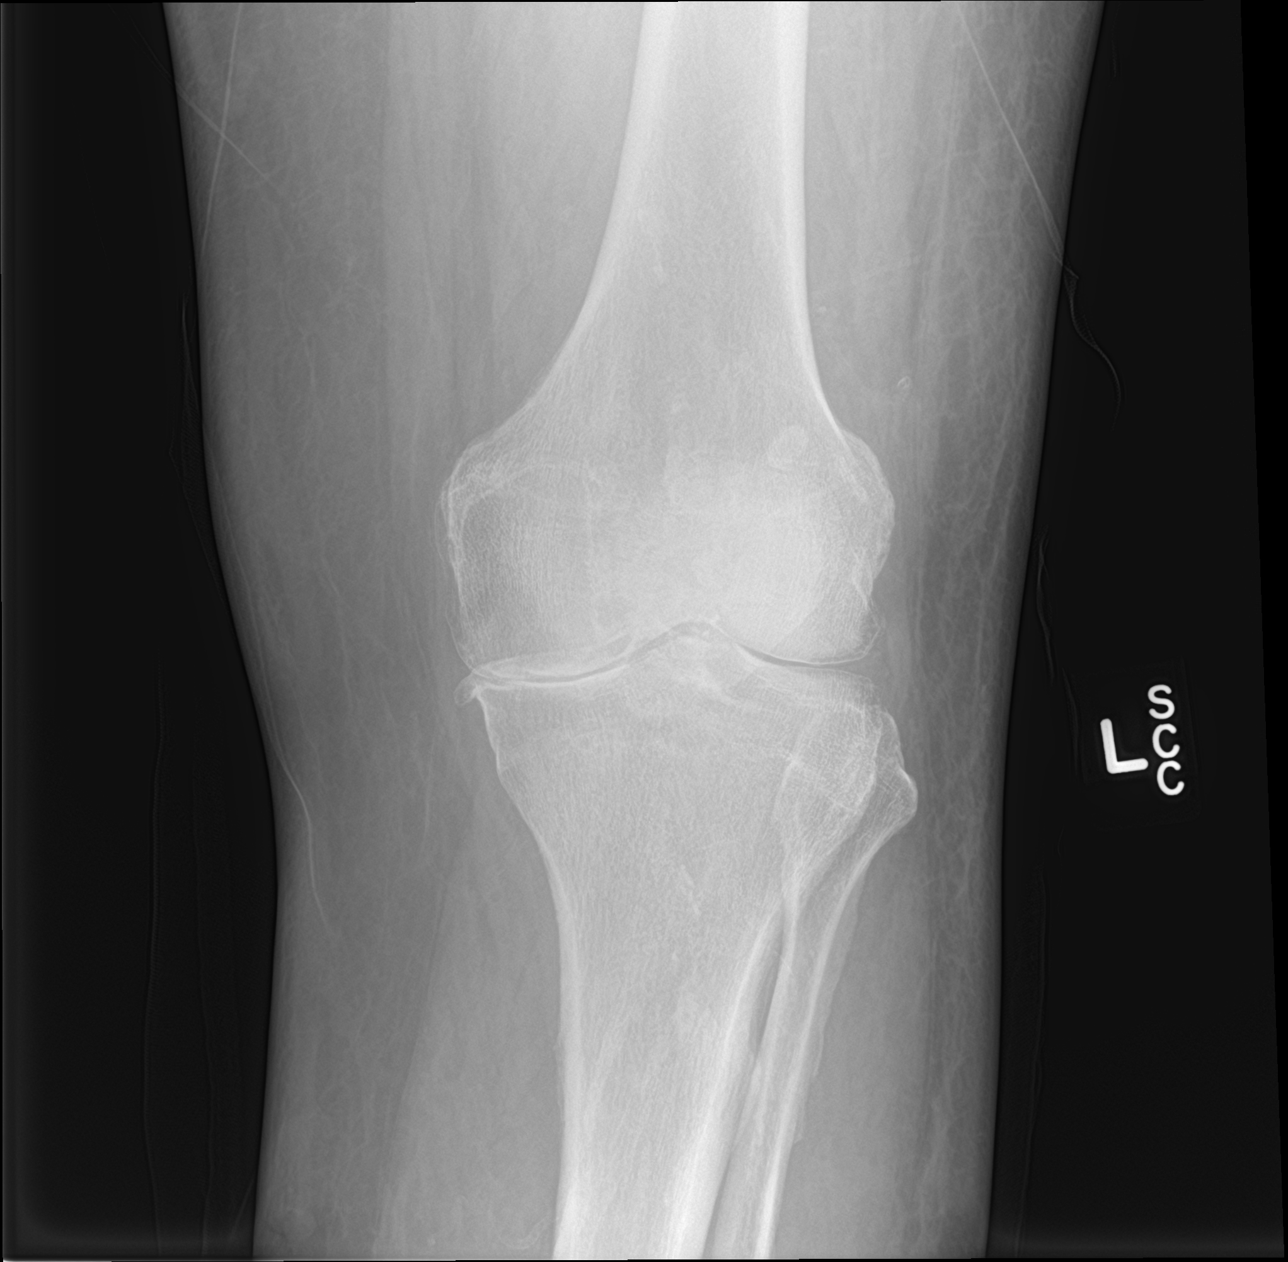
[im 2/2]
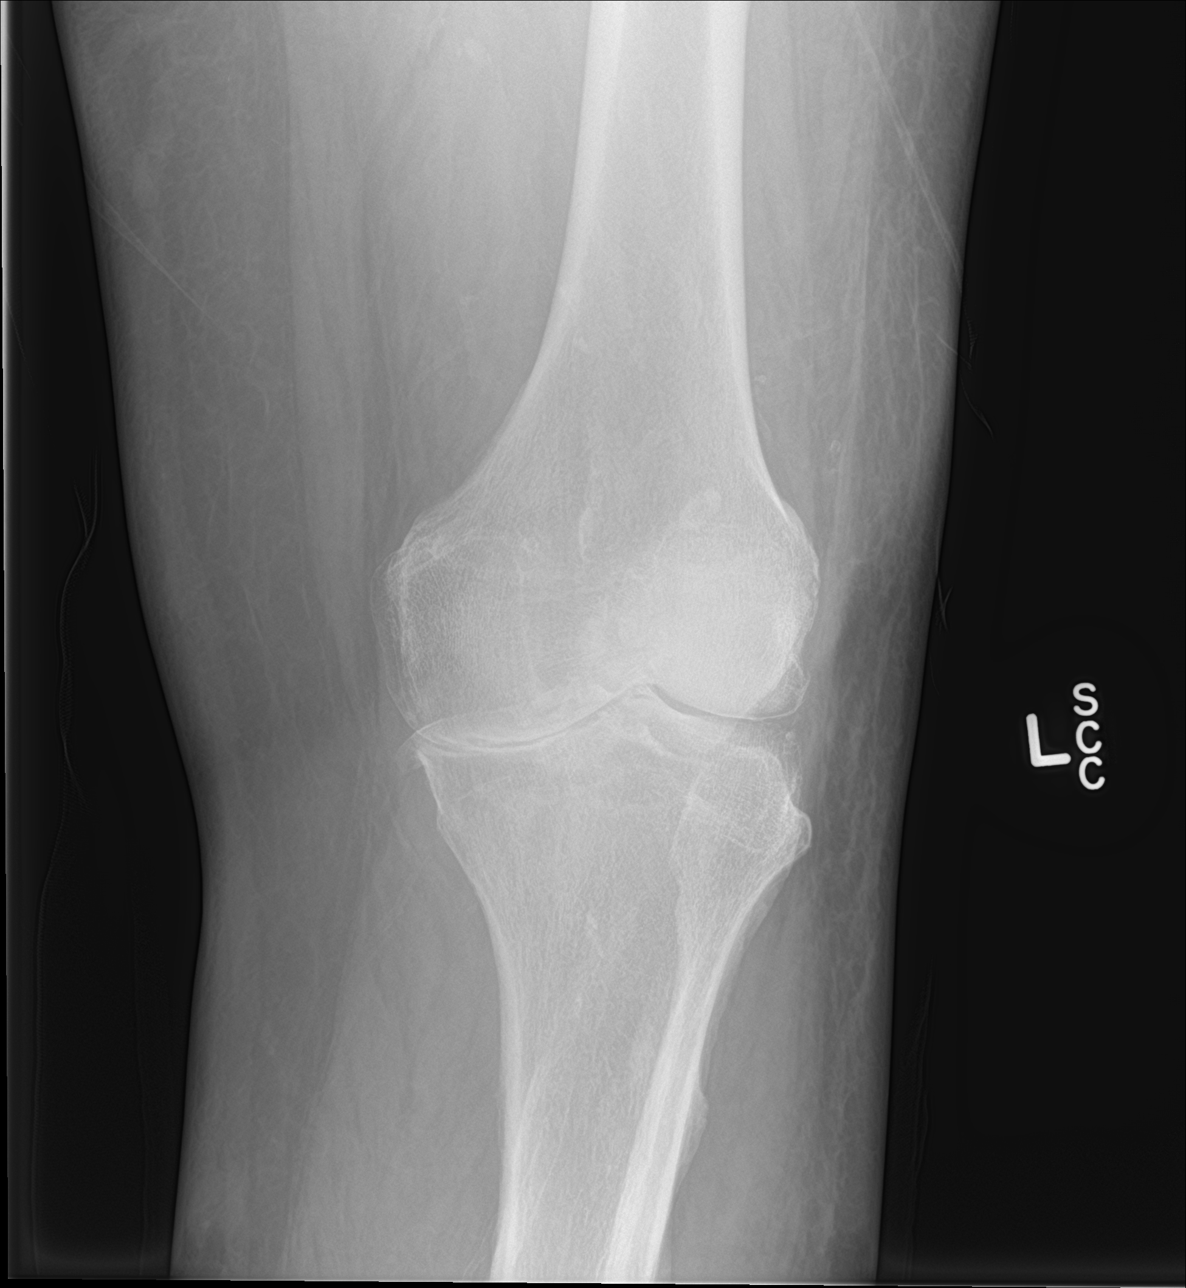

[knee lat]
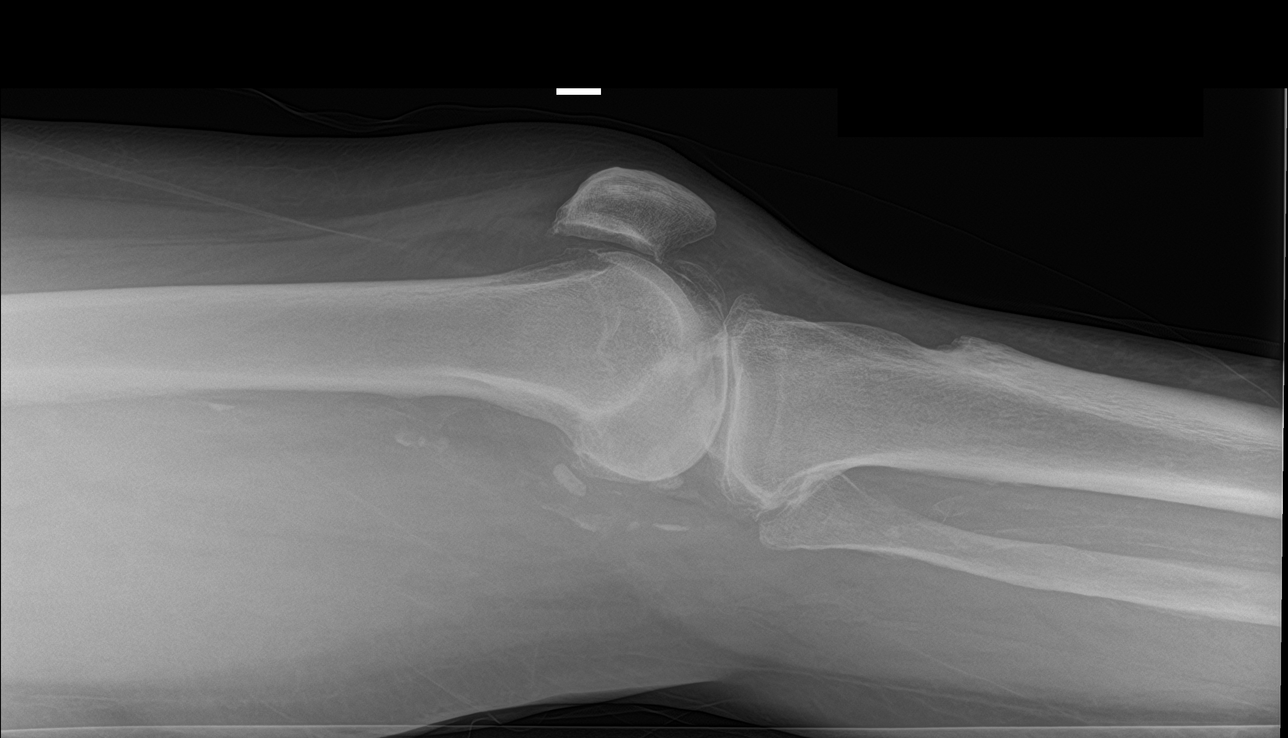

[knee obl]
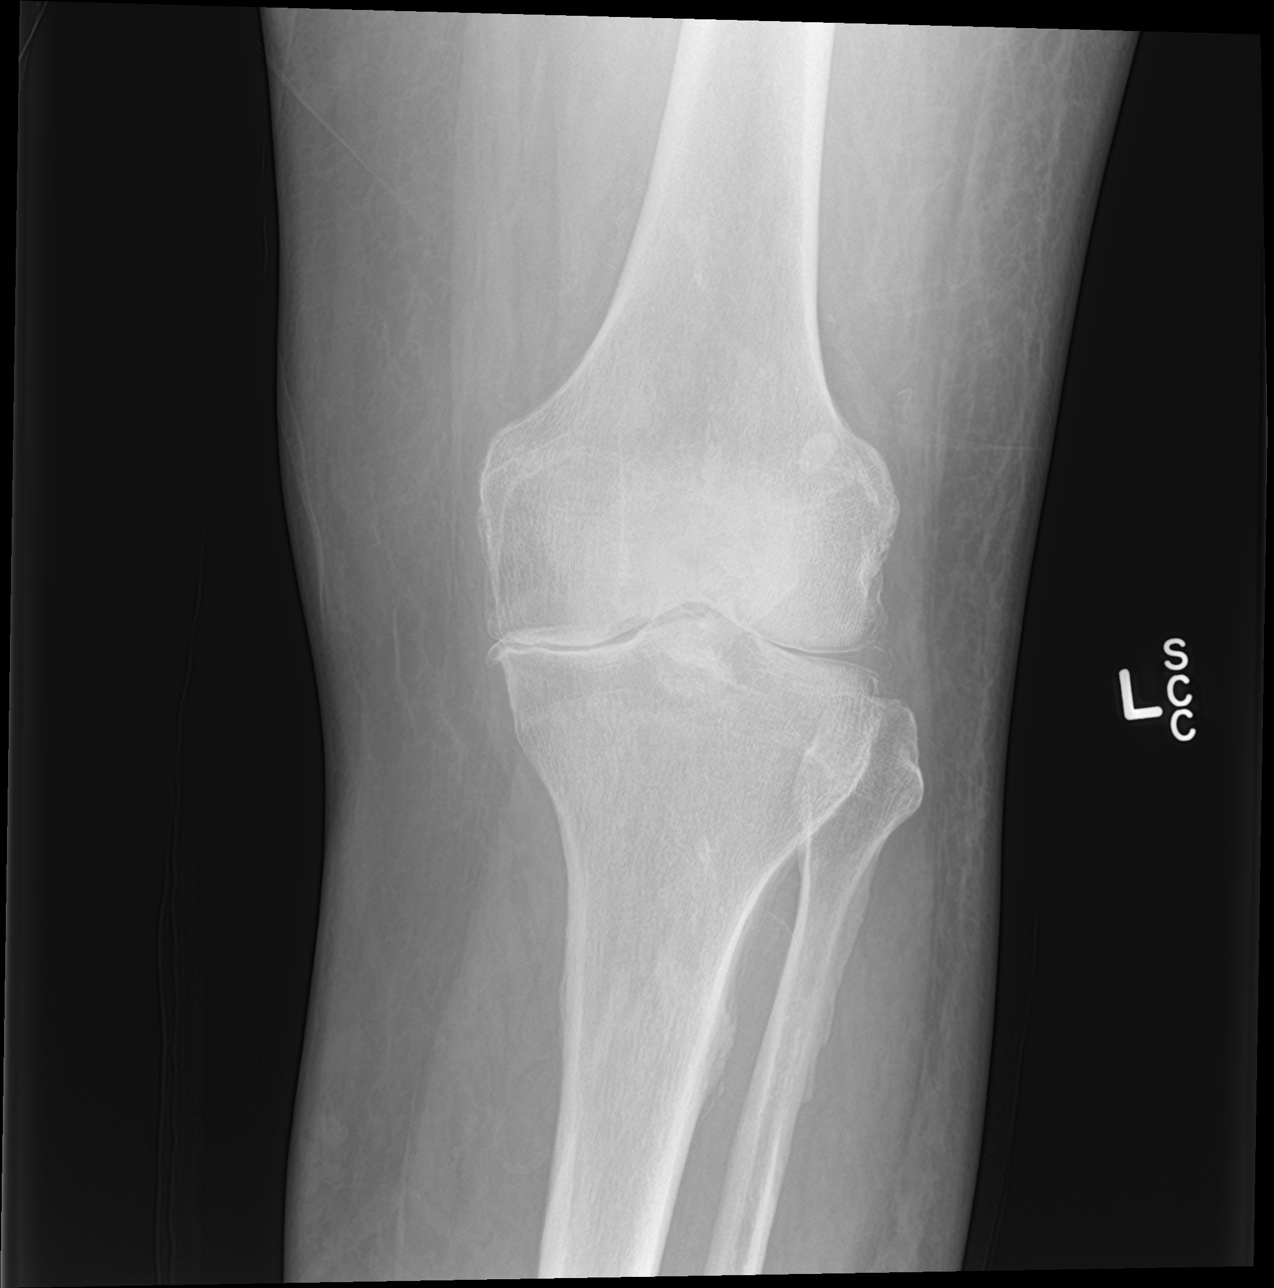

[4 of 4 positions shown; findings below may reference images not displayed]

FINDINGS: Severe joint space loss along the medial knee compartment with
osteophytosis. At least moderate joint space narrowing in the
lateral knee compartment. Spurring at the patellofemoral compartment
of the knee. No large joint effusion. The left knee is located
without fracture.
IMPRESSION: Severe osteoarthritis in left knee.  No acute bone abnormality.
